# Patient Record
Sex: Female | Born: 1935 | Race: White | Hispanic: No | Marital: Married | Smoking: Never smoker
Health system: Southern US, Community
[De-identification: ages and names within clinical notes are randomized; demographics above are authoritative.]

## PROBLEM LIST (undated history)

## (undated) ENCOUNTER — Emergency Department (HOSPITAL_COMMUNITY): Disposition: A | Discharge status: Home / Self Care | Payer: Medicare Other

## (undated) DIAGNOSIS — I10 Essential (primary) hypertension: Secondary | ICD-10-CM

## (undated) DIAGNOSIS — I519 Heart disease, unspecified: Secondary | ICD-10-CM

## (undated) DIAGNOSIS — I639 Cerebral infarction, unspecified: Secondary | ICD-10-CM

## (undated) DIAGNOSIS — K589 Irritable bowel syndrome without diarrhea: Secondary | ICD-10-CM

## (undated) DIAGNOSIS — G459 Transient cerebral ischemic attack, unspecified: Secondary | ICD-10-CM

## (undated) HISTORY — DX: Heart disease, unspecified: I51.9

## (undated) HISTORY — DX: Cerebral infarction, unspecified: I63.9

## (undated) HISTORY — PX: BUNIONECTOMY: SHX129

## (undated) HISTORY — DX: Irritable bowel syndrome, unspecified: K58.9

---

## 1976-04-04 HISTORY — PX: ABDOMINAL HYSTERECTOMY: SHX81

## 1986-04-04 HISTORY — PX: LAPAROSCOPIC SALPINGO OOPHERECTOMY: SHX5927

## 1998-12-10 ENCOUNTER — Other Ambulatory Visit: Admission: RE | Admit: 1998-12-10 | Discharge: 1998-12-10 | Payer: Self-pay | Admitting: Gynecology

## 1999-11-25 ENCOUNTER — Encounter: Payer: Self-pay | Admitting: Gynecology

## 1999-11-25 ENCOUNTER — Encounter: Admission: RE | Admit: 1999-11-25 | Discharge: 1999-11-25 | Payer: Self-pay | Admitting: Gynecology

## 2000-01-28 ENCOUNTER — Other Ambulatory Visit: Admission: RE | Admit: 2000-01-28 | Discharge: 2000-01-28 | Payer: Self-pay | Admitting: Gynecology

## 2000-07-27 ENCOUNTER — Encounter: Payer: Self-pay | Admitting: Physician Assistant

## 2000-11-30 ENCOUNTER — Encounter: Admission: RE | Admit: 2000-11-30 | Discharge: 2000-11-30 | Payer: Self-pay | Admitting: Gynecology

## 2000-11-30 ENCOUNTER — Encounter: Payer: Self-pay | Admitting: Gynecology

## 2002-01-16 ENCOUNTER — Encounter: Payer: Self-pay | Admitting: Gynecology

## 2002-01-16 ENCOUNTER — Encounter: Admission: RE | Admit: 2002-01-16 | Discharge: 2002-01-16 | Payer: Self-pay | Admitting: Gynecology

## 2002-02-15 ENCOUNTER — Other Ambulatory Visit: Admission: RE | Admit: 2002-02-15 | Discharge: 2002-02-15 | Payer: Self-pay | Admitting: Gynecology

## 2003-01-21 ENCOUNTER — Encounter: Payer: Self-pay | Admitting: Gynecology

## 2003-01-21 ENCOUNTER — Encounter: Admission: RE | Admit: 2003-01-21 | Discharge: 2003-01-21 | Payer: Self-pay | Admitting: Gynecology

## 2004-02-13 ENCOUNTER — Encounter: Admission: RE | Admit: 2004-02-13 | Discharge: 2004-02-13 | Payer: Self-pay | Admitting: Gynecology

## 2004-06-10 ENCOUNTER — Other Ambulatory Visit: Admission: RE | Admit: 2004-06-10 | Discharge: 2004-06-10 | Payer: Self-pay | Admitting: Gynecology

## 2005-04-14 ENCOUNTER — Encounter: Admission: RE | Admit: 2005-04-14 | Discharge: 2005-04-14 | Payer: Self-pay | Admitting: Gynecology

## 2006-05-18 ENCOUNTER — Encounter: Admission: RE | Admit: 2006-05-18 | Discharge: 2006-05-18 | Payer: Self-pay | Admitting: Gynecology

## 2006-07-28 ENCOUNTER — Other Ambulatory Visit: Admission: RE | Admit: 2006-07-28 | Discharge: 2006-07-28 | Payer: Self-pay | Admitting: Gynecology

## 2007-05-28 ENCOUNTER — Encounter: Admission: RE | Admit: 2007-05-28 | Discharge: 2007-05-28 | Payer: Self-pay | Admitting: Family Medicine

## 2008-07-17 ENCOUNTER — Encounter: Admission: RE | Admit: 2008-07-17 | Discharge: 2008-07-17 | Payer: Self-pay | Admitting: Gynecology

## 2009-04-27 ENCOUNTER — Inpatient Hospital Stay (HOSPITAL_COMMUNITY): Admission: EM | Admit: 2009-04-27 | Discharge: 2009-04-29 | Payer: Self-pay | Admitting: Emergency Medicine

## 2009-04-28 ENCOUNTER — Encounter (INDEPENDENT_AMBULATORY_CARE_PROVIDER_SITE_OTHER): Payer: Self-pay | Admitting: Internal Medicine

## 2009-04-28 ENCOUNTER — Ambulatory Visit: Payer: Self-pay | Admitting: Vascular Surgery

## 2009-07-21 ENCOUNTER — Encounter: Admission: RE | Admit: 2009-07-21 | Discharge: 2009-07-21 | Payer: Self-pay | Admitting: Gynecology

## 2010-06-21 LAB — CBC
HCT: 35.5 % — ABNORMAL LOW (ref 36.0–46.0)
HCT: 39.9 % (ref 36.0–46.0)
Hemoglobin: 12.1 g/dL (ref 12.0–15.0)
MCHC: 34.1 g/dL (ref 30.0–36.0)
MCHC: 35.1 g/dL (ref 30.0–36.0)
MCV: 90.2 fL (ref 78.0–100.0)
MCV: 90.6 fL (ref 78.0–100.0)
Platelets: 161 10*3/uL (ref 150–400)
Platelets: 201 10*3/uL (ref 150–400)
RBC: 4.43 MIL/uL (ref 3.87–5.11)
RDW: 13.4 % (ref 11.5–15.5)
WBC: 6.3 10*3/uL (ref 4.0–10.5)

## 2010-06-21 LAB — BASIC METABOLIC PANEL
BUN: 15 mg/dL (ref 6–23)
BUN: 17 mg/dL (ref 6–23)
CO2: 27 mEq/L (ref 19–32)
CO2: 30 mEq/L (ref 19–32)
Calcium: 8.4 mg/dL (ref 8.4–10.5)
Calcium: 9.5 mg/dL (ref 8.4–10.5)
Chloride: 103 mEq/L (ref 96–112)
Chloride: 109 mEq/L (ref 96–112)
Creatinine, Ser: 0.87 mg/dL (ref 0.4–1.2)
Creatinine, Ser: 1.17 mg/dL (ref 0.4–1.2)
GFR calc Af Amer: 53 mL/min — ABNORMAL LOW (ref >60)
GFR calc non Af Amer: 59 mL/min — ABNORMAL LOW (ref >60)
GFR calc non Af Amer: >60 mL/min (ref >60)
Glucose, Bld: 95 mg/dL (ref 70–99)
Glucose, Bld: 96 mg/dL (ref 70–99)
Potassium: 3.7 mEq/L (ref 3.5–5.1)
Potassium: 3.8 mEq/L (ref 3.5–5.1)
Potassium: 4 mEq/L (ref 3.5–5.1)
Sodium: 138 mEq/L (ref 135–145)
Sodium: 139 mEq/L (ref 135–145)

## 2010-06-21 LAB — URINALYSIS, ROUTINE W REFLEX MICROSCOPIC
Bilirubin Urine: NEGATIVE
Ketones, ur: NEGATIVE mg/dL
Nitrite: NEGATIVE
Protein, ur: NEGATIVE mg/dL
Urobilinogen, UA: 0.2 mg/dL (ref 0.0–1.0)
pH: 6.5 (ref 5.0–8.0)

## 2010-06-21 LAB — DIFFERENTIAL
Eosinophils Absolute: 0.2 10*3/uL (ref 0.0–0.7)
Eosinophils Relative: 3 % (ref 0–5)
Lymphs Abs: 1.5 10*3/uL (ref 0.7–4.0)
Neutro Abs: 4.4 10*3/uL (ref 1.7–7.7)
Neutrophils Relative %: 65 % (ref 43–77)

## 2010-06-21 LAB — CK TOTAL AND CKMB (NOT AT ARMC)
CK, MB: 1.9 ng/mL (ref 0.3–4.0)
CK, MB: 3.1 ng/mL (ref 0.3–4.0)
Relative Index: 1.8 (ref 0.0–2.5)
Relative Index: 2.9 — ABNORMAL HIGH (ref 0.0–2.5)
Total CK: 101 U/L (ref 7–177)
Total CK: 105 U/L (ref 7–177)
Total CK: 107 U/L (ref 7–177)

## 2010-06-21 LAB — HEPATIC FUNCTION PANEL
Albumin: 3.3 g/dL — ABNORMAL LOW (ref 3.5–5.2)
Alkaline Phosphatase: 46 U/L (ref 39–117)
Indirect Bilirubin: 0.2 mg/dL — ABNORMAL LOW (ref 0.3–0.9)
Total Protein: 5.8 g/dL — ABNORMAL LOW (ref 6.0–8.3)

## 2010-06-21 LAB — POCT CARDIAC MARKERS: Myoglobin, poc: 108 ng/mL (ref 12–200)

## 2010-06-21 LAB — TROPONIN I
Troponin I: 0.01 ng/mL (ref 0.00–0.06)
Troponin I: 0.01 ng/mL (ref 0.00–0.06)
Troponin I: 0.03 ng/mL (ref 0.00–0.06)

## 2010-06-21 LAB — LIPID PANEL
Cholesterol: 144 mg/dL (ref 0–200)
LDL Cholesterol: 66 mg/dL (ref 0–99)
Total CHOL/HDL Ratio: 2.7 RATIO
Triglycerides: 121 mg/dL (ref <150)
VLDL: 24 mg/dL (ref 0–40)

## 2010-06-21 LAB — HEMOGLOBIN A1C: Mean Plasma Glucose: 123 mg/dL

## 2010-06-21 LAB — VITAMIN B12: Vitamin B-12: 164 pg/mL — ABNORMAL LOW (ref 211–911)

## 2010-08-31 ENCOUNTER — Other Ambulatory Visit: Payer: Self-pay | Admitting: Gynecology

## 2010-09-01 ENCOUNTER — Other Ambulatory Visit: Payer: Self-pay | Admitting: Gynecology

## 2010-09-01 DIAGNOSIS — N63 Unspecified lump in unspecified breast: Secondary | ICD-10-CM

## 2010-09-07 ENCOUNTER — Ambulatory Visit
Admission: RE | Admit: 2010-09-07 | Discharge: 2010-09-07 | Disposition: A | Discharge status: Home / Self Care | Payer: Medicare Other | Source: Ambulatory Visit | Attending: Gynecology | Admitting: Gynecology

## 2010-09-07 DIAGNOSIS — N63 Unspecified lump in unspecified breast: Secondary | ICD-10-CM

## 2011-10-10 ENCOUNTER — Other Ambulatory Visit: Payer: Self-pay | Admitting: Gynecology

## 2011-10-10 DIAGNOSIS — Z1231 Encounter for screening mammogram for malignant neoplasm of breast: Secondary | ICD-10-CM

## 2011-10-21 ENCOUNTER — Ambulatory Visit
Admission: RE | Admit: 2011-10-21 | Discharge: 2011-10-21 | Disposition: A | Discharge status: Home / Self Care | Payer: Medicare Other | Source: Ambulatory Visit | Attending: Gynecology | Admitting: Gynecology

## 2011-10-21 DIAGNOSIS — Z1231 Encounter for screening mammogram for malignant neoplasm of breast: Secondary | ICD-10-CM

## 2012-09-17 ENCOUNTER — Other Ambulatory Visit: Payer: Self-pay

## 2012-09-17 DIAGNOSIS — Z1231 Encounter for screening mammogram for malignant neoplasm of breast: Secondary | ICD-10-CM

## 2012-10-29 ENCOUNTER — Ambulatory Visit
Admission: RE | Admit: 2012-10-29 | Discharge: 2012-10-29 | Disposition: A | Discharge status: Home / Self Care | Payer: Medicare Other | Source: Ambulatory Visit

## 2012-10-29 DIAGNOSIS — Z1231 Encounter for screening mammogram for malignant neoplasm of breast: Secondary | ICD-10-CM

## 2013-01-28 ENCOUNTER — Emergency Department (HOSPITAL_COMMUNITY): Payer: Medicare Other

## 2013-01-28 ENCOUNTER — Observation Stay (HOSPITAL_COMMUNITY): Payer: Medicare Other

## 2013-01-28 ENCOUNTER — Observation Stay (HOSPITAL_COMMUNITY)
Admission: EM | Admit: 2013-01-28 | Discharge: 2013-01-30 | Disposition: A | Discharge status: Home / Self Care | Payer: Medicare Other | Attending: Internal Medicine | Admitting: Internal Medicine

## 2013-01-28 ENCOUNTER — Encounter (HOSPITAL_COMMUNITY): Payer: Self-pay | Admitting: Emergency Medicine

## 2013-01-28 DIAGNOSIS — Z8673 Personal history of transient ischemic attack (TIA), and cerebral infarction without residual deficits: Secondary | ICD-10-CM | POA: Insufficient documentation

## 2013-01-28 DIAGNOSIS — Z79899 Other long term (current) drug therapy: Secondary | ICD-10-CM | POA: Insufficient documentation

## 2013-01-28 DIAGNOSIS — E785 Hyperlipidemia, unspecified: Secondary | ICD-10-CM | POA: Diagnosis present

## 2013-01-28 DIAGNOSIS — R200 Anesthesia of skin: Secondary | ICD-10-CM | POA: Diagnosis present

## 2013-01-28 DIAGNOSIS — G459 Transient cerebral ischemic attack, unspecified: Principal | ICD-10-CM | POA: Diagnosis present

## 2013-01-28 DIAGNOSIS — I10 Essential (primary) hypertension: Secondary | ICD-10-CM | POA: Diagnosis present

## 2013-01-28 DIAGNOSIS — R209 Unspecified disturbances of skin sensation: Secondary | ICD-10-CM | POA: Insufficient documentation

## 2013-01-28 DIAGNOSIS — R262 Difficulty in walking, not elsewhere classified: Secondary | ICD-10-CM | POA: Insufficient documentation

## 2013-01-28 HISTORY — DX: Essential (primary) hypertension: I10

## 2013-01-28 HISTORY — DX: Transient cerebral ischemic attack, unspecified: G45.9

## 2013-01-28 LAB — POCT I-STAT TROPONIN I: Troponin i, poc: 0 ng/mL (ref 0.00–0.08)

## 2013-01-28 LAB — PROTIME-INR
INR: 1.02 (ref 0.00–1.49)
Prothrombin Time: 13.2 seconds (ref 11.6–15.2)

## 2013-01-28 LAB — GLUCOSE, CAPILLARY: Glucose-Capillary: 143 mg/dL — ABNORMAL HIGH (ref 70–99)

## 2013-01-28 LAB — COMPREHENSIVE METABOLIC PANEL
ALT: 20 U/L (ref 0–35)
AST: 22 U/L (ref 0–37)
Alkaline Phosphatase: 73 U/L (ref 39–117)
Calcium: 10 mg/dL (ref 8.4–10.5)
Potassium: 4.1 mEq/L (ref 3.5–5.1)
Sodium: 139 mEq/L (ref 135–145)
Total Protein: 7.4 g/dL (ref 6.0–8.3)

## 2013-01-28 LAB — CBC
HCT: 39.1 % (ref 36.0–46.0)
Hemoglobin: 13.4 g/dL (ref 12.0–15.0)
MCH: 30.1 pg (ref 26.0–34.0)
MCHC: 34.3 g/dL (ref 30.0–36.0)
MCV: 90.1 fL (ref 78.0–100.0)
Platelets: 202 10*3/uL (ref 150–400)
Platelets: 170 10*3/uL (ref 150–400)
RBC: 4.65 MIL/uL (ref 3.87–5.11)
RBC: 4.36 MIL/uL (ref 3.87–5.11)
RDW: 13.6 % (ref 11.5–15.5)
WBC: 6.5 10*3/uL (ref 4.0–10.5)

## 2013-01-28 LAB — DIFFERENTIAL
Basophils Absolute: 0 10*3/uL (ref 0.0–0.1)
Basophils Relative: 0 % (ref 0–1)
Eosinophils Absolute: 0.1 10*3/uL (ref 0.0–0.7)
Eosinophils Relative: 2 % (ref 0–5)
Lymphocytes Relative: 27 % (ref 12–46)
Neutrophils Relative %: 61 % (ref 43–77)

## 2013-01-28 LAB — RAPID URINE DRUG SCREEN, HOSP PERFORMED
Barbiturates: NOT DETECTED
Benzodiazepines: NOT DETECTED
Tetrahydrocannabinol: NOT DETECTED

## 2013-01-28 LAB — CREATININE, SERUM
Creatinine, Ser: 0.87 mg/dL (ref 0.50–1.10)
GFR calc Af Amer: 67 mL/min — ABNORMAL LOW (ref >90)
GFR calc non Af Amer: 58 mL/min — ABNORMAL LOW (ref >90)

## 2013-01-28 MED ORDER — GUAIFENESIN ER 600 MG PO TB12
600.0000 mg | ORAL_TABLET | Freq: Two times a day (BID) | ORAL | Status: DC | PRN
  Administered 2013-01-28 – 2013-01-29 (×2): 600 mg via ORAL
  Filled 2013-01-28 (×2): qty 1

## 2013-01-28 MED ORDER — ATORVASTATIN CALCIUM 10 MG PO TABS
10.0000 mg | ORAL_TABLET | Freq: Every day | ORAL | Status: DC
  Administered 2013-01-28 – 2013-01-29 (×2): 10 mg via ORAL
  Filled 2013-01-28 (×3): qty 1

## 2013-01-28 MED ORDER — LORAZEPAM 2 MG/ML IJ SOLN
1.0000 mg | Freq: Once | INTRAMUSCULAR | Status: AC
  Administered 2013-01-28: 1 mg via INTRAVENOUS
  Filled 2013-01-28: qty 1

## 2013-01-28 MED ORDER — CLOPIDOGREL BISULFATE 75 MG PO TABS
75.0000 mg | ORAL_TABLET | Freq: Every day | ORAL | Status: DC
  Administered 2013-01-28: 75 mg via ORAL
  Filled 2013-01-28 (×2): qty 1

## 2013-01-28 MED ORDER — HYDROCHLOROTHIAZIDE 12.5 MG PO CAPS
12.5000 mg | ORAL_CAPSULE | Freq: Every day | ORAL | Status: DC
  Filled 2013-01-28 (×2): qty 1

## 2013-01-28 MED ORDER — VITAMIN D 50 MCG (2000 UT) PO CAPS: 1.0000 | ORAL_CAPSULE | Freq: Every day | ORAL | Status: DC

## 2013-01-28 MED ORDER — VALSARTAN-HYDROCHLOROTHIAZIDE 320-12.5 MG PO TABS: 1.0000 | ORAL_TABLET | Freq: Every day | ORAL | Status: DC

## 2013-01-28 MED ORDER — IRBESARTAN 300 MG PO TABS
300.0000 mg | ORAL_TABLET | Freq: Every day | ORAL | Status: DC
  Filled 2013-01-28 (×2): qty 1

## 2013-01-28 MED ORDER — ENOXAPARIN SODIUM 40 MG/0.4ML ~~LOC~~ SOLN
40.0000 mg | SUBCUTANEOUS | Status: DC
  Administered 2013-01-28 – 2013-01-29 (×2): 40 mg via SUBCUTANEOUS
  Filled 2013-01-28 (×3): qty 0.4

## 2013-01-28 MED ORDER — VITAMIN D3 25 MCG (1000 UNIT) PO TABS
2000.0000 [IU] | ORAL_TABLET | Freq: Every day | ORAL | Status: DC
  Administered 2013-01-28 – 2013-01-29 (×2): 2000 [IU] via ORAL
  Filled 2013-01-28 (×3): qty 2

## 2013-01-28 MED ORDER — LORAZEPAM 0.5 MG PO TABS: 0.5000 mg | ORAL_TABLET | Freq: Two times a day (BID) | ORAL | Status: DC | PRN

## 2013-01-28 NOTE — ED Provider Notes (Signed)
79 year old female woke up with numbness in the right arm and right leg which has gradually faded to the point where she states there is no numbness present whatsoever at this time. She also initially had some difficulty walking which has resolved. She denies any weakness, headache, vertigo, nausea. She has a history of TIA in the past and that TIA involved speech. She denies chest pain, heaviness, tightness, pressure. She had been on aspirin when she had her TIA was switched to Plavix and she has been compliant with taking Plavix. On exam, mental status is normal, cranial nerves are intact, neck is supple without carotid bruits., Lungs are clear, heart has regular rate rhythm. Neurologic exam is unremarkable. Cranial nerves are normal, speech is normal, there no objective sensory deficits, there no objective motor deficits, there is no pronator drift and there is no extinction on double simultaneous stimulation. Gait is normal. She apparently has had a repeat TIA. Old records are reviewed and it has been almost 4 years since her last evaluation. She will need repeat MRI and MRA as well as carotid duplex study. I'm not sure if her echocardiogram will need to be repeated. She will be admitted on the medicine service.  I saw and evaluated the patient, reviewed the resident's note and I agree with the findings and plan.  EKG Interpretation     Ventricular Rate:  59 PR Interval:  136 QRS Duration: 86 QT Interval:  418 QTC Calculation: 413 R Axis:   30 Text Interpretation:  Sinus bradycardia Otherwise normal ECG When compared with ECG of 04/27/2009, No significant change was found              Dione Booze, MD 01/28/13 1700

## 2013-01-28 NOTE — H&P (Signed)
[facility] History and Physical  TERSA FOTOPOULOS Barker:[id number] DOB: [birth year] DOA: 01/28/2013  Referring physician: PCP: Londell Moh, MD  Specialists:   Chief Complaint: Extremity numbness  HPI: Valerie Barker is a 79 y.o. female with a past medical history of hypertension, dyslipidemia, and TIA who was referred to the emergency department today by her primary care provider. She states being in her usual state health, waking up this morning at approximately 5 AM where she noted numbness involving right upper extremity as well as right lower extremity and to a lesser extent left hand. She denies focal weakness, slurred speech, difficulty getting words out, unsteady gait, chest pain, palpitations, syncope presyncope fevers or chills. Patient did report associated lightheadedness. Symptoms gradually resolved over the course of the morning, as she is asymptomatic by the time she reached the emergency department. She was seen at her primary care physician's office this morning who made the recommendation for patient to be evaluated in the ER.                                                                  Review of Systems: The patient denies anorexia, fever, weight loss, vision loss, decreased hearing, hoarseness, chest pain, syncope, dyspnea on exertion, peripheral edema, balance deficits, hemoptysis, abdominal pain, melena, hematochezia, severe indigestion/heartburn, hematuria, incontinence, genital sores, muscle weakness, suspicious skin lesions, transient blindness, difficulty walking, depression, unusual weight change, abnormal bleeding, enlarged lymph nodes, angioedema, and breast masses.    Past Medical History  Diagnosis Date  . Hypertension   . TIA (transient ischemic attack)    History reviewed. No pertinent past surgical history. Social History:  reports that she has never smoked. She does not have any smokeless tobacco history on file. She reports that she does not  drink alcohol or use illicit drugs. Patient is married, resides in a retirement community. She does not drink or smoke. She is the primary caregiver of her husband who has a history of dementia  No Known Allergies  Family History She reports that both parents had strokes, and her mother had recurrent TIAs. Also reports a family history of hypertension  Prior to Admission medications   Medication Sig Start Date End Date Taking? Authorizing Provider  aspirin 325 MG tablet Take 325 mg by mouth daily as needed for pain.   Yes Historical Provider, MD  atorvastatin (LIPITOR) 10 MG tablet Take 10 mg by mouth daily.   Yes Historical Provider, MD  Biotin 5000 MCG CAPS Take 1 capsule by mouth daily.   Yes Historical Provider, MD  Calcium Carb-Cholecalciferol (CALCIUM 600 + D) 600-200 MG-UNIT TABS Take 1 tablet by mouth 2 (two) times daily.   Yes Historical Provider, MD  Cholecalciferol (VITAMIN D) 2000 UNITS CAPS Take 1 capsule by mouth daily.   Yes Historical Provider, MD  clopidogrel (PLAVIX) 75 MG tablet Take 75 mg by mouth daily.   Yes Historical Provider, MD  guaiFENesin (MUCINEX) 600 MG 12 hr tablet Take 1,200 mg by mouth daily as needed for congestion.   Yes Historical Provider, MD  Multiple Vitamin (MULTIVITAMIN) capsule Take 1 capsule by mouth daily.   Yes Historical Provider, MD  valsartan-hydrochlorothiazide (DIOVAN-HCT) 320-12.5 MG per tablet Take 1 tablet by mouth daily.   Yes Historical Provider,  MD  zolpidem (AMBIEN) 10 MG tablet Take 10 mg by mouth at bedtime as needed for sleep.   Yes Historical Provider, MD   Physical Exam: Filed Vitals:   01/28/13 1615  BP: 120/58  Pulse: 62  Temp:   Resp: 17     General:  Patient is in no acute distress she is awake alert and oriented x3, reports resolution to her numbness  Eyes: Pupils are equal round reactive to light extraocular movement intact  Neck: Neck is supple symmetrical no jugular venous distention  Cardiovascular: Regular  rate and rhythm normal S1-S2  Respiratory: Lungs are clear to auscultation bilaterally no wheezing rhonchi oral  Abdomen: Soft nontender nondistended positive bowel  Skin: No rashes or lesions noted  Musculoskeletal: No edema, preserved range of motion function with  Psychiatric: Patient is awake alert and oriented times  Neurologic: She has a nonfocal neurologic examination, I did not note facial droop, tongue deviation, nuchal rigidity. She has 5 out of 5 muscle strength to bilateral upper and lower extremities without alteration to sensation. 2+ bilateral deep tendon reflexes, no cerebellar signs evident.  Labs on Admission:  Basic Metabolic Panel:  Recent Labs Lab 01/28/13 1319  NA 139  K 4.1  CL 102  CO2 26  GLUCOSE 118*  BUN 17  CREATININE 0.95  CALCIUM 10.0   Liver Function Tests:  Recent Labs Lab 01/28/13 1319  AST 22  ALT 20  ALKPHOS 73  BILITOT 0.4  PROT 7.4  ALBUMIN 4.3   No results found for this basename: LIPASE, AMYLASE,  in the last 168 hours No results found for this basename: AMMONIA,  in the last 168 hours CBC:  Recent Labs Lab 01/28/13 1319  WBC 6.5  NEUTROABS 3.9  HGB 14.0  HCT 41.9  MCV 90.1  PLT 202   Cardiac Enzymes:  Recent Labs Lab 01/28/13 1319  TROPONINI <0.30    BNP (last 3 results) No results found for this basename: PROBNP,  in the last 8760 hours CBG:  Recent Labs Lab 01/28/13 1405  GLUCAP 102*    Radiological Exams on Admission: Ct Head (brain) Wo Contrast  01/28/2013   CLINICAL DATA:  Right upper extremity weakness and numbness  EXAM: CT HEAD WITHOUT CONTRAST  TECHNIQUE: Contiguous axial images were obtained from the base of the skull through the vertex without intravenous contrast. Study was obtained within 24 hr of patient's arrival at the emergency department.  COMPARISON:  Brain MRI April 27, 2009 and brain CT April 27, 2009  FINDINGS: There is mild diffuse atrophy. There is no mass, hemorrhage,  extra-axial fluid collection, or midline shift. There is extensive small vessel disease throughout the centra semiovale bilaterally, a stable finding. There is no new gray-white compartment lesion. There is no demonstrable acute infarct. Bony calvarium appears intact. The mastoid air cells are clear.  IMPRESSION: Mild atrophy with extensive supratentorial small vessel disease. No intracranial mass, hemorrhage, or acute appearing infarct.   Electronically Signed   By: Bretta Bang M.D.   On: 01/28/2013 14:42    EKG: Independently reviewed. EKG showing sinus rhythm  Assessment/Plan Active Problems:   TIA (transient ischemic attack)   Dyslipidemia   HTN (hypertension)   Arm numbness   1. Possible TIA. Patient complaining of numbness involving right upper right lower extremity, however did mention having numbness involving her left hand as well which would be little more unusual. Will admit her to telemetry, place her on the TIA protocol. Start Plavix any 75  mg by mouth daily. Continue statin therapy, check a fasting lipid panel. Obtain MRI, MRA, 2-D echo and carotid Dopplers. 2. Hypertension. Blood pressures are stable, we'll continue her home regimen with Diovan/hydrochlorothiazide 320/12.5. She had a blood pressure 120/58 in the emergency department. 3. Dyslipidemia. Continue statin therapy, check a fasting lipid panel 4. DVT prophylaxis. Lovenox   Code Status: Full code Family Communication: Plan discussed with her son who was present at bedside Disposition Plan: Place patient in 29 observation, symptoms resolving by the time she reached the emergency department, I do not anticipate her requiring greater than 2 night hospitalization  Time spent: 60 minutes  Jeralyn Bennett [facility] Pager [phone]  If 7PM-7AM, please contact night-coverage [email or web address] Password [facility] 01/28/2013, 5:16 PM

## 2013-01-28 NOTE — ED Notes (Signed)
Pt sent from PCP woke up this am with right arm numbness and pain that is somewhat improved but some tingling still present; pt sts went into right leg and some left hand numbness also; pt sts feels "funny" in head but denies pain

## 2013-01-28 NOTE — ED Notes (Signed)
RN stroke swallow screen completed at 1433.

## 2013-01-28 NOTE — ED Provider Notes (Signed)
CSN: [id number]     Arrival date & time 01/28/13  1307 History   First MD Initiated Contact with Patient 01/28/13 1459     Chief Complaint  Patient presents with  . Numbness    The history is provided by the patient. No language interpreter was used.   Ms. Spadoni is a 79 y.o. Caucasian female with past medical history of TIA approximately 4 years ago who comes emergency department today with numbness of her right side. She woke this morning at 5:30 AM with numbness to right arm which then progressed to include her right leg. She felt that it was just from sleeping is result she did not seek evaluation at that time period of approximately 2 hours she called 911. At that time her vital signs were normal.  She was advised to the emergency department however she refused. The numbness persisted as a result she went to her primary care doctor around 11:30 this afternoon. At that time the numbness had resolved however he was concerned history of TIA that she should be evaluated in the ED.  In addition to the numbness Ms. Sok had some dizziness. She stated that she attempted to walk at that time and was unable to walk in a straight line. She had no weakness. Symptoms have since resolved.   Past Medical History  Diagnosis Date  . Hypertension   . TIA (transient ischemic attack)    History reviewed. No pertinent past surgical history. History reviewed. No pertinent family history. History  Substance Use Topics  . Smoking status: Never Smoker   . Smokeless tobacco: Not on file  . Alcohol Use: No   OB History   Grav Para Term Preterm Abortions TAB SAB Ect Mult Living                 Review of Systems  Constitutional: Negative for fever and chills.  Respiratory: Negative for cough and shortness of breath.   Gastrointestinal: Negative for nausea, vomiting, diarrhea and constipation.  Genitourinary: Negative for dysuria, urgency and frequency.  Neurological: Positive for dizziness and  numbness. Negative for seizures and weakness.  All other systems reviewed and are negative.    Allergies  Review of patient's allergies indicates no known allergies.  Home Medications   No current outpatient prescriptions on file. BP 103/43  Pulse 72  Temp(Src) 97.3 F (36.3 C) (Oral)  Resp 20  Ht 4\' 11"  (1.499 m)  Wt 133 lb (60.328 kg)  BMI 26.85 kg/m2  SpO2 100% Physical Exam  Nursing note and vitals reviewed. Constitutional: She is oriented to person, place, and time. She appears well-developed and well-nourished. No distress.  HENT:  Head: Normocephalic and atraumatic.  Eyes: Pupils are equal, round, and reactive to light.  Neck: Normal range of motion.  Cardiovascular: Normal rate, regular rhythm, normal heart sounds and intact distal pulses.   Pulmonary/Chest: Effort normal. No respiratory distress. She has no wheezes. She exhibits no tenderness.  Abdominal: Soft. Bowel sounds are normal. She exhibits no distension. There is no tenderness. There is no rebound and no guarding.  Neurological: She is alert and oriented to person, place, and time. She has normal strength. No cranial nerve deficit or sensory deficit. She exhibits normal muscle tone. Coordination and gait normal.  Strength 5/5 bilateral upper and lower extremities.  Sensation intact x4 extremities.  Numbness in the fingertips of the right hand.  Normal gait with no ataxia.    Skin: Skin is warm and dry.  ED Course  Procedures (including critical care time) Labs Review Labs Reviewed  COMPREHENSIVE METABOLIC PANEL - Abnormal; Notable for the following:    Glucose, Bld 118 (*)    GFR calc non Af Amer 52 (*)    GFR calc Af Amer 60 (*)    All other components within normal limits  GLUCOSE, CAPILLARY - Abnormal; Notable for the following:    Glucose-Capillary 102 (*)    All other components within normal limits  CREATININE, SERUM - Abnormal; Notable for the following:    GFR calc non Af Amer 58 (*)    GFR  calc Af Amer 67 (*)    All other components within normal limits  GLUCOSE, CAPILLARY - Abnormal; Notable for the following:    Glucose-Capillary 143 (*)    All other components within normal limits  PROTIME-INR  APTT  CBC  DIFFERENTIAL  TROPONIN I  URINE RAPID DRUG SCREEN (HOSP PERFORMED)  CBC  HEMOGLOBIN A1C  LIPID PANEL  POCT I-STAT TROPONIN I   Imaging Review Ct Head (brain) Wo Contrast  01/28/2013   CLINICAL DATA:  Right upper extremity weakness and numbness  EXAM: CT HEAD WITHOUT CONTRAST  TECHNIQUE: Contiguous axial images were obtained from the base of the skull through the vertex without intravenous contrast. Study was obtained within 24 hr of patient's arrival at the emergency department.  COMPARISON:  Brain MRI April 27, 2009 and brain CT April 27, 2009  FINDINGS: There is mild diffuse atrophy. There is no mass, hemorrhage, extra-axial fluid collection, or midline shift. There is extensive small vessel disease throughout the centra semiovale bilaterally, a stable finding. There is no new gray-white compartment lesion. There is no demonstrable acute infarct. Bony calvarium appears intact. The mastoid air cells are clear.  IMPRESSION: Mild atrophy with extensive supratentorial small vessel disease. No intracranial mass, hemorrhage, or acute appearing infarct.   Electronically Signed   By: Bretta Bang M.D.   On: 01/28/2013 14:42   Mr Brain Wo Contrast  01/28/2013   CLINICAL DATA:  Numbness in the arms. Dry hacking cough. History of hypertension and dyslipidemia.  EXAM: MRI HEAD WITHOUT CONTRAST  MRA HEAD WITHOUT CONTRAST  TECHNIQUE: Multiplanar, multiecho pulse sequences of the brain and surrounding structures were obtained without intravenous contrast. Angiographic images of the head were obtained using MRA technique without contrast.  COMPARISON:  CT 01/28/2013.  FINDINGS: MRI HEAD FINDINGS  The patient was unable to remain motionless for the exam. Small or subtle lesions  could be overlooked.  No evidence for acute infarction, hemorrhage, mass lesion, hydrocephalus, or extra-axial fluid. Moderate age-related atrophy. Extensive chronic microvascular ischemic change. Basal ganglia mineralization but no foci of chronic hemorrhage. Flow voids are maintained. No osseous findings. No remote large vessel infarct. Bilateral cataract extraction. No acute sinus or mastoid fluid. Good general agreement with prior CT.  MRA HEAD FINDINGS  Grossly patent internal carotid arteries, and basilar artery. Vertebrals are codominant. No flow-limiting intracranial stenosis, branch occlusion, or aneurysm is evident on this motion degraded exam.  IMPRESSION: MRI HEAD IMPRESSION  No acute intracranial findings are evident. There is advanced atrophy with chronic microvascular ischemic change.  MRA HEAD IMPRESSION  No intracranial flow reducing lesion is evident.   Electronically Signed   By: Davonna Belling M.D.   On: 01/28/2013 18:47   Mr Maxine Glenn Head/brain Wo Cm  01/28/2013   CLINICAL DATA:  Numbness in the arms. Dry hacking cough. History of hypertension and dyslipidemia.  EXAM: MRI HEAD WITHOUT CONTRAST  MRA HEAD WITHOUT CONTRAST  TECHNIQUE: Multiplanar, multiecho pulse sequences of the brain and surrounding structures were obtained without intravenous contrast. Angiographic images of the head were obtained using MRA technique without contrast.  COMPARISON:  CT 01/28/2013.  FINDINGS: MRI HEAD FINDINGS  The patient was unable to remain motionless for the exam. Small or subtle lesions could be overlooked.  No evidence for acute infarction, hemorrhage, mass lesion, hydrocephalus, or extra-axial fluid. Moderate age-related atrophy. Extensive chronic microvascular ischemic change. Basal ganglia mineralization but no foci of chronic hemorrhage. Flow voids are maintained. No osseous findings. No remote large vessel infarct. Bilateral cataract extraction. No acute sinus or mastoid fluid. Good general agreement with  prior CT.  MRA HEAD FINDINGS  Grossly patent internal carotid arteries, and basilar artery. Vertebrals are codominant. No flow-limiting intracranial stenosis, branch occlusion, or aneurysm is evident on this motion degraded exam.  IMPRESSION: MRI HEAD IMPRESSION  No acute intracranial findings are evident. There is advanced atrophy with chronic microvascular ischemic change.  MRA HEAD IMPRESSION  No intracranial flow reducing lesion is evident.   Electronically Signed   By: Davonna Belling M.D.   On: 01/28/2013 18:47    EKG Interpretation     Ventricular Rate:  59 PR Interval:  136 QRS Duration: 86 QT Interval:  418 QTC Calculation: 413 R Axis:   30 Text Interpretation:  Sinus bradycardia Otherwise normal ECG When compared with ECG of 04/27/2009, No significant change was found            MDM   Ms. Fiorillo is a 65 her old Caucasian female with past medical history of a TIA who comes emergency department today with right arm numbness which hasn't resolved. Physical exam as above. With no symptoms currently and last seen normal yesterday a code stroke was not called.  Initial workup included an i-STAT troponin, CMP, CBC, a PTT, PT INR, glucose, and a CT of her head. CT of the head demonstrated much vascular changes otherwise unremarkable. Troponin was negative. CMP unremarkable. CBC unremarkable. INR normal. A PTT 29. Ms. Kipper was felt to require admission to the hospital for TIA evaluation including MRI and MRA. MRI MRA were obtained in the emergency department. These demonstrated no acute changes and chronic microvascular changes. Ms. Newsom was admitted to the hospitalist service in stable condition. Labs and imaging reviewed by myself and considered and medical decision-making. Imaging was interpreted by radiology. Care was discussed with my attending Dr. Preston Fleeting.  1. TIA (transient ischemic attack)   2. Arm numbness   3. Dyslipidemia   4. HTN (hypertension)       Bethann Berkshire,  MD 01/29/13 0201

## 2013-01-29 DIAGNOSIS — G459 Transient cerebral ischemic attack, unspecified: Secondary | ICD-10-CM

## 2013-01-29 DIAGNOSIS — I517 Cardiomegaly: Secondary | ICD-10-CM

## 2013-01-29 LAB — LIPID PANEL
Cholesterol: 118 mg/dL (ref 0–200)
HDL: 66 mg/dL (ref >39)
LDL Cholesterol: 39 mg/dL (ref 0–99)
Total CHOL/HDL Ratio: 1.8 RATIO
Triglycerides: 66 mg/dL (ref <150)
VLDL: 13 mg/dL (ref 0–40)

## 2013-01-29 MED ORDER — CLOPIDOGREL BISULFATE 75 MG PO TABS
75.0000 mg | ORAL_TABLET | Freq: Every day | ORAL | Status: DC
  Administered 2013-01-29 – 2013-01-30 (×2): 75 mg via ORAL
  Filled 2013-01-29 (×2): qty 1

## 2013-01-29 NOTE — Progress Notes (Signed)
  Echocardiogram 2D Echocardiogram has been performed.  Valerie Barker FRANCES 01/29/2013, 11:43 AM

## 2013-01-29 NOTE — Progress Notes (Signed)
Physical Therapy Discharge Patient Details Name: Valerie Barker MRN: [id number] DOB: 1922/08/31 Today's Date: 01/29/2013 Time: 7829-5621 PT Time Calculation (min): 27 min  Patient discharged from PT services secondary to goals met and no further PT needs identified.  Please see latest therapy progress note for current level of functioning and progress toward goals.    Progress and discharge plan discussed with patient and/or caregiver: Patient/Caregiver agrees with plan  GP Functional Assessment Tool Used: clinical judgement Functional Limitation: Mobility: Walking and moving around Mobility: Walking and Moving Around Current Status ([id number]): At least 1 percent but less than 20 percent impaired, limited or restricted Mobility: Walking and Moving Around Goal Status [phone]): At least 1 percent but less than 20 percent impaired, limited or restricted Mobility: Walking and Moving Around Discharge Status [phone]): At least 1 percent but less than 20 percent impaired, limited or restricted   Leola Brazil, PT DPT  [phone]  01/29/2013, 2:51 PM

## 2013-01-29 NOTE — Progress Notes (Signed)
Bilateral carotid artery duplex:  1-39% ICA stenosis.  Vertebral artery flow is antegrade.     

## 2013-01-29 NOTE — Evaluation (Signed)
Physical Therapy Evaluation Patient Details Name: Valerie Barker MRN: [id number] DOB: 05-10-1922 Today's Date: 01/29/2013 Time: 4098-1191 PT Time Calculation (min): 27 min  PT Assessment / Plan / Recommendation History of Present Illness  HPI: Valerie Barker is a 79 y.o. female with a past medical history of hypertension, dyslipidemia, and TIA who was referred to the emergency department today by her primary care provider. She states being in her usual state health, waking up this morning at approximately 5 AM where she noted numbness involving right upper extremity as well as right lower extremity and to a lesser extent left hand. She denies focal weakness, slurred speech, difficulty getting words out, unsteady gait, chest pain, palpitations, syncope presyncope fevers or chills. Patient did report associated lightheadedness. Symptoms gradually resolved over the course of the morning, as she is asymptomatic by the time she reached the emergency department. She was seen at her primary care physician's office this morning who made the recommendation for patient to be evaluated in the ER  Clinical Impression  Patient demonstrates baseline independence with mobility and all MMT strength assessment with [facility] and symmetrical.  Patient able to ambulate without difficulty or assist.  Educated patient on signs and symptoms of TIA/Stroke and spoke at length with patient regarding 'BE FAST' given patients history.  Patient has no further acute needs. Will sign off.    PT Assessment  Patent does not need any further PT services    Follow Up Recommendations  No PT follow up    Does the patient have the potential to tolerate intense rehabilitation      Barriers to Discharge        Equipment Recommendations  None recommended by PT    Recommendations for Other Services     Frequency      Precautions / Restrictions Precautions Precautions: Fall   Pertinent Vitals/Pain No pain      Mobility  Bed Mobility Bed Mobility: Supine to Sit;Sitting - Scoot to Edge of Bed;Sit to Supine Supine to Sit: 7: Independent Sitting - Scoot to Edge of Bed: 7: Independent Sit to Supine: 7: Independent Transfers Transfers: Sit to Stand;Stand to Sit Sit to Stand: 7: Independent Stand to Sit: 7: Independent Ambulation/Gait Ambulation/Gait Assistance: 7: Independent Ambulation Distance (Feet): 400 Feet Assistive device: None Ambulation/Gait Assistance Details: slow but steady gait Gait Pattern: Within Functional Limits Gait velocity: decreased but steady        PT Goals(Current goals can be found in the care plan section) Acute Rehab PT Goals Patient Stated Goal: to go home today PT Goal Formulation: No goals set, d/c therapy  Visit Information  Last PT Received On: 01/29/13 Assistance Needed: +1 History of Present Illness: HPI: Valerie Barker is a 79 y.o. female with a past medical history of hypertension, dyslipidemia, and TIA who was referred to the emergency department today by her primary care provider. She states being in her usual state health, waking up this morning at approximately 5 AM where she noted numbness involving right upper extremity as well as right lower extremity and to a lesser extent left hand. She denies focal weakness, slurred speech, difficulty getting words out, unsteady gait, chest pain, palpitations, syncope presyncope fevers or chills. Patient did report associated lightheadedness. Symptoms gradually resolved over the course of the morning, as she is asymptomatic by the time she reached the emergency department. She was seen at her primary care physician's office this morning who made the recommendation for patient to be evaluated in the  ER       Prior Functioning  Home Living Family/patient expects to be discharged to:: Assisted living Living Arrangements: Spouse/significant other Home Equipment: Walker - 2 wheels;Cane - single point;Bedside commode;Shower  seat;Wheelchair - manual Prior Function Level of Independence: Independent Communication Communication: HOH (glasses) Dominant Hand: Right    Cognition  Cognition Arousal/Alertness: Awake/alert Behavior During Therapy: WFL for tasks assessed/performed Overall Cognitive Status: Within Functional Limits for tasks assessed    Extremity/Trunk Assessment Upper Extremity Assessment Upper Extremity Assessment: Overall WFL for tasks assessed Lower Extremity Assessment Lower Extremity Assessment: Overall WFL for tasks assessed   Balance Balance Balance Assessed: Yes High Level Balance High Level Balance Activites: Side stepping;Backward walking;Direction changes;Turns;Sudden stops;Head turns High Level Balance Comments: WFL  End of Session PT - End of Session Equipment Utilized During Treatment: Gait belt Activity Tolerance: Treatment limited secondary to agitation Patient left: in bed;with call bell/phone within reach;with bed alarm set Nurse Communication: Mobility status  GP     Fabio Asa 01/29/2013, 2:48 PM Charlotte Crumb, PT DPT  [phone]

## 2013-01-29 NOTE — Discharge Summary (Signed)
Physician Discharge Summary  Valerie Barker RUE:[id number] DOB: 1922-05-17 DOA: 01/28/2013  PCP: Londell Moh, MD  Admit date: 01/28/2013 Discharge date: 01/29/2013  Time spent:  40 minutes  Recommendations for Outpatient Follow-up:  1. TIA; this is patient's second TIA patient to continue Plavix will add aspirin 81 -Follow up with Dr.Limi Tonuzi (neurologist) on Wednesday next week -Followup with Dr Renne Crigler patient's PCP  2. HTN;  with an AHA guidelines continue current medication on discharge   3. HLD; continue home regimen    Discharge Diagnoses:  Active Problems:   TIA (transient ischemic attack)   Dyslipidemia   HTN (hypertension)   Arm numbness   Discharge Condition: Stable  Diet recommendation: Heart healthy    Filed Weights   01/28/13 1316 01/28/13 1317  Weight: 60.283 kg (132 lb 14.4 oz) 60.328 kg (133 lb)    History of present illness:  Valerie Barker is a 79 y.o. female with a past medical history of hypertension, dyslipidemia, and TIA who was referred to the emergency department today by her primary care provider. She states being in her usual state health, waking up this morning at approximately 5 AM where she noted numbness involving right upper extremity as well as right lower extremity and to a lesser extent left hand. She denies focal weakness, slurred speech, difficulty getting words out, unsteady gait, chest pain, palpitations, syncope presyncope fevers or chills. Patient did report associated lightheadedness. Symptoms gradually resolved over the course of the morning, as she is asymptomatic by the time she reached the emergency department. She was seen at her primary care physician's office this morning who made the recommendation for patient to be evaluated in the ER. TODAY states all neurological deficits have resolved   Procedures:  Echocardiogram 01/21/2013 - Left ventricle: The cavity size was normal. Wall thickness was increased in a  pattern of mild LVH.  -LVEF=65%. Wall motion was normal; there were no regional wall motion abnormalities. - Aortic valve: Sclerosis without stenosis. No significant regurgitation. - Right ventricle: Not able to assess RV function due to poor visualization. Not able to assess RV size due to poor Visualization.  Carotid Doppler 01/21/2013  Bilateral: 1-39% ICA stenosis. Vertebral artery flow is antegrade. Right: ICA/CCA ratio is 1.44. Left: ICA/CCA ratio is 0.84.   MRI/MRA 01/28/2013  MRI HEAD IMPRESSION  No acute intracranial findings are evident. There is advanced  atrophy with chronic microvascular ischemic change.  MRA HEAD IMPRESSION  No intracranial flow reducing lesion is evident      Discharge Exam: Filed Vitals:   01/28/13 2130 01/29/13 0105 01/29/13 0535 01/29/13 0936  BP: 102/45 103/43 126/57 99/41  Pulse: 60 72 83 56  Temp: 97.5 F (36.4 C) 97.3 F (36.3 C) 97.5 F (36.4 C) 97.9 F (36.6 C)  TempSrc: Oral Oral Oral Oral  Resp: 18 20 20 18   Height:      Weight:      SpO2: 98% 100% 99% 99%    General:A./O. x4, NAD Cardiovascular: Regular in rate, negative murmurs rubs or gallops, DP/PT pulses 2+ bilateral Respiratory: Clear to auscultation bilateral  Discharge Instructions     Medication List    ASK your doctor about these medications       aspirin 325 MG tablet  Take 325 mg by mouth daily as needed for pain.     atorvastatin 10 MG tablet  Commonly known as:  LIPITOR  Take 10 mg by mouth daily.     Biotin 5000 MCG Caps  Take 1 capsule by mouth daily.     CALCIUM 600 + D 600-200 MG-UNIT Tabs  Generic drug:  Calcium Carb-Cholecalciferol  Take 1 tablet by mouth 2 (two) times daily.     clopidogrel 75 MG tablet  Commonly known as:  PLAVIX  Take 75 mg by mouth daily.     guaiFENesin 600 MG 12 hr tablet  Commonly known as:  MUCINEX  Take 1,200 mg by mouth daily as needed for congestion.     multivitamin capsule  Take 1 capsule by mouth  daily.     valsartan-hydrochlorothiazide 320-12.5 MG per tablet  Commonly known as:  DIOVAN-HCT  Take 1 tablet by mouth daily.     Vitamin D 2000 UNITS Caps  Take 1 capsule by mouth daily.     zolpidem 10 MG tablet  Commonly known as:  AMBIEN  Take 10 mg by mouth at bedtime as needed for sleep.       No Known Allergies    The results of significant diagnostics from this hospitalization (including imaging, microbiology, ancillary and laboratory) are listed below for reference.    Significant Diagnostic Studies: Ct Head (brain) Wo Contrast  01/28/2013   CLINICAL DATA:  Right upper extremity weakness and numbness  EXAM: CT HEAD WITHOUT CONTRAST  TECHNIQUE: Contiguous axial images were obtained from the base of the skull through the vertex without intravenous contrast. Study was obtained within 24 hr of patient's arrival at the emergency department.  COMPARISON:  Brain MRI April 27, 2009 and brain CT April 27, 2009  FINDINGS: There is mild diffuse atrophy. There is no mass, hemorrhage, extra-axial fluid collection, or midline shift. There is extensive small vessel disease throughout the centra semiovale bilaterally, a stable finding. There is no new gray-white compartment lesion. There is no demonstrable acute infarct. Bony calvarium appears intact. The mastoid air cells are clear.  IMPRESSION: Mild atrophy with extensive supratentorial small vessel disease. No intracranial mass, hemorrhage, or acute appearing infarct.   Electronically Signed   By: Bretta Bang M.D.   On: 01/28/2013 14:42   Mr Brain Wo Contrast  01/28/2013   CLINICAL DATA:  Numbness in the arms. Dry hacking cough. History of hypertension and dyslipidemia.  EXAM: MRI HEAD WITHOUT CONTRAST  MRA HEAD WITHOUT CONTRAST  TECHNIQUE: Multiplanar, multiecho pulse sequences of the brain and surrounding structures were obtained without intravenous contrast. Angiographic images of the head were obtained using MRA technique  without contrast.  COMPARISON:  CT 01/28/2013.  FINDINGS: MRI HEAD FINDINGS  The patient was unable to remain motionless for the exam. Small or subtle lesions could be overlooked.  No evidence for acute infarction, hemorrhage, mass lesion, hydrocephalus, or extra-axial fluid. Moderate age-related atrophy. Extensive chronic microvascular ischemic change. Basal ganglia mineralization but no foci of chronic hemorrhage. Flow voids are maintained. No osseous findings. No remote large vessel infarct. Bilateral cataract extraction. No acute sinus or mastoid fluid. Good general agreement with prior CT.  MRA HEAD FINDINGS  Grossly patent internal carotid arteries, and basilar artery. Vertebrals are codominant. No flow-limiting intracranial stenosis, branch occlusion, or aneurysm is evident on this motion degraded exam.  IMPRESSION: MRI HEAD IMPRESSION  No acute intracranial findings are evident. There is advanced atrophy with chronic microvascular ischemic change.  MRA HEAD IMPRESSION  No intracranial flow reducing lesion is evident.   Electronically Signed   By: Davonna Belling M.D.   On: 01/28/2013 18:47   Mr Maxine Glenn Head/brain Wo Cm  01/28/2013   CLINICAL DATA:  Numbness in the arms. Dry hacking cough. History of hypertension and dyslipidemia.  EXAM: MRI HEAD WITHOUT CONTRAST  MRA HEAD WITHOUT CONTRAST  TECHNIQUE: Multiplanar, multiecho pulse sequences of the brain and surrounding structures were obtained without intravenous contrast. Angiographic images of the head were obtained using MRA technique without contrast.  COMPARISON:  CT 01/28/2013.  FINDINGS: MRI HEAD FINDINGS  The patient was unable to remain motionless for the exam. Small or subtle lesions could be overlooked.  No evidence for acute infarction, hemorrhage, mass lesion, hydrocephalus, or extra-axial fluid. Moderate age-related atrophy. Extensive chronic microvascular ischemic change. Basal ganglia mineralization but no foci of chronic hemorrhage. Flow voids  are maintained. No osseous findings. No remote large vessel infarct. Bilateral cataract extraction. No acute sinus or mastoid fluid. Good general agreement with prior CT.  MRA HEAD FINDINGS  Grossly patent internal carotid arteries, and basilar artery. Vertebrals are codominant. No flow-limiting intracranial stenosis, branch occlusion, or aneurysm is evident on this motion degraded exam.  IMPRESSION: MRI HEAD IMPRESSION  No acute intracranial findings are evident. There is advanced atrophy with chronic microvascular ischemic change.  MRA HEAD IMPRESSION  No intracranial flow reducing lesion is evident.   Electronically Signed   By: Davonna Belling M.D.   On: 01/28/2013 18:47    Microbiology: No results found for this or any previous visit (from the past 240 hour(s)).   Labs: Basic Metabolic Panel:  Recent Labs Lab 01/28/13 1319 01/28/13 2000  NA 139  --   K 4.1  --   CL 102  --   CO2 26  --   GLUCOSE 118*  --   BUN 17  --   CREATININE 0.95 0.87  CALCIUM 10.0  --    Liver Function Tests:  Recent Labs Lab 01/28/13 1319  AST 22  ALT 20  ALKPHOS 73  BILITOT 0.4  PROT 7.4  ALBUMIN 4.3   No results found for this basename: LIPASE, AMYLASE,  in the last 168 hours No results found for this basename: AMMONIA,  in the last 168 hours CBC:  Recent Labs Lab 01/28/13 1319 01/28/13 2000  WBC 6.5 5.8  NEUTROABS 3.9  --   HGB 14.0 13.4  HCT 41.9 39.1  MCV 90.1 89.7  PLT 202 170   Cardiac Enzymes:  Recent Labs Lab 01/28/13 1319  TROPONINI <0.30   BNP: BNP (last 3 results) No results found for this basename: PROBNP,  in the last 8760 hours CBG:  Recent Labs Lab 01/28/13 1405 01/28/13 2150 01/29/13 0631  GLUCAP 102* 143* 90       Signed:  Carolyne Littles, MD [facility] [phone] pager

## 2013-02-02 NOTE — Discharge Summary (Addendum)
Physician Discharge Summary  Valerie Barker ZOX:[id number] DOB: 11-08-1922 DOA: 01/28/2013  PCP: Londell Moh, MD  Admit date: 01/28/2013 Discharge date: 01/30/2013  Time spent: Less than 30 minutes  Recommendations for Outpatient Follow-up:  1. Dr. Merri Brunette, PCP in 1 week 2. Dr. Curt Bears, Neurology: Keep up prior appointment.  Discharge Diagnoses:  Active Problems:   TIA (transient ischemic attack)   Dyslipidemia   HTN (hypertension)   Arm numbness   Discharge Condition: Improved & Stable  Diet recommendation: Heart Healthy diet.  Filed Weights   01/28/13 1316 01/28/13 1317  Weight: 60.283 kg (132 lb 14.4 oz) 60.328 kg (133 lb)    History of present illness:  79 year old female with history of hypertension, dyslipidemia and TIA was referred to the emergency department by her PCP on 01/28/13 for evaluation of right sided numbness. This began at 5 AM on day of admission. She noted numbness involving right upper extremity and lower extremity and Prolixin the extent left hand. She denied focal weakness, slurred speech, difficulty getting out words, unsteady gait, chest pain, palpitations, syncope. She complained of some lightheadedness. Symptoms gradually resolved over the course of the morning. She was asymptomatic by the time she reached the ED.  Hospital Course:   Possible TIA - Patient completed TIA workup. Stroke was ruled out. - As stated above, her symptoms had resolved by the time she arrived in the ED and she did not have any further recurrence. - Patient had been on Plavix prior to admission. Discussed with Neuro Hospitalist on call who recommended continuing Plavix and did not recommend adding aspirin or changing her regimen.  Hypertension - Controlled  Dyslipidemia - Continue statins     Consultations:  None  Procedures:  None    Discharge Exam:  Complaints:  Patient denied complaints. She was dressed up and ready to leave stating  that her son would be by to pick her up at 9 AM on day of discharge.  Filed Vitals:   01/29/13 2136 01/30/13 0127 01/30/13 0608 01/30/13 0918  BP: 124/56 113/49 116/58 141/59  Pulse: 70 65 56 66  Temp: 97.7 F (36.5 C) 97.6 F (36.4 C) 97.6 F (36.4 C) 97.4 F (36.3 C)  TempSrc: Oral Oral Oral   Resp: 18 18 18 18   Height:      Weight:      SpO2: 95% 99% 100% 100%    General exam: Pleasant elderly female seen ambulating steadily in the room.  Respiratory system: Clear. No increased work of breathing. Cardiovascular system: S1 & S2 heard, RRR. No JVD, murmurs, gallops, clicks or pedal edema. Gastrointestinal system: Abdomen is nondistended, soft and nontender. Normal bowel sounds heard. Central nervous system: Alert and oriented. No focal neurological deficits. Extremities: Symmetric 5 x 5 power.  Discharge Instructions  Discharge Orders   Future Appointments Provider Department Dept Phone   02/11/2013 11:30 AM Lennette Bihari, MD [facility] [phone]   Future Orders Complete By Expires   Call MD for:  As directed    Comments:     Stroke like symptoms.   Diet - low sodium heart healthy  As directed    Increase activity slowly  As directed        Medication List    STOP taking these medications       aspirin 325 MG tablet      TAKE these medications       atorvastatin 10 MG tablet  Commonly known as:  LIPITOR  Take 10 mg by mouth daily.     Biotin 5000 MCG Caps  Take 1 capsule by mouth daily.     CALCIUM 600 + D 600-200 MG-UNIT Tabs  Generic drug:  Calcium Carb-Cholecalciferol  Take 1 tablet by mouth 2 (two) times daily.     clopidogrel 75 MG tablet  Commonly known as:  PLAVIX  Take 75 mg by mouth daily.     guaiFENesin 600 MG 12 hr tablet  Commonly known as:  MUCINEX  Take 1,200 mg by mouth daily as needed for congestion.     multivitamin capsule  Take 1 capsule by mouth daily.     valsartan-hydrochlorothiazide 320-12.5 MG per tablet   Commonly known as:  DIOVAN-HCT  Take 1 tablet by mouth daily.     Vitamin D 2000 UNITS Caps  Take 1 capsule by mouth daily.     zolpidem 10 MG tablet  Commonly known as:  AMBIEN  Take 10 mg by mouth at bedtime as needed for sleep.           Follow-up Information   Follow up with Londell Moh, MD. Schedule an appointment as soon as possible for a visit in 1 week.   Specialty:  Internal Medicine   Contact information:   [address] [place] [address] [phone]       Follow up with Curt Bears, MD On 02/06/2013. (Keep up prior appointment.)    Specialty:  Neurology   Contact information:   [address] [place] [place] [address] [phone]        The results of significant diagnostics from this hospitalization (including imaging, microbiology, ancillary and laboratory) are listed below for reference.    Significant Diagnostic Studies: Ct Head (brain) Wo Contrast  01/28/2013   CLINICAL DATA:  Right upper extremity weakness and numbness  EXAM: CT HEAD WITHOUT CONTRAST  TECHNIQUE: Contiguous axial images were obtained from the base of the skull through the vertex without intravenous contrast. Study was obtained within 24 hr of patient's arrival at the emergency department.  COMPARISON:  Brain MRI April 27, 2009 and brain CT April 27, 2009  FINDINGS: There is mild diffuse atrophy. There is no mass, hemorrhage, extra-axial fluid collection, or midline shift. There is extensive small vessel disease throughout the centra semiovale bilaterally, a stable finding. There is no new gray-white compartment lesion. There is no demonstrable acute infarct. Bony calvarium appears intact. The mastoid air cells are clear.  IMPRESSION: Mild atrophy with extensive supratentorial small vessel disease. No intracranial mass, hemorrhage, or acute appearing infarct.   Electronically Signed   By: Bretta Bang M.D.   On: 01/28/2013 14:42   Mr Brain Wo  Contrast  01/28/2013   CLINICAL DATA:  Numbness in the arms. Dry hacking cough. History of hypertension and dyslipidemia.  EXAM: MRI HEAD WITHOUT CONTRAST  MRA HEAD WITHOUT CONTRAST  TECHNIQUE: Multiplanar, multiecho pulse sequences of the brain and surrounding structures were obtained without intravenous contrast. Angiographic images of the head were obtained using MRA technique without contrast.  COMPARISON:  CT 01/28/2013.  FINDINGS: MRI HEAD FINDINGS  The patient was unable to remain motionless for the exam. Small or subtle lesions could be overlooked.  No evidence for acute infarction, hemorrhage, mass lesion, hydrocephalus, or extra-axial fluid. Moderate age-related atrophy. Extensive chronic microvascular ischemic change. Basal ganglia mineralization but no foci of chronic hemorrhage. Flow voids are maintained. No osseous findings. No remote large vessel infarct. Bilateral cataract extraction. No acute sinus  or mastoid fluid. Good general agreement with prior CT.  MRA HEAD FINDINGS  Grossly patent internal carotid arteries, and basilar artery. Vertebrals are codominant. No flow-limiting intracranial stenosis, branch occlusion, or aneurysm is evident on this motion degraded exam.  IMPRESSION: MRI HEAD IMPRESSION  No acute intracranial findings are evident. There is advanced atrophy with chronic microvascular ischemic change.  MRA HEAD IMPRESSION  No intracranial flow reducing lesion is evident.   Electronically Signed   By: Davonna Belling M.D.   On: 01/28/2013 18:47    Microbiology: No results found for this or any previous visit (from the past 240 hour(s)).   Labs: Basic Metabolic Panel:  Recent Labs Lab 01/28/13 1319 01/28/13 2000  NA 139  --   K 4.1  --   CL 102  --   CO2 26  --   GLUCOSE 118*  --   BUN 17  --   CREATININE 0.95 0.87  CALCIUM 10.0  --    Liver Function Tests:  Recent Labs Lab 01/28/13 1319  AST 22  ALT 20  ALKPHOS 73  BILITOT 0.4  PROT 7.4  ALBUMIN 4.3   No  results found for this basename: LIPASE, AMYLASE,  in the last 168 hours No results found for this basename: AMMONIA,  in the last 168 hours CBC:  Recent Labs Lab 01/28/13 1319 01/28/13 2000  WBC 6.5 5.8  NEUTROABS 3.9  --   HGB 14.0 13.4  HCT 41.9 39.1  MCV 90.1 89.7  PLT 202 170   Cardiac Enzymes:  Recent Labs Lab 01/28/13 1319  TROPONINI <0.30   BNP: BNP (last 3 results) No results found for this basename: PROBNP,  in the last 8760 hours CBG:  Recent Labs Lab 01/28/13 1405 01/28/13 2150 01/29/13 0631  GLUCAP 102* 143* 90    Additional labs: 1. Fasting lipids: Cholesterol 118, triglycerides 66, HDL 66, LDL 39 and VLDL 13 2. Hemoglobin A1c: 5.9 3. UDS: Negative 4. 2-D echo: Study Conclusions  - Left ventricle: The cavity size was normal. Wall thickness was increased in a pattern of mild LVH. The estimated ejection fraction was 65%. Wall motion was normal; there were no regional wall motion abnormalities. - Aortic valve: Sclerosis without stenosis. No significant regurgitation. - Right ventricle: Not able to assess RV function due to poor visualization. Not able to assess RV size due to poor visualization. Impressions:  - No cardiac source of embolism was identified, but cannot be ruled out on the basis of this examination. 5. Carotid Dopplers : Bilateral: 1-39% ICA stenosis. Vertebral artery flow is antegrade. Right: ICA/CCA ratio is 1.44. Left: ICA/CCA ratio is 0.84.      Signed:  Marcellus Scott, MD, FACP, FHM. [facility] Pager [phone]  If 7PM-7AM, please contact night-coverage [email or web address] Password [email or web address] 02/02/2013, 3:25 PM

## 2013-02-11 ENCOUNTER — Encounter: Payer: Self-pay | Admitting: Cardiovascular Disease

## 2013-02-11 ENCOUNTER — Ambulatory Visit (INDEPENDENT_AMBULATORY_CARE_PROVIDER_SITE_OTHER): Payer: Medicare Other | Admitting: Cardiovascular Disease

## 2013-02-11 VITALS — BP 110/70 | HR 63 | Ht 59.0 in | Wt 134.4 lb

## 2013-02-11 DIAGNOSIS — I10 Essential (primary) hypertension: Secondary | ICD-10-CM

## 2013-02-11 DIAGNOSIS — G459 Transient cerebral ischemic attack, unspecified: Secondary | ICD-10-CM

## 2013-02-11 DIAGNOSIS — E785 Hyperlipidemia, unspecified: Secondary | ICD-10-CM

## 2013-02-11 NOTE — Patient Instructions (Signed)
Your physician recommends that you schedule a follow-up appointment in: 1 YEAR. No changes were made today in your therapy. 

## 2013-02-11 NOTE — Progress Notes (Signed)
Patient ID: Valerie Barker, female   DOB: 10-27-1922, 79 y.o.   MRN: [id number]     HPI: Valerie Barker is a 79 y.o. female who presents to the office for cardiology followup evaluation.  Ms. Mcglothen is a very pleasant 79 year old female who I last saw in July 2013. She is the wife of my patient [name]. Remotely, she did suffer a TIA with old asymptomatic lacunar infarcts. Prior to undergoing urologic surgery at [facility] in 2012 a nuclear perfusion study showed normal perfusion. An echo Doppler study showed mild to moderate TR, trace MR, and mild aortic sclerosis without stenosis. She had normal systolic function. Carotid studies done in 2011 were normal.  Apparently, she recently was hospitalized for 2 nights after experiencing an apparent TIA leading to her current hospitalization on 01/08/2013. Her symptoms essentially resolved spontaneously. A CT of her head showed mild atrophy with extensive supratentorial small vessel disease without intracranial mass, hemorrhage or acute appearing infarction. An MRI of her brain did not show any acute intracranial findings but did show advanced atrophy with chronic microvascular ischemic changes. MRA did not reveal any intracranial flow reducing lesions are apparently during that hospitalization she underwent an echo Doppler study which was done on 01/29/2013. This showed an ejection fraction at 65%. There was mild aortic sclerosis without stenosis. Carotid Doppler study showed minimal plaque with less than 1 at 39% reduction. She ultimately was sent home on atorvastatin 10 mg Plavix 75 mg in addition to her valsartan HCT 320/12.5 and zolpidem. She states she has done fairly well. She denies recurrent symptomatology. He denies any recent exertionally precipitated chest pain. She has noted some rare fleeting episodes at night. She denies any exertionally precipitated chest tightness. She is unaware of any presyncope or syncope. She does note a rare  palpitation. At time she has a sensation that she is a "bobble head."  Past Medical History  Diagnosis Date  . Hypertension   . TIA (transient ischemic attack)     History reviewed. No pertinent past surgical history.  No Known Allergies  Current Outpatient Prescriptions  Medication Sig Dispense Refill  . atorvastatin (LIPITOR) 10 MG tablet Take 10 mg by mouth daily. Patient takes 2 tablets daily      . Biotin 5000 MCG CAPS Take 1 capsule by mouth daily.      . Calcium Carb-Cholecalciferol (CALCIUM 600 + D) 600-200 MG-UNIT TABS Take 1 tablet by mouth 2 (two) times daily.      . Cholecalciferol (VITAMIN D) 2000 UNITS CAPS Take 1 capsule by mouth daily.      . clopidogrel (PLAVIX) 75 MG tablet Take 75 mg by mouth daily.      Marland Kitchen guaiFENesin (MUCINEX) 600 MG 12 hr tablet Take 1,200 mg by mouth daily as needed for congestion.      . Multiple Vitamin (MULTIVITAMIN) capsule Take 1 capsule by mouth daily.      . valsartan-hydrochlorothiazide (DIOVAN-HCT) 320-12.5 MG per tablet Take by mouth daily. Patient takes 1/2 tablet daily.       No current facility-administered medications for this visit.    History   Social History  . Marital Status: Married    Spouse Name: N/A    Number of Children: N/A  . Years of Education: N/A   Occupational History  . Not on file.   Social History Main Topics  . Smoking status: Never Smoker   . Smokeless tobacco: Never Used  . Alcohol Use: No  .  Drug Use: No  . Sexual Activity: Not on file   Other Topics Concern  . Not on file   Social History Narrative  . No narrative on file   Socially she is married. Husband is a former Public house manager of education that [employer]. He is dealing with progressive dementia. There is some increased stress on the home front as a result of this. There is no tobacco or alcohol use.  History reviewed. No pertinent family history.  ROS is negative for fevers, chills or night sweats.  She denies visual symptoms. She  denies any residual paresthesias or weakness. She denies cough or sputum production. She denies chest tightness episodes. At times is a rare fleeting palpitation. She denies abdominal pain nausea vomiting or diarrhea. She is status post urologic repair surgery. She denies blood in her stool or urine. She denies myalgias. She denies tremors her she denies skin rash. Other comprehensive 12 point system review is negative.  PE BP 110/70  Pulse 63  Ht 4\' 11"  (1.499 m)  Wt 134 lb 6.4 oz (60.963 kg)  BMI 27.13 kg/m2  General: Alert, oriented, no distress.  Skin: normal turgor, no rashes HEENT: Normocephalic, atraumatic. Pupils round and reactive; sclera anicteric;no lid lag.  Nose without nasal septal hypertrophy Mouth/Parynx benign; Mallinpatti scale 2 Neck: No JVD, no carotid briuts Lungs: clear to ausculatation and percussion; no wheezing or rales Heart: RRR, s1 s2 normal 1/6 systolic murmur; rare ectopic complex Abdomen: soft, nontender; no hepatosplenomehaly, BS+; abdominal aorta nontender and not dilated by palpation. Pulses 2+ Extremities: no clubbing cyanosis or edema, Homan's sign negative  Neurologic: grossly nonfocal Psychologic: normal affect and mood.  ECG: Sinus rhythm with occasional PACs. Intervals are normal. Nonspecific T changes.  LABS:  BMET    Component Value Date/Time   NA 139 01/28/2013 1319   K 4.1 01/28/2013 1319   CL 102 01/28/2013 1319   CO2 26 01/28/2013 1319   GLUCOSE 118* 01/28/2013 1319   BUN 17 01/28/2013 1319   CREATININE 0.87 01/28/2013 2000   CALCIUM 10.0 01/28/2013 1319   GFRNONAA 58* 01/28/2013 2000   GFRAA 67* 01/28/2013 2000     Hepatic Function Panel     Component Value Date/Time   PROT 7.4 01/28/2013 1319   ALBUMIN 4.3 01/28/2013 1319   AST 22 01/28/2013 1319   ALT 20 01/28/2013 1319   ALKPHOS 73 01/28/2013 1319   BILITOT 0.4 01/28/2013 1319   BILIDIR 0.2 04/28/2009 0631   IBILI 0.2* 04/28/2009 0631     CBC    Component Value  Date/Time   WBC 5.8 01/28/2013 2000   RBC 4.36 01/28/2013 2000   HGB 13.4 01/28/2013 2000   HCT 39.1 01/28/2013 2000   PLT 170 01/28/2013 2000   MCV 89.7 01/28/2013 2000   MCH 30.7 01/28/2013 2000   MCHC 34.3 01/28/2013 2000   RDW 13.5 01/28/2013 2000   LYMPHSABS 1.8 01/28/2013 1319   MONOABS 0.7 01/28/2013 1319   EOSABS 0.1 01/28/2013 1319   BASOSABS 0.0 01/28/2013 1319     BNP No results found for this basename: probnp    Lipid Panel     Component Value Date/Time   CHOL 118 01/29/2013 0520   TRIG 66 01/29/2013 0520   HDL 66 01/29/2013 0520   CHOLHDL 1.8 01/29/2013 0520   VLDL 13 01/29/2013 0520   LDLCALC 39 01/29/2013 0520     RADIOLOGY: Ct Head (brain) Wo Contrast  01/28/2013   CLINICAL DATA:  Right upper extremity  weakness and numbness  EXAM: CT HEAD WITHOUT CONTRAST  TECHNIQUE: Contiguous axial images were obtained from the base of the skull through the vertex without intravenous contrast. Study was obtained within 24 hr of patient's arrival at the emergency department.  COMPARISON:  Brain MRI April 27, 2009 and brain CT April 27, 2009  FINDINGS: There is mild diffuse atrophy. There is no mass, hemorrhage, extra-axial fluid collection, or midline shift. There is extensive small vessel disease throughout the centra semiovale bilaterally, a stable finding. There is no new gray-white compartment lesion. There is no demonstrable acute infarct. Bony calvarium appears intact. The mastoid air cells are clear.  IMPRESSION: Mild atrophy with extensive supratentorial small vessel disease. No intracranial mass, hemorrhage, or acute appearing infarct.   Electronically Signed   By: Bretta Bang M.D.   On: 01/28/2013 14:42   Mr Brain Wo Contrast  01/28/2013   CLINICAL DATA:  Numbness in the arms. Dry hacking cough. History of hypertension and dyslipidemia.  EXAM: MRI HEAD WITHOUT CONTRAST  MRA HEAD WITHOUT CONTRAST  TECHNIQUE: Multiplanar, multiecho pulse sequences of the  brain and surrounding structures were obtained without intravenous contrast. Angiographic images of the head were obtained using MRA technique without contrast.  COMPARISON:  CT 01/28/2013.  FINDINGS: MRI HEAD FINDINGS  The patient was unable to remain motionless for the exam. Small or subtle lesions could be overlooked.  No evidence for acute infarction, hemorrhage, mass lesion, hydrocephalus, or extra-axial fluid. Moderate age-related atrophy. Extensive chronic microvascular ischemic change. Basal ganglia mineralization but no foci of chronic hemorrhage. Flow voids are maintained. No osseous findings. No remote large vessel infarct. Bilateral cataract extraction. No acute sinus or mastoid fluid. Good general agreement with prior CT.  MRA HEAD FINDINGS  Grossly patent internal carotid arteries, and basilar artery. Vertebrals are codominant. No flow-limiting intracranial stenosis, branch occlusion, or aneurysm is evident on this motion degraded exam.  IMPRESSION: MRI HEAD IMPRESSION  No acute intracranial findings are evident. There is advanced atrophy with chronic microvascular ischemic change.  MRA HEAD IMPRESSION  No intracranial flow reducing lesion is evident.   Electronically Signed   By: Davonna Belling M.D.   On: 01/28/2013 18:47   Mr Maxine Glenn Head/brain Wo Cm  01/28/2013   CLINICAL DATA:  Numbness in the arms. Dry hacking cough. History of hypertension and dyslipidemia.  EXAM: MRI HEAD WITHOUT CONTRAST  MRA HEAD WITHOUT CONTRAST  TECHNIQUE: Multiplanar, multiecho pulse sequences of the brain and surrounding structures were obtained without intravenous contrast. Angiographic images of the head were obtained using MRA technique without contrast.  COMPARISON:  CT 01/28/2013.  FINDINGS: MRI HEAD FINDINGS  The patient was unable to remain motionless for the exam. Small or subtle lesions could be overlooked.  No evidence for acute infarction, hemorrhage, mass lesion, hydrocephalus, or extra-axial fluid. Moderate  age-related atrophy. Extensive chronic microvascular ischemic change. Basal ganglia mineralization but no foci of chronic hemorrhage. Flow voids are maintained. No osseous findings. No remote large vessel infarct. Bilateral cataract extraction. No acute sinus or mastoid fluid. Good general agreement with prior CT.  MRA HEAD FINDINGS  Grossly patent internal carotid arteries, and basilar artery. Vertebrals are codominant. No flow-limiting intracranial stenosis, branch occlusion, or aneurysm is evident on this motion degraded exam.  IMPRESSION: MRI HEAD IMPRESSION  No acute intracranial findings are evident. There is advanced atrophy with chronic microvascular ischemic change.  MRA HEAD IMPRESSION  No intracranial flow reducing lesion is evident.   Electronically Signed   By: Jonny Ruiz  Curnes M.D.   On: 01/28/2013 18:47      ASSESSMENT AND PLAN:  Ms. Schrodt is now 79 years old. She is status post a recent TIA and remotely had been previously diagnosed as having a TIA with old asymptomatic lacunar infarct. Her most recent CT and MRI and MRA studies were reviewed. She does have significant small vessel disease of her brain. We previously had undergone head done an echo Doppler study in 2012 and her most recent study does not suggest significant abnormalities with the exception of aortic sclerosis. She continues abnormal LV function. In particular perfusion study was normal in 2012. I do not believe she is having anginal symptoms. I agree with continued Plavix therapy. AI agree with aggressive lipid intervention with target LDL less than 70, if possible. She recently had carotid studies. I will see her in in one year or sooner problems arise.      Lennette Bihari, MD, [facility]  02/11/2013 1:16 PM

## 2013-02-12 ENCOUNTER — Encounter: Payer: Self-pay | Admitting: Cardiovascular Disease

## 2013-05-31 ENCOUNTER — Telehealth: Payer: Self-pay | Admitting: Cardiovascular Disease

## 2013-05-31 NOTE — Telephone Encounter (Signed)
Returned call and pt verified x 2.  Pt stated she spoke w/ someone this morning and has an appt on Monday w/ the PA.  No further action taken.

## 2013-05-31 NOTE — Telephone Encounter (Signed)
Per answering service-Pt had an Episode-she was told to see her cardiologist asap.

## 2013-06-03 ENCOUNTER — Encounter: Payer: Self-pay | Admitting: Physician Assistant

## 2013-06-03 ENCOUNTER — Ambulatory Visit (INDEPENDENT_AMBULATORY_CARE_PROVIDER_SITE_OTHER): Payer: Medicare Other | Admitting: Physician Assistant

## 2013-06-03 VITALS — BP 132/70 | HR 64 | Ht 59.0 in | Wt 130.0 lb

## 2013-06-03 DIAGNOSIS — I10 Essential (primary) hypertension: Secondary | ICD-10-CM

## 2013-06-03 DIAGNOSIS — R002 Palpitations: Secondary | ICD-10-CM | POA: Insufficient documentation

## 2013-06-03 NOTE — Patient Instructions (Signed)
1. If you have any of these episodes during the day, please call our office, and I will schedule you to wear a heart monitor for a couple weeks. 2.  Follow up with Dr. Claiborne Billings in 6 months . 3.  Try taking half a melatonin at bedtime.

## 2013-06-03 NOTE — Assessment & Plan Note (Signed)
Blood pressure well controlled

## 2013-06-03 NOTE — Progress Notes (Signed)
Date:  06/03/2013   ID:  ANAKA BEAZER, DOB 12/07/1922, MRN [id number]  PCP:  Horatio Pel, MD  Primary Cardiologist:  Claiborne Billings    History of Present Illness: Valerie Barker is a 80 y.o. female Remotely, she did suffer a TIA with old asymptomatic lacunar infarcts. Prior to undergoing urologic surgery at [facility] in 07/28/10 a nuclear perfusion study showed normal perfusion. An echo Doppler study showed mild to moderate TR, trace MR, and mild aortic sclerosis without stenosis. She had normal systolic function. Carotid studies done in 07/27/09 were normal.   Apparently, she recently was hospitalized for 2 nights after experiencing an apparent TIA leading to her current hospitalization on 01/08/2013. Her symptoms essentially resolved spontaneously. A CT of her head showed mild atrophy with extensive supratentorial small vessel disease without intracranial mass, hemorrhage or acute appearing infarction. An MRI of her brain did not show any acute intracranial findings but did show advanced atrophy with chronic microvascular ischemic changes. MRA did not reveal any intracranial flow reducing lesions are apparently during that hospitalization she underwent an echo Doppler study which was done on 01/29/2013. This showed an ejection fraction at 65%. There was mild aortic sclerosis without stenosis. Carotid Doppler study showed minimal plaque with less than 1 at 39% reduction. She ultimately was sent home on atorvastatin 10 mg Plavix 75 mg in addition to her valsartan HCT 320/12.5 and zolpidem.  Patient presents today for evaluation. 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 This again happened on July 28, 2022 night. She reports feeling weak and tired. She's been under a lot of stress lately as well her husband has dementia and they now have a 24/7 caregiver staying at their home.  Also, her brother-in-law just died this past 07/28/22 and she had to tell her sister who was in the  hospital that he had died.  She is also executor of their estate is added to her stress.  She's had none of these symptoms during the day although during the day she feels tired. She did call emergency medicine on February 26.  They came to her home. The EKG showed sinus rhythm rate of 64 beats per minute.  EKG in the office shows normal sinus rhythm with a rate of 65 beats per minute  The patient currently denies nausea, vomiting, fever, chest pain, orthopnea, PND, cough, congestion, abdominal pain, hematochezia, melena, lower extremity edema, claudication.  Wt Readings from Last 3 Encounters:  06/03/13 130 lb (58.968 kg)  02/11/13 134 lb 6.4 oz (60.963 kg)  01/28/13 133 lb (60.328 kg)     Past Medical History  Diagnosis Date  . Hypertension   . TIA (transient ischemic attack)     Current Outpatient Prescriptions  Medication Sig Dispense Refill  . atorvastatin (LIPITOR) 10 MG tablet Take 10 mg by mouth daily. Patient takes 2 tablets daily      . Biotin 5000 MCG CAPS Take 1 capsule by mouth daily.      . Calcium Carb-Cholecalciferol (CALCIUM 600 + D) 600-200 MG-UNIT TABS Take 1 tablet by mouth 2 (two) times daily.      . Cholecalciferol (VITAMIN D) 2000 UNITS CAPS Take 1 capsule by mouth daily.      . clopidogrel (PLAVIX) 75 MG tablet Take 75 mg by mouth daily.      Marland Kitchen guaiFENesin (MUCINEX) 600 MG 12 hr tablet Take 1,200 mg by mouth daily as needed for congestion.      . Multiple  Vitamin (MULTIVITAMIN) capsule Take 1 capsule by mouth daily.      . pantoprazole (PROTONIX) 40 MG tablet       . valsartan-hydrochlorothiazide (DIOVAN-HCT) 320-12.5 MG per tablet Take by mouth daily. Patient takes 1/2 tablet daily.       No current facility-administered medications for this visit.    Allergies:   No Known Allergies  Social History:  The patient  reports that she has never smoked. She has never used smokeless tobacco. She reports that she does not drink alcohol or use illicit drugs.    Family history:  History reviewed. No pertinent family history.  ROS:  Please see the history of present illness.  All other systems reviewed and negative.   PHYSICAL EXAM: VS:  BP 132/70  Pulse 64  Ht 4\' 11"  (1.499 m)  Wt 130 lb (58.968 kg)  BMI 26.24 kg/m2 Well nourished, well developed, in no acute distress HEENT: Pupils are equal round react to light accommodation extraocular movements are intact.  Neck: no JVDNo cervical lymphadenopathy. Cardiac: Regular rate and rhythm without murmurs rubs or gallops. Lungs:  clear to auscultation bilaterally, no wheezing, rhonchi or rales Abd: soft, nontender, positive bowel sounds all quadrants, no hepatosplenomegaly Ext: no lower extremity edema.  2+ radial and dorsalis pedis pulses. Skin: warm and dry Neuro:  Grossly normal  EKG: Normal sinus rhythm rate 65 beats per minute     ASSESSMENT AND PLAN:  Problem List Items Addressed This Visit   HTN (hypertension)     Blood pressure well controlled.    Palpitation     It sounds as though the patient is under great stress and is having some panic attacks in the evening after she goes to bed. EKG shows normal sinus rhythm. She's not having any symptoms other than some weakness and tiredness during the day, which may be attributed to  poor sleep.  Should she have continued episodes or have one during the day we should consider adding a heart rate monitor.   It could be arrhythmic however, for it to only occur in the evening, is unlikely.  She was prescribed some medicine by Dr. Shelia Media for anxiety, however, after taking one time she found herself walking in the hall and not knowing where she was at.  Recommended she not take taken.      Other Visit Diagnoses   Palpitations    -  Primary    Relevant Orders       EKG 12-Lead

## 2013-06-03 NOTE — Assessment & Plan Note (Signed)
It sounds as though the patient is under great stress and is having some panic attacks in the evening after she goes to bed. EKG shows normal sinus rhythm. She's not having any symptoms other than some weakness and tiredness during the day, which may be attributed to  poor sleep.  Should she have continued episodes or have one during the day we should consider adding a heart rate monitor.   It could be arrhythmic however, for it to only occur in the evening, is unlikely.  She was prescribed some medicine by Dr. Shelia Media for anxiety, however, after taking one time she found herself walking in the hall and not knowing where she was at.  Recommended she not take taken.

## 2013-09-06 ENCOUNTER — Ambulatory Visit: Payer: Medicare Other | Admitting: Cardiovascular Disease

## 2013-11-11 ENCOUNTER — Other Ambulatory Visit: Payer: Self-pay

## 2013-11-11 DIAGNOSIS — Z1231 Encounter for screening mammogram for malignant neoplasm of breast: Secondary | ICD-10-CM

## 2013-11-22 ENCOUNTER — Ambulatory Visit
Admission: RE | Admit: 2013-11-22 | Discharge: 2013-11-22 | Disposition: A | Discharge status: Home / Self Care | Payer: Medicare Other | Source: Ambulatory Visit

## 2013-11-22 ENCOUNTER — Encounter (INDEPENDENT_AMBULATORY_CARE_PROVIDER_SITE_OTHER): Payer: Self-pay

## 2013-11-22 DIAGNOSIS — Z1231 Encounter for screening mammogram for malignant neoplasm of breast: Secondary | ICD-10-CM

## 2013-11-29 ENCOUNTER — Telehealth: Payer: Self-pay | Admitting: Cardiovascular Disease

## 2013-11-29 ENCOUNTER — Encounter: Payer: Self-pay | Admitting: Cardiovascular Disease

## 2013-12-02 NOTE — Telephone Encounter (Signed)
closed encounter

## 2013-12-12 ENCOUNTER — Ambulatory Visit: Payer: Medicare Other | Admitting: Cardiovascular Disease

## 2014-01-29 ENCOUNTER — Ambulatory Visit (INDEPENDENT_AMBULATORY_CARE_PROVIDER_SITE_OTHER): Payer: Medicare Other | Admitting: Cardiovascular Disease

## 2014-01-29 ENCOUNTER — Encounter: Payer: Self-pay | Admitting: Cardiovascular Disease

## 2014-01-29 VITALS — BP 130/80 | HR 68 | Ht 59.0 in | Wt 132.5 lb

## 2014-01-29 DIAGNOSIS — R413 Other amnesia: Secondary | ICD-10-CM

## 2014-01-29 DIAGNOSIS — R002 Palpitations: Secondary | ICD-10-CM

## 2014-01-29 DIAGNOSIS — E785 Hyperlipidemia, unspecified: Secondary | ICD-10-CM

## 2014-01-29 DIAGNOSIS — I10 Essential (primary) hypertension: Secondary | ICD-10-CM

## 2014-01-29 DIAGNOSIS — G459 Transient cerebral ischemic attack, unspecified: Secondary | ICD-10-CM

## 2014-01-29 MED ORDER — METOPROLOL SUCCINATE ER 25 MG PO TB24: ORAL_TABLET | ORAL | Status: DC

## 2014-01-29 NOTE — Progress Notes (Signed)
Patient ID: Valerie Barker, female   DOB: 1922/08/07, 80 y.o.   MRN: [id number]     HPI: Valerie Barker is a 80 y.o. female who presents to the office for a one year cardiology followup evaluation.  Valerie Barker  is the wife of my patient Dr. Buckner [place]. Remotely, she suffered a TIA and has documented old asymptomatic lacunar infarcts. Prior to undergoing urologic surgery at [facility] in 2012 a nuclear perfusion study showed normal perfusion. An echo Doppler study demonstrated mild to moderate TR, trace MR, and mild aortic sclerosis without stenosis. She had normal systolic function. Carotid studies done in 2011 were normal.  She was  hospitalized for 2 nights after experiencing an TIA on 01/08/2013. Her symptoms essentially resolved spontaneously. A CT of her head showed mild atrophy with extensive supratentorial small vessel disease without intracranial mass, hemorrhage or acute appearing infarction. An MRI of her brain did not show any acute intracranial findings but did show advanced atrophy with chronic microvascular ischemic changes. MRA did not reveal any intracranial flow reducing lesions. During that hospitalization a f/u echo Doppler study  on 01/29/2013 showed an ejection fraction at 65%. There was mild aortic sclerosis without stenosis. Carotid Doppler study showed minimal plaque with less than 39% reduction. She  was sent home on atorvastatin 10 mg Plavix 75 mg in addition to her valsartan HCT 320/12.5 and zolpidem.  She was seen in the office by Tenny Craw in March 2015 with complaints of palpitations.  There was concern that some of this may have been stress mediated.  Recently, she continues to experience increased stress at home caring for husband who has become progressive more demented.  She has noticed some decreased short-term memory herself.  Has been somewhat labile recently and on one account.  She states her blood pressure had risen to as high as 190.  She denies chest  pain.  She denies PND, orthopnea.  She denies dizziness.  Past Medical History  Diagnosis Date  . Hypertension   . TIA (transient ischemic attack)   . IBS (irritable bowel syndrome)     Past Surgical History  Procedure Laterality Date  . Bunionectomy  2006, 1996  . Laparoscopic salpingo oopherectomy  1988  . Abdominal hysterectomy  1978    Allergies  Allergen Reactions  . Erythromycin     Current Outpatient Prescriptions  Medication Sig Dispense Refill  . albuterol (PROVENTIL HFA;VENTOLIN HFA) 108 (90 BASE) MCG/ACT inhaler Inhale into the lungs every 6 (six) hours as needed for wheezing or shortness of breath.      Marland Kitchen atorvastatin (LIPITOR) 10 MG tablet Take 10 mg by mouth daily. Patient takes 2 tablets daily      . Biotin 5000 MCG CAPS Take 1 capsule by mouth daily.      . Calcium Carb-Cholecalciferol (CALCIUM 600 + D) 600-200 MG-UNIT TABS Take 1 tablet by mouth 2 (two) times daily.      . Cholecalciferol (VITAMIN D) 2000 UNITS CAPS Take 1 capsule by mouth daily.      . clopidogrel (PLAVIX) 75 MG tablet Take 75 mg by mouth daily.      Marland Kitchen estradiol (CLIMARA - DOSED IN MG/24 HR) 0.05 mg/24hr patch Place 0.05 mg onto the skin once a week.      . gabapentin (NEURONTIN) 100 MG capsule Take 1 capsule by mouth daily.      Marland Kitchen guaiFENesin (MUCINEX) 600 MG 12 hr tablet Take 1,200 mg by mouth daily as needed for  congestion.      Marland Kitchen HYDROcodone-acetaminophen (NORCO/VICODIN) 5-325 MG per tablet Take 1 tablet by mouth as needed.      . Multiple Vitamin (MULTIVITAMIN) capsule Take 1 capsule by mouth daily.      . valsartan-hydrochlorothiazide (DIOVAN-HCT) 320-12.5 MG per tablet Take by mouth daily. Patient takes 1/2 tablet daily.      . pantoprazole (PROTONIX) 40 MG tablet        No current facility-administered medications for this visit.    History   Social History  . Marital Status: Married    Spouse Name: N/A    Number of Children: N/A  . Years of Education: N/A   Occupational  History  . Not on file.   Social History Main Topics  . Smoking status: Never Smoker   . Smokeless tobacco: Never Used  . Alcohol Use: No  . Drug Use: No  . Sexual Activity: Not on file   Other Topics Concern  . Not on file   Social History Narrative  . No narrative on file   Socially she is married. Husband is a former Scientist, physiological of education that [employer]. He is dealing with progressive dementia. There is some increased stress on the home front as a result of this. There is no tobacco or alcohol use.  Family History  Problem Relation Age of Onset  . COPD Mother   . Schizophrenia Brother    ROS General: Negative; No fevers, chills, or night sweats;  HEENT: Negative; No changes in vision or hearing, sinus congestion, difficulty swallowing Pulmonary: Negative; No cough, wheezing, shortness of breath, hemoptysis Cardiovascular: Negative; No chest pain, presyncope, syncope, palpitations GI: Negative; No nausea, vomiting, diarrhea, or abdominal pain GU: Positive for recurrent urinary incontinence despite her prior urologic surgery; No dysuria, hematuria, or difficulty voiding Musculoskeletal: Negative; no myalgias, joint pain, or weakness Hematologic/Oncology: Negative; no easy bruising, bleeding Endocrine: Negative; no heat/cold intolerance; no diabetes Neuro: Positive for decreased short-term memory and history of TIA; no changes in balance, headaches Skin: Negative; No rashes or skin lesions Psychiatric: Negative; No behavioral problems, depression Sleep: Negative; No snoring, daytime sleepiness, hypersomnolence, bruxism, restless legs, hypnogognic hallucinations, no cataplexy Other comprehensive 14 point system review is negative.   PE BP 130/80  Pulse 68  Ht 4\' 11"  (1.499 m)  Wt 132 lb 8 oz (60.102 kg)  BMI 26.75 kg/m2  Repeat blood pressure by me 148/80 General: Alert, oriented, no distress.  Skin: normal turgor, no rashes HEENT: Normocephalic, atraumatic. Pupils  round and reactive; sclera anicteric;no lid lag.  Nose without nasal septal hypertrophy Mouth/Parynx benign; Mallinpatti scale 2 Neck: No JVD, no carotid bruits Lungs: clear to ausculatation and percussion; no wheezing or rales Chest wall: Nontender to palpation Heart: RRR, s1 s2 normal 1/6 systolic murmur; rare ectopic complex Abdomen: soft, nontender; no hepatosplenomehaly, BS+; abdominal aorta nontender and not dilated by palpation. Back: No CVA tenderness Pulses 2+ Extremities: no clubbing cyanosis or edema, Homan's sign negative  Neurologic: grossly nonfocal Psychologic: normal affect and mood.  ECG (and apparently read by me) : Sinus rhythm with frequent PACs and transient atrial bigeminal pattern  Prior November 2014 ECG: Sinus rhythm with occasional PACs. Intervals are normal. Nonspecific T changes.  LABS:  BMET    Component Value Date/Time   NA 139 01/28/2013 1319   K 4.1 01/28/2013 1319   CL 102 01/28/2013 1319   CO2 26 01/28/2013 1319   GLUCOSE 118* 01/28/2013 1319   BUN 17 01/28/2013 1319  CREATININE 0.87 01/28/2013 2000   CALCIUM 10.0 01/28/2013 1319   GFRNONAA 58* 01/28/2013 2000   GFRAA 67* 01/28/2013 2000     Hepatic Function Panel     Component Value Date/Time   PROT 7.4 01/28/2013 1319   ALBUMIN 4.3 01/28/2013 1319   AST 22 01/28/2013 1319   ALT 20 01/28/2013 1319   ALKPHOS 73 01/28/2013 1319   BILITOT 0.4 01/28/2013 1319   BILIDIR 0.2 04/28/2009 0631   IBILI 0.2* 04/28/2009 0631     CBC    Component Value Date/Time   WBC 5.8 01/28/2013 2000   RBC 4.36 01/28/2013 2000   HGB 13.4 01/28/2013 2000   HCT 39.1 01/28/2013 2000   PLT 170 01/28/2013 2000   MCV 89.7 01/28/2013 2000   MCH 30.7 01/28/2013 2000   MCHC 34.3 01/28/2013 2000   RDW 13.5 01/28/2013 2000   LYMPHSABS 1.8 01/28/2013 1319   MONOABS 0.7 01/28/2013 1319   EOSABS 0.1 01/28/2013 1319   BASOSABS 0.0 01/28/2013 1319     BNP No results found for this basename: probnp     Lipid Panel     Component Value Date/Time   CHOL 118 01/29/2013 0520   TRIG 66 01/29/2013 0520   HDL 66 01/29/2013 0520   CHOLHDL 1.8 01/29/2013 0520   VLDL 13 01/29/2013 0520   LDLCALC 39 01/29/2013 0520     RADIOLOGY: Ct Head (brain) Wo Contrast  01/28/2013   CLINICAL DATA:  Right upper extremity weakness and numbness  EXAM: CT HEAD WITHOUT CONTRAST  TECHNIQUE: Contiguous axial images were obtained from the base of the skull through the vertex without intravenous contrast. Study was obtained within 24 hr of patient's arrival at the emergency department.  COMPARISON:  Brain MRI April 27, 2009 and brain CT April 27, 2009  FINDINGS: There is mild diffuse atrophy. There is no mass, hemorrhage, extra-axial fluid collection, or midline shift. There is extensive small vessel disease throughout the centra semiovale bilaterally, a stable finding. There is no new gray-white compartment lesion. There is no demonstrable acute infarct. Bony calvarium appears intact. The mastoid air cells are clear.  IMPRESSION: Mild atrophy with extensive supratentorial small vessel disease. No intracranial mass, hemorrhage, or acute appearing infarct.   Electronically Signed   By: Lowella Grip M.D.   On: 01/28/2013 14:42   Mr Brain Wo Contrast  01/28/2013   CLINICAL DATA:  Numbness in the arms. Dry hacking cough. History of hypertension and dyslipidemia.  EXAM: MRI HEAD WITHOUT CONTRAST  MRA HEAD WITHOUT CONTRAST  TECHNIQUE: Multiplanar, multiecho pulse sequences of the brain and surrounding structures were obtained without intravenous contrast. Angiographic images of the head were obtained using MRA technique without contrast.  COMPARISON:  CT 01/28/2013.  FINDINGS: MRI HEAD FINDINGS  The patient was unable to remain motionless for the exam. Small or subtle lesions could be overlooked.  No evidence for acute infarction, hemorrhage, mass lesion, hydrocephalus, or extra-axial fluid. Moderate age-related  atrophy. Extensive chronic microvascular ischemic change. Basal ganglia mineralization but no foci of chronic hemorrhage. Flow voids are maintained. No osseous findings. No remote large vessel infarct. Bilateral cataract extraction. No acute sinus or mastoid fluid. Good general agreement with prior CT.  MRA HEAD FINDINGS  Grossly patent internal carotid arteries, and basilar artery. Vertebrals are codominant. No flow-limiting intracranial stenosis, branch occlusion, or aneurysm is evident on this motion degraded exam.  IMPRESSION: MRI HEAD IMPRESSION  No acute intracranial findings are evident. There is advanced atrophy with chronic microvascular ischemic  change.  MRA HEAD IMPRESSION  No intracranial flow reducing lesion is evident.   Electronically Signed   By: Rolla Flatten M.D.   On: 01/28/2013 18:47   Mr Jodene Nam Head/brain Wo Cm  01/28/2013   CLINICAL DATA:  Numbness in the arms. Dry hacking cough. History of hypertension and dyslipidemia.  EXAM: MRI HEAD WITHOUT CONTRAST  MRA HEAD WITHOUT CONTRAST  TECHNIQUE: Multiplanar, multiecho pulse sequences of the brain and surrounding structures were obtained without intravenous contrast. Angiographic images of the head were obtained using MRA technique without contrast.  COMPARISON:  CT 01/28/2013.  FINDINGS: MRI HEAD FINDINGS  The patient was unable to remain motionless for the exam. Small or subtle lesions could be overlooked.  No evidence for acute infarction, hemorrhage, mass lesion, hydrocephalus, or extra-axial fluid. Moderate age-related atrophy. Extensive chronic microvascular ischemic change. Basal ganglia mineralization but no foci of chronic hemorrhage. Flow voids are maintained. No osseous findings. No remote large vessel infarct. Bilateral cataract extraction. No acute sinus or mastoid fluid. Good general agreement with prior CT.  MRA HEAD FINDINGS  Grossly patent internal carotid arteries, and basilar artery. Vertebrals are codominant. No flow-limiting  intracranial stenosis, branch occlusion, or aneurysm is evident on this motion degraded exam.  IMPRESSION: MRI HEAD IMPRESSION  No acute intracranial findings are evident. There is advanced atrophy with chronic microvascular ischemic change.  MRA HEAD IMPRESSION  No intracranial flow reducing lesion is evident.   Electronically Signed   By: Rolla Flatten M.D.   On: 01/28/2013 18:47      ASSESSMENT AND PLAN:  Ms. Randle is an 80 years old with history of prior TIA's and remotely had been  diagnosed as having a TIA with old asymptomatic lacunar infarct. On CT, MRI and MRA studies there is  significant small vessel disease of her brain.  She does have normal systolic function and evidence for aortic sclerosis on echo assessment.. She continues abnormal LV function. In particular perfusion study was normal in 2012. I do not believe she is having anginal symptoms.  She is on Plavix with her TIA history.  Currently she is on valsartan HCT 320/12.5 mg but has been only taking this one half tablet a day.  She has had significant blood pressure lability with recent blood pressure apparently as high as 190.  I have suggested that she change this to one pill daily rather than one half a pill.  She also has noted palpitations and has ectopy.  I will commence very low-dose metoprolol succinate at 12.5 mg but she will hold this if her pulse gets below 55.  As long as she remains stable, she will follow-up with her primary M.D. concerning blood pressure with the medication changes.  I will see her in 6 months for cardiology reevaluation.    Troy Sine, MD, [facility]  01/29/2014 4:01 PM

## 2014-01-29 NOTE — Patient Instructions (Signed)
Your physician has recommended you make the following change in your medication: start new prescription for metoprolol succ 25 mg as directed on the bottle.   Your physician wants you to follow-up in: 6 months or sooner if needed with Dr. Claiborne Billings. You will receive a reminder letter in the mail two months in advance. If you don't receive a letter, please call our office to schedule the follow-up appointment.

## 2014-02-01 DIAGNOSIS — R413 Other amnesia: Secondary | ICD-10-CM | POA: Insufficient documentation

## 2014-02-10 ENCOUNTER — Other Ambulatory Visit: Payer: Self-pay | Admitting: Internal Medicine

## 2014-02-10 DIAGNOSIS — G4489 Other headache syndrome: Secondary | ICD-10-CM

## 2014-02-13 ENCOUNTER — Ambulatory Visit
Admission: RE | Admit: 2014-02-13 | Discharge: 2014-02-13 | Disposition: A | Discharge status: Home / Self Care | Payer: Medicare Other | Source: Ambulatory Visit | Attending: Internal Medicine | Admitting: Internal Medicine

## 2014-02-13 DIAGNOSIS — G4489 Other headache syndrome: Secondary | ICD-10-CM

## 2014-04-07 DIAGNOSIS — M150 Primary generalized (osteo)arthritis: Secondary | ICD-10-CM | POA: Diagnosis not present

## 2014-04-07 DIAGNOSIS — M5136 Other intervertebral disc degeneration, lumbar region: Secondary | ICD-10-CM | POA: Diagnosis not present

## 2014-04-07 DIAGNOSIS — M5416 Radiculopathy, lumbar region: Secondary | ICD-10-CM | POA: Diagnosis not present

## 2014-04-16 DIAGNOSIS — G2581 Restless legs syndrome: Secondary | ICD-10-CM | POA: Diagnosis not present

## 2014-05-07 DIAGNOSIS — H40013 Open angle with borderline findings, low risk, bilateral: Secondary | ICD-10-CM | POA: Diagnosis not present

## 2014-05-27 DIAGNOSIS — N3946 Mixed incontinence: Secondary | ICD-10-CM | POA: Diagnosis not present

## 2014-05-27 DIAGNOSIS — R600 Localized edema: Secondary | ICD-10-CM | POA: Diagnosis not present

## 2014-05-27 DIAGNOSIS — G2581 Restless legs syndrome: Secondary | ICD-10-CM | POA: Diagnosis not present

## 2014-07-21 DIAGNOSIS — N3946 Mixed incontinence: Secondary | ICD-10-CM | POA: Diagnosis not present

## 2014-07-30 DIAGNOSIS — N3281 Overactive bladder: Secondary | ICD-10-CM | POA: Diagnosis not present

## 2014-07-30 DIAGNOSIS — K5909 Other constipation: Secondary | ICD-10-CM | POA: Diagnosis not present

## 2014-07-30 DIAGNOSIS — N3946 Mixed incontinence: Secondary | ICD-10-CM | POA: Diagnosis not present

## 2014-08-04 ENCOUNTER — Ambulatory Visit (INDEPENDENT_AMBULATORY_CARE_PROVIDER_SITE_OTHER): Payer: Medicare Other | Admitting: Cardiovascular Disease

## 2014-08-04 VITALS — BP 130/70 | Ht 59.0 in | Wt 141.0 lb

## 2014-08-04 DIAGNOSIS — E785 Hyperlipidemia, unspecified: Secondary | ICD-10-CM | POA: Diagnosis not present

## 2014-08-04 DIAGNOSIS — R413 Other amnesia: Secondary | ICD-10-CM

## 2014-08-04 DIAGNOSIS — R002 Palpitations: Secondary | ICD-10-CM | POA: Diagnosis not present

## 2014-08-04 DIAGNOSIS — R609 Edema, unspecified: Secondary | ICD-10-CM | POA: Diagnosis not present

## 2014-08-04 DIAGNOSIS — G459 Transient cerebral ischemic attack, unspecified: Secondary | ICD-10-CM

## 2014-08-04 DIAGNOSIS — R600 Localized edema: Secondary | ICD-10-CM

## 2014-08-04 DIAGNOSIS — I10 Essential (primary) hypertension: Secondary | ICD-10-CM | POA: Diagnosis not present

## 2014-08-04 NOTE — Patient Instructions (Signed)
Your physician recommends that you return for lab work fasting.    Compression Stockings Compression stockings are elastic stockings that "compress" your legs. This helps to increase blood flow, decrease swelling, and reduces the chance of getting blood clots in your lower legs. Compression stockings are used:  After surgery.  If you have a history of poor circulation.  If you are prone to blood clots.  If you have varicose veins. . WEARING COMPRESSION STOCKINGS  Your compression stockings should be worn as instructed by your caregiver.  Wearing the correct stocking size is important. Your caregiver can help measure and fit you to the correct size.  When wearing your stockings, do not allow the stockings to bunch up. This is especially important around your toes or behind your knees. Keep the stockings as smooth as possible.  Do not roll the stockings downward and leave them rolled down. This can form a restrictive band around your legs and can decrease blood flow.  The stockings should be removed once a day for 1 hour or as instructed by your caregiver. When the stockings are taken off, inspect your legs and feet. Look for:  Open sores.  Red spots.  Puffy areas (swelling).  Anything that does not seem normal. IMPORTANT INFORMATION ABOUT COMPRESSION STOCKINGS  The compression stockings should be clean, dry, and in good condition before you put them on.  Do not put lotion on your legs or feet. This makes it harder to put the stockings on.  Change your stockings immediately if they become wet or soiled.  Do not wear stockings that are ripped or torn.  You may hand-wash or put your stockings in the washing machine. Use cold or warm water with mild detergent. Do not bleach your stockings. They may be air-dried or dried in the dryer on low heat.  If you have pain or have a feeling of "pins and needles" in your feet or legs, you may be wearing stockings that are too tight.  Call your caregiver right away. SEEK IMMEDIATE MEDICAL CARE IF:   You have numbness or tingling in your lower legs that does not get better quickly after the stockings are removed.  Your toes or feet become cold and blue.  You develop open sores or have red spots on your legs that do not go away. MAKE SURE YOU:   Understand these instructions.  Will watch your condition.  Will get help right away if you are not doing well or get worse. Document Released: 01/16/2009 Document Revised: 06/13/2011 Document Reviewed: 01/16/2009 [facility] Patient Information 2015 [place], [place]. This information is not intended to replace advice given to you by your health care provider. Make sure you discuss any questions you have with your health care provider.  Your physician recommends that you schedule a follow-up appointment in: 6 months with Dr. Claiborne Billings.

## 2014-08-05 ENCOUNTER — Encounter: Payer: Self-pay | Admitting: Cardiovascular Disease

## 2014-08-05 DIAGNOSIS — R600 Localized edema: Secondary | ICD-10-CM | POA: Insufficient documentation

## 2014-08-05 NOTE — Progress Notes (Signed)
Patient ID: Valerie Barker, female   DOB: Feb 15, 1923, 81 y.o.   MRN: [id number]     HPI: Valerie Barker is a 81 y.o. female who presents to the office for a month cardiology followup evaluation.  Valerie Barker is the wife of my former patient Valerie Barker.  Since I last saw her, unfortunately her husband died on 03-Apr-2015.  Remotely, Valerie Barker suffered a TIA and has documented old asymptomatic lacunar infarcts. Prior to undergoing urologic surgery at [facility] in 2012 a nuclear perfusion study showed normal perfusion. An echo Doppler study demonstrated mild to moderate TR, trace MR, and mild aortic sclerosis without stenosis. She had normal systolic function. Carotid studies done in 2011 were normal.  She was  hospitalized for 2 nights after experiencing an TIA on 01/08/2013. Her symptoms resolved spontaneously. A CT of her head showed mild atrophy with extensive supratentorial small vessel disease without intracranial mass, hemorrhage or acute appearing infarction. An MRI of her brain did not show any acute intracranial findings but did show advanced atrophy with chronic microvascular ischemic changes. MRA did not reveal any intracranial flow reducing lesions. During that hospitalization a f/u echo Doppler study  on 01/29/2013 showed an ejection fraction at 65%. There was mild aortic sclerosis without stenosis. Carotid Doppler study showed minimal plaque with less than 39% reduction. She  was sent home on atorvastatin 10 mg Plavix 75 mg in addition to her valsartan HCT 320/12.5 and zolpidem.  As I last saw her, she admits to increasing swelling in her legs and feet.  She also has continued issues with restless legs.  She denies any episodes of chest pain.  She is unaware of palpitations.  She admits to being fatigued and tired.  Past Medical History  Diagnosis Date  . Hypertension   . TIA (transient ischemic attack)   . IBS (irritable bowel syndrome)   . Stroke     Past Surgical  History  Procedure Laterality Date  . Bunionectomy  2006, 1996  . Laparoscopic salpingo oopherectomy  1988  . Abdominal hysterectomy  1978    Allergies  Allergen Reactions  . Erythromycin     Current Outpatient Prescriptions  Medication Sig Dispense Refill  . atorvastatin (LIPITOR) 10 MG tablet Take 10 mg by mouth daily. Patient takes 2 tablets daily    . Biotin 5000 MCG CAPS Take 1 capsule by mouth daily.    . Calcium Carb-Cholecalciferol (CALCIUM 600 + D) 600-200 MG-UNIT TABS Take 1 tablet by mouth 2 (two) times daily.    . Cholecalciferol (VITAMIN D) 2000 UNITS CAPS Take 1 capsule by mouth daily.    . clopidogrel (PLAVIX) 75 MG tablet Take 75 mg by mouth daily.    Marland Kitchen gabapentin (NEURONTIN) 100 MG capsule Take 1 capsule by mouth daily.    Marland Kitchen guaiFENesin (MUCINEX) 600 MG 12 hr tablet Take 1,200 mg by mouth daily as needed for congestion.    . metoprolol succinate (TOPROL XL) 25 MG 24 hr tablet Take 1/2 tablet daily. Do not take if heart rate is less than 55 bpm 30 tablet 3  . Multiple Vitamin (MULTIVITAMIN) capsule Take 1 capsule by mouth daily.    Marland Kitchen MYRBETRIQ 25 MG TB24 tablet Take 1 tablet by mouth 2 (two) times daily.    Marland Kitchen rOPINIRole (REQUIP) 0.5 MG tablet Take 1 tablet by mouth daily as needed. 1/2 tab bid as needed    . valsartan-hydrochlorothiazide (DIOVAN-HCT) 160-25 MG per tablet Take 1 tablet by mouth daily.  No current facility-administered medications for this visit.    History   Social History  . Marital Status: Married    Spouse Name: N/A  . Number of Children: N/A  . Years of Education: N/A   Occupational History  . Not on file.   Social History Main Topics  . Smoking status: Never Smoker   . Smokeless tobacco: Never Used  . Alcohol Use: No  . Drug Use: No  . Sexual Activity: Not on file   Other Topics Concern  . Not on file   Social History Narrative   Socially she is widowed as of December 2016.  Her husband was a former Scientist, physiological of education that  [employer] and developed progressive dementia.   Family History  Problem Relation Age of Onset  . COPD Mother   . Schizophrenia Brother   . Stroke Father    ROS General: Negative; No fevers, chills, or night sweats;  HEENT: Negative; No changes in vision or hearing, sinus congestion, difficulty swallowing Pulmonary: Negative; No cough, wheezing, shortness of breath, hemoptysis Cardiovascular: Negative; No chest pain, presyncope, syncope, palpitations Positive for leg swelling  GI: Negative; No nausea, vomiting, diarrhea, or abdominal pain GU: Positive for recurrent urinary incontinence despite her prior urologic surgery; No dysuria, hematuria, or difficulty voiding Musculoskeletal: Negative; no myalgias, joint pain, or weakness Hematologic/Oncology: Negative; no easy bruising, bleeding Endocrine: Negative; no heat/cold intolerance; no diabetes Neuro: Positive for decreased short-term memory and history of TIA; no changes in balance, headaches Skin: Negative; No rashes or skin lesions Psychiatric: Negative; No behavioral problems, depression Sleep: Negative; No snoring, daytime sleepiness, hypersomnolence, bruxism, restless legs, hypnogognic hallucinations, no cataplexy Other comprehensive 14 point system review is negative.   PE BP 130/70 mmHg  Ht $R'4\' 11"'vf$  (1.499 m)  Wt 141 lb (63.957 kg)  BMI 28.46 kg/m2  Repeat blood pressure by me 148/80 General: Alert, oriented, no distress.  Skin: normal turgor, no rashes HEENT: Normocephalic, atraumatic. Pupils round and reactive; sclera anicteric;no lid lag.  Nose without nasal septal hypertrophy Mouth/Parynx benign; Mallinpatti scale 2 Neck: No JVD, no carotid bruits Lungs: clear to ausculatation and percussion; no wheezing or rales Chest wall: Nontender to palpation Heart: RRR, s1 s2 normal 1/6 systolic murmur; no diastolic murmur.  No rubs thrills or heaves.  No ectopy. Abdomen: soft, nontender; no hepatosplenomehaly, BS+;  abdominal aorta nontender and not dilated by palpation. Back: No CVA tenderness Pulses 2+ Extremities: Trace to 1+ pretibial and ankle swelling.  no clubbing cyanosis , Homan's sign negative  Neurologic: grossly nonfocal Psychologic: Appropriately somber with her husband's recent death.  ECG (independently read by me): Sinus bradycardia 58 bpm.  Normal intervals.    October 2015 ECG (and apparently read by me) : Sinus rhythm with frequent PACs and transient atrial bigeminal pattern  Prior November 2014 ECG: Sinus rhythm with occasional PACs. Intervals are normal. Nonspecific T changes.  LABS: BMP Latest Ref Rng 01/28/2013 01/28/2013 04/29/2009  Glucose 70 - 99 mg/dL - 118(H) 96  BUN 6 - 23 mg/dL - 17 17  Creatinine 0.50 - 1.10 mg/dL 0.87 0.95 0.91  Sodium 135 - 145 mEq/L - 139 143  Potassium 3.5 - 5.1 mEq/L - 4.1 4.0  Chloride 96 - 112 mEq/L - 102 109  CO2 19 - 32 mEq/L - 26 29  Calcium 8.4 - 10.5 mg/dL - 10.0 8.6   Hepatic Function Latest Ref Rng 01/28/2013 04/28/2009  Total Protein 6.0 - 8.3 g/dL 7.4 5.8(L)  Albumin 3.5 - 5.2  g/dL 4.3 3.3(L)  AST 0 - 37 U/L 22 15  ALT 0 - 35 U/L 20 16  Alk Phosphatase 39 - 117 U/L 73 46  Total Bilirubin 0.3 - 1.2 mg/dL 0.4 0.4  Bilirubin, Direct 0.0 - 0.3 mg/dL - 0.2   CBC Latest Ref Rng 01/28/2013 01/28/2013 04/28/2009  WBC 4.0 - 10.5 K/uL 5.8 6.5 6.3  Hemoglobin 12.0 - 15.0 g/dL 13.4 14.0 12.1  Hematocrit 36.0 - 46.0 % 39.1 41.9 35.5(L)  Platelets 150 - 400 K/uL 170 202 161   No results found for: TSH   Lipid Panel     Component Value Date/Time   CHOL 118 01/29/2013 0520   TRIG 66 01/29/2013 0520   HDL 66 01/29/2013 0520   CHOLHDL 1.8 01/29/2013 0520   VLDL 13 01/29/2013 0520   LDLCALC 39 01/29/2013 0520     RADIOLOGY: Ct Head (brain) Wo Contrast  01/28/2013   CLINICAL DATA:  Right upper extremity weakness and numbness  EXAM: CT HEAD WITHOUT CONTRAST  TECHNIQUE: Contiguous axial images were obtained from the base of the skull  through the vertex without intravenous contrast. Study was obtained within 24 hr of patient's arrival at the emergency department.  COMPARISON:  Brain MRI April 27, 2009 and brain CT April 27, 2009  FINDINGS: There is mild diffuse atrophy. There is no mass, hemorrhage, extra-axial fluid collection, or midline shift. There is extensive small vessel disease throughout the centra semiovale bilaterally, a stable finding. There is no new gray-white compartment lesion. There is no demonstrable acute infarct. Bony calvarium appears intact. The mastoid air cells are clear.  IMPRESSION: Mild atrophy with extensive supratentorial small vessel disease. No intracranial mass, hemorrhage, or acute appearing infarct.   Electronically Signed   By: Lowella Grip M.D.   On: 01/28/2013 14:42   Mr Brain Wo Contrast  01/28/2013   CLINICAL DATA:  Numbness in the arms. Dry hacking cough. History of hypertension and dyslipidemia.  EXAM: MRI HEAD WITHOUT CONTRAST  MRA HEAD WITHOUT CONTRAST  TECHNIQUE: Multiplanar, multiecho pulse sequences of the brain and surrounding structures were obtained without intravenous contrast. Angiographic images of the head were obtained using MRA technique without contrast.  COMPARISON:  CT 01/28/2013.  FINDINGS: MRI HEAD FINDINGS  The patient was unable to remain motionless for the exam. Small or subtle lesions could be overlooked.  No evidence for acute infarction, hemorrhage, mass lesion, hydrocephalus, or extra-axial fluid. Moderate age-related atrophy. Extensive chronic microvascular ischemic change. Basal ganglia mineralization but no foci of chronic hemorrhage. Flow voids are maintained. No osseous findings. No remote large vessel infarct. Bilateral cataract extraction. No acute sinus or mastoid fluid. Good general agreement with prior CT.  MRA HEAD FINDINGS  Grossly patent internal carotid arteries, and basilar artery. Vertebrals are codominant. No flow-limiting intracranial stenosis,  branch occlusion, or aneurysm is evident on this motion degraded exam.  IMPRESSION: MRI HEAD IMPRESSION  No acute intracranial findings are evident. There is advanced atrophy with chronic microvascular ischemic change.  MRA HEAD IMPRESSION  No intracranial flow reducing lesion is evident.   Electronically Signed   By: Rolla Flatten M.D.   On: 01/28/2013 18:47   Mr Jodene Nam Head/brain Wo Cm  01/28/2013   CLINICAL DATA:  Numbness in the arms. Dry hacking cough. History of hypertension and dyslipidemia.  EXAM: MRI HEAD WITHOUT CONTRAST  MRA HEAD WITHOUT CONTRAST  TECHNIQUE: Multiplanar, multiecho pulse sequences of the brain and surrounding structures were obtained without intravenous contrast. Angiographic images of the head  were obtained using MRA technique without contrast.  COMPARISON:  CT 01/28/2013.  FINDINGS: MRI HEAD FINDINGS  The patient was unable to remain motionless for the exam. Small or subtle lesions could be overlooked.  No evidence for acute infarction, hemorrhage, mass lesion, hydrocephalus, or extra-axial fluid. Moderate age-related atrophy. Extensive chronic microvascular ischemic change. Basal ganglia mineralization but no foci of chronic hemorrhage. Flow voids are maintained. No osseous findings. No remote large vessel infarct. Bilateral cataract extraction. No acute sinus or mastoid fluid. Good general agreement with prior CT.  MRA HEAD FINDINGS  Grossly patent internal carotid arteries, and basilar artery. Vertebrals are codominant. No flow-limiting intracranial stenosis, branch occlusion, or aneurysm is evident on this motion degraded exam.  IMPRESSION: MRI HEAD IMPRESSION  No acute intracranial findings are evident. There is advanced atrophy with chronic microvascular ischemic change.  MRA HEAD IMPRESSION  No intracranial flow reducing lesion is evident.   Electronically Signed   By: Rolla Flatten M.D.   On: 01/28/2013 18:47      ASSESSMENT AND PLAN: Ms. Lippard is an 81 years old with  history of prior TIA's and remotely had been  diagnosed as having a TIA with old asymptomatic lacunar infarct. On CT, MRI and MRA studies there is  significant small vessel disease of her brain.  She does have normal systolic function and evidence for aortic sclerosis on echo assessment. She is on Plavix with her TIA history.  Recently, her valsartan HCTwas changed by her primary physician to valsartan HCT 160/25 mg.  She continues to note some leg swelling.  She admits to fatigue.  She is taking only Toprol-XL 12.5 mg.  Her resting pulse is 58.  She continues to be on atorvastatin 10 mg daily for hyperlipidemia.  Presently she is taking rec with 0.5 mg one half twice a day for restless legs.  If this persists.  This dose may need to be further titrated.  I am recommending follow-up chemistry be obtained with a CMET, TSH, CBC, lipid panel.  I have recommended support stockings to help reduce her edema.  Her diuretic regimen was recently increased by her primary physician.  A long discussion with her regarding her husband and his recent death.  I will see her in 6 months for reevaluation.  Time spent: 25 minutes   Troy Sine, MD, [facility]  08/05/2014 6:53 PM

## 2014-08-28 ENCOUNTER — Encounter: Payer: Self-pay | Admitting: Cardiovascular Disease

## 2014-09-25 DIAGNOSIS — G2581 Restless legs syndrome: Secondary | ICD-10-CM | POA: Diagnosis not present

## 2014-09-25 DIAGNOSIS — R42 Dizziness and giddiness: Secondary | ICD-10-CM | POA: Diagnosis not present

## 2014-09-25 DIAGNOSIS — R5383 Other fatigue: Secondary | ICD-10-CM | POA: Diagnosis not present

## 2014-09-25 DIAGNOSIS — N3946 Mixed incontinence: Secondary | ICD-10-CM | POA: Diagnosis not present

## 2014-09-25 DIAGNOSIS — R600 Localized edema: Secondary | ICD-10-CM | POA: Diagnosis not present

## 2014-10-02 DIAGNOSIS — H8113 Benign paroxysmal vertigo, bilateral: Secondary | ICD-10-CM | POA: Diagnosis not present

## 2014-10-02 DIAGNOSIS — R260 Ataxic gait: Secondary | ICD-10-CM | POA: Diagnosis not present

## 2014-10-02 DIAGNOSIS — M6281 Muscle weakness (generalized): Secondary | ICD-10-CM | POA: Diagnosis not present

## 2014-10-07 DIAGNOSIS — H8113 Benign paroxysmal vertigo, bilateral: Secondary | ICD-10-CM | POA: Diagnosis not present

## 2014-10-07 DIAGNOSIS — R260 Ataxic gait: Secondary | ICD-10-CM | POA: Diagnosis not present

## 2014-10-07 DIAGNOSIS — M6281 Muscle weakness (generalized): Secondary | ICD-10-CM | POA: Diagnosis not present

## 2014-10-08 DIAGNOSIS — R3915 Urgency of urination: Secondary | ICD-10-CM | POA: Diagnosis not present

## 2014-10-08 DIAGNOSIS — K5901 Slow transit constipation: Secondary | ICD-10-CM | POA: Diagnosis not present

## 2014-10-09 DIAGNOSIS — H8113 Benign paroxysmal vertigo, bilateral: Secondary | ICD-10-CM | POA: Diagnosis not present

## 2014-10-09 DIAGNOSIS — R260 Ataxic gait: Secondary | ICD-10-CM | POA: Diagnosis not present

## 2014-10-09 DIAGNOSIS — M6281 Muscle weakness (generalized): Secondary | ICD-10-CM | POA: Diagnosis not present

## 2014-10-14 DIAGNOSIS — R260 Ataxic gait: Secondary | ICD-10-CM | POA: Diagnosis not present

## 2014-10-14 DIAGNOSIS — H8113 Benign paroxysmal vertigo, bilateral: Secondary | ICD-10-CM | POA: Diagnosis not present

## 2014-10-14 DIAGNOSIS — M6281 Muscle weakness (generalized): Secondary | ICD-10-CM | POA: Diagnosis not present

## 2014-10-17 DIAGNOSIS — M6281 Muscle weakness (generalized): Secondary | ICD-10-CM | POA: Diagnosis not present

## 2014-10-17 DIAGNOSIS — R260 Ataxic gait: Secondary | ICD-10-CM | POA: Diagnosis not present

## 2014-10-17 DIAGNOSIS — H8113 Benign paroxysmal vertigo, bilateral: Secondary | ICD-10-CM | POA: Diagnosis not present

## 2014-10-27 DIAGNOSIS — M6281 Muscle weakness (generalized): Secondary | ICD-10-CM | POA: Diagnosis not present

## 2014-10-27 DIAGNOSIS — R260 Ataxic gait: Secondary | ICD-10-CM | POA: Diagnosis not present

## 2014-10-27 DIAGNOSIS — H8113 Benign paroxysmal vertigo, bilateral: Secondary | ICD-10-CM | POA: Diagnosis not present

## 2014-11-06 DIAGNOSIS — R260 Ataxic gait: Secondary | ICD-10-CM | POA: Diagnosis not present

## 2014-11-06 DIAGNOSIS — H8113 Benign paroxysmal vertigo, bilateral: Secondary | ICD-10-CM | POA: Diagnosis not present

## 2014-11-06 DIAGNOSIS — M6281 Muscle weakness (generalized): Secondary | ICD-10-CM | POA: Diagnosis not present

## 2014-11-10 DIAGNOSIS — H8113 Benign paroxysmal vertigo, bilateral: Secondary | ICD-10-CM | POA: Diagnosis not present

## 2014-11-10 DIAGNOSIS — R260 Ataxic gait: Secondary | ICD-10-CM | POA: Diagnosis not present

## 2014-11-10 DIAGNOSIS — M6281 Muscle weakness (generalized): Secondary | ICD-10-CM | POA: Diagnosis not present

## 2014-11-13 DIAGNOSIS — H8113 Benign paroxysmal vertigo, bilateral: Secondary | ICD-10-CM | POA: Diagnosis not present

## 2014-11-13 DIAGNOSIS — R260 Ataxic gait: Secondary | ICD-10-CM | POA: Diagnosis not present

## 2014-11-13 DIAGNOSIS — M6281 Muscle weakness (generalized): Secondary | ICD-10-CM | POA: Diagnosis not present

## 2014-11-17 DIAGNOSIS — M6281 Muscle weakness (generalized): Secondary | ICD-10-CM | POA: Diagnosis not present

## 2014-11-17 DIAGNOSIS — H8113 Benign paroxysmal vertigo, bilateral: Secondary | ICD-10-CM | POA: Diagnosis not present

## 2014-11-17 DIAGNOSIS — R260 Ataxic gait: Secondary | ICD-10-CM | POA: Diagnosis not present

## 2014-11-18 ENCOUNTER — Other Ambulatory Visit: Payer: Self-pay

## 2014-11-18 DIAGNOSIS — Z1231 Encounter for screening mammogram for malignant neoplasm of breast: Secondary | ICD-10-CM

## 2014-11-24 ENCOUNTER — Ambulatory Visit
Admission: RE | Admit: 2014-11-24 | Discharge: 2014-11-24 | Disposition: A | Discharge status: Home / Self Care | Payer: Medicare Other | Source: Ambulatory Visit

## 2014-11-24 DIAGNOSIS — Z1231 Encounter for screening mammogram for malignant neoplasm of breast: Secondary | ICD-10-CM

## 2014-11-28 DIAGNOSIS — M6281 Muscle weakness (generalized): Secondary | ICD-10-CM | POA: Diagnosis not present

## 2014-11-28 DIAGNOSIS — R260 Ataxic gait: Secondary | ICD-10-CM | POA: Diagnosis not present

## 2014-11-28 DIAGNOSIS — H8113 Benign paroxysmal vertigo, bilateral: Secondary | ICD-10-CM | POA: Diagnosis not present

## 2014-12-03 DIAGNOSIS — M6281 Muscle weakness (generalized): Secondary | ICD-10-CM | POA: Diagnosis not present

## 2014-12-03 DIAGNOSIS — H8113 Benign paroxysmal vertigo, bilateral: Secondary | ICD-10-CM | POA: Diagnosis not present

## 2014-12-03 DIAGNOSIS — R260 Ataxic gait: Secondary | ICD-10-CM | POA: Diagnosis not present

## 2014-12-04 ENCOUNTER — Other Ambulatory Visit: Payer: Self-pay | Admitting: Cardiovascular Disease

## 2014-12-04 NOTE — Telephone Encounter (Signed)
Rx request sent to pharmacy.  

## 2014-12-05 DIAGNOSIS — H8113 Benign paroxysmal vertigo, bilateral: Secondary | ICD-10-CM | POA: Diagnosis not present

## 2014-12-05 DIAGNOSIS — M6281 Muscle weakness (generalized): Secondary | ICD-10-CM | POA: Diagnosis not present

## 2014-12-05 DIAGNOSIS — R260 Ataxic gait: Secondary | ICD-10-CM | POA: Diagnosis not present

## 2014-12-10 DIAGNOSIS — H8113 Benign paroxysmal vertigo, bilateral: Secondary | ICD-10-CM | POA: Diagnosis not present

## 2014-12-10 DIAGNOSIS — R260 Ataxic gait: Secondary | ICD-10-CM | POA: Diagnosis not present

## 2014-12-10 DIAGNOSIS — M6281 Muscle weakness (generalized): Secondary | ICD-10-CM | POA: Diagnosis not present

## 2014-12-12 DIAGNOSIS — M6281 Muscle weakness (generalized): Secondary | ICD-10-CM | POA: Diagnosis not present

## 2014-12-12 DIAGNOSIS — H8113 Benign paroxysmal vertigo, bilateral: Secondary | ICD-10-CM | POA: Diagnosis not present

## 2014-12-12 DIAGNOSIS — R260 Ataxic gait: Secondary | ICD-10-CM | POA: Diagnosis not present

## 2014-12-15 DIAGNOSIS — M6281 Muscle weakness (generalized): Secondary | ICD-10-CM | POA: Diagnosis not present

## 2014-12-15 DIAGNOSIS — H8113 Benign paroxysmal vertigo, bilateral: Secondary | ICD-10-CM | POA: Diagnosis not present

## 2014-12-15 DIAGNOSIS — R260 Ataxic gait: Secondary | ICD-10-CM | POA: Diagnosis not present

## 2014-12-19 DIAGNOSIS — E780 Pure hypercholesterolemia: Secondary | ICD-10-CM | POA: Diagnosis not present

## 2014-12-19 DIAGNOSIS — Z0000 Encounter for general adult medical examination without abnormal findings: Secondary | ICD-10-CM | POA: Diagnosis not present

## 2014-12-19 DIAGNOSIS — I10 Essential (primary) hypertension: Secondary | ICD-10-CM | POA: Diagnosis not present

## 2014-12-25 DIAGNOSIS — Z8673 Personal history of transient ischemic attack (TIA), and cerebral infarction without residual deficits: Secondary | ICD-10-CM | POA: Diagnosis not present

## 2014-12-25 DIAGNOSIS — R159 Full incontinence of feces: Secondary | ICD-10-CM | POA: Diagnosis not present

## 2014-12-25 DIAGNOSIS — M810 Age-related osteoporosis without current pathological fracture: Secondary | ICD-10-CM | POA: Diagnosis not present

## 2014-12-25 DIAGNOSIS — E538 Deficiency of other specified B group vitamins: Secondary | ICD-10-CM | POA: Diagnosis not present

## 2014-12-29 DIAGNOSIS — M6281 Muscle weakness (generalized): Secondary | ICD-10-CM | POA: Diagnosis not present

## 2014-12-29 DIAGNOSIS — R260 Ataxic gait: Secondary | ICD-10-CM | POA: Diagnosis not present

## 2014-12-29 DIAGNOSIS — H8113 Benign paroxysmal vertigo, bilateral: Secondary | ICD-10-CM | POA: Diagnosis not present

## 2014-12-31 DIAGNOSIS — H8113 Benign paroxysmal vertigo, bilateral: Secondary | ICD-10-CM | POA: Diagnosis not present

## 2014-12-31 DIAGNOSIS — M6281 Muscle weakness (generalized): Secondary | ICD-10-CM | POA: Diagnosis not present

## 2014-12-31 DIAGNOSIS — R260 Ataxic gait: Secondary | ICD-10-CM | POA: Diagnosis not present

## 2015-01-05 DIAGNOSIS — H8113 Benign paroxysmal vertigo, bilateral: Secondary | ICD-10-CM | POA: Diagnosis not present

## 2015-01-05 DIAGNOSIS — M6281 Muscle weakness (generalized): Secondary | ICD-10-CM | POA: Diagnosis not present

## 2015-01-05 DIAGNOSIS — R260 Ataxic gait: Secondary | ICD-10-CM | POA: Diagnosis not present

## 2015-01-07 DIAGNOSIS — H8113 Benign paroxysmal vertigo, bilateral: Secondary | ICD-10-CM | POA: Diagnosis not present

## 2015-01-07 DIAGNOSIS — R260 Ataxic gait: Secondary | ICD-10-CM | POA: Diagnosis not present

## 2015-01-07 DIAGNOSIS — M6281 Muscle weakness (generalized): Secondary | ICD-10-CM | POA: Diagnosis not present

## 2015-01-12 DIAGNOSIS — R260 Ataxic gait: Secondary | ICD-10-CM | POA: Diagnosis not present

## 2015-01-12 DIAGNOSIS — H8113 Benign paroxysmal vertigo, bilateral: Secondary | ICD-10-CM | POA: Diagnosis not present

## 2015-01-12 DIAGNOSIS — M6281 Muscle weakness (generalized): Secondary | ICD-10-CM | POA: Diagnosis not present

## 2015-01-14 DIAGNOSIS — N3281 Overactive bladder: Secondary | ICD-10-CM | POA: Diagnosis not present

## 2015-01-14 DIAGNOSIS — K5901 Slow transit constipation: Secondary | ICD-10-CM | POA: Diagnosis not present

## 2015-01-14 DIAGNOSIS — Z09 Encounter for follow-up examination after completed treatment for conditions other than malignant neoplasm: Secondary | ICD-10-CM | POA: Diagnosis not present

## 2015-01-15 DIAGNOSIS — M6281 Muscle weakness (generalized): Secondary | ICD-10-CM | POA: Diagnosis not present

## 2015-01-15 DIAGNOSIS — H8113 Benign paroxysmal vertigo, bilateral: Secondary | ICD-10-CM | POA: Diagnosis not present

## 2015-01-15 DIAGNOSIS — R260 Ataxic gait: Secondary | ICD-10-CM | POA: Diagnosis not present

## 2015-01-19 DIAGNOSIS — M6281 Muscle weakness (generalized): Secondary | ICD-10-CM | POA: Diagnosis not present

## 2015-01-19 DIAGNOSIS — R260 Ataxic gait: Secondary | ICD-10-CM | POA: Diagnosis not present

## 2015-01-19 DIAGNOSIS — H8113 Benign paroxysmal vertigo, bilateral: Secondary | ICD-10-CM | POA: Diagnosis not present

## 2015-01-21 DIAGNOSIS — R260 Ataxic gait: Secondary | ICD-10-CM | POA: Diagnosis not present

## 2015-01-21 DIAGNOSIS — M6281 Muscle weakness (generalized): Secondary | ICD-10-CM | POA: Diagnosis not present

## 2015-01-21 DIAGNOSIS — H8113 Benign paroxysmal vertigo, bilateral: Secondary | ICD-10-CM | POA: Diagnosis not present

## 2015-02-04 DIAGNOSIS — H8113 Benign paroxysmal vertigo, bilateral: Secondary | ICD-10-CM | POA: Diagnosis not present

## 2015-02-04 DIAGNOSIS — R260 Ataxic gait: Secondary | ICD-10-CM | POA: Diagnosis not present

## 2015-02-04 DIAGNOSIS — M6281 Muscle weakness (generalized): Secondary | ICD-10-CM | POA: Diagnosis not present

## 2015-02-06 ENCOUNTER — Encounter: Payer: Self-pay | Admitting: Cardiovascular Disease

## 2015-02-06 ENCOUNTER — Ambulatory Visit (INDEPENDENT_AMBULATORY_CARE_PROVIDER_SITE_OTHER): Payer: Medicare Other | Admitting: Cardiovascular Disease

## 2015-02-06 VITALS — BP 114/60 | HR 56 | Ht 59.75 in | Wt 133.8 lb

## 2015-02-06 DIAGNOSIS — I10 Essential (primary) hypertension: Secondary | ICD-10-CM | POA: Diagnosis not present

## 2015-02-06 DIAGNOSIS — R600 Localized edema: Secondary | ICD-10-CM | POA: Diagnosis not present

## 2015-02-06 DIAGNOSIS — R001 Bradycardia, unspecified: Secondary | ICD-10-CM

## 2015-02-06 DIAGNOSIS — R42 Dizziness and giddiness: Secondary | ICD-10-CM

## 2015-02-06 NOTE — Progress Notes (Signed)
Patient ID: Valerie Barker, female   DOB: Nov 16, 1922, 81 y.o.   MRN: [id number]   Primary M.D.: Dr. Deland Pretty  HPI: Valerie Barker is a 81 y.o. female who presents to the office for a 85monthcardiology followup evaluation.  Valerie Barker the wife of my former patient Dr. WBuckner Maltawho passed away on 12015-12-27  Remotely, Ms. BGallentinesuffered a TIA and has documented old asymptomatic lacunar infarcts. [facility] showed normal perfusion. An echo Doppler study demonstrated mild to moderate TR, trace MR, and mild aortic sclerosis without stenosis. She had normal systolic function. Carotid studies done in 2011 were normal.  She was  hospitalized for 2 nights after experiencing an TIA on 01/08/2013. Her symptoms resolved spontaneously. A CT of her head showed mild atrophy with extensive supratentorial small vessel disease without intracranial mass, hemorrhage or acute appearing infarction. An MRI of her brain did not show any acute intracranial findings but did show advanced atrophy with chronic microvascular ischemic changes. MRA did not reveal any intracranial flow reducing lesions. During that hospitalization a f/u echo Doppler study  on 01/29/2013 showed an ejection fraction at 65%. There was mild aortic sclerosis without stenosis. Carotid Doppler study showed minimal plaque with less than 39% reduction. She  was sent home on atorvastatin 10 mg Plavix 75 mg in addition to her valsartan HCT 320/12.5 and zolpidem.  When I last saw her, she planed of significant fatigue.  She was taking Toprol-XL 12.5 mg with a resting pulse in the upper 50s.  Her valsartan HCT dose was changed to 160/25 by Dr. PShelia Media  She denies any chest pain or shortness of breath.  She has noticed a "bobble head "sensation when she walks.  She denies frank syncope.  She recently had laboratory obtained by Dr. PShelia Mediaon 12/19/2014 which I have  reviewed.  Past Medical History  Diagnosis Date  . Hypertension   . TIA (transient ischemic attack)   . IBS (irritable bowel syndrome)   . Stroke     Past Surgical History  Procedure Laterality Date  . Bunionectomy  2006, 1996  . Laparoscopic salpingo oopherectomy  1988  . Abdominal hysterectomy  1978    Allergies  Allergen Reactions  . Erythromycin     Current Outpatient Prescriptions  Medication Sig Dispense Refill  . atorvastatin (LIPITOR) 10 MG tablet Take 5 mg by mouth daily.    . Biotin 5000 MCG CAPS Take 1 capsule by mouth daily.    . Calcium Carb-Cholecalciferol (CALCIUM 600 + D) 600-200 MG-UNIT TABS Take 1 tablet by mouth 2 (two) times daily.    . Cholecalciferol (VITAMIN D) 2000 UNITS CAPS Take 1 capsule by mouth daily.    . clopidogrel (PLAVIX) 75 MG tablet Take 75 mg by mouth daily.    .Marland Kitchengabapentin (NEURONTIN) 100 MG capsule Take 1 capsule by mouth daily.    .Marland KitchenguaiFENesin (MUCINEX) 600 MG 12 hr tablet Take 1,200 mg by mouth daily as needed for congestion.    . metoprolol succinate (TOPROL-XL) 25 MG 24 hr tablet TAKE 1/2 TABLET DAILY. DO NOT TAKE IF HEART RATE IS LESS THAN 55. 30 tablet 2  . Multiple Vitamin (MULTIVITAMIN) capsule Take 1 capsule by mouth daily.    .Marland KitchenMYRBETRIQ 25 MG TB24 tablet Take 1 tablet by mouth daily.     .Marland KitchenrOPINIRole (REQUIP) 0.5 MG tablet Take 1 tablet by mouth daily as needed. 1/2  tab bid as needed    . valsartan-hydrochlorothiazide (DIOVAN-HCT) 160-25 MG per tablet Take 1 tablet by mouth daily.     No current facility-administered medications for this visit.    Social History   Social History  . Marital Status: Married    Spouse Name: N/A  . Number of Children: N/A  . Years of Education: N/A   Occupational History  . Not on file.   Social History Main Topics  . Smoking status: Never Smoker   . Smokeless tobacco: Never Used  . Alcohol Use: No  . Drug Use: No  . Sexual Activity: Not on file   Other Topics Concern  . Not on  file   Social History Narrative   Socially she is widowed as of December 2016.  Her husband was a former Scientist, physiological of education that Valerie Barker and developed progressive dementia.   Family History  Problem Relation Age of Onset  . COPD Mother   . Schizophrenia Brother   . Stroke Father    ROS General: Negative; No fevers, chills, or night sweats;  HEENT: Negative; No changes in vision or hearing, sinus congestion, difficulty swallowing Pulmonary: Negative; No cough, wheezing, shortness of breath, hemoptysis Cardiovascular: Negative; No chest pain, presyncope, syncope, palpitations Positive for leg swelling  GI: Negative; No nausea, vomiting, diarrhea, or abdominal pain GU: Positive for recurrent urinary incontinence despite her prior urologic surgery; No dysuria, hematuria, or difficulty voiding Musculoskeletal: Negative; no myalgias, joint pain, or weakness Hematologic/Oncology: Negative; no easy bruising, bleeding Endocrine: Negative; no heat/cold intolerance; no diabetes Neuro: Positive for decreased short-term memory and history of TIA; no changes in balance, headaches Skin: Negative; No rashes or skin lesions Psychiatric: Negative; No behavioral problems, depression Sleep: Negative; No snoring, daytime sleepiness, hypersomnolence, bruxism, restless legs, hypnogognic hallucinations, no cataplexy Other comprehensive 14 point system review is negative.   PE BP 114/60 mmHg  Pulse 56  Ht 4' 11.75" (1.518 m)  Wt 133 lb 12.8 oz (60.691 kg)  BMI 26.34 kg/m2  Repeat blood pressure by me was 120/80 supine and 128/80 standing without orthostatic pulse rise.  Wt Readings from Last 3 Encounters:  02/06/15 133 lb 12.8 oz (60.691 kg)  08/04/14 141 lb (63.957 kg)  01/29/14 132 lb 8 oz (60.102 kg)    General: Alert, oriented, no distress.  Skin: normal turgor, no rashes HEENT: Normocephalic, atraumatic. Pupils round and reactive; sclera anicteric;no lid lag.  Nose without nasal  septal hypertrophy Mouth/Parynx benign; Mallinpatti scale 2 Neck: No JVD, no carotid bruits Lungs: clear to ausculatation and percussion; no wheezing or rales Chest wall: Nontender to palpation Heart: RRR, s1 s2 normal 1/6 systolic murmur; no diastolic murmur.  No rubs thrills or heaves.  No ectopy. Abdomen: soft, nontender; no hepatosplenomehaly, BS+; abdominal aorta nontender and not dilated by palpation. Back: No CVA tenderness Pulses 2+ Extremities: Trace to 1+ pretibial and ankle swelling.  no clubbing cyanosis , Homan's sign negative  Neurologic: grossly nonfocal Psychologic: Appropriately somber with her husband's recent death.  ECG (independently read by me): Sinus bradycardia at 56 bpm.  No ectopy.  Normal intervals.  May 2016 ECG (independently read by me): Sinus bradycardia 58 bpm.  Normal intervals.    October 2015 ECG (and apparently read by me) : Sinus rhythm with frequent PACs and transient atrial bigeminal pattern  Prior November 2014 ECG: Sinus rhythm with occasional PACs. Intervals are normal. Nonspecific T changes.  LABS: BMP Latest Ref Rng 01/28/2013 01/28/2013 04/29/2009  Glucose 70 - 99  mg/dL - 118(H) 96  BUN 6 - 23 mg/dL - 17 17  Creatinine 0.50 - 1.10 mg/dL 0.87 0.95 0.91  Sodium 135 - 145 mEq/L - 139 143  Potassium 3.5 - 5.1 mEq/L - 4.1 4.0  Chloride 96 - 112 mEq/L - 102 109  CO2 19 - 32 mEq/L - 26 29  Calcium 8.4 - 10.5 mg/dL - 10.0 8.6   Hepatic Function Latest Ref Rng 01/28/2013 04/28/2009  Total Protein 6.0 - 8.3 g/dL 7.4 5.8(L)  Albumin 3.5 - 5.2 g/dL 4.3 3.3(L)  AST 0 - 37 U/L 22 15  ALT 0 - 35 U/L 20 16  Alk Phosphatase 39 - 117 U/L 73 46  Total Bilirubin 0.3 - 1.2 mg/dL 0.4 0.4  Bilirubin, Direct 0.0 - 0.3 mg/dL - 0.2   CBC Latest Ref Rng 01/28/2013 01/28/2013 04/28/2009  WBC 4.0 - 10.5 K/uL 5.8 6.5 6.3  Hemoglobin 12.0 - 15.0 g/dL 13.4 14.0 12.1  Hematocrit 36.0 - 46.0 % 39.1 41.9 35.5(L)  Platelets 150 - 400 K/uL 170 202 161   No results  found for: TSH   Lipid Panel     Component Value Date/Time   CHOL 118 01/29/2013 0520   TRIG 66 01/29/2013 0520   HDL 66 01/29/2013 0520   CHOLHDL 1.8 01/29/2013 0520   VLDL 13 01/29/2013 0520   LDLCALC 39 01/29/2013 0520   I personally reviewed.  Laboratory from 12/19/2014 by Dr. Deland Pretty. Hemoglobin/hematocrit 13.2/40.3.  BUN/creatinine 13/0.99. Lipid studies: Total cholesterol 104, HDL 80, LDL 11, triglycerides 63.  RADIOLOGY: Ct Head (brain) Wo Contrast  01/28/2013   CLINICAL DATA:  Right upper extremity weakness and numbness  EXAM: CT HEAD WITHOUT CONTRAST  TECHNIQUE: Contiguous axial images were obtained from the base of the skull through the vertex without intravenous contrast. Study was obtained within 24 hr of patient's arrival at the emergency department.  COMPARISON:  Brain MRI April 27, 2009 and brain CT April 27, 2009  FINDINGS: There is mild diffuse atrophy. There is no mass, hemorrhage, extra-axial fluid collection, or midline shift. There is extensive small vessel disease throughout the centra semiovale bilaterally, a stable finding. There is no new gray-white compartment lesion. There is no demonstrable acute infarct. Bony calvarium appears intact. The mastoid air cells are clear.  IMPRESSION: Mild atrophy with extensive supratentorial small vessel disease. No intracranial mass, hemorrhage, or acute appearing infarct.   Electronically Signed   By: Lowella Grip M.D.   On: 01/28/2013 14:42   Mr Brain Wo Contrast  01/28/2013   CLINICAL DATA:  Numbness in the arms. Dry hacking cough. History of hypertension and dyslipidemia.  EXAM: MRI HEAD WITHOUT CONTRAST  MRA HEAD WITHOUT CONTRAST  TECHNIQUE: Multiplanar, multiecho pulse sequences of the brain and surrounding structures were obtained without intravenous contrast. Angiographic images of the head were obtained using MRA technique without contrast.  COMPARISON:  CT 01/28/2013.  FINDINGS: MRI HEAD FINDINGS  The patient  was unable to remain motionless for the exam. Small or subtle lesions could be overlooked.  No evidence for acute infarction, hemorrhage, mass lesion, hydrocephalus, or extra-axial fluid. Moderate age-related atrophy. Extensive chronic microvascular ischemic change. Basal ganglia mineralization but no foci of chronic hemorrhage. Flow voids are maintained. No osseous findings. No remote large vessel infarct. Bilateral cataract extraction. No acute sinus or mastoid fluid. Good general agreement with prior CT.  MRA HEAD FINDINGS  Grossly patent internal carotid arteries, and basilar artery. Vertebrals are codominant. No flow-limiting intracranial stenosis, branch occlusion, or  aneurysm is evident on this motion degraded exam.  IMPRESSION: MRI HEAD IMPRESSION  No acute intracranial findings are evident. There is advanced atrophy with chronic microvascular ischemic change.  MRA HEAD IMPRESSION  No intracranial flow reducing lesion is evident.   Electronically Signed   By: Rolla Flatten M.D.   On: 01/28/2013 18:47   Mr Jodene Nam Head/brain Wo Cm  01/28/2013   CLINICAL DATA:  Numbness in the arms. Dry hacking cough. History of hypertension and dyslipidemia.  EXAM: MRI HEAD WITHOUT CONTRAST  MRA HEAD WITHOUT CONTRAST  TECHNIQUE: Multiplanar, multiecho pulse sequences of the brain and surrounding structures were obtained without intravenous contrast. Angiographic images of the head were obtained using MRA technique without contrast.  COMPARISON:  CT 01/28/2013.  FINDINGS: MRI HEAD FINDINGS  The patient was unable to remain motionless for the exam. Small or subtle lesions could be overlooked.  No evidence for acute infarction, hemorrhage, mass lesion, hydrocephalus, or extra-axial fluid. Moderate age-related atrophy. Extensive chronic microvascular ischemic change. Basal ganglia mineralization but no foci of chronic hemorrhage. Flow voids are maintained. No osseous findings. No remote large vessel infarct. Bilateral cataract  extraction. No acute sinus or mastoid fluid. Good general agreement with prior CT.  MRA HEAD FINDINGS  Grossly patent internal carotid arteries, and basilar artery. Vertebrals are codominant. No flow-limiting intracranial stenosis, branch occlusion, or aneurysm is evident on this motion degraded exam.  IMPRESSION: MRI HEAD IMPRESSION  No acute intracranial findings are evident. There is advanced atrophy with chronic microvascular ischemic change.  MRA HEAD IMPRESSION  No intracranial flow reducing lesion is evident.   Electronically Signed   By: Rolla Flatten M.D.   On: 01/28/2013 18:47      ASSESSMENT AND PLAN: Ms. Roselli is an 81 years old with history of prior TIA's and remotely had been  diagnosed as having a TIA with old asymptomatic lacunar infarct. On CT, MRI and MRA studies there is significant small vessel disease of her brain.  She does have normal systolic function and evidence for aortic sclerosis on echo assessment. She is on Plavix with her TIA history.  Her blood pressure today is well controlled and she is without orthostatic drop.  She continues to experience a "bobble" sensation when she walks.  I'm scheduling her for carotid duplex imaging to assess carotid and vertebrobasilar blood flow.  I reviewed her recent lab work.  Her LDL cholesterol is now 11.  I have suggested that she reduce her atorvastatin to 5 mg daily and if this continues to be low.  This may be discontinued altogether.  She is still grieving the loss of her husband, which is more painful, particularly as the holidays approach.  She's not having any palpitations or chest pain symptomatology.  I will see her in 6 months for reevaluation.  Time spent: 25 minutes   Troy Sine, MD, [facility]  02/06/2015 1:03 PM

## 2015-02-06 NOTE — Patient Instructions (Signed)
Your physician has recommended you make the following change in your medication: CUT THE ATORVASTATIN TO 1/2 TABLET DAILY.  Your physician has requested that you have a carotid duplex. This test is an ultrasound of the carotid arteries in your neck. It looks at blood flow through these arteries that supply the brain with blood. Allow one hour for this exam. There are no restrictions or special instructions.  Your physician wants you to follow-up in: 6 months or sooner if needed. You will receive a reminder letter in the mail two months in advance. If you don't receive a letter, please call our office to schedule the follow-up appointment.  If you need a refill on your cardiac medications before your next appointment, please call your pharmacy.

## 2015-02-09 DIAGNOSIS — H8113 Benign paroxysmal vertigo, bilateral: Secondary | ICD-10-CM | POA: Diagnosis not present

## 2015-02-09 DIAGNOSIS — R260 Ataxic gait: Secondary | ICD-10-CM | POA: Diagnosis not present

## 2015-02-09 DIAGNOSIS — M6281 Muscle weakness (generalized): Secondary | ICD-10-CM | POA: Diagnosis not present

## 2015-02-11 ENCOUNTER — Other Ambulatory Visit: Payer: Self-pay | Admitting: Cardiovascular Disease

## 2015-02-11 ENCOUNTER — Encounter: Payer: Self-pay | Admitting: Cardiovascular Disease

## 2015-02-11 MED ORDER — ATORVASTATIN CALCIUM 10 MG PO TABS: 5.0000 mg | ORAL_TABLET | Freq: Every day | ORAL | Status: DC

## 2015-02-13 ENCOUNTER — Ambulatory Visit (HOSPITAL_COMMUNITY)
Admission: RE | Admit: 2015-02-13 | Discharge: 2015-02-13 | Disposition: A | Discharge status: Home / Self Care | Payer: Medicare Other | Source: Ambulatory Visit | Attending: Cardiology | Admitting: Cardiology

## 2015-02-13 DIAGNOSIS — R42 Dizziness and giddiness: Secondary | ICD-10-CM | POA: Insufficient documentation

## 2015-02-13 DIAGNOSIS — I10 Essential (primary) hypertension: Secondary | ICD-10-CM | POA: Insufficient documentation

## 2015-02-16 DIAGNOSIS — M6281 Muscle weakness (generalized): Secondary | ICD-10-CM | POA: Diagnosis not present

## 2015-02-16 DIAGNOSIS — R260 Ataxic gait: Secondary | ICD-10-CM | POA: Diagnosis not present

## 2015-02-16 DIAGNOSIS — H8113 Benign paroxysmal vertigo, bilateral: Secondary | ICD-10-CM | POA: Diagnosis not present

## 2015-02-18 DIAGNOSIS — R05 Cough: Secondary | ICD-10-CM | POA: Diagnosis not present

## 2015-02-18 DIAGNOSIS — R490 Dysphonia: Secondary | ICD-10-CM | POA: Diagnosis not present

## 2015-02-18 DIAGNOSIS — R5383 Other fatigue: Secondary | ICD-10-CM | POA: Diagnosis not present

## 2015-02-18 DIAGNOSIS — N390 Urinary tract infection, site not specified: Secondary | ICD-10-CM | POA: Diagnosis not present

## 2015-02-19 ENCOUNTER — Encounter: Payer: Self-pay | Admitting: *Deleted

## 2015-03-03 DIAGNOSIS — H8113 Benign paroxysmal vertigo, bilateral: Secondary | ICD-10-CM | POA: Diagnosis not present

## 2015-03-03 DIAGNOSIS — M6281 Muscle weakness (generalized): Secondary | ICD-10-CM | POA: Diagnosis not present

## 2015-03-03 DIAGNOSIS — R260 Ataxic gait: Secondary | ICD-10-CM | POA: Diagnosis not present

## 2015-03-04 DIAGNOSIS — R05 Cough: Secondary | ICD-10-CM | POA: Diagnosis not present

## 2015-03-04 DIAGNOSIS — N390 Urinary tract infection, site not specified: Secondary | ICD-10-CM | POA: Diagnosis not present

## 2015-03-04 DIAGNOSIS — M545 Low back pain: Secondary | ICD-10-CM | POA: Diagnosis not present

## 2015-03-04 DIAGNOSIS — R490 Dysphonia: Secondary | ICD-10-CM | POA: Diagnosis not present

## 2015-03-05 DIAGNOSIS — M545 Low back pain: Secondary | ICD-10-CM | POA: Diagnosis not present

## 2015-03-11 DIAGNOSIS — R260 Ataxic gait: Secondary | ICD-10-CM | POA: Diagnosis not present

## 2015-03-11 DIAGNOSIS — H8113 Benign paroxysmal vertigo, bilateral: Secondary | ICD-10-CM | POA: Diagnosis not present

## 2015-03-11 DIAGNOSIS — M6281 Muscle weakness (generalized): Secondary | ICD-10-CM | POA: Diagnosis not present

## 2015-03-17 DIAGNOSIS — H8113 Benign paroxysmal vertigo, bilateral: Secondary | ICD-10-CM | POA: Diagnosis not present

## 2015-03-17 DIAGNOSIS — R260 Ataxic gait: Secondary | ICD-10-CM | POA: Diagnosis not present

## 2015-03-17 DIAGNOSIS — M6281 Muscle weakness (generalized): Secondary | ICD-10-CM | POA: Diagnosis not present

## 2015-03-19 DIAGNOSIS — R260 Ataxic gait: Secondary | ICD-10-CM | POA: Diagnosis not present

## 2015-03-19 DIAGNOSIS — M6281 Muscle weakness (generalized): Secondary | ICD-10-CM | POA: Diagnosis not present

## 2015-03-19 DIAGNOSIS — H8113 Benign paroxysmal vertigo, bilateral: Secondary | ICD-10-CM | POA: Diagnosis not present

## 2015-04-09 DIAGNOSIS — H8113 Benign paroxysmal vertigo, bilateral: Secondary | ICD-10-CM | POA: Diagnosis not present

## 2015-04-09 DIAGNOSIS — R260 Ataxic gait: Secondary | ICD-10-CM | POA: Diagnosis not present

## 2015-04-09 DIAGNOSIS — M6281 Muscle weakness (generalized): Secondary | ICD-10-CM | POA: Diagnosis not present

## 2015-04-15 DIAGNOSIS — M6281 Muscle weakness (generalized): Secondary | ICD-10-CM | POA: Diagnosis not present

## 2015-04-15 DIAGNOSIS — H8113 Benign paroxysmal vertigo, bilateral: Secondary | ICD-10-CM | POA: Diagnosis not present

## 2015-04-15 DIAGNOSIS — R260 Ataxic gait: Secondary | ICD-10-CM | POA: Diagnosis not present

## 2015-04-20 DIAGNOSIS — R260 Ataxic gait: Secondary | ICD-10-CM | POA: Diagnosis not present

## 2015-04-20 DIAGNOSIS — M6281 Muscle weakness (generalized): Secondary | ICD-10-CM | POA: Diagnosis not present

## 2015-04-20 DIAGNOSIS — H8113 Benign paroxysmal vertigo, bilateral: Secondary | ICD-10-CM | POA: Diagnosis not present

## 2015-04-22 DIAGNOSIS — M6281 Muscle weakness (generalized): Secondary | ICD-10-CM | POA: Diagnosis not present

## 2015-04-22 DIAGNOSIS — R260 Ataxic gait: Secondary | ICD-10-CM | POA: Diagnosis not present

## 2015-04-22 DIAGNOSIS — H8113 Benign paroxysmal vertigo, bilateral: Secondary | ICD-10-CM | POA: Diagnosis not present

## 2015-04-27 DIAGNOSIS — R260 Ataxic gait: Secondary | ICD-10-CM | POA: Diagnosis not present

## 2015-04-27 DIAGNOSIS — M6281 Muscle weakness (generalized): Secondary | ICD-10-CM | POA: Diagnosis not present

## 2015-04-27 DIAGNOSIS — H8113 Benign paroxysmal vertigo, bilateral: Secondary | ICD-10-CM | POA: Diagnosis not present

## 2015-05-11 DIAGNOSIS — Z961 Presence of intraocular lens: Secondary | ICD-10-CM | POA: Diagnosis not present

## 2015-05-11 DIAGNOSIS — H04123 Dry eye syndrome of bilateral lacrimal glands: Secondary | ICD-10-CM | POA: Diagnosis not present

## 2015-05-11 DIAGNOSIS — H26491 Other secondary cataract, right eye: Secondary | ICD-10-CM | POA: Diagnosis not present

## 2015-05-11 DIAGNOSIS — H40013 Open angle with borderline findings, low risk, bilateral: Secondary | ICD-10-CM | POA: Diagnosis not present

## 2015-05-20 DIAGNOSIS — Z09 Encounter for follow-up examination after completed treatment for conditions other than malignant neoplasm: Secondary | ICD-10-CM | POA: Diagnosis not present

## 2015-06-15 DIAGNOSIS — Z9841 Cataract extraction status, right eye: Secondary | ICD-10-CM | POA: Diagnosis not present

## 2015-06-15 DIAGNOSIS — H02403 Unspecified ptosis of bilateral eyelids: Secondary | ICD-10-CM | POA: Diagnosis not present

## 2015-06-15 DIAGNOSIS — Z888 Allergy status to other drugs, medicaments and biological substances status: Secondary | ICD-10-CM | POA: Diagnosis not present

## 2015-06-15 DIAGNOSIS — Z8673 Personal history of transient ischemic attack (TIA), and cerebral infarction without residual deficits: Secondary | ICD-10-CM | POA: Diagnosis not present

## 2015-06-15 DIAGNOSIS — H02834 Dermatochalasis of left upper eyelid: Secondary | ICD-10-CM | POA: Diagnosis not present

## 2015-06-15 DIAGNOSIS — Z9842 Cataract extraction status, left eye: Secondary | ICD-10-CM | POA: Diagnosis not present

## 2015-06-15 DIAGNOSIS — L908 Other atrophic disorders of skin: Secondary | ICD-10-CM | POA: Diagnosis not present

## 2015-06-15 DIAGNOSIS — H02831 Dermatochalasis of right upper eyelid: Secondary | ICD-10-CM | POA: Diagnosis not present

## 2015-06-15 DIAGNOSIS — I2510 Atherosclerotic heart disease of native coronary artery without angina pectoris: Secondary | ICD-10-CM | POA: Diagnosis not present

## 2015-06-15 DIAGNOSIS — Z79899 Other long term (current) drug therapy: Secondary | ICD-10-CM | POA: Diagnosis not present

## 2015-06-15 DIAGNOSIS — Z7902 Long term (current) use of antithrombotics/antiplatelets: Secondary | ICD-10-CM | POA: Diagnosis not present

## 2015-06-15 DIAGNOSIS — Z961 Presence of intraocular lens: Secondary | ICD-10-CM | POA: Diagnosis not present

## 2015-06-23 DIAGNOSIS — R5383 Other fatigue: Secondary | ICD-10-CM | POA: Diagnosis not present

## 2015-06-23 DIAGNOSIS — I10 Essential (primary) hypertension: Secondary | ICD-10-CM | POA: Diagnosis not present

## 2015-06-23 DIAGNOSIS — G2581 Restless legs syndrome: Secondary | ICD-10-CM | POA: Diagnosis not present

## 2015-08-26 ENCOUNTER — Encounter: Payer: Self-pay | Admitting: Cardiovascular Disease

## 2015-08-26 ENCOUNTER — Ambulatory Visit (INDEPENDENT_AMBULATORY_CARE_PROVIDER_SITE_OTHER): Payer: Medicare Other | Admitting: Cardiovascular Disease

## 2015-08-26 VITALS — BP 128/68 | HR 53 | Ht 59.75 in | Wt 127.0 lb

## 2015-08-26 DIAGNOSIS — G2581 Restless legs syndrome: Secondary | ICD-10-CM

## 2015-08-26 DIAGNOSIS — I10 Essential (primary) hypertension: Secondary | ICD-10-CM | POA: Diagnosis not present

## 2015-08-26 DIAGNOSIS — E785 Hyperlipidemia, unspecified: Secondary | ICD-10-CM

## 2015-08-26 DIAGNOSIS — R001 Bradycardia, unspecified: Secondary | ICD-10-CM | POA: Diagnosis not present

## 2015-08-26 MED ORDER — ATORVASTATIN CALCIUM 10 MG PO TABS: 5.0000 mg | ORAL_TABLET | Freq: Every day | ORAL | Status: DC

## 2015-08-26 MED ORDER — METOPROLOL SUCCINATE ER 25 MG PO TB24: 12.5000 mg | ORAL_TABLET | Freq: Every day | ORAL | Status: DC

## 2015-08-26 NOTE — Progress Notes (Signed)
Patient ID: Valerie Barker, female   DOB: 04/05/1922, 82 y.o.   MRN: [id number]     HPI: Valerie Barker is a 82 y.o. female who presents to the office for a 6 month cardiology followup evaluation.  Valerie Barker is the wife of my former patient Dr. Buckner Malta who died on 03/19/15.  Remotely, Valerie Barker suffered a TIA and has documented old asymptomatic lacunar infarcts. Prior to undergoing urologic surgery at [facility] in 2012 a nuclear perfusion study showed normal perfusion. An echo Doppler study demonstrated mild to moderate TR, trace MR, and mild aortic sclerosis without stenosis. She had normal systolic function. Carotid studies done in 2011 were normal.  She was  hospitalized for 2 nights after experiencing an TIA on 01/08/2013. Her symptoms resolved spontaneously. A CT of her head showed mild atrophy with extensive supratentorial small vessel disease without intracranial mass, hemorrhage or acute appearing infarction. An MRI of her brain did not show any acute intracranial findings but did show advanced atrophy with chronic microvascular ischemic changes. MRA did not reveal any intracranial flow reducing lesions. During that hospitalization a f/u echo Doppler study  on 01/29/2013 showed an ejection fraction at 65%. There was mild aortic sclerosis without stenosis. Carotid Doppler study showed minimal plaque with less than 39% reduction. She  was sent home on atorvastatin 10 mg Plavix 75 mg in addition to her valsartan HCT 320/12.5 and zolpidem.  IShe has had issues with swelling of legs and feet.  She also has continued issues with restless legs.  She has been taking requip 0.5 mg shortly before going to bed, but it takes a while for the medication to work and usually does not completely suffice.  At times she has taken a half a pill if she is to have a daytime nap.  When I last saw her, she was complaining of experiencing a "bubble" sensation.  She underwent carotid duplex imaging which was  normal in November 2016.  She denies any episodes of chest pain.  She is unaware of palpitations.  She admits to being fatigued , but actually feels improved from previously.  At times if she is on her back for certain duration.  She does note transient right arm paresthesias.  She denies associated chest pressure.  She presents for evaluation.  She recently has been undergoing extensive dental work with her dentist, Chief Financial Officer, and endodontist.  She also tells me she may need eyelid lift surgery which is to be done under local anesthesia [facility].  Past Medical History  Diagnosis Date  . Hypertension   . TIA (transient ischemic attack)   . IBS (irritable bowel syndrome)   . Stroke [facility])     Past Surgical History  Procedure Laterality Date  . Bunionectomy  2006, 1996  . Laparoscopic salpingo oopherectomy  1988  . Abdominal hysterectomy  1978    Allergies  Allergen Reactions  . Duloxetine Other (See Comments)    unknown  . Erythromycin   . Mirtazapine     Other reaction(s): Other (See Comments) unknown  . Tramadol     Other reaction(s): Other (See Comments) unknown  . Trazodone     Other reaction(s): Other (See Comments) unknown  . Zolpidem     Other reaction(s): Other (See Comments) unknown  . Cyclobenzaprine Anxiety    Unknown  . Diazepam Rash    unknown    Current Outpatient Prescriptions  Medication Sig Dispense Refill  . atorvastatin (LIPITOR) 10 MG  tablet Take 0.5 tablets (5 mg total) by mouth daily. 45 tablet 1  . Biotin 5000 MCG CAPS Take 1 capsule by mouth daily.    . Calcium Carb-Cholecalciferol (CALCIUM 600 + D) 600-200 MG-UNIT TABS Take 1 tablet by mouth 2 (two) times daily.    . Cholecalciferol (VITAMIN D) 2000 UNITS CAPS Take 1 capsule by mouth daily.    . clopidogrel (PLAVIX) 75 MG tablet Take 75 mg by mouth daily.    Marland Kitchen gabapentin (NEURONTIN) 100 MG capsule Take 1 capsule by mouth daily.    Marland Kitchen guaiFENesin (MUCINEX) 600 MG 12 hr tablet Take  1,200 mg by mouth daily as needed for congestion.    . metoprolol succinate (TOPROL-XL) 25 MG 24 hr tablet Take 0.5 tablets (12.5 mg total) by mouth at bedtime. 30 tablet 2  . Multiple Vitamin (MULTIVITAMIN) capsule Take 1 capsule by mouth daily.    Marland Kitchen MYRBETRIQ 25 MG TB24 tablet Take 1 tablet by mouth daily.     Marland Kitchen rOPINIRole (REQUIP) 0.5 MG tablet Take 1.5 tablets by mouth at bedtime. 3 hours before bed    . valsartan-hydrochlorothiazide (DIOVAN-HCT) 160-25 MG per tablet Take 1 tablet by mouth daily.     No current facility-administered medications for this visit.    Social History   Social History  . Marital Status: Married    Spouse Name: N/A  . Number of Children: N/A  . Years of Education: N/A   Occupational History  . Not on file.   Social History Main Topics  . Smoking status: Never Smoker   . Smokeless tobacco: Never Used  . Alcohol Use: No  . Drug Use: No  . Sexual Activity: Not on file   Other Topics Concern  . Not on file   Social History Narrative   Socially she is widowed as of December 2016.  Her husband was a former Scientist, physiological of education that [employer] and developed progressive dementia Prior to his death  Family History  Problem Relation Age of Onset  . COPD Mother   . Schizophrenia Brother   . Stroke Father    ROS General: Negative; No fevers, chills, or night sweats;  HEENT: Negative; No changes in vision or hearing, sinus congestion, difficulty swallowing Pulmonary: Negative; No cough, wheezing, shortness of breath, hemoptysis Cardiovascular: Negative; No chest pain, presyncope, syncope, palpitations Positive for leg swelling  GI: Negative; No nausea, vomiting, diarrhea, or abdominal pain GU: Positive for recurrent urinary incontinence despite her prior urologic surgery; No dysuria, hematuria, or difficulty voiding Musculoskeletal: Negative; no myalgias, joint pain, or weakness Hematologic/Oncology: Negative; no easy bruising, bleeding Endocrine:  Negative; no heat/cold intolerance; no diabetes Neuro: Positive for decreased short-term memory and history of TIA; no changes in balance, headaches Skin: Negative; No rashes or skin lesions Psychiatric: Negative; No behavioral problems, depression Sleep: Positive for restless leg syndrome; No snoring, daytime sleepiness, hypersomnolence, bruxism, hypnogognic hallucinations, no cataplexy Other comprehensive 14 point system review is negative.   PE BP 128/68 mmHg  Pulse 53  Ht 4' 11.75" (1.518 m)  Wt 127 lb (57.607 kg)  BMI 25.00 kg/m2   Wt Readings from Last 3 Encounters:  08/26/15 127 lb (57.607 kg)  02/06/15 133 lb 12.8 oz (60.691 kg)  08/04/14 141 lb (63.957 kg)    General: Alert, oriented, no distress.  Skin: normal turgor, no rashes HEENT: Normocephalic, atraumatic. Pupils round and reactive; sclera anicteric;no lid lag.  Nose without nasal septal hypertrophy Mouth/Parynx benign; Mallinpatti scale 2 Neck: No JVD, no  carotid bruits; normal carotid upstroke Lungs: clear to ausculatation and percussion; no wheezing or rales Chest wall: Nontender to palpation Heart: RRR, s1 s2 normal 1/6 systolic murmur; no diastolic murmur.  No rubs thrills or heaves.  No ectopy. Abdomen: soft, nontender; no hepatosplenomehaly, BS+; abdominal aorta nontender and not dilated by palpation. Back: No CVA tenderness Pulses 2+ Extremities: Trace to 1+ pretibial and ankle swelling.  no clubbing cyanosis , Homan's sign negative  Neurologic: grossly nonfocal Psychologic: Appropriately somber with her husband's  Death.  ECG (independently read by me): Sinus bradycardia 53 bpm.  No ectopy.  Normal intervals.  No ST segment changes.  November 2016 ECG (independently read by me): Sinus bradycardia 58 bpm.  Normal intervals.    October 2015 ECG (and apparently read by me) : Sinus rhythm with frequent PACs and transient atrial bigeminal pattern  Prior November 2014 ECG: Sinus rhythm with occasional  PACs. Intervals are normal. Nonspecific T changes.  LABS: BMP Latest Ref Rng 01/28/2013 01/28/2013 04/29/2009  Glucose 70 - 99 mg/dL - 118(H) 96  BUN 6 - 23 mg/dL - 17 17  Creatinine 0.50 - 1.10 mg/dL 0.87 0.95 0.91  Sodium 135 - 145 mEq/L - 139 143  Potassium 3.5 - 5.1 mEq/L - 4.1 4.0  Chloride 96 - 112 mEq/L - 102 109  CO2 19 - 32 mEq/L - 26 29  Calcium 8.4 - 10.5 mg/dL - 10.0 8.6   Hepatic Function Latest Ref Rng 01/28/2013 04/28/2009  Total Protein 6.0 - 8.3 g/dL 7.4 5.8(L)  Albumin 3.5 - 5.2 g/dL 4.3 3.3(L)  AST 0 - 37 U/L 22 15  ALT 0 - 35 U/L 20 16  Alk Phosphatase 39 - 117 U/L 73 46  Total Bilirubin 0.3 - 1.2 mg/dL 0.4 0.4  Bilirubin, Direct 0.0 - 0.3 mg/dL - 0.2   CBC Latest Ref Rng 01/28/2013 01/28/2013 04/28/2009  WBC 4.0 - 10.5 K/uL 5.8 6.5 6.3  Hemoglobin 12.0 - 15.0 g/dL 13.4 14.0 12.1  Hematocrit 36.0 - 46.0 % 39.1 41.9 35.5(L)  Platelets 150 - 400 K/uL 170 202 161   No results found for: TSH   Lipid Panel     Component Value Date/Time   CHOL 118 01/29/2013 0520   TRIG 66 01/29/2013 0520   HDL 66 01/29/2013 0520   CHOLHDL 1.8 01/29/2013 0520   VLDL 13 01/29/2013 0520   LDLCALC 39 01/29/2013 0520     RADIOLOGY: Ct Head (brain) Wo Contrast  01/28/2013   CLINICAL DATA:  Right upper extremity weakness and numbness  EXAM: CT HEAD WITHOUT CONTRAST  TECHNIQUE: Contiguous axial images were obtained from the base of the skull through the vertex without intravenous contrast. Study was obtained within 24 hr of patient's arrival at the emergency department.  COMPARISON:  Brain MRI April 27, 2009 and brain CT April 27, 2009  FINDINGS: There is mild diffuse atrophy. There is no mass, hemorrhage, extra-axial fluid collection, or midline shift. There is extensive small vessel disease throughout the centra semiovale bilaterally, a stable finding. There is no new gray-white compartment lesion. There is no demonstrable acute infarct. Bony calvarium appears intact. The  mastoid air cells are clear.  IMPRESSION: Mild atrophy with extensive supratentorial small vessel disease. No intracranial mass, hemorrhage, or acute appearing infarct.   Electronically Signed   By: Lowella Grip M.D.   On: 01/28/2013 14:42   Mr Brain Wo Contrast  01/28/2013   CLINICAL DATA:  Numbness in the arms. Dry hacking cough. History of hypertension  and dyslipidemia.  EXAM: MRI HEAD WITHOUT CONTRAST  MRA HEAD WITHOUT CONTRAST  TECHNIQUE: Multiplanar, multiecho pulse sequences of the brain and surrounding structures were obtained without intravenous contrast. Angiographic images of the head were obtained using MRA technique without contrast.  COMPARISON:  CT 01/28/2013.  FINDINGS: MRI HEAD FINDINGS  The patient was unable to remain motionless for the exam. Small or subtle lesions could be overlooked.  No evidence for acute infarction, hemorrhage, mass lesion, hydrocephalus, or extra-axial fluid. Moderate age-related atrophy. Extensive chronic microvascular ischemic change. Basal ganglia mineralization but no foci of chronic hemorrhage. Flow voids are maintained. No osseous findings. No remote large vessel infarct. Bilateral cataract extraction. No acute sinus or mastoid fluid. Good general agreement with prior CT.  MRA HEAD FINDINGS  Grossly patent internal carotid arteries, and basilar artery. Vertebrals are codominant. No flow-limiting intracranial stenosis, branch occlusion, or aneurysm is evident on this motion degraded exam.  IMPRESSION: MRI HEAD IMPRESSION  No acute intracranial findings are evident. There is advanced atrophy with chronic microvascular ischemic change.  MRA HEAD IMPRESSION  No intracranial flow reducing lesion is evident.   Electronically Signed   By: Rolla Flatten M.D.   On: 01/28/2013 18:47   Mr Jodene Nam Head/brain Wo Cm  01/28/2013   CLINICAL DATA:  Numbness in the arms. Dry hacking cough. History of hypertension and dyslipidemia.  EXAM: MRI HEAD WITHOUT CONTRAST  MRA HEAD  WITHOUT CONTRAST  TECHNIQUE: Multiplanar, multiecho pulse sequences of the brain and surrounding structures were obtained without intravenous contrast. Angiographic images of the head were obtained using MRA technique without contrast.  COMPARISON:  CT 01/28/2013.  FINDINGS: MRI HEAD FINDINGS  The patient was unable to remain motionless for the exam. Small or subtle lesions could be overlooked.  No evidence for acute infarction, hemorrhage, mass lesion, hydrocephalus, or extra-axial fluid. Moderate age-related atrophy. Extensive chronic microvascular ischemic change. Basal ganglia mineralization but no foci of chronic hemorrhage. Flow voids are maintained. No osseous findings. No remote large vessel infarct. Bilateral cataract extraction. No acute sinus or mastoid fluid. Good general agreement with prior CT.  MRA HEAD FINDINGS  Grossly patent internal carotid arteries, and basilar artery. Vertebrals are codominant. No flow-limiting intracranial stenosis, branch occlusion, or aneurysm is evident on this motion degraded exam.  IMPRESSION: MRI HEAD IMPRESSION  No acute intracranial findings are evident. There is advanced atrophy with chronic microvascular ischemic change.  MRA HEAD IMPRESSION  No intracranial flow reducing lesion is evident.   Electronically Signed   By: Rolla Flatten M.D.   On: 01/28/2013 18:47      ASSESSMENT AND PLAN: Ms. Gudgel is a 82 years old Caucasian female with history of prior TIA's and remotely had been  diagnosed as having a TIA with old asymptomatic lacunar infarct. On CT, MRI and MRA studies there is  significant small vessel disease of her brain.  She does have normal systolic function and evidence for aortic sclerosis on echo assessment. She is on Plavix with her TIA history.  Presently, her blood pressure is well controlled on valsartan HCT 160/25 mg in addition to her Toprol-XL 12.5 mg daily.  She no longer is having significant edema.  With the HCTZ component to her ARB  therapy.  She does admit to fatigue.  I have suggested she change taking her Toprol to the evening rather than in the morning.  She continues to take Plavix for her history of TIA.  She is on atorvastatin for hyperlipidemia and is tolerating this well  without myalgias.  I suspect some of her arm paresthesias may be positionally related to her sleep and she may very well have degenerative joint disease of her spine.  I reassured her that I did not believe this was cardiac.  With reference to her restless legs.  I have suggested she try increasing her Requip to 0.75 mg and to take this 3 hours before going to bed.  If she is to undergo eyelid surgery, she was advised to hold her Plavix for 5 days prior to the procedure.  I will see her in 6 months for cardiology reevaluation.  Time spent: 25 minutes  Troy Sine, MD, [facility]  08/26/2015 1:24 PM

## 2015-08-26 NOTE — Patient Instructions (Addendum)
Your physician wants you to follow-up in: 6 months or sooner if needed. You will receive a reminder letter in the mail two months in advance. If you don't receive a letter, please call our office to schedule the follow-up appointment.  Your physician has recommended you make the following change in your medication:   1.) the Requip has been increased to 1.5 tablets 3 hours before bed time.  2.) Dr Claiborne Billings recommends that you change your metoprolol dose to night time.

## 2015-09-30 DIAGNOSIS — R5383 Other fatigue: Secondary | ICD-10-CM | POA: Diagnosis not present

## 2015-09-30 DIAGNOSIS — K5900 Constipation, unspecified: Secondary | ICD-10-CM | POA: Diagnosis not present

## 2015-10-05 ENCOUNTER — Telehealth: Payer: Self-pay | Admitting: Cardiovascular Disease

## 2015-10-05 NOTE — Telephone Encounter (Signed)
Returned call and spoke w son of patient initially.  He notes Thursday and this AM, patient had felt "really bad", "weak", blood pressure was high when checked. Patient a resident at [facility] on [address], facility staff checked her out Thursday evening and called EMT.  Patient saw a PA at her primary care office on Friday AM, who ordered labwork, tests, noted results all OK.  Patient noted over the weekend she had a "rumbling noise" periodically in her head.  Today EMTs again were called due to pt symptoms-- when BP checked, was 188/92. By the time they left and did a final check, BP was 146/78.  Spoke w/ patient. Pt notes at her visit on 08/26/15 w Dr. Claiborne Billings, med dosing was not changed, but the administration times were. She is wondering if this might contribute to these episodes. She also states the "rumbling" noise is very bothersome to her.  Patient aware I will seek advice - routed to [employer] and [facility]D for recommendations.

## 2015-10-05 NOTE — Telephone Encounter (Signed)
Returned call. Son aware I will follow up on this Wednesday when schedulers back so that we can get extender availability. He is amenable to this plan.

## 2015-10-05 NOTE — Telephone Encounter (Signed)
Labile hypertension can be caused by many things - consider also anxiety. Will need to be further evaluated if she is that symptomatic. Would have her follow-up with Dr. Claiborne Billings (or extender, since he is out of town this week).  Dr. Lemmie Evens

## 2015-10-05 NOTE — Telephone Encounter (Signed)
Pt have been having high blood pressure issues for the last few weeks. EMT was called in twice including this morning. They advised that she see her Cardiologist today,pt will see whoever have an opening today. Her blood pressure this morning was 188/92 and did go down some Please call asap to advise.

## 2015-10-05 NOTE — Telephone Encounter (Signed)
Will defer to DOD

## 2015-10-05 NOTE — Telephone Encounter (Signed)
Mr.Harper is wanting to speak With Ovid Curd about his mother. Please call

## 2015-10-07 ENCOUNTER — Telehealth: Payer: Self-pay | Admitting: Cardiovascular Disease

## 2015-10-07 ENCOUNTER — Ambulatory Visit: Payer: Medicare Other | Admitting: Cardiology

## 2015-10-07 DIAGNOSIS — K5901 Slow transit constipation: Secondary | ICD-10-CM | POA: Diagnosis not present

## 2015-10-07 DIAGNOSIS — N3281 Overactive bladder: Secondary | ICD-10-CM | POA: Diagnosis not present

## 2015-10-07 NOTE — Telephone Encounter (Signed)
Patient had to cancel today's appt which she was added for same-day, due to a previous schedule conflict.  Will call tomorrow regarding this and getting her seen same day re: labile HTN.

## 2015-10-07 NOTE — Telephone Encounter (Signed)
Pt to see Cecilie Kicks today at 2pm, confirmed appt info w patient's son on phone. Scheduling assistance by Thomes Dinning.

## 2015-10-07 NOTE — Telephone Encounter (Signed)
Confirmed rescheduled appt w son of patient.

## 2015-10-07 NOTE — Telephone Encounter (Addendum)
Called to touch base. Have spoken w/ schedulers, currently today's schedule full, some same day slots avail later in week. Spoke to son and apprised him of this, he was very understanding and stated thanks for the call. Notes regarding his/patient's availability: Son states Thursday "wide open", Friday afternoon not good but if that is the only availability he will make adjustments as needed. Would need 2+ hrs notification before appt.

## 2015-10-07 NOTE — Telephone Encounter (Signed)
New Message  Per pt son conflict w/ 7/6 appt. Pt son wanted to inform Ovid Curd before rescheduling please call back and discuss.

## 2015-10-09 ENCOUNTER — Encounter (INDEPENDENT_AMBULATORY_CARE_PROVIDER_SITE_OTHER): Payer: Self-pay

## 2015-10-09 ENCOUNTER — Ambulatory Visit (INDEPENDENT_AMBULATORY_CARE_PROVIDER_SITE_OTHER): Payer: Medicare Other | Admitting: Physician Assistant

## 2015-10-09 ENCOUNTER — Encounter: Payer: Self-pay | Admitting: Physician Assistant

## 2015-10-09 VITALS — BP 128/56 | HR 58 | Ht 59.0 in | Wt 130.2 lb

## 2015-10-09 DIAGNOSIS — Z8673 Personal history of transient ischemic attack (TIA), and cerebral infarction without residual deficits: Secondary | ICD-10-CM | POA: Diagnosis not present

## 2015-10-09 DIAGNOSIS — R42 Dizziness and giddiness: Secondary | ICD-10-CM | POA: Diagnosis not present

## 2015-10-09 DIAGNOSIS — I10 Essential (primary) hypertension: Secondary | ICD-10-CM | POA: Diagnosis not present

## 2015-10-09 LAB — TSH: TSH: 2.19 mIU/L

## 2015-10-09 NOTE — Progress Notes (Signed)
Cardiology Office Note    Date:  10/09/2015   ID:  CATINA ZAGORSKI, DOB [birth year], MRN [id number]  PCP:  Horatio Pel, MD  Cardiologist:  Dr. Claiborne Billings   Chief Complaint  Patient presents with  . Follow-up    seen for Dr. Claiborne Billings    History of Present Illness:  THERESEA ALBANESE is a 82 y.o. female with PMH of HTN and TIA/CVA. She is currently a resident at [facility]. She had a nuclear perfusion study in 2012 prior to her surgery at [facility] that was normal. She was hospitalized for 2 nights after experiencing TIA on 01/08/2013, CT of the head showed extensive supratentorial small vessel disease without acute changes. MRI of the brain did not reveal any flow reducing lesions. Image does reveal old lacunar infarct. She was sent home on Lipitor and Plavix in addition to her valsartan. She takes Requip for restless leg syndrome. Last echocardiogram obtained on 01/21/2013 showed EF 65%, no RWMA. Carotid study on 02/13/2015 was also normal.   Based on the phone call made on 10/05/2015, patient has been having high blood pressure issues for the past several weeks. EMT was called twice including the morning of 7/3. He complained of a rumbling noise periodically in her head. He was recently seen by her PCPs office. The patient has been seen by her PCPs office on 09/22/2015, lab was drawn including CMP, CBC, urinalysis and was reportedly normal. We will request records from PCPs office. I will add a TSH to her lab today. She has body in a blood pressure diary with her, majority of the time her systolic blood pressures ranging 120 to 140s, I am hesitant to increase blood pressure this time. Her son also brought in the EMS EKG strip with them as well, I have reviewed the EKG strip, there is no significant ST-T wave changes, it was a normal EKG. We did not obtain EKG today. She also mentions she has occasional tingling sensation over the left elbow area, sometimes hypocalcemia can cause some of this  symptom, but she was reportedly having normal lab.   Otherwise I will continue on the current medication. I will continue to ask her to monitor the blood pressure twice a day with additional time as needed if symptomatic. Instead of keep calling EMS, I have instructed her to contact cardiology office or after hour answering service if needed if have significant blood pressure fluctuation. I think some of this may be anxiety, it seems majority of the time her blood pressure is very well and onlyspike occasionally. Despite blood pressure sometimes go up, they tend to return to normal by themselves without further intervention. She will follow-up with Dr. Claiborne Billings in 3 month, given her advanced age, I would not do any further workup at this time. Of note, sometimes she complaining of dizziness when turning her head, she was noted to have completely normal carotid ultrasound in November 2016, which is actually quite impressive given her age.    Past Medical History  Diagnosis Date  . Hypertension   . TIA (transient ischemic attack)   . IBS (irritable bowel syndrome)   . Stroke [facility])     Past Surgical History  Procedure Laterality Date  . Bunionectomy  2006, 1996  . Laparoscopic salpingo oopherectomy  1988  . Abdominal hysterectomy  1978    Current Medications: Outpatient Prescriptions Prior to Visit  Medication Sig Dispense Refill  . atorvastatin (LIPITOR) 10 MG tablet Take 0.5 tablets (5 mg total)  by mouth daily. 45 tablet 1  . Biotin 5000 MCG CAPS Take 1 capsule by mouth daily.    . Calcium Carb-Cholecalciferol (CALCIUM 600 + D) 600-200 MG-UNIT TABS Take 1 tablet by mouth 2 (two) times daily.    . Cholecalciferol (VITAMIN D) 2000 UNITS CAPS Take 1 capsule by mouth daily.    . clopidogrel (PLAVIX) 75 MG tablet Take 75 mg by mouth daily.    Marland Kitchen gabapentin (NEURONTIN) 100 MG capsule Take 1 capsule by mouth daily.    Marland Kitchen guaiFENesin (MUCINEX) 600 MG 12 hr tablet Take 1,200 mg by mouth daily as  needed for congestion.    . metoprolol succinate (TOPROL-XL) 25 MG 24 hr tablet Take 0.5 tablets (12.5 mg total) by mouth at bedtime. 30 tablet 2  . Multiple Vitamin (MULTIVITAMIN) capsule Take 1 capsule by mouth daily.    Marland Kitchen rOPINIRole (REQUIP) 0.5 MG tablet Take 1.5 tablets by mouth at bedtime. 3 hours before bed    . valsartan-hydrochlorothiazide (DIOVAN-HCT) 160-25 MG per tablet Take 1 tablet by mouth daily.    Marland Kitchen MYRBETRIQ 25 MG TB24 tablet Take 1 tablet by mouth daily. Reported on 10/09/2015     No facility-administered medications prior to visit.     Allergies:   Duloxetine; Erythromycin; Mirtazapine; Tramadol; Trazodone; Zolpidem; Cyclobenzaprine; and Diazepam   Social History   Social History  . Marital Status: Married    Spouse Name: N/A  . Number of Children: N/A  . Years of Education: N/A   Social History Main Topics  . Smoking status: Never Smoker   . Smokeless tobacco: Never Used  . Alcohol Use: No  . Drug Use: No  . Sexual Activity: Not Asked   Other Topics Concern  . None   Social History Narrative     Family History:  The patient's family history includes COPD in her mother; Schizophrenia in her brother; Stroke in her father.   ROS:   Please see the history of present illness.    ROS All other systems reviewed and are negative.   PHYSICAL EXAM:   VS:  BP 128/56 mmHg  Pulse 58  Ht 4\' 11"  (1.499 m)  Wt 130 lb 3.2 oz (59.058 kg)  BMI 26.28 kg/m2   GEN: Well nourished, well developed, in no acute distress HEENT: normal Neck: no JVD, carotid bruits, or masses Cardiac: RRR; no murmurs, rubs, or gallops,no edema  Respiratory:  clear to auscultation bilaterally, normal work of breathing GI: soft, nontender, nondistended, + BS MS: no deformity or atrophy Skin: warm and dry, no rash Neuro:  Alert and Oriented x 3, Strength and sensation are intact Psych: euthymic mood, full affect  Wt Readings from Last 3 Encounters:  10/09/15 130 lb 3.2 oz (59.058 kg)    08/26/15 127 lb (57.607 kg)  02/06/15 133 lb 12.8 oz (60.691 kg)      Studies/Labs Reviewed:   EKG:  EKG is not ordered today.   Recent Labs: 10/09/2015: TSH 2.19   Lipid Panel    Component Value Date/Time   CHOL 118 01/29/2013 0520   TRIG 66 01/29/2013 0520   HDL 66 01/29/2013 0520   CHOLHDL 1.8 01/29/2013 0520   VLDL 13 01/29/2013 0520   LDLCALC 39 01/29/2013 0520    Additional studies/ records that were reviewed today include:   CTA of chead 04/27/2009 IMPRESSION: Evidence of multiple old lacunar infarcts with chronic small vessel ischemic changes in both cerebral hemispheres.  No acute intracranial abnormality.   Echo 01/29/2013 LV  EF: 65%  ------------------------------------------------------------ Indications:   TIA 435.9.  ------------------------------------------------------------ History:  Risk factors: Hypertension. Dyslipidemia.  ------------------------------------------------------------ Study Conclusions  - Left ventricle: The cavity size was normal. Wall thickness was increased in a pattern of mild LVH. The estimated ejection fraction was 65%. Wall motion was normal; there were no regional wall motion abnormalities. - Aortic valve: Sclerosis without stenosis. No significant regurgitation. - Right ventricle: Not able to assess RV function due to poor visualization. Not able to assess RV size due to poor visualization. Impressions:  - No cardiac source of embolism was identified, but cannot be ruled out on the basis of this examination.  ASSESSMENT:    1. Essential hypertension   2. Dizziness   3. History of TIA (transient ischemic attack)      PLAN:  In order of problems listed above:  1. Dizziness/Hypertension  - Carotid U/S normal, despite occasional spike in her BP, her BP is actually very well normally. Spike in BP tend to return to normal without intervention. I think it is related to anxiety. I will not  increase her BP med at this time and i think drop her BP will cause increased chance of fall and risk outweigh the benefit.   - she has called EMS everytime her SBP goes up to > 180s, i have advised her to contact EMS only if she is having symptom, otherwise contact cardiology office for advise instead. I think she is doing remarkably well given her age, with some relaxation, her BP will come down by themselves, if not, she can always take additional dose of hydralazine   2. H/o TIA: one of previous CT of head mentions old lacunar infarct, although she denies any knowledge of previous stroke.     Medication Adjustments/Labs and Tests Ordered: Current medicines are reviewed at length with the patient today.  Concerns regarding medicines are outlined above.  Medication changes, Labs and Tests ordered today are listed in the Patient Instructions below. Patient Instructions  Medication Instructions:  Your physician recommends that you continue on your current medications as directed. Please refer to the Current Medication list given to you today.   Labwork: Your physician recommends that you have lab work today: tsh   Testing/Procedures: -None  Follow-Up: Your physician wants you to follow-up in: 3 months with Dr. Claiborne Billings.  You will receive a reminder letter in the mail two months in advance. If you don't receive a letter, please call our office to schedule the follow-up appointment.   Any Other Special Instructions Will Be Listed Below (If Applicable).  Dr. Pennie Banter office was called today to request recent lab results.  Will fax over Monday.  If you need a refill on your cardiac medications before your next appointment, please call your pharmacy.       Hilbert Corrigan, [place]  10/09/2015 11:19 PM    [facility] [address], [place], [place]  [address] Phone: [phone]; Fax: [phone]

## 2015-10-09 NOTE — Patient Instructions (Signed)
Medication Instructions:  Your physician recommends that you continue on your current medications as directed. Please refer to the Current Medication list given to you today.   Labwork: Your physician recommends that you have lab work today: tsh   Testing/Procedures: -None  Follow-Up: Your physician wants you to follow-up in: 3 months with Dr. Claiborne Billings.  You will receive a reminder letter in the mail two months in advance. If you don't receive a letter, please call our office to schedule the follow-up appointment.   Any Other Special Instructions Will Be Listed Below (If Applicable).  Dr. Pennie Banter office was called today to request recent lab results.  Will fax over Monday.  If you need a refill on your cardiac medications before your next appointment, please call your pharmacy.

## 2015-10-28 DIAGNOSIS — R5383 Other fatigue: Secondary | ICD-10-CM | POA: Diagnosis not present

## 2015-10-28 DIAGNOSIS — R8290 Unspecified abnormal findings in urine: Secondary | ICD-10-CM | POA: Diagnosis not present

## 2015-10-28 DIAGNOSIS — K5901 Slow transit constipation: Secondary | ICD-10-CM | POA: Diagnosis not present

## 2015-10-29 ENCOUNTER — Other Ambulatory Visit: Payer: Self-pay | Admitting: Internal Medicine

## 2015-10-29 DIAGNOSIS — Z1231 Encounter for screening mammogram for malignant neoplasm of breast: Secondary | ICD-10-CM

## 2015-11-02 DIAGNOSIS — R300 Dysuria: Secondary | ICD-10-CM | POA: Diagnosis not present

## 2015-11-06 DIAGNOSIS — R358 Other polyuria: Secondary | ICD-10-CM | POA: Diagnosis not present

## 2015-11-06 DIAGNOSIS — R300 Dysuria: Secondary | ICD-10-CM | POA: Diagnosis not present

## 2015-11-10 DIAGNOSIS — Z886 Allergy status to analgesic agent status: Secondary | ICD-10-CM | POA: Diagnosis not present

## 2015-11-10 DIAGNOSIS — Z888 Allergy status to other drugs, medicaments and biological substances status: Secondary | ICD-10-CM | POA: Diagnosis not present

## 2015-11-10 DIAGNOSIS — Z79899 Other long term (current) drug therapy: Secondary | ICD-10-CM | POA: Diagnosis not present

## 2015-11-10 DIAGNOSIS — I2510 Atherosclerotic heart disease of native coronary artery without angina pectoris: Secondary | ICD-10-CM | POA: Diagnosis not present

## 2015-11-10 DIAGNOSIS — H02403 Unspecified ptosis of bilateral eyelids: Secondary | ICD-10-CM | POA: Diagnosis not present

## 2015-11-10 DIAGNOSIS — H02834 Dermatochalasis of left upper eyelid: Secondary | ICD-10-CM | POA: Diagnosis not present

## 2015-11-10 DIAGNOSIS — Z885 Allergy status to narcotic agent status: Secondary | ICD-10-CM | POA: Diagnosis not present

## 2015-11-10 DIAGNOSIS — Z7901 Long term (current) use of anticoagulants: Secondary | ICD-10-CM | POA: Diagnosis not present

## 2015-11-10 DIAGNOSIS — Z961 Presence of intraocular lens: Secondary | ICD-10-CM | POA: Diagnosis not present

## 2015-11-10 DIAGNOSIS — Z7902 Long term (current) use of antithrombotics/antiplatelets: Secondary | ICD-10-CM | POA: Diagnosis not present

## 2015-11-10 DIAGNOSIS — L908 Other atrophic disorders of skin: Secondary | ICD-10-CM | POA: Diagnosis not present

## 2015-11-10 DIAGNOSIS — H02831 Dermatochalasis of right upper eyelid: Secondary | ICD-10-CM | POA: Diagnosis not present

## 2015-11-10 DIAGNOSIS — Z8673 Personal history of transient ischemic attack (TIA), and cerebral infarction without residual deficits: Secondary | ICD-10-CM | POA: Diagnosis not present

## 2015-11-10 DIAGNOSIS — Z882 Allergy status to sulfonamides status: Secondary | ICD-10-CM | POA: Diagnosis not present

## 2015-11-11 ENCOUNTER — Telehealth: Payer: Self-pay | Admitting: Cardiovascular Disease

## 2015-11-11 DIAGNOSIS — I10 Essential (primary) hypertension: Secondary | ICD-10-CM | POA: Diagnosis not present

## 2015-11-11 DIAGNOSIS — R5382 Chronic fatigue, unspecified: Secondary | ICD-10-CM | POA: Diagnosis not present

## 2015-11-11 DIAGNOSIS — K5900 Constipation, unspecified: Secondary | ICD-10-CM | POA: Diagnosis not present

## 2015-11-11 NOTE — Telephone Encounter (Signed)
Surgical Clearance routed to MD Claiborne Billings.

## 2015-11-11 NOTE — Telephone Encounter (Signed)
Request for surgical clearance:  1. What type of surgery is being performed? Blepharoplasty  And levator rescection   2. When is this surgery scheduled? 11/18/15  3. Are there any medications that need to be held prior to surgery and how long?Plavix 5 days before   4. Name of physician performing surgery? Dr. Ellwood Sayers  5. What is your office phone and fax number? [phone]  [phone]

## 2015-11-12 NOTE — Telephone Encounter (Signed)
Follow Up  Valerie Barker from [facility] is calling about plavix which needs to be held.  [name] verbalized the form was sitting on Dr. Ermalinda Memos desk waiting for it to be faxed.  Phone# [phone] is the direct Fax is [phone].  Please Follow up it's urgent, pt is having surgery tomorrow.

## 2015-11-12 NOTE — Telephone Encounter (Signed)
Information was received by our office yesterday. Will give Dr. Claiborne Billings time to review this.

## 2015-11-13 DIAGNOSIS — G459 Transient cerebral ischemic attack, unspecified: Secondary | ICD-10-CM | POA: Diagnosis not present

## 2015-11-19 NOTE — Telephone Encounter (Signed)
At her last ov she was advised to hold plavix for 5 days prior to her surgery

## 2015-11-19 NOTE — Telephone Encounter (Signed)
Clearance routed via EPIC 

## 2015-12-10 ENCOUNTER — Ambulatory Visit
Admission: RE | Admit: 2015-12-10 | Discharge: 2015-12-10 | Disposition: A | Discharge status: Home / Self Care | Payer: Medicare Other | Source: Ambulatory Visit | Attending: Internal Medicine | Admitting: Internal Medicine

## 2015-12-10 DIAGNOSIS — Z1231 Encounter for screening mammogram for malignant neoplasm of breast: Secondary | ICD-10-CM | POA: Diagnosis not present

## 2015-12-16 DIAGNOSIS — R413 Other amnesia: Secondary | ICD-10-CM | POA: Diagnosis present

## 2015-12-16 DIAGNOSIS — G459 Transient cerebral ischemic attack, unspecified: Secondary | ICD-10-CM | POA: Diagnosis not present

## 2015-12-16 DIAGNOSIS — G5600 Carpal tunnel syndrome, unspecified upper limb: Secondary | ICD-10-CM | POA: Diagnosis not present

## 2015-12-17 DIAGNOSIS — I10 Essential (primary) hypertension: Secondary | ICD-10-CM | POA: Diagnosis not present

## 2015-12-17 DIAGNOSIS — K5900 Constipation, unspecified: Secondary | ICD-10-CM | POA: Diagnosis not present

## 2015-12-17 DIAGNOSIS — R202 Paresthesia of skin: Secondary | ICD-10-CM | POA: Diagnosis not present

## 2015-12-17 DIAGNOSIS — Z23 Encounter for immunization: Secondary | ICD-10-CM | POA: Diagnosis not present

## 2016-01-08 DIAGNOSIS — G2581 Restless legs syndrome: Secondary | ICD-10-CM | POA: Diagnosis not present

## 2016-01-20 ENCOUNTER — Other Ambulatory Visit: Payer: Self-pay | Admitting: Cardiovascular Disease

## 2016-01-21 NOTE — Telephone Encounter (Signed)
REFILL 

## 2016-02-15 DIAGNOSIS — I2510 Atherosclerotic heart disease of native coronary artery without angina pectoris: Secondary | ICD-10-CM | POA: Diagnosis not present

## 2016-02-15 DIAGNOSIS — R001 Bradycardia, unspecified: Secondary | ICD-10-CM | POA: Diagnosis not present

## 2016-02-15 DIAGNOSIS — H02831 Dermatochalasis of right upper eyelid: Secondary | ICD-10-CM | POA: Diagnosis not present

## 2016-02-15 DIAGNOSIS — Z8673 Personal history of transient ischemic attack (TIA), and cerebral infarction without residual deficits: Secondary | ICD-10-CM | POA: Diagnosis not present

## 2016-02-15 DIAGNOSIS — Z882 Allergy status to sulfonamides status: Secondary | ICD-10-CM | POA: Diagnosis not present

## 2016-02-15 DIAGNOSIS — H02834 Dermatochalasis of left upper eyelid: Secondary | ICD-10-CM | POA: Diagnosis not present

## 2016-02-15 DIAGNOSIS — Z9889 Other specified postprocedural states: Secondary | ICD-10-CM | POA: Diagnosis not present

## 2016-02-15 DIAGNOSIS — Z961 Presence of intraocular lens: Secondary | ICD-10-CM | POA: Diagnosis not present

## 2016-02-15 DIAGNOSIS — H02423 Myogenic ptosis of bilateral eyelids: Secondary | ICD-10-CM | POA: Diagnosis not present

## 2016-02-15 DIAGNOSIS — Z888 Allergy status to other drugs, medicaments and biological substances status: Secondary | ICD-10-CM | POA: Diagnosis not present

## 2016-02-15 DIAGNOSIS — Z9842 Cataract extraction status, left eye: Secondary | ICD-10-CM | POA: Diagnosis not present

## 2016-02-15 DIAGNOSIS — Z9841 Cataract extraction status, right eye: Secondary | ICD-10-CM | POA: Diagnosis not present

## 2016-02-15 DIAGNOSIS — L908 Other atrophic disorders of skin: Secondary | ICD-10-CM | POA: Diagnosis not present

## 2016-02-15 DIAGNOSIS — I10 Essential (primary) hypertension: Secondary | ICD-10-CM | POA: Diagnosis not present

## 2016-02-15 DIAGNOSIS — Z79899 Other long term (current) drug therapy: Secondary | ICD-10-CM | POA: Diagnosis not present

## 2016-02-15 DIAGNOSIS — Z885 Allergy status to narcotic agent status: Secondary | ICD-10-CM | POA: Diagnosis not present

## 2016-02-18 DIAGNOSIS — Z7901 Long term (current) use of anticoagulants: Secondary | ICD-10-CM | POA: Diagnosis not present

## 2016-02-18 DIAGNOSIS — Z01818 Encounter for other preprocedural examination: Secondary | ICD-10-CM | POA: Diagnosis not present

## 2016-02-19 ENCOUNTER — Other Ambulatory Visit: Payer: Self-pay | Admitting: Cardiovascular Disease

## 2016-02-19 NOTE — Telephone Encounter (Signed)
Rx request sent to pharmacy.  

## 2016-03-04 DIAGNOSIS — H02831 Dermatochalasis of right upper eyelid: Secondary | ICD-10-CM | POA: Diagnosis not present

## 2016-03-04 DIAGNOSIS — E785 Hyperlipidemia, unspecified: Secondary | ICD-10-CM | POA: Diagnosis not present

## 2016-03-04 DIAGNOSIS — Z7902 Long term (current) use of antithrombotics/antiplatelets: Secondary | ICD-10-CM | POA: Diagnosis not present

## 2016-03-04 DIAGNOSIS — G2581 Restless legs syndrome: Secondary | ICD-10-CM | POA: Diagnosis not present

## 2016-03-04 DIAGNOSIS — Z886 Allergy status to analgesic agent status: Secondary | ICD-10-CM | POA: Diagnosis not present

## 2016-03-04 DIAGNOSIS — Z881 Allergy status to other antibiotic agents status: Secondary | ICD-10-CM | POA: Diagnosis not present

## 2016-03-04 DIAGNOSIS — H02834 Dermatochalasis of left upper eyelid: Secondary | ICD-10-CM | POA: Diagnosis not present

## 2016-03-04 DIAGNOSIS — I2510 Atherosclerotic heart disease of native coronary artery without angina pectoris: Secondary | ICD-10-CM | POA: Diagnosis not present

## 2016-03-04 DIAGNOSIS — Z888 Allergy status to other drugs, medicaments and biological substances status: Secondary | ICD-10-CM | POA: Diagnosis not present

## 2016-03-04 DIAGNOSIS — H0234 Blepharochalasis left upper eyelid: Secondary | ICD-10-CM | POA: Diagnosis not present

## 2016-03-04 DIAGNOSIS — Z79899 Other long term (current) drug therapy: Secondary | ICD-10-CM | POA: Diagnosis not present

## 2016-03-04 DIAGNOSIS — Z882 Allergy status to sulfonamides status: Secondary | ICD-10-CM | POA: Diagnosis not present

## 2016-03-04 DIAGNOSIS — H0231 Blepharochalasis right upper eyelid: Secondary | ICD-10-CM | POA: Diagnosis not present

## 2016-03-04 DIAGNOSIS — Z885 Allergy status to narcotic agent status: Secondary | ICD-10-CM | POA: Diagnosis not present

## 2016-03-04 DIAGNOSIS — I10 Essential (primary) hypertension: Secondary | ICD-10-CM | POA: Diagnosis not present

## 2016-03-04 DIAGNOSIS — Z8673 Personal history of transient ischemic attack (TIA), and cerebral infarction without residual deficits: Secondary | ICD-10-CM | POA: Diagnosis not present

## 2016-03-04 DIAGNOSIS — H02423 Myogenic ptosis of bilateral eyelids: Secondary | ICD-10-CM | POA: Diagnosis not present

## 2016-03-10 DIAGNOSIS — Z8673 Personal history of transient ischemic attack (TIA), and cerebral infarction without residual deficits: Secondary | ICD-10-CM | POA: Diagnosis not present

## 2016-03-10 DIAGNOSIS — S0012XA Contusion of left eyelid and periocular area, initial encounter: Secondary | ICD-10-CM | POA: Diagnosis not present

## 2016-03-10 DIAGNOSIS — Z9842 Cataract extraction status, left eye: Secondary | ICD-10-CM | POA: Diagnosis not present

## 2016-03-10 DIAGNOSIS — S0011XA Contusion of right eyelid and periocular area, initial encounter: Secondary | ICD-10-CM | POA: Diagnosis not present

## 2016-03-10 DIAGNOSIS — Z48810 Encounter for surgical aftercare following surgery on the sense organs: Secondary | ICD-10-CM | POA: Diagnosis not present

## 2016-03-10 DIAGNOSIS — Z9841 Cataract extraction status, right eye: Secondary | ICD-10-CM | POA: Diagnosis not present

## 2016-03-10 DIAGNOSIS — I2510 Atherosclerotic heart disease of native coronary artery without angina pectoris: Secondary | ICD-10-CM | POA: Diagnosis not present

## 2016-03-10 DIAGNOSIS — Z882 Allergy status to sulfonamides status: Secondary | ICD-10-CM | POA: Diagnosis not present

## 2016-03-10 DIAGNOSIS — Z886 Allergy status to analgesic agent status: Secondary | ICD-10-CM | POA: Diagnosis not present

## 2016-03-10 DIAGNOSIS — Z79899 Other long term (current) drug therapy: Secondary | ICD-10-CM | POA: Diagnosis not present

## 2016-03-10 DIAGNOSIS — Z888 Allergy status to other drugs, medicaments and biological substances status: Secondary | ICD-10-CM | POA: Diagnosis not present

## 2016-03-17 DIAGNOSIS — I10 Essential (primary) hypertension: Secondary | ICD-10-CM | POA: Diagnosis not present

## 2016-03-17 DIAGNOSIS — M810 Age-related osteoporosis without current pathological fracture: Secondary | ICD-10-CM | POA: Diagnosis not present

## 2016-03-17 DIAGNOSIS — E559 Vitamin D deficiency, unspecified: Secondary | ICD-10-CM | POA: Diagnosis not present

## 2016-03-17 DIAGNOSIS — Z0000 Encounter for general adult medical examination without abnormal findings: Secondary | ICD-10-CM | POA: Diagnosis not present

## 2016-03-24 DIAGNOSIS — E538 Deficiency of other specified B group vitamins: Secondary | ICD-10-CM | POA: Diagnosis not present

## 2016-03-24 DIAGNOSIS — Z8673 Personal history of transient ischemic attack (TIA), and cerebral infarction without residual deficits: Secondary | ICD-10-CM | POA: Diagnosis not present

## 2016-03-24 DIAGNOSIS — I10 Essential (primary) hypertension: Secondary | ICD-10-CM | POA: Diagnosis not present

## 2016-03-24 DIAGNOSIS — Z1212 Encounter for screening for malignant neoplasm of rectum: Secondary | ICD-10-CM | POA: Diagnosis not present

## 2016-03-24 DIAGNOSIS — Z0000 Encounter for general adult medical examination without abnormal findings: Secondary | ICD-10-CM | POA: Diagnosis not present

## 2016-04-14 ENCOUNTER — Other Ambulatory Visit: Payer: Self-pay | Admitting: Cardiovascular Disease

## 2016-04-14 NOTE — Telephone Encounter (Signed)
Rx(s) sent to pharmacy electronically.  

## 2016-05-03 ENCOUNTER — Ambulatory Visit (INDEPENDENT_AMBULATORY_CARE_PROVIDER_SITE_OTHER): Payer: Medicare Other | Admitting: Cardiovascular Disease

## 2016-05-03 ENCOUNTER — Encounter: Payer: Self-pay | Admitting: Cardiovascular Disease

## 2016-05-03 VITALS — BP 136/64 | HR 57 | Ht 59.0 in | Wt 128.0 lb

## 2016-05-03 DIAGNOSIS — E785 Hyperlipidemia, unspecified: Secondary | ICD-10-CM | POA: Diagnosis not present

## 2016-05-03 DIAGNOSIS — I10 Essential (primary) hypertension: Secondary | ICD-10-CM | POA: Diagnosis not present

## 2016-05-03 DIAGNOSIS — Z8673 Personal history of transient ischemic attack (TIA), and cerebral infarction without residual deficits: Secondary | ICD-10-CM

## 2016-05-03 DIAGNOSIS — G2581 Restless legs syndrome: Secondary | ICD-10-CM | POA: Diagnosis not present

## 2016-05-03 DIAGNOSIS — E7800 Pure hypercholesterolemia, unspecified: Secondary | ICD-10-CM

## 2016-05-03 DIAGNOSIS — R001 Bradycardia, unspecified: Secondary | ICD-10-CM

## 2016-05-03 NOTE — Patient Instructions (Signed)
Your physician wants you to follow-up in: 6 months or sooner  If needed. You will receive a reminder letter in the mail two months in advance. If you don't receive a letter, please call our office to schedule the follow-up appointment.   If you need a refill on your cardiac medications before your next appointment, please call your pharmacy.   

## 2016-05-03 NOTE — Progress Notes (Signed)
Patient ID: MIHO MONDA, female   DOB: 02-13-1923, 83 y.o.   MRN: [id number]     HPI: YENG FRANKIE is a 83 y.o. female who presents to the office for an 8 month cardiology followup evaluation.  Ms. Lafferty is the wife of my former patient Dr. Buckner Malta who died on 2015-04-01.  Remotely, Ms. Gruenberg suffered a TIA and has documented old asymptomatic lacunar infarcts. Prior to undergoing urologic surgery at [facility] in 2012 a nuclear perfusion study showed normal perfusion. An echo Doppler study demonstrated mild to moderate TR, trace MR, and mild aortic sclerosis without stenosis. She had normal systolic function. Carotid studies done in 2011 were normal.  She was  hospitalized for 2 nights after experiencing an TIA on 01/08/2013. Her symptoms resolved spontaneously. A CT of her head showed mild atrophy with extensive supratentorial small vessel disease without intracranial mass, hemorrhage or acute appearing infarction. An MRI of her brain did not show any acute intracranial findings but did show advanced atrophy with chronic microvascular ischemic changes. MRA did not reveal any intracranial flow reducing lesions. During that hospitalization a f/u echo Doppler study  on 01/29/2013 showed an ejection fraction at 65%. There was mild aortic sclerosis without stenosis. Carotid Doppler study showed minimal plaque with less than 39% reduction. She  was sent home on atorvastatin 10 mg Plavix 75 mg in addition to her valsartan HCT 320/12.5 and zolpidem.  She has had issues with swelling of legs and feet.  She also has continued issues with restless legs.  She has been taking requip 0.5 mg shortly before going to bed, but it takes a while for the medication to work and usually does not completely suffice.  At times she has taken a half a pill if she is to have a daytime nap.  When I last saw her, she was complaining of experiencing a "bubble" sensation.  She underwent carotid duplex imaging which was  normal in November 2016.  She denies any episodes of chest pain.  She is unaware of palpitations.  She admits to being fatigued , but actually feels improved from previously.  At times if she is on her back for certain duration.  She does note transient right arm paresthesias.  She denies associated chest pressure.  She presents for evaluation.  She underwent extensive dental work with her dentist, Chief Financial Officer, and endodontist.  She also went eyelid lift surgery at  [facility] in December 2017 and tolerated this well from a cardiovascular standpoint.  He denies any chest pain.  She has had some issues with anxiety and in July also had some issues with transient right arm numbness.  She lives at [facility] independent living.  She presents for reevaluation.  Past Medical History:  Diagnosis Date  . Hypertension   . IBS (irritable bowel syndrome)   . Stroke ([place])   . TIA (transient ischemic attack)     Past Surgical History:  Procedure Laterality Date  . ABDOMINAL HYSTERECTOMY  1978  . BUNIONECTOMY  2006, 1996  . LAPAROSCOPIC SALPINGO OOPHERECTOMY  1988    Allergies  Allergen Reactions  . Duloxetine Other (See Comments)    unknown  . Erythromycin   . Mirtazapine     Other reaction(s): Other (See Comments) unknown  . Tramadol     Other reaction(s): Other (See Comments) unknown  . Trazodone     Other reaction(s): Other (See Comments) unknown  . Zolpidem     Other reaction(s): Other (  See Comments) unknown  . Cyclobenzaprine Anxiety    Unknown  . Diazepam Rash    unknown    Current Outpatient Prescriptions  Medication Sig Dispense Refill  . atorvastatin (LIPITOR) 10 MG tablet TAKE 1/2 TABLET EVERY DAY. 45 tablet 1  . Biotin 5000 MCG CAPS Take 1 capsule by mouth daily.    . Calcium Carb-Cholecalciferol (CALCIUM 600 + D) 600-200 MG-UNIT TABS Take 1 tablet by mouth 2 (two) times daily.    . Cholecalciferol (VITAMIN D) 2000 UNITS CAPS Take 1 capsule by mouth daily.     . clopidogrel (PLAVIX) 75 MG tablet Take 75 mg by mouth daily.    Marland Kitchen gabapentin (NEURONTIN) 100 MG capsule Take 1 capsule by mouth daily.    Marland Kitchen guaiFENesin (MUCINEX) 600 MG 12 hr tablet Take 1,200 mg by mouth daily as needed for congestion.    . metoprolol succinate (TOPROL-XL) 25 MG 24 hr tablet TAKE 1/2 TABLET DAILY. DO NOT TAKE IF HEART RATE IS LESS THAN 55. 30 tablet 1  . Multiple Vitamin (MULTIVITAMIN) capsule Take 1 capsule by mouth daily.    Marland Kitchen rOPINIRole (REQUIP) 0.5 MG tablet Take 1.5 tablets by mouth at bedtime. 3 hours before bed    . valsartan-hydrochlorothiazide (DIOVAN-HCT) 160-25 MG per tablet Take 1 tablet by mouth daily after breakfast.     No current facility-administered medications for this visit.     Social History   Social History  . Marital status: Married    Spouse name: N/A  . Number of children: N/A  . Years of education: N/A   Occupational History  . Not on file.   Social History Main Topics  . Smoking status: Never Smoker  . Smokeless tobacco: Never Used  . Alcohol use No  . Drug use: No  . Sexual activity: Not on file   Other Topics Concern  . Not on file   Social History Narrative  . No narrative on file   Socially she is widowed as of December 2016.  Her husband was a former Scientist, physiological of education that [employer] and developed progressive dementia Prior to his death  Family History  Problem Relation Age of Onset  . COPD Mother   . Schizophrenia Brother   . Stroke Father    ROS General: Negative; No fevers, chills, or night sweats;  HEENT: Negative; No changes in vision or hearing, sinus congestion, difficulty swallowing Pulmonary: Negative; No cough, wheezing, shortness of breath, hemoptysis Cardiovascular: Negative; No chest pain, presyncope, syncope, palpitations Positive for leg swelling  GI: Negative; No nausea, vomiting, diarrhea, or abdominal pain GU: Positive for recurrent urinary incontinence despite her prior urologic surgery;  No dysuria, hematuria, or difficulty voiding Musculoskeletal: Negative; no myalgias, joint pain, or weakness Hematologic/Oncology: Negative; no easy bruising, bleeding Endocrine: Negative; no heat/cold intolerance; no diabetes Neuro: Positive for decreased short-term memory and history of TIA; Episode of right arm numbness in July 2017; no changes in balance, headaches Skin: Negative; No rashes or skin lesions Psychiatric: Negative; No behavioral problems, depression Sleep: Positive for restless leg syndrome; No snoring, daytime sleepiness, hypersomnolence, bruxism, hypnogognic hallucinations, no cataplexy Other comprehensive 14 point system review is negative.   PE BP 136/64   Pulse (!) 57   Ht '4\' 11"'$  (1.499 m)   Wt 128 lb (58.1 kg)   BMI 25.85 kg/m    Repeat blood pressure was 116/64  Wt Readings from Last 3 Encounters:  05/03/16 128 lb (58.1 kg)  10/09/15 130 lb 3.2 oz (59.1  kg)  08/26/15 127 lb (57.6 kg)    General: Alert, oriented, no distress.  Skin: normal turgor, no rashes HEENT: Normocephalic, atraumatic. Pupils round and reactive; sclera anicteric;no lid lag.  Nose without nasal septal hypertrophy Mouth/Parynx benign; Mallinpatti scale 2 Neck: No JVD, no carotid bruits; normal carotid upstroke Lungs: clear to ausculatation and percussion; no wheezing or rales Chest wall: Nontender to palpation Heart: RRR, s1 s2 normal 1/6 systolic murmur; no diastolic murmur.  No rubs thrills or heaves.  No ectopy. Abdomen: soft, nontender; no hepatosplenomehaly, BS+; abdominal aorta nontender and not dilated by palpation. Back: No CVA tenderness Pulses 2+ Extremities: Trace ankle swelling.  no clubbing cyanosis , Homan's sign negative  Neurologic: grossly nonfocal Psychologic: Normal affect and mood.  ECG (independently read by me): Sinus bradycardia 57 bpm.  No ST segment changes.  There was malpositioning of her anterior leads.  May 2017 ECG (independently read by me): Sinus  bradycardia 53 bpm.  No ectopy.  Normal intervals.  No ST segment changes.  November 2016 ECG (independently read by me): Sinus bradycardia 58 bpm.  Normal intervals.    October 2015 ECG (and apparently read by me) : Sinus rhythm with frequent PACs and transient atrial bigeminal pattern  Prior November 2014 ECG: Sinus rhythm with occasional PACs. Intervals are normal. Nonspecific T changes.  LABS: BMP Latest Ref Rng & Units 01/28/2013 01/28/2013 04/29/2009  Glucose 70 - 99 mg/dL - 738(V) 96  BUN 6 - 23 mg/dL - 17 17  Creatinine 8.53 - 1.10 mg/dL 7.14 6.89 7.28  Sodium 135 - 145 mEq/L - 139 143  Potassium 3.5 - 5.1 mEq/L - 4.1 4.0  Chloride 96 - 112 mEq/L - 102 109  CO2 19 - 32 mEq/L - 26 29  Calcium 8.4 - 10.5 mg/dL - 18.7 8.6   Hepatic Function Latest Ref Rng & Units 01/28/2013 04/28/2009  Total Protein 6.0 - 8.3 g/dL 7.4 9.7(I)  Albumin 3.5 - 5.2 g/dL 4.3 6.6(C)  AST 0 - 37 U/L 22 15  ALT 0 - 35 U/L 20 16  Alk Phosphatase 39 - 117 U/L 73 46  Total Bilirubin 0.3 - 1.2 mg/dL 0.4 0.4  Bilirubin, Direct 0.0 - 0.3 mg/dL - 0.2   CBC Latest Ref Rng & Units 01/28/2013 01/28/2013 04/28/2009  WBC 4.0 - 10.5 K/uL 5.8 6.5 6.3  Hemoglobin 12.0 - 15.0 g/dL 04.6 58.6 11.7  Hematocrit 36.0 - 46.0 % 39.1 41.9 35.5(L)  Platelets 150 - 400 K/uL 170 202 161   Lab Results  Component Value Date   TSH 2.19 10/09/2015     Lipid Panel     Component Value Date/Time   CHOL 118 01/29/2013 0520   TRIG 66 01/29/2013 0520   HDL 66 01/29/2013 0520   CHOLHDL 1.8 01/29/2013 0520   VLDL 13 01/29/2013 0520   LDLCALC 39 01/29/2013 0520     RADIOLOGY: Ct Head (brain) Wo Contrast  01/28/2013   CLINICAL DATA:  Right upper extremity weakness and numbness  EXAM: CT HEAD WITHOUT CONTRAST  TECHNIQUE: Contiguous axial images were obtained from the base of the skull through the vertex without intravenous contrast. Study was obtained within 24 hr of patient's arrival at the emergency department.  COMPARISON:   Brain MRI April 27, 2009 and brain CT April 27, 2009  FINDINGS: There is mild diffuse atrophy. There is no mass, hemorrhage, extra-axial fluid collection, or midline shift. There is extensive small vessel disease throughout the centra semiovale bilaterally, a stable finding. There  is no new gray-white compartment lesion. There is no demonstrable acute infarct. Bony calvarium appears intact. The mastoid air cells are clear.  IMPRESSION: Mild atrophy with extensive supratentorial small vessel disease. No intracranial mass, hemorrhage, or acute appearing infarct.   Electronically Signed   By: Lowella Grip M.D.   On: 01/28/2013 14:42   Mr Brain Wo Contrast  01/28/2013   CLINICAL DATA:  Numbness in the arms. Dry hacking cough. History of hypertension and dyslipidemia.  EXAM: MRI HEAD WITHOUT CONTRAST  MRA HEAD WITHOUT CONTRAST  TECHNIQUE: Multiplanar, multiecho pulse sequences of the brain and surrounding structures were obtained without intravenous contrast. Angiographic images of the head were obtained using MRA technique without contrast.  COMPARISON:  CT 01/28/2013.  FINDINGS: MRI HEAD FINDINGS  The patient was unable to remain motionless for the exam. Small or subtle lesions could be overlooked.  No evidence for acute infarction, hemorrhage, mass lesion, hydrocephalus, or extra-axial fluid. Moderate age-related atrophy. Extensive chronic microvascular ischemic change. Basal ganglia mineralization but no foci of chronic hemorrhage. Flow voids are maintained. No osseous findings. No remote large vessel infarct. Bilateral cataract extraction. No acute sinus or mastoid fluid. Good general agreement with prior CT.  MRA HEAD FINDINGS  Grossly patent internal carotid arteries, and basilar artery. Vertebrals are codominant. No flow-limiting intracranial stenosis, branch occlusion, or aneurysm is evident on this motion degraded exam.  IMPRESSION: MRI HEAD IMPRESSION  No acute intracranial findings are evident.  There is advanced atrophy with chronic microvascular ischemic change.  MRA HEAD IMPRESSION  No intracranial flow reducing lesion is evident.   Electronically Signed   By: Rolla Flatten M.D.   On: 01/28/2013 18:47   Mr Jodene Nam Head/brain Wo Cm  01/28/2013   CLINICAL DATA:  Numbness in the arms. Dry hacking cough. History of hypertension and dyslipidemia.  EXAM: MRI HEAD WITHOUT CONTRAST  MRA HEAD WITHOUT CONTRAST  TECHNIQUE: Multiplanar, multiecho pulse sequences of the brain and surrounding structures were obtained without intravenous contrast. Angiographic images of the head were obtained using MRA technique without contrast.  COMPARISON:  CT 01/28/2013.  FINDINGS: MRI HEAD FINDINGS  The patient was unable to remain motionless for the exam. Small or subtle lesions could be overlooked.  No evidence for acute infarction, hemorrhage, mass lesion, hydrocephalus, or extra-axial fluid. Moderate age-related atrophy. Extensive chronic microvascular ischemic change. Basal ganglia mineralization but no foci of chronic hemorrhage. Flow voids are maintained. No osseous findings. No remote large vessel infarct. Bilateral cataract extraction. No acute sinus or mastoid fluid. Good general agreement with prior CT.  MRA HEAD FINDINGS  Grossly patent internal carotid arteries, and basilar artery. Vertebrals are codominant. No flow-limiting intracranial stenosis, branch occlusion, or aneurysm is evident on this motion degraded exam.  IMPRESSION: MRI HEAD IMPRESSION  No acute intracranial findings are evident. There is advanced atrophy with chronic microvascular ischemic change.  MRA HEAD IMPRESSION  No intracranial flow reducing lesion is evident.   Electronically Signed   By: Rolla Flatten M.D.   On: 01/28/2013 18:47   IMPRESSION:  1. Essential hypertension   2. Dyslipidemia   3. Sinus bradycardia   4. Restless leg syndrome   5. Pure hypercholesterolemia   6. History of TIA (transient ischemic attack)      ASSESSMENT AND  PLAN: Ms. Iwata is a 83 years old Caucasian female with history of prior TIA's and remotely had been  diagnosed as having a TIA with old asymptomatic lacunar infarct. On CT, MRI and MRA studies there is  significant small vessel disease of her brain.  She does have normal systolic function and evidence for aortic sclerosis on echo assessment. She is on Plavix with her TIA history.  Presently, her blood pressure is well controlled on valsartan HCT 160/25 mg in addition to her Toprol-XL 12.5 mg daily.  Edema has significantly improved with the HCTZ component to her ARB therapy.  Fatigue has improved with the taking of metoprolol 12.5 mg at bedtime.  She continues to take Plavix for her history of TIA.  She is on atorvastatin for hyperlipidemia and is tolerating this well without myalgias.  I suspect some of her arm paresthesias may be positionally related to her sleep and she may very well have degenerative joint disease of her spine.  She is now treated for neuropathy.  I reassured her that I did not believe this was cardiac.  She continues to have restless legs and is on request and gabapentin.  She has held her Plavix prior to her eyelid surgery.  She had been very anxious and previously was taking blood pressure very regularly.  She now has not been doing this and her blood pressure is actually better.  I will see her in 6 months for reevaluation.   Time spent: 25 minutes  Troy Sine, MD, [facility]  05/03/2016 6:44 PM

## 2016-05-04 DIAGNOSIS — M810 Age-related osteoporosis without current pathological fracture: Secondary | ICD-10-CM | POA: Diagnosis not present

## 2016-07-05 DIAGNOSIS — L600 Ingrowing nail: Secondary | ICD-10-CM | POA: Diagnosis not present

## 2016-07-05 DIAGNOSIS — G2581 Restless legs syndrome: Secondary | ICD-10-CM | POA: Diagnosis not present

## 2016-07-19 ENCOUNTER — Ambulatory Visit (INDEPENDENT_AMBULATORY_CARE_PROVIDER_SITE_OTHER): Payer: Medicare Other | Admitting: Podiatry

## 2016-07-19 ENCOUNTER — Encounter: Payer: Self-pay | Admitting: Podiatry

## 2016-07-19 VITALS — BP 137/66 | HR 60 | Resp 16 | Ht 59.75 in | Wt 125.0 lb

## 2016-07-19 DIAGNOSIS — L03032 Cellulitis of left toe: Secondary | ICD-10-CM | POA: Diagnosis not present

## 2016-07-19 MED ORDER — MUPIROCIN 2 % EX OINT: TOPICAL_OINTMENT | CUTANEOUS | 2 refills | Status: DC

## 2016-07-19 MED ORDER — DOXYCYCLINE HYCLATE 100 MG PO TABS: 100.0000 mg | ORAL_TABLET | Freq: Two times a day (BID) | ORAL | 0 refills | Status: DC

## 2016-07-19 NOTE — Progress Notes (Signed)
   Subjective:    Patient ID: Valerie Barker, female    DOB: [birth year], 83 y.o.   MRN: [id number]  HPI: She presents today with her son with a chief complaint of pain to the proximal nail fold of the hallux left. She was told by her PCP there is an ingrown toenail and it needed to be checked. She denies trauma to the nail but states that she wears tight shoes and that the nail has not grown in several weeks. She states that she's been soaking in Epsom salts and warm water. Her son states that it looks much better than it did earlier. There's been no purulence.  Review of Systems  Neurological: Positive for dizziness, weakness and light-headedness.  Hematological: Bruises/bleeds easily.  All other systems reviewed and are negative.      Objective:   Physical Exam: Vital signs are stable alert and oriented 3 pulses are palpable. Neurologic sensorium is intact. Tendon reflexes are intact muscle strength is normal and symmetrical bilateral. Orthopedic evaluation of his result was distal to the ankle for range of motion or crepitation. Cutaneous evaluation does demonstrate a mildly inflamed proximal nail fold tibial and fibular borders are nontender. There is no signs of purulence or malodor.        Assessment & Plan:  Paronychia hallux left.  Plan: Start her on doxycycline and Bactroban ointment encouraged her to continue to soak at least once a day. Follow up with me in 2 weeks.

## 2016-07-21 ENCOUNTER — Other Ambulatory Visit: Payer: Self-pay | Admitting: Cardiovascular Disease

## 2016-07-21 NOTE — Telephone Encounter (Signed)
REFILL 

## 2016-07-28 ENCOUNTER — Telehealth: Payer: Self-pay | Admitting: *Deleted

## 2016-07-28 NOTE — Telephone Encounter (Signed)
Pt states she was seen 07/19/2016 for a infected toe and was given oral antibiotics and topical. Pt states the redness and swelling are better, but she is going out of town, would DR. Hyatt want to refill the antibiotics. I told pt if the toe continued to improve continue the antibiotic ointment to completion, continue epsom salt soak for 4-6 weeks and topical after the soaks, which should be about time for her appt. I told pt if the symptoms regressed to give [place] a call. Pt states understanding.

## 2016-08-09 ENCOUNTER — Encounter: Payer: Self-pay | Admitting: Podiatry

## 2016-08-09 ENCOUNTER — Ambulatory Visit (INDEPENDENT_AMBULATORY_CARE_PROVIDER_SITE_OTHER): Payer: Medicare Other | Admitting: Podiatry

## 2016-08-09 DIAGNOSIS — L03032 Cellulitis of left toe: Secondary | ICD-10-CM | POA: Diagnosis not present

## 2016-08-09 NOTE — Progress Notes (Signed)
She presents today for follow-up of her paronychia hallux left. She continues her Bactroban ointment.  Objective: Vital signs are stable she's alert and oriented 3. Much decrease in edema and erythema from previous visit. She does have some remaining erythema noted. Was no a today. There is no sign of infection at this point.  Assessment: Mild paronychia.  Plan: Continue soaks and application of Bactroban ointment. Follow up with me if condition worsens.

## 2016-08-22 DIAGNOSIS — R5382 Chronic fatigue, unspecified: Secondary | ICD-10-CM | POA: Diagnosis not present

## 2016-08-22 DIAGNOSIS — E538 Deficiency of other specified B group vitamins: Secondary | ICD-10-CM | POA: Diagnosis not present

## 2016-08-22 DIAGNOSIS — I10 Essential (primary) hypertension: Secondary | ICD-10-CM | POA: Diagnosis not present

## 2016-08-22 DIAGNOSIS — G2581 Restless legs syndrome: Secondary | ICD-10-CM | POA: Diagnosis not present

## 2016-08-24 ENCOUNTER — Other Ambulatory Visit: Payer: Self-pay | Admitting: Cardiovascular Disease

## 2016-08-24 NOTE — Telephone Encounter (Signed)
REFILL 

## 2016-09-07 DIAGNOSIS — M6281 Muscle weakness (generalized): Secondary | ICD-10-CM | POA: Diagnosis not present

## 2016-09-07 DIAGNOSIS — R260 Ataxic gait: Secondary | ICD-10-CM | POA: Diagnosis not present

## 2016-09-07 DIAGNOSIS — H8113 Benign paroxysmal vertigo, bilateral: Secondary | ICD-10-CM | POA: Diagnosis not present

## 2016-09-13 DIAGNOSIS — H8113 Benign paroxysmal vertigo, bilateral: Secondary | ICD-10-CM | POA: Diagnosis not present

## 2016-09-13 DIAGNOSIS — M6281 Muscle weakness (generalized): Secondary | ICD-10-CM | POA: Diagnosis not present

## 2016-09-13 DIAGNOSIS — R260 Ataxic gait: Secondary | ICD-10-CM | POA: Diagnosis not present

## 2016-09-15 DIAGNOSIS — H8113 Benign paroxysmal vertigo, bilateral: Secondary | ICD-10-CM | POA: Diagnosis not present

## 2016-09-15 DIAGNOSIS — R260 Ataxic gait: Secondary | ICD-10-CM | POA: Diagnosis not present

## 2016-09-15 DIAGNOSIS — M6281 Muscle weakness (generalized): Secondary | ICD-10-CM | POA: Diagnosis not present

## 2016-09-19 DIAGNOSIS — R5383 Other fatigue: Secondary | ICD-10-CM | POA: Diagnosis not present

## 2016-09-19 DIAGNOSIS — R400 Somnolence: Secondary | ICD-10-CM | POA: Diagnosis not present

## 2016-09-19 DIAGNOSIS — M79675 Pain in left toe(s): Secondary | ICD-10-CM | POA: Diagnosis not present

## 2016-09-21 DIAGNOSIS — M6281 Muscle weakness (generalized): Secondary | ICD-10-CM | POA: Diagnosis not present

## 2016-09-21 DIAGNOSIS — R260 Ataxic gait: Secondary | ICD-10-CM | POA: Diagnosis not present

## 2016-09-21 DIAGNOSIS — H8113 Benign paroxysmal vertigo, bilateral: Secondary | ICD-10-CM | POA: Diagnosis not present

## 2016-09-22 ENCOUNTER — Ambulatory Visit (INDEPENDENT_AMBULATORY_CARE_PROVIDER_SITE_OTHER): Payer: Medicare Other | Admitting: Podiatry

## 2016-09-22 ENCOUNTER — Encounter: Payer: Self-pay | Admitting: Podiatry

## 2016-09-22 VITALS — BP 137/72 | HR 57 | Resp 16

## 2016-09-22 DIAGNOSIS — L03032 Cellulitis of left toe: Secondary | ICD-10-CM | POA: Diagnosis not present

## 2016-09-22 NOTE — Progress Notes (Signed)
Subjective:    Patient ID: Valerie Barker, female   DOB: 83 y.o.   MRN: [id number]   HPI patient presents with caregiver concerned about the left hallux nail stating that it's been sore. She is not noted drainage recently but she did have a excision done last month    ROS      Objective:  Physical Exam neurovascular status intact with good digital perfusion and patient's left hallux showing a slight amount of proximal redness within the nail fold that's localized that was not significant we tender when palpated. I noted no drainage and I did not see extensive loosening of the nailbed     Assessment:    Appears to be a localized process but I could not identify a significant infective process     Plan:    H&P condition reviewed and this point I recommended continued soaks of the area along with padding and I dispensed a silicone pad. If symptoms persist we could remove the nail but were trying to hold off on that currently

## 2016-09-22 NOTE — Patient Instructions (Signed)

## 2016-09-26 DIAGNOSIS — H04123 Dry eye syndrome of bilateral lacrimal glands: Secondary | ICD-10-CM | POA: Diagnosis not present

## 2016-09-26 DIAGNOSIS — Z961 Presence of intraocular lens: Secondary | ICD-10-CM | POA: Diagnosis not present

## 2016-09-26 DIAGNOSIS — H5211 Myopia, right eye: Secondary | ICD-10-CM | POA: Diagnosis not present

## 2016-09-28 DIAGNOSIS — M6281 Muscle weakness (generalized): Secondary | ICD-10-CM | POA: Diagnosis not present

## 2016-09-28 DIAGNOSIS — R260 Ataxic gait: Secondary | ICD-10-CM | POA: Diagnosis not present

## 2016-09-28 DIAGNOSIS — H8113 Benign paroxysmal vertigo, bilateral: Secondary | ICD-10-CM | POA: Diagnosis not present

## 2016-10-04 DIAGNOSIS — R260 Ataxic gait: Secondary | ICD-10-CM | POA: Diagnosis not present

## 2016-10-04 DIAGNOSIS — M6281 Muscle weakness (generalized): Secondary | ICD-10-CM | POA: Diagnosis not present

## 2016-10-04 DIAGNOSIS — H8113 Benign paroxysmal vertigo, bilateral: Secondary | ICD-10-CM | POA: Diagnosis not present

## 2016-10-06 DIAGNOSIS — H8113 Benign paroxysmal vertigo, bilateral: Secondary | ICD-10-CM | POA: Diagnosis not present

## 2016-10-06 DIAGNOSIS — M6281 Muscle weakness (generalized): Secondary | ICD-10-CM | POA: Diagnosis not present

## 2016-10-06 DIAGNOSIS — R260 Ataxic gait: Secondary | ICD-10-CM | POA: Diagnosis not present

## 2016-10-20 ENCOUNTER — Other Ambulatory Visit: Payer: Self-pay | Admitting: Cardiovascular Disease

## 2016-10-20 NOTE — Telephone Encounter (Signed)
Rx(s) sent to pharmacy electronically.  

## 2016-10-25 DIAGNOSIS — R5383 Other fatigue: Secondary | ICD-10-CM | POA: Diagnosis not present

## 2016-10-25 DIAGNOSIS — G2581 Restless legs syndrome: Secondary | ICD-10-CM | POA: Diagnosis not present

## 2016-10-25 DIAGNOSIS — K5900 Constipation, unspecified: Secondary | ICD-10-CM | POA: Diagnosis not present

## 2016-10-31 DIAGNOSIS — H8113 Benign paroxysmal vertigo, bilateral: Secondary | ICD-10-CM | POA: Diagnosis not present

## 2016-10-31 DIAGNOSIS — M6281 Muscle weakness (generalized): Secondary | ICD-10-CM | POA: Diagnosis not present

## 2016-10-31 DIAGNOSIS — R260 Ataxic gait: Secondary | ICD-10-CM | POA: Diagnosis not present

## 2016-11-02 DIAGNOSIS — M6281 Muscle weakness (generalized): Secondary | ICD-10-CM | POA: Diagnosis not present

## 2016-11-02 DIAGNOSIS — R260 Ataxic gait: Secondary | ICD-10-CM | POA: Diagnosis not present

## 2016-11-02 DIAGNOSIS — H8113 Benign paroxysmal vertigo, bilateral: Secondary | ICD-10-CM | POA: Diagnosis not present

## 2016-11-17 DIAGNOSIS — H8113 Benign paroxysmal vertigo, bilateral: Secondary | ICD-10-CM | POA: Diagnosis not present

## 2016-11-17 DIAGNOSIS — M6281 Muscle weakness (generalized): Secondary | ICD-10-CM | POA: Diagnosis not present

## 2016-11-17 DIAGNOSIS — R260 Ataxic gait: Secondary | ICD-10-CM | POA: Diagnosis not present

## 2016-11-21 DIAGNOSIS — M6281 Muscle weakness (generalized): Secondary | ICD-10-CM | POA: Diagnosis not present

## 2016-11-21 DIAGNOSIS — H8113 Benign paroxysmal vertigo, bilateral: Secondary | ICD-10-CM | POA: Diagnosis not present

## 2016-11-21 DIAGNOSIS — R260 Ataxic gait: Secondary | ICD-10-CM | POA: Diagnosis not present

## 2016-11-24 DIAGNOSIS — R260 Ataxic gait: Secondary | ICD-10-CM | POA: Diagnosis not present

## 2016-11-24 DIAGNOSIS — M6281 Muscle weakness (generalized): Secondary | ICD-10-CM | POA: Diagnosis not present

## 2016-11-24 DIAGNOSIS — H8113 Benign paroxysmal vertigo, bilateral: Secondary | ICD-10-CM | POA: Diagnosis not present

## 2016-11-28 DIAGNOSIS — M6281 Muscle weakness (generalized): Secondary | ICD-10-CM | POA: Diagnosis not present

## 2016-11-28 DIAGNOSIS — H8113 Benign paroxysmal vertigo, bilateral: Secondary | ICD-10-CM | POA: Diagnosis not present

## 2016-11-28 DIAGNOSIS — R260 Ataxic gait: Secondary | ICD-10-CM | POA: Diagnosis not present

## 2016-12-06 DIAGNOSIS — K5900 Constipation, unspecified: Secondary | ICD-10-CM | POA: Diagnosis not present

## 2016-12-06 DIAGNOSIS — R5383 Other fatigue: Secondary | ICD-10-CM | POA: Diagnosis not present

## 2016-12-06 DIAGNOSIS — G2581 Restless legs syndrome: Secondary | ICD-10-CM | POA: Diagnosis not present

## 2016-12-08 DIAGNOSIS — H8113 Benign paroxysmal vertigo, bilateral: Secondary | ICD-10-CM | POA: Diagnosis not present

## 2016-12-08 DIAGNOSIS — R260 Ataxic gait: Secondary | ICD-10-CM | POA: Diagnosis not present

## 2016-12-08 DIAGNOSIS — M6281 Muscle weakness (generalized): Secondary | ICD-10-CM | POA: Diagnosis not present

## 2016-12-12 DIAGNOSIS — M6281 Muscle weakness (generalized): Secondary | ICD-10-CM | POA: Diagnosis not present

## 2016-12-12 DIAGNOSIS — R260 Ataxic gait: Secondary | ICD-10-CM | POA: Diagnosis not present

## 2016-12-12 DIAGNOSIS — H8113 Benign paroxysmal vertigo, bilateral: Secondary | ICD-10-CM | POA: Diagnosis not present

## 2016-12-13 ENCOUNTER — Other Ambulatory Visit: Payer: Self-pay | Admitting: Cardiovascular Disease

## 2016-12-13 NOTE — Telephone Encounter (Signed)
REFILL 

## 2016-12-19 DIAGNOSIS — M6281 Muscle weakness (generalized): Secondary | ICD-10-CM | POA: Diagnosis not present

## 2016-12-19 DIAGNOSIS — H8113 Benign paroxysmal vertigo, bilateral: Secondary | ICD-10-CM | POA: Diagnosis not present

## 2016-12-19 DIAGNOSIS — R260 Ataxic gait: Secondary | ICD-10-CM | POA: Diagnosis not present

## 2016-12-20 ENCOUNTER — Ambulatory Visit (INDEPENDENT_AMBULATORY_CARE_PROVIDER_SITE_OTHER): Payer: Medicare Other | Admitting: Cardiovascular Disease

## 2016-12-20 ENCOUNTER — Encounter: Payer: Self-pay | Admitting: Cardiovascular Disease

## 2016-12-20 VITALS — BP 130/66 | HR 53 | Ht 59.5 in | Wt 126.0 lb

## 2016-12-20 DIAGNOSIS — R001 Bradycardia, unspecified: Secondary | ICD-10-CM

## 2016-12-20 DIAGNOSIS — G2581 Restless legs syndrome: Secondary | ICD-10-CM | POA: Diagnosis not present

## 2016-12-20 DIAGNOSIS — Z8673 Personal history of transient ischemic attack (TIA), and cerebral infarction without residual deficits: Secondary | ICD-10-CM

## 2016-12-20 DIAGNOSIS — I10 Essential (primary) hypertension: Secondary | ICD-10-CM

## 2016-12-20 DIAGNOSIS — R42 Dizziness and giddiness: Secondary | ICD-10-CM

## 2016-12-20 MED ORDER — LOSARTAN POTASSIUM-HCTZ 100-12.5 MG PO TABS: 1.0000 | ORAL_TABLET | Freq: Every day | ORAL | 3 refills | Status: DC

## 2016-12-20 NOTE — Patient Instructions (Signed)
Medication Instructions:  Take your metoprolol succinate (Toprol XL) at bedtime  DECREASE Losartan/HCTZ to 100/12.5mg  tablet one time daily-a new prescription has been sent to your pharmacy.  Follow-Up: Your physician wants you to follow-up in: 6 MONTHS with Dr. Claiborne Billings.  You will receive a reminder letter in the mail two months in advance. If you don't receive a letter, please call our office to schedule the follow-up appointment.   Any Other Special Instructions Will Be Listed Below (If Applicable).     If you need a refill on your cardiac medications before your next appointment, please call your pharmacy.

## 2016-12-20 NOTE — Progress Notes (Signed)
Patient ID: Valerie Barker, female   DOB: [birth year], 83 y.o.   MRN: [id number]     HPI: Valerie Barker is a 83 y.o. female who presents to the office for an 8 month cardiology followup evaluation.  Valerie Barker is the wife of my former patient Dr. Buckner Malta who died on 03/18/15.  Remotely, Valerie Barker suffered a TIA and has documented old asymptomatic lacunar infarcts. Prior to undergoing urologic surgery at [facility] in 2012 a nuclear perfusion study showed normal perfusion. An echo Doppler study demonstrated mild to moderate TR, trace MR, and mild aortic sclerosis without stenosis. She had normal systolic function. Carotid studies done in 2011 were normal.  She was  hospitalized for 2 nights after experiencing an TIA on 01/08/2013. Her symptoms resolved spontaneously. A CT of her head showed mild atrophy with extensive supratentorial small vessel disease without intracranial mass, hemorrhage or acute appearing infarction. An MRI of her brain did not show any acute intracranial findings but did show advanced atrophy with chronic microvascular ischemic changes. MRA did not reveal any intracranial flow reducing lesions. During that hospitalization a f/u echo Doppler study  on 01/29/2013 showed an ejection fraction at 65%. There was mild aortic sclerosis without stenosis. Carotid Doppler study showed minimal plaque with less than 39% reduction. She  was sent home on atorvastatin 10 mg Plavix 75 mg in addition to her valsartan HCT 320/12.5 and zolpidem.  She has had issues with swelling of legs and feet.  She also has continued issues with restless legs.  She has been taking requip 0.5 mg shortly before going to bed, but it takes a while for the medication to work and usually does not completely suffice.  At times she has taken a half a pill if she is to have a daytime nap.  When I last saw her, she was complaining of experiencing a "bubble" sensation.  She underwent carotid duplex imaging which was  normal in November 2016.  She denies any episodes of chest pain.  She is unaware of palpitations.  She admits to being fatigued , but actually feels improved from previously.  At times if she is on her back for certain duration.  She does note transient right arm paresthesias.  She denies associated chest pressure.  She presents for evaluation.  She underwent extensive dental work with her dentist, Chief Financial Officer, and endodontist.  She also had eyelid lift surgery at  [facility] in December 2017 and tolerated this well from a cardiovascular standpoint.  Since I last saw her, she has felt well but admits to significant tiredness and fatigue.  At times she notices some dizziness with movement.  She has restless legs for which she is on requip in addition to gabapentin.  She denies chest pain.  She is unaware of palpitations.  She has been taking losartan HCT 100/25 mg for hypertension.  She denies swelling in her legs.  She presents for evaluation.  Past Medical History:  Diagnosis Date  . Hypertension   . IBS (irritable bowel syndrome)   . Stroke ([place])   . TIA (transient ischemic attack)     Past Surgical History:  Procedure Laterality Date  . ABDOMINAL HYSTERECTOMY  1978  . BUNIONECTOMY  2006, 1996  . LAPAROSCOPIC SALPINGO OOPHERECTOMY  1988    Allergies  Allergen Reactions  . Duloxetine Other (See Comments)    unknown  . Erythromycin   . Mirtazapine     Other reaction(s): Other (See Comments)  unknown  . Tramadol     Other reaction(s): Other (See Comments) unknown  . Trazodone     Other reaction(s): Other (See Comments) unknown  . Zolpidem     Other reaction(s): Other (See Comments) unknown  . Cyclobenzaprine Anxiety    Unknown  . Diazepam Rash    unknown    Current Outpatient Prescriptions  Medication Sig Dispense Refill  . atorvastatin (LIPITOR) 10 MG tablet TAKE 1/2 TABLET EVERY DAY. 45 tablet 1  . Biotin 5000 MCG CAPS Take 1 capsule by mouth daily.    .  Calcium Carb-Cholecalciferol (CALCIUM 600 + D) 600-200 MG-UNIT TABS Take 1 tablet by mouth 2 (two) times daily.    . Cholecalciferol (VITAMIN D) 2000 UNITS CAPS Take 1 capsule by mouth daily.    . clopidogrel (PLAVIX) 75 MG tablet Take 75 mg by mouth daily.    . Cyanocobalamin (VITAMIN B-12 PO) Take 2,500 mcg by mouth daily.    Marland Kitchen gabapentin (NEURONTIN) 100 MG capsule Take 1 capsule by mouth daily.    Marland Kitchen guaiFENesin (MUCINEX) 600 MG 12 hr tablet Take 1,200 mg by mouth daily as needed for congestion.    . metoprolol succinate (TOPROL-XL) 25 MG 24 hr tablet TAKE 1/2 TABLET DAILY. DO NOT TAKE IF HEART RATE IS LESS THAN 55. 15 tablet 5  . Multiple Vitamin (MULTIVITAMIN) capsule Take 1 capsule by mouth daily.    Marland Kitchen rOPINIRole (REQUIP) 0.5 MG tablet Take 1.5 tablets by mouth at bedtime. 3 hours before bed    . losartan-hydrochlorothiazide (HYZAAR) 100-12.5 MG tablet Take 1 tablet by mouth daily. 90 tablet 3   No current facility-administered medications for this visit.     Social History   Social History  . Marital status: Married    Spouse name: N/A  . Number of children: N/A  . Years of education: N/A   Occupational History  . Not on file.   Social History Main Topics  . Smoking status: Never Smoker  . Smokeless tobacco: Never Used  . Alcohol use No  . Drug use: No  . Sexual activity: Not on file   Other Topics Concern  . Not on file   Social History Narrative  . No narrative on file   Socially she is widowed as of December 2016.  Her husband was a former Scientist, physiological of education that [employer] and developed progressive dementia Prior to his death  Family History  Problem Relation Age of Onset  . COPD Mother   . Schizophrenia Brother   . Stroke Father    ROS General: Negative; No fevers, chills, or night sweats;  HEENT: Negative; No changes in vision or hearing, sinus congestion, difficulty swallowing Pulmonary: Negative; No cough, wheezing, shortness of breath,  hemoptysis Cardiovascular: Negative; No chest pain, presyncope, syncope, palpitations Positive for leg swelling  GI: Negative; No nausea, vomiting, diarrhea, or abdominal pain GU: Positive for recurrent urinary incontinence despite her prior urologic surgery; No dysuria, hematuria, or difficulty voiding Musculoskeletal: Negative; no myalgias, joint pain, or weakness Hematologic/Oncology: Negative; no easy bruising, bleeding Endocrine: Negative; no heat/cold intolerance; no diabetes Neuro: Positive for decreased short-term memory and history of TIA; Episode of right arm numbness in July 2017; no changes in balance, headaches Skin: Negative; No rashes or skin lesions Psychiatric: Negative; No behavioral problems, depression Sleep: Positive for restless leg syndrome; No snoring, daytime sleepiness, hypersomnolence, bruxism, hypnogognic hallucinations, no cataplexy Other comprehensive 14 point system review is negative.   PE BP 130/66   Pulse Marland Kitchen)  53   Ht 4' 11.5" (1.511 m)   Wt 126 lb (57.2 kg)   BMI 25.02 kg/m    Repeat blood pressure was 112/60  Wt Readings from Last 3 Encounters:  12/20/16 126 lb (57.2 kg)  07/19/16 125 lb (56.7 kg)  05/03/16 128 lb (58.1 kg)     Physical Exam BP 130/66   Pulse (!) 53   Ht 4' 11.5" (1.511 m)   Wt 126 lb (57.2 kg)   BMI 25.02 kg/m  General: Alert, oriented, no distress.  Skin: normal turgor, no rashes, warm and dry HEENT: Normocephalic, atraumatic. Pupils equal round and reactive to light; sclera anicteric; extraocular muscles intact;  Nose without nasal septal hypertrophy Mouth/Parynx benign; Mallinpatti scale 2 Neck: No JVD, no carotid bruits; normal carotid upstroke Lungs: clear to ausculatation and percussion; no wheezing or rales Chest wall: without tenderness to palpitation Heart: PMI not displaced, RRR, s1 s2 normal, 1/6 systolic murmur, no diastolic murmur, no rubs, gallops, thrills, or heaves Abdomen: soft, nontender; no  hepatosplenomehaly, BS+; abdominal aorta nontender and not dilated by palpation. Back: no CVA tenderness Pulses 2+ Musculoskeletal: full range of motion, normal strength, no joint deformities Extremities: no clubbing cyanosis or edema, Homan's sign negative  Neurologic: grossly nonfocal; Cranial nerves grossly wnl Psychologic: Normal mood and affect   ECG (independently read by me): sinus bradycardia 53 bpm.  Normal intervals.  No ST segment changes.  Decreased voltage anterolaterally  January 2018 ECG (independently read by me): Sinus bradycardia 57 bpm.  No ST segment changes.  There was malpositioning of her anterior leads.  May 2017 ECG (independently read by me): Sinus bradycardia 53 bpm.  No ectopy.  Normal intervals.  No ST segment changes.  November 2016 ECG (independently read by me): Sinus bradycardia 58 bpm.  Normal intervals.    October 2015 ECG (and apparently read by me) : Sinus rhythm with frequent PACs and transient atrial bigeminal pattern  Prior November 2014 ECG: Sinus rhythm with occasional PACs. Intervals are normal. Nonspecific T changes.  LABS: BMP Latest Ref Rng & Units 01/28/2013 01/28/2013 04/29/2009  Glucose 70 - 99 mg/dL - 118(H) 96  BUN 6 - 23 mg/dL - 17 17  Creatinine 0.50 - 1.10 mg/dL 0.87 0.95 0.91  Sodium 135 - 145 mEq/L - 139 143  Potassium 3.5 - 5.1 mEq/L - 4.1 4.0  Chloride 96 - 112 mEq/L - 102 109  CO2 19 - 32 mEq/L - 26 29  Calcium 8.4 - 10.5 mg/dL - 10.0 8.6   Hepatic Function Latest Ref Rng & Units 01/28/2013 04/28/2009  Total Protein 6.0 - 8.3 g/dL 7.4 5.8(L)  Albumin 3.5 - 5.2 g/dL 4.3 3.3(L)  AST 0 - 37 U/L 22 15  ALT 0 - 35 U/L 20 16  Alk Phosphatase 39 - 117 U/L 73 46  Total Bilirubin 0.3 - 1.2 mg/dL 0.4 0.4  Bilirubin, Direct 0.0 - 0.3 mg/dL - 0.2   CBC Latest Ref Rng & Units 01/28/2013 01/28/2013 04/28/2009  WBC 4.0 - 10.5 K/uL 5.8 6.5 6.3  Hemoglobin 12.0 - 15.0 g/dL 13.4 14.0 12.1  Hematocrit 36.0 - 46.0 % 39.1 41.9 35.5(L)    Platelets 150 - 400 K/uL 170 202 161   Lab Results  Component Value Date   TSH 2.19 10/09/2015     Lipid Panel     Component Value Date/Time   CHOL 118 01/29/2013 0520   TRIG 66 01/29/2013 0520   HDL 66 01/29/2013 0520   CHOLHDL 1.8 01/29/2013  0520   VLDL 13 01/29/2013 0520   LDLCALC 39 01/29/2013 0520     RADIOLOGY: Ct Head (brain) Wo Contrast  01/28/2013   CLINICAL DATA:  Right upper extremity weakness and numbness  EXAM: CT HEAD WITHOUT CONTRAST  TECHNIQUE: Contiguous axial images were obtained from the base of the skull through the vertex without intravenous contrast. Study was obtained within 24 hr of patient's arrival at the emergency department.  COMPARISON:  Brain MRI April 27, 2009 and brain CT April 27, 2009  FINDINGS: There is mild diffuse atrophy. There is no mass, hemorrhage, extra-axial fluid collection, or midline shift. There is extensive small vessel disease throughout the centra semiovale bilaterally, a stable finding. There is no new gray-white compartment lesion. There is no demonstrable acute infarct. Bony calvarium appears intact. The mastoid air cells are clear.  IMPRESSION: Mild atrophy with extensive supratentorial small vessel disease. No intracranial mass, hemorrhage, or acute appearing infarct.   Electronically Signed   By: Lowella Grip M.D.   On: 01/28/2013 14:42   Mr Brain Wo Contrast  01/28/2013   CLINICAL DATA:  Numbness in the arms. Dry hacking cough. History of hypertension and dyslipidemia.  EXAM: MRI HEAD WITHOUT CONTRAST  MRA HEAD WITHOUT CONTRAST  TECHNIQUE: Multiplanar, multiecho pulse sequences of the brain and surrounding structures were obtained without intravenous contrast. Angiographic images of the head were obtained using MRA technique without contrast.  COMPARISON:  CT 01/28/2013.  FINDINGS: MRI HEAD FINDINGS  The patient was unable to remain motionless for the exam. Small or subtle lesions could be overlooked.  No evidence for acute  infarction, hemorrhage, mass lesion, hydrocephalus, or extra-axial fluid. Moderate age-related atrophy. Extensive chronic microvascular ischemic change. Basal ganglia mineralization but no foci of chronic hemorrhage. Flow voids are maintained. No osseous findings. No remote large vessel infarct. Bilateral cataract extraction. No acute sinus or mastoid fluid. Good general agreement with prior CT.  MRA HEAD FINDINGS  Grossly patent internal carotid arteries, and basilar artery. Vertebrals are codominant. No flow-limiting intracranial stenosis, branch occlusion, or aneurysm is evident on this motion degraded exam.  IMPRESSION: MRI HEAD IMPRESSION  No acute intracranial findings are evident. There is advanced atrophy with chronic microvascular ischemic change.  MRA HEAD IMPRESSION  No intracranial flow reducing lesion is evident.   Electronically Signed   By: Rolla Flatten M.D.   On: 01/28/2013 18:47   Mr Jodene Nam Head/brain Wo Cm  01/28/2013   CLINICAL DATA:  Numbness in the arms. Dry hacking cough. History of hypertension and dyslipidemia.  EXAM: MRI HEAD WITHOUT CONTRAST  MRA HEAD WITHOUT CONTRAST  TECHNIQUE: Multiplanar, multiecho pulse sequences of the brain and surrounding structures were obtained without intravenous contrast. Angiographic images of the head were obtained using MRA technique without contrast.  COMPARISON:  CT 01/28/2013.  FINDINGS: MRI HEAD FINDINGS  The patient was unable to remain motionless for the exam. Small or subtle lesions could be overlooked.  No evidence for acute infarction, hemorrhage, mass lesion, hydrocephalus, or extra-axial fluid. Moderate age-related atrophy. Extensive chronic microvascular ischemic change. Basal ganglia mineralization but no foci of chronic hemorrhage. Flow voids are maintained. No osseous findings. No remote large vessel infarct. Bilateral cataract extraction. No acute sinus or mastoid fluid. Good general agreement with prior CT.  MRA HEAD FINDINGS  Grossly patent  internal carotid arteries, and basilar artery. Vertebrals are codominant. No flow-limiting intracranial stenosis, branch occlusion, or aneurysm is evident on this motion degraded exam.  IMPRESSION: MRI HEAD IMPRESSION  No acute intracranial  findings are evident. There is advanced atrophy with chronic microvascular ischemic change.  MRA HEAD IMPRESSION  No intracranial flow reducing lesion is evident.   Electronically Signed   By: Rolla Flatten M.D.   On: 01/28/2013 18:47   IMPRESSION:  1. Essential hypertension   2. History of TIA (transient ischemic attack)   3. Dizziness   4. Sinus bradycardia   5. Restless leg syndrome      ASSESSMENT AND PLAN: Valerie Barker is a 83 years old Caucasian female with history of prior TIA's and remotely had been  diagnosed as having a TIA with old asymptomatic lacunar infarct. On CT, MRI and MRA studies  significant small vessel disease of her brain has been demonstrated.  She has normal systolic function and evidence for aortic sclerosis on echo assessment. She is on Plavix with her TIA history.  When I checked her blood pressure, it was somewhat low.  She has been on losartan HCT 100/25 mg.  There is no edema.  I have suggested that this be reduced to 100/12.5 mg.  She has noticed fatigue.  I again recommended that she change her metoprolol and take this at bedtime rather than in the morning.  She is bradycardic with a pulse in her mid 62s.  Her beta blocker may ultimately need to be discontinued if more pronounced bradycardia develops. She continues to have issues with restless leg and is on combination therapy, both with requip and gabapentin.  She is not having any bleeding.  She is tolerating atorvastatin for hyperlipidemia.  She will be seeing Dr. Shelia Media in January.  I will see her in 6 months for reevaluation.  Time spent: 25 minutes  Troy Sine, MD, [facility]  12/22/2016 5:03 PM

## 2016-12-21 DIAGNOSIS — H8113 Benign paroxysmal vertigo, bilateral: Secondary | ICD-10-CM | POA: Diagnosis not present

## 2016-12-21 DIAGNOSIS — M6281 Muscle weakness (generalized): Secondary | ICD-10-CM | POA: Diagnosis not present

## 2016-12-21 DIAGNOSIS — R260 Ataxic gait: Secondary | ICD-10-CM | POA: Diagnosis not present

## 2016-12-26 DIAGNOSIS — M6281 Muscle weakness (generalized): Secondary | ICD-10-CM | POA: Diagnosis not present

## 2016-12-26 DIAGNOSIS — R260 Ataxic gait: Secondary | ICD-10-CM | POA: Diagnosis not present

## 2016-12-26 DIAGNOSIS — H8113 Benign paroxysmal vertigo, bilateral: Secondary | ICD-10-CM | POA: Diagnosis not present

## 2016-12-28 DIAGNOSIS — R260 Ataxic gait: Secondary | ICD-10-CM | POA: Diagnosis not present

## 2016-12-28 DIAGNOSIS — H8113 Benign paroxysmal vertigo, bilateral: Secondary | ICD-10-CM | POA: Diagnosis not present

## 2016-12-28 DIAGNOSIS — M6281 Muscle weakness (generalized): Secondary | ICD-10-CM | POA: Diagnosis not present

## 2017-01-02 DIAGNOSIS — Z1212 Encounter for screening for malignant neoplasm of rectum: Secondary | ICD-10-CM | POA: Diagnosis not present

## 2017-01-02 DIAGNOSIS — Z1211 Encounter for screening for malignant neoplasm of colon: Secondary | ICD-10-CM | POA: Diagnosis not present

## 2017-01-23 ENCOUNTER — Other Ambulatory Visit: Payer: Self-pay | Admitting: Internal Medicine

## 2017-01-23 DIAGNOSIS — Z1231 Encounter for screening mammogram for malignant neoplasm of breast: Secondary | ICD-10-CM

## 2017-02-10 ENCOUNTER — Ambulatory Visit
Admission: RE | Admit: 2017-02-10 | Discharge: 2017-02-10 | Disposition: A | Discharge status: Home / Self Care | Payer: Medicare Other | Source: Ambulatory Visit | Attending: Internal Medicine | Admitting: Internal Medicine

## 2017-02-10 DIAGNOSIS — Z1231 Encounter for screening mammogram for malignant neoplasm of breast: Secondary | ICD-10-CM | POA: Diagnosis not present

## 2017-02-28 DIAGNOSIS — M5431 Sciatica, right side: Secondary | ICD-10-CM | POA: Diagnosis not present

## 2017-02-28 DIAGNOSIS — M7989 Other specified soft tissue disorders: Secondary | ICD-10-CM | POA: Diagnosis not present

## 2017-03-02 DIAGNOSIS — H8113 Benign paroxysmal vertigo, bilateral: Secondary | ICD-10-CM | POA: Diagnosis not present

## 2017-03-02 DIAGNOSIS — R260 Ataxic gait: Secondary | ICD-10-CM | POA: Diagnosis not present

## 2017-03-02 DIAGNOSIS — M6281 Muscle weakness (generalized): Secondary | ICD-10-CM | POA: Diagnosis not present

## 2017-03-02 DIAGNOSIS — M545 Low back pain: Secondary | ICD-10-CM | POA: Diagnosis not present

## 2017-03-06 DIAGNOSIS — M545 Low back pain: Secondary | ICD-10-CM | POA: Diagnosis not present

## 2017-03-06 DIAGNOSIS — M6281 Muscle weakness (generalized): Secondary | ICD-10-CM | POA: Diagnosis not present

## 2017-03-06 DIAGNOSIS — H8113 Benign paroxysmal vertigo, bilateral: Secondary | ICD-10-CM | POA: Diagnosis not present

## 2017-03-06 DIAGNOSIS — R260 Ataxic gait: Secondary | ICD-10-CM | POA: Diagnosis not present

## 2017-03-08 DIAGNOSIS — R260 Ataxic gait: Secondary | ICD-10-CM | POA: Diagnosis not present

## 2017-03-08 DIAGNOSIS — H8113 Benign paroxysmal vertigo, bilateral: Secondary | ICD-10-CM | POA: Diagnosis not present

## 2017-03-08 DIAGNOSIS — M545 Low back pain: Secondary | ICD-10-CM | POA: Diagnosis not present

## 2017-03-08 DIAGNOSIS — M6281 Muscle weakness (generalized): Secondary | ICD-10-CM | POA: Diagnosis not present

## 2017-03-16 DIAGNOSIS — M5442 Lumbago with sciatica, left side: Secondary | ICD-10-CM | POA: Diagnosis not present

## 2017-03-16 DIAGNOSIS — R05 Cough: Secondary | ICD-10-CM | POA: Diagnosis not present

## 2017-03-16 DIAGNOSIS — G2581 Restless legs syndrome: Secondary | ICD-10-CM | POA: Diagnosis not present

## 2017-03-16 DIAGNOSIS — M545 Low back pain: Secondary | ICD-10-CM | POA: Diagnosis not present

## 2017-03-20 DIAGNOSIS — R260 Ataxic gait: Secondary | ICD-10-CM | POA: Diagnosis not present

## 2017-03-20 DIAGNOSIS — M545 Low back pain: Secondary | ICD-10-CM | POA: Diagnosis not present

## 2017-03-20 DIAGNOSIS — H8113 Benign paroxysmal vertigo, bilateral: Secondary | ICD-10-CM | POA: Diagnosis not present

## 2017-03-20 DIAGNOSIS — M6281 Muscle weakness (generalized): Secondary | ICD-10-CM | POA: Diagnosis not present

## 2017-03-31 ENCOUNTER — Other Ambulatory Visit: Payer: Self-pay | Admitting: Cardiovascular Disease

## 2017-04-10 DIAGNOSIS — M6281 Muscle weakness (generalized): Secondary | ICD-10-CM | POA: Diagnosis not present

## 2017-04-10 DIAGNOSIS — H8113 Benign paroxysmal vertigo, bilateral: Secondary | ICD-10-CM | POA: Diagnosis not present

## 2017-04-10 DIAGNOSIS — R260 Ataxic gait: Secondary | ICD-10-CM | POA: Diagnosis not present

## 2017-04-10 DIAGNOSIS — M545 Low back pain: Secondary | ICD-10-CM | POA: Diagnosis not present

## 2017-04-12 DIAGNOSIS — N390 Urinary tract infection, site not specified: Secondary | ICD-10-CM | POA: Diagnosis not present

## 2017-04-12 DIAGNOSIS — E538 Deficiency of other specified B group vitamins: Secondary | ICD-10-CM | POA: Diagnosis not present

## 2017-04-12 DIAGNOSIS — Z0000 Encounter for general adult medical examination without abnormal findings: Secondary | ICD-10-CM | POA: Diagnosis not present

## 2017-04-12 DIAGNOSIS — I10 Essential (primary) hypertension: Secondary | ICD-10-CM | POA: Diagnosis not present

## 2017-04-13 DIAGNOSIS — M545 Low back pain: Secondary | ICD-10-CM | POA: Diagnosis not present

## 2017-04-13 DIAGNOSIS — H8113 Benign paroxysmal vertigo, bilateral: Secondary | ICD-10-CM | POA: Diagnosis not present

## 2017-04-13 DIAGNOSIS — R260 Ataxic gait: Secondary | ICD-10-CM | POA: Diagnosis not present

## 2017-04-13 DIAGNOSIS — M6281 Muscle weakness (generalized): Secondary | ICD-10-CM | POA: Diagnosis not present

## 2017-04-17 DIAGNOSIS — M545 Low back pain: Secondary | ICD-10-CM | POA: Diagnosis not present

## 2017-04-17 DIAGNOSIS — M6281 Muscle weakness (generalized): Secondary | ICD-10-CM | POA: Diagnosis not present

## 2017-04-17 DIAGNOSIS — H8113 Benign paroxysmal vertigo, bilateral: Secondary | ICD-10-CM | POA: Diagnosis not present

## 2017-04-17 DIAGNOSIS — R260 Ataxic gait: Secondary | ICD-10-CM | POA: Diagnosis not present

## 2017-04-18 DIAGNOSIS — Z0001 Encounter for general adult medical examination with abnormal findings: Secondary | ICD-10-CM | POA: Diagnosis not present

## 2017-04-18 DIAGNOSIS — E7800 Pure hypercholesterolemia, unspecified: Secondary | ICD-10-CM | POA: Diagnosis not present

## 2017-04-19 DIAGNOSIS — M6281 Muscle weakness (generalized): Secondary | ICD-10-CM | POA: Diagnosis not present

## 2017-04-19 DIAGNOSIS — M545 Low back pain: Secondary | ICD-10-CM | POA: Diagnosis not present

## 2017-04-19 DIAGNOSIS — H8113 Benign paroxysmal vertigo, bilateral: Secondary | ICD-10-CM | POA: Diagnosis not present

## 2017-04-19 DIAGNOSIS — R260 Ataxic gait: Secondary | ICD-10-CM | POA: Diagnosis not present

## 2017-04-24 DIAGNOSIS — H8113 Benign paroxysmal vertigo, bilateral: Secondary | ICD-10-CM | POA: Diagnosis not present

## 2017-04-24 DIAGNOSIS — R260 Ataxic gait: Secondary | ICD-10-CM | POA: Diagnosis not present

## 2017-04-24 DIAGNOSIS — M6281 Muscle weakness (generalized): Secondary | ICD-10-CM | POA: Diagnosis not present

## 2017-04-24 DIAGNOSIS — M545 Low back pain: Secondary | ICD-10-CM | POA: Diagnosis not present

## 2017-05-01 DIAGNOSIS — M6281 Muscle weakness (generalized): Secondary | ICD-10-CM | POA: Diagnosis not present

## 2017-05-01 DIAGNOSIS — M545 Low back pain: Secondary | ICD-10-CM | POA: Diagnosis not present

## 2017-05-01 DIAGNOSIS — H8113 Benign paroxysmal vertigo, bilateral: Secondary | ICD-10-CM | POA: Diagnosis not present

## 2017-05-01 DIAGNOSIS — R260 Ataxic gait: Secondary | ICD-10-CM | POA: Diagnosis not present

## 2017-05-04 DIAGNOSIS — H8113 Benign paroxysmal vertigo, bilateral: Secondary | ICD-10-CM | POA: Diagnosis not present

## 2017-05-04 DIAGNOSIS — M6281 Muscle weakness (generalized): Secondary | ICD-10-CM | POA: Diagnosis not present

## 2017-05-04 DIAGNOSIS — M545 Low back pain: Secondary | ICD-10-CM | POA: Diagnosis not present

## 2017-05-04 DIAGNOSIS — R260 Ataxic gait: Secondary | ICD-10-CM | POA: Diagnosis not present

## 2017-05-05 DIAGNOSIS — L570 Actinic keratosis: Secondary | ICD-10-CM | POA: Diagnosis not present

## 2017-05-05 DIAGNOSIS — D225 Melanocytic nevi of trunk: Secondary | ICD-10-CM | POA: Diagnosis not present

## 2017-05-05 DIAGNOSIS — X32XXXA Exposure to sunlight, initial encounter: Secondary | ICD-10-CM | POA: Diagnosis not present

## 2017-05-08 DIAGNOSIS — H8113 Benign paroxysmal vertigo, bilateral: Secondary | ICD-10-CM | POA: Diagnosis not present

## 2017-05-08 DIAGNOSIS — M6281 Muscle weakness (generalized): Secondary | ICD-10-CM | POA: Diagnosis not present

## 2017-05-08 DIAGNOSIS — M545 Low back pain: Secondary | ICD-10-CM | POA: Diagnosis not present

## 2017-05-08 DIAGNOSIS — R260 Ataxic gait: Secondary | ICD-10-CM | POA: Diagnosis not present

## 2017-05-11 DIAGNOSIS — R260 Ataxic gait: Secondary | ICD-10-CM | POA: Diagnosis not present

## 2017-05-11 DIAGNOSIS — M6281 Muscle weakness (generalized): Secondary | ICD-10-CM | POA: Diagnosis not present

## 2017-05-11 DIAGNOSIS — M545 Low back pain: Secondary | ICD-10-CM | POA: Diagnosis not present

## 2017-05-11 DIAGNOSIS — H8113 Benign paroxysmal vertigo, bilateral: Secondary | ICD-10-CM | POA: Diagnosis not present

## 2017-05-15 DIAGNOSIS — H8113 Benign paroxysmal vertigo, bilateral: Secondary | ICD-10-CM | POA: Diagnosis not present

## 2017-05-15 DIAGNOSIS — R260 Ataxic gait: Secondary | ICD-10-CM | POA: Diagnosis not present

## 2017-05-15 DIAGNOSIS — M6281 Muscle weakness (generalized): Secondary | ICD-10-CM | POA: Diagnosis not present

## 2017-05-15 DIAGNOSIS — M545 Low back pain: Secondary | ICD-10-CM | POA: Diagnosis not present

## 2017-05-17 DIAGNOSIS — M4726 Other spondylosis with radiculopathy, lumbar region: Secondary | ICD-10-CM | POA: Diagnosis not present

## 2017-05-17 DIAGNOSIS — M7918 Myalgia, other site: Secondary | ICD-10-CM | POA: Diagnosis not present

## 2017-05-18 ENCOUNTER — Other Ambulatory Visit: Payer: Self-pay | Admitting: Internal Medicine

## 2017-05-18 DIAGNOSIS — M4726 Other spondylosis with radiculopathy, lumbar region: Secondary | ICD-10-CM

## 2017-05-20 ENCOUNTER — Ambulatory Visit
Admission: RE | Admit: 2017-05-20 | Discharge: 2017-05-20 | Disposition: A | Discharge status: Home / Self Care | Payer: Medicare Other | Source: Ambulatory Visit | Attending: Internal Medicine | Admitting: Internal Medicine

## 2017-05-20 DIAGNOSIS — M48061 Spinal stenosis, lumbar region without neurogenic claudication: Secondary | ICD-10-CM | POA: Diagnosis not present

## 2017-05-20 DIAGNOSIS — M4726 Other spondylosis with radiculopathy, lumbar region: Secondary | ICD-10-CM

## 2017-05-25 DIAGNOSIS — I10 Essential (primary) hypertension: Secondary | ICD-10-CM | POA: Diagnosis not present

## 2017-05-25 DIAGNOSIS — M5416 Radiculopathy, lumbar region: Secondary | ICD-10-CM | POA: Diagnosis not present

## 2017-05-27 ENCOUNTER — Other Ambulatory Visit: Payer: Medicare Other

## 2017-06-01 DIAGNOSIS — H8113 Benign paroxysmal vertigo, bilateral: Secondary | ICD-10-CM | POA: Diagnosis not present

## 2017-06-01 DIAGNOSIS — M545 Low back pain: Secondary | ICD-10-CM | POA: Diagnosis not present

## 2017-06-01 DIAGNOSIS — R260 Ataxic gait: Secondary | ICD-10-CM | POA: Diagnosis not present

## 2017-06-01 DIAGNOSIS — M6281 Muscle weakness (generalized): Secondary | ICD-10-CM | POA: Diagnosis not present

## 2017-06-05 DIAGNOSIS — R260 Ataxic gait: Secondary | ICD-10-CM | POA: Diagnosis not present

## 2017-06-05 DIAGNOSIS — M6281 Muscle weakness (generalized): Secondary | ICD-10-CM | POA: Diagnosis not present

## 2017-06-05 DIAGNOSIS — H8113 Benign paroxysmal vertigo, bilateral: Secondary | ICD-10-CM | POA: Diagnosis not present

## 2017-06-05 DIAGNOSIS — M545 Low back pain: Secondary | ICD-10-CM | POA: Diagnosis not present

## 2017-06-07 DIAGNOSIS — M6281 Muscle weakness (generalized): Secondary | ICD-10-CM | POA: Diagnosis not present

## 2017-06-07 DIAGNOSIS — M545 Low back pain: Secondary | ICD-10-CM | POA: Diagnosis not present

## 2017-06-07 DIAGNOSIS — R260 Ataxic gait: Secondary | ICD-10-CM | POA: Diagnosis not present

## 2017-06-07 DIAGNOSIS — H8113 Benign paroxysmal vertigo, bilateral: Secondary | ICD-10-CM | POA: Diagnosis not present

## 2017-06-12 ENCOUNTER — Ambulatory Visit: Payer: Medicare Other | Admitting: Diagnostic Neuroimaging

## 2017-06-12 DIAGNOSIS — M6281 Muscle weakness (generalized): Secondary | ICD-10-CM | POA: Diagnosis not present

## 2017-06-12 DIAGNOSIS — H8113 Benign paroxysmal vertigo, bilateral: Secondary | ICD-10-CM | POA: Diagnosis not present

## 2017-06-12 DIAGNOSIS — R260 Ataxic gait: Secondary | ICD-10-CM | POA: Diagnosis not present

## 2017-06-12 DIAGNOSIS — M545 Low back pain: Secondary | ICD-10-CM | POA: Diagnosis not present

## 2017-06-14 DIAGNOSIS — H903 Sensorineural hearing loss, bilateral: Secondary | ICD-10-CM | POA: Diagnosis not present

## 2017-06-19 ENCOUNTER — Ambulatory Visit: Payer: Medicare Other | Admitting: Diagnostic Neuroimaging

## 2017-06-19 DIAGNOSIS — M545 Low back pain: Secondary | ICD-10-CM | POA: Diagnosis not present

## 2017-06-19 DIAGNOSIS — H8113 Benign paroxysmal vertigo, bilateral: Secondary | ICD-10-CM | POA: Diagnosis not present

## 2017-06-19 DIAGNOSIS — R260 Ataxic gait: Secondary | ICD-10-CM | POA: Diagnosis not present

## 2017-06-19 DIAGNOSIS — M6281 Muscle weakness (generalized): Secondary | ICD-10-CM | POA: Diagnosis not present

## 2017-06-21 DIAGNOSIS — M545 Low back pain: Secondary | ICD-10-CM | POA: Diagnosis not present

## 2017-06-21 DIAGNOSIS — R260 Ataxic gait: Secondary | ICD-10-CM | POA: Diagnosis not present

## 2017-06-21 DIAGNOSIS — H8113 Benign paroxysmal vertigo, bilateral: Secondary | ICD-10-CM | POA: Diagnosis not present

## 2017-06-21 DIAGNOSIS — M6281 Muscle weakness (generalized): Secondary | ICD-10-CM | POA: Diagnosis not present

## 2017-06-22 ENCOUNTER — Other Ambulatory Visit: Payer: Self-pay | Admitting: Cardiovascular Disease

## 2017-06-23 ENCOUNTER — Ambulatory Visit: Payer: Medicare Other | Admitting: Diagnostic Neuroimaging

## 2017-06-23 ENCOUNTER — Encounter: Payer: Self-pay | Admitting: Diagnostic Neuroimaging

## 2017-06-23 VITALS — BP 142/70 | HR 59 | Ht 59.5 in | Wt 129.6 lb

## 2017-06-23 DIAGNOSIS — G2581 Restless legs syndrome: Secondary | ICD-10-CM

## 2017-06-23 DIAGNOSIS — M5416 Radiculopathy, lumbar region: Secondary | ICD-10-CM | POA: Diagnosis not present

## 2017-06-23 DIAGNOSIS — G4489 Other headache syndrome: Secondary | ICD-10-CM

## 2017-06-23 DIAGNOSIS — R5383 Other fatigue: Secondary | ICD-10-CM | POA: Diagnosis not present

## 2017-06-23 NOTE — Progress Notes (Signed)
[facility]  PATIENT: Valerie Barker DOB: Mar 13, 1923  REFERRING CLINICIAN: Audie Pinto, MD HISTORY FROM: patient and son REASON FOR VISIT: new consult    HISTORICAL  CHIEF COMPLAINT:  Chief Complaint  Patient presents with  . NP  Dr. Shelia Media  . Restless Leg syndrome    has resolved now.  asking about fatigue and her medications that she is taking.  L hip pain, has had MRI.  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   . Fatigue  . L hip leg pain    since Christmas noticed pain,     HISTORY OF PRESENT ILLNESS:   84 year old female here for evaluation of restless leg syndrome, fatigue, low back pain, headaches.  Patient has history of restless leg syndrome for many years, managed on gabapentin and ropinirole.  Symptoms were poorly controlled a few months ago but have improved now.  Patient is satisfied with her current medication.  She has history of low B12 level.  Patient asking about generalized fatigue over the last 2-3 years.  Patient also has been widowed for last 3 years after her husband with dementia passed away.  She has decreased interest and activity to do on daily basis.  Patient also has low back pain rating to the right hip and leg since December 2018.  She has been going through physical therapy with mild relief.  She also uses tramadol and Tylenol with mild relief.  She had MRI of the lumbar spine which showed disc protrusion at L3-4 level towards the right side.  She is continuing to pursue conservative treatments.  Patient also having new onset of headache, mainly on the right side, for past 1 month.  Sometimes headache is quite severe and hits her all of a sudden.  At other times she has a dull sensation on the right side.  Sometimes she sees some abnormal vision sensations with these headaches.  Patient is concerned about these headaches as she has never had anything like this before in her life.   REVIEW OF SYSTEMS: Full 14 system review of systems performed and  negative with exception of: Fatigue eye pain constipation dry mouth decreased energy weakness sleepiness restless legs.  ALLERGIES: Allergies  Allergen Reactions  . Duloxetine Other (See Comments)    unknown  . Erythromycin   . Mirtazapine     Other reaction(s): Other (See Comments) unknown  . Tramadol     Other reaction(s): Other (See Comments) unknown  . Trazodone     Other reaction(s): Other (See Comments) unknown  . Zolpidem     Other reaction(s): Other (See Comments) unknown  . Cyclobenzaprine Anxiety    Unknown  . Diazepam Rash    unknown    HOME MEDICATIONS: Outpatient Medications Prior to Visit  Medication Sig Dispense Refill  . atorvastatin (LIPITOR) 10 MG tablet TAKE 1/2 TABLET EVERY DAY. 45 tablet 4  . Biotin 5000 MCG CAPS Take 1 capsule by mouth daily.    . Calcium Carb-Cholecalciferol (CALCIUM 600 + D) 600-200 MG-UNIT TABS Take 1 tablet by mouth 2 (two) times daily.    . Cholecalciferol (VITAMIN D) 2000 UNITS CAPS Take 1 capsule by mouth daily.    . clopidogrel (PLAVIX) 75 MG tablet Take 75 mg by mouth daily.    . Cyanocobalamin (VITAMIN B-12 PO) Take 2,500 mcg by mouth daily.    Marland Kitchen gabapentin (NEURONTIN) 100 MG capsule Take 1 capsule by mouth daily.    Marland Kitchen losartan-hydrochlorothiazide (HYZAAR) 100-25 MG tablet 1 tablet.    Marland Kitchen  metoprolol succinate (TOPROL-XL) 25 MG 24 hr tablet TAKE 1/2 TABLET DAILY. DO NOT TAKE IF HEART RATE IS LESS THAN 55. 15 tablet 5  . Multiple Vitamin (MULTIVITAMIN) capsule Take 1 capsule by mouth daily.    Marland Kitchen rOPINIRole (REQUIP) 0.5 MG tablet Take by mouth at bedtime. 2 tabs at bedtime, and takes 1/2 tab in afternoon prn    . traMADol (ULTRAM) 50 MG tablet Take by mouth every 6 (six) hours as needed.    Marland Kitchen guaiFENesin (MUCINEX) 600 MG 12 hr tablet Take 1,200 mg by mouth daily as needed for congestion.    Marland Kitchen losartan-hydrochlorothiazide (HYZAAR) 100-12.5 MG tablet Take 1 tablet by mouth daily. (Patient not taking: Reported on 06/23/2017) 90 tablet  3   No facility-administered medications prior to visit.     PAST MEDICAL HISTORY: Past Medical History:  Diagnosis Date  . Heart disease   . Hypertension   . IBS (irritable bowel syndrome)   . Stroke ([place])   . TIA (transient ischemic attack)     PAST SURGICAL HISTORY: Past Surgical History:  Procedure Laterality Date  . ABDOMINAL HYSTERECTOMY  1978  . BUNIONECTOMY  2006, 1996  . LAPAROSCOPIC SALPINGO OOPHERECTOMY  1988    FAMILY HISTORY: Family History  Problem Relation Age of Onset  . COPD Mother   . Schizophrenia Brother   . Stroke Father     SOCIAL HISTORY:  Social History   Socioeconomic History  . Marital status: Married    Spouse name: Not on file  . Number of children: Not on file  . Years of education: Not on file  . Highest education level: Not on file  Occupational History  . Not on file  Social Needs  . Financial resource strain: Not on file  . Food insecurity:    Worry: Not on file    Inability: Not on file  . Transportation needs:    Medical: Not on file    Non-medical: Not on file  Tobacco Use  . Smoking status: Never Smoker  . Smokeless tobacco: Never Used  Substance and Sexual Activity  . Alcohol use: No  . Drug use: No  . Sexual activity: Not on file  Lifestyle  . Physical activity:    Days per week: Not on file    Minutes per session: Not on file  . Stress: Not on file  Relationships  . Social connections:    Talks on phone: Not on file    Gets together: Not on file    Attends religious service: Not on file    Active member of club or organization: Not on file    Attends meetings of clubs or organizations: Not on file    Relationship status: Not on file  . Intimate partner violence:    Fear of current or ex partner: Not on file    Emotionally abused: Not on file    Physically abused: Not on file    Forced sexual activity: Not on file  Other Topics Concern  . Not on file  Social History Narrative   Lives in [place] at [place].  Education BA degree.  Retired.  3 children.  Son, Yampa.  Caffeine 1=2 cups daily.     PHYSICAL EXAM  GENERAL EXAM/CONSTITUTIONAL: Vitals:  Vitals:   06/23/17 1015  BP: (!) 142/70  Pulse: (!) 59  Weight: 129 lb 9.6 oz (58.8 kg)  Height: 4' 11.5" (1.511 m)     Body mass index is 25.74 kg/m.  No exam data present  Patient is in no distress; well developed, nourished and groomed; neck is supple  CARDIOVASCULAR:  Examination of carotid arteries is normal; no carotid bruits  Regular rate and rhythm, no murmurs  Examination of peripheral vascular system by observation and palpation is normal  EYES:  Ophthalmoscopic exam of optic discs and posterior segments is normal; no papilledema or hemorrhages  MUSCULOSKELETAL:  Gait, strength, tone, movements noted in Neurologic exam below  NEUROLOGIC: MENTAL STATUS:  No flowsheet data found.  awake, alert, oriented to person, place and time  recent and remote memory intact  normal attention and concentration  language fluent, comprehension intact, naming intact,   fund of knowledge appropriate  CRANIAL NERVE:   2nd - no papilledema on fundoscopic exam  2nd, 3rd, 4th, 6th - pupils equal and reactive to light, visual fields full to confrontation, extraocular muscles intact, no nystagmus  5th - facial sensation symmetric  7th - facial strength symmetric  8th - hearing intact  9th - palate elevates symmetrically, uvula midline  11th - shoulder shrug symmetric  12th - tongue protrusion midline  MOTOR:   normal bulk and tone, full strength in the BUE, BLE  SENSORY:   normal and symmetric to light touch, temperature, vibration; DECR VIBRATION AT ANKLES AND TOES  COORDINATION:   finger-nose-finger, fine finger movements normal  REFLEXES:   deep tendon reflexes TRACE and symmetric  GAIT/STATION:   narrow based gait; SLOW CAUTIOUS GAIT    DIAGNOSTIC DATA (LABS, IMAGING,  TESTING) - I reviewed patient records, labs, notes, testing and imaging myself where available.  Lab Results  Component Value Date   WBC 5.8 01/28/2013   HGB 13.4 01/28/2013   HCT 39.1 01/28/2013   MCV 89.7 01/28/2013   PLT 170 01/28/2013      Component Value Date/Time   NA 139 01/28/2013 1319   K 4.1 01/28/2013 1319   CL 102 01/28/2013 1319   CO2 26 01/28/2013 1319   GLUCOSE 118 (H) 01/28/2013 1319   BUN 17 01/28/2013 1319   CREATININE 0.87 01/28/2013 2000   CALCIUM 10.0 01/28/2013 1319   PROT 7.4 01/28/2013 1319   ALBUMIN 4.3 01/28/2013 1319   AST 22 01/28/2013 1319   ALT 20 01/28/2013 1319   ALKPHOS 73 01/28/2013 1319   BILITOT 0.4 01/28/2013 1319   GFRNONAA 58 (L) 01/28/2013 2000   GFRAA 67 (L) 01/28/2013 2000   Lab Results  Component Value Date   CHOL 118 01/29/2013   HDL 66 01/29/2013   LDLCALC 39 01/29/2013   TRIG 66 01/29/2013   CHOLHDL 1.8 01/29/2013   Lab Results  Component Value Date   HGBA1C 5.9 (H) 01/28/2013   Lab Results  Component Value Date   VITAMINB12 164 (L) 04/29/2009   Lab Results  Component Value Date   TSH 2.19 10/09/2015    01/28/13 MRI HEAD   - No acute intracranial findings are evident. There is advanced atrophy with chronic microvascular ischemic change.  01/28/13 MRA HEAD  - No intracranial flow reducing lesion is evident.  05/20/17 MRI lumbar spine [I reviewed images myself and agree with interpretation. -VRP] - Moderately large right-sided disc protrusion L3-4 with impingement of the right L3 nerve root in the subarticular zone. Mild spinal stenosis. - Grade 1 anterolisthesis L4-5 with mild spinal stenosis.    ASSESSMENT AND PLAN  84 y.o. year old female here with:   Dx:  1. RLS (restless legs syndrome)   2. Other headache syndrome  3. Right lumbar radiculopathy   4. Other fatigue      PLAN:  RESTLESS LEGS - continue ropinirole and gabapentin - optimize B12, iron levels  FATIGUE - optimize nutrition,  exercise, sleep, routine  RIGHT LOWER BACK PAIN / HIP PAIN - continue conservative mgmt / PT  NEW ONSET HEADACHE / VISION CHANGES - check MRI brain  Orders Placed This Encounter  Procedures  . MR BRAIN WO CONTRAST   Return pending test results, for return to PCP.    Penni Bombard, MD 9/38/1017, 51:02 AM Certified in Neurology, Neurophysiology and Neuroimaging  [facility] [address] [place], [place] [address] [phone]

## 2017-06-23 NOTE — Patient Instructions (Signed)
RESTLESS LEGS - continue ropinirole and gabapentin - follow up B12, iron levels with Dr. Shelia Media  FATIGUE - improve nutrition, exercise, sleep, activities routine  RIGHT LOWER BACK PAIN / HIP PAIN - continue conservative mgmt / PT  NEW ONSET HEADACHE / VISION CHANGES - check MRI brain

## 2017-06-26 DIAGNOSIS — M545 Low back pain: Secondary | ICD-10-CM | POA: Diagnosis not present

## 2017-06-26 DIAGNOSIS — M6281 Muscle weakness (generalized): Secondary | ICD-10-CM | POA: Diagnosis not present

## 2017-06-26 DIAGNOSIS — H8113 Benign paroxysmal vertigo, bilateral: Secondary | ICD-10-CM | POA: Diagnosis not present

## 2017-06-26 DIAGNOSIS — R260 Ataxic gait: Secondary | ICD-10-CM | POA: Diagnosis not present

## 2017-06-28 DIAGNOSIS — R260 Ataxic gait: Secondary | ICD-10-CM | POA: Diagnosis not present

## 2017-06-28 DIAGNOSIS — M545 Low back pain: Secondary | ICD-10-CM | POA: Diagnosis not present

## 2017-06-28 DIAGNOSIS — H8113 Benign paroxysmal vertigo, bilateral: Secondary | ICD-10-CM | POA: Diagnosis not present

## 2017-06-28 DIAGNOSIS — M6281 Muscle weakness (generalized): Secondary | ICD-10-CM | POA: Diagnosis not present

## 2017-07-05 ENCOUNTER — Ambulatory Visit
Admission: RE | Admit: 2017-07-05 | Discharge: 2017-07-05 | Disposition: A | Discharge status: Home / Self Care | Payer: Medicare Other | Source: Ambulatory Visit | Attending: Diagnostic Neuroimaging | Admitting: Diagnostic Neuroimaging

## 2017-07-05 DIAGNOSIS — G4489 Other headache syndrome: Secondary | ICD-10-CM | POA: Diagnosis not present

## 2017-07-05 DIAGNOSIS — R51 Headache: Secondary | ICD-10-CM | POA: Diagnosis not present

## 2017-07-07 ENCOUNTER — Telehealth: Payer: Self-pay | Admitting: *Deleted

## 2017-07-07 NOTE — Telephone Encounter (Signed)
No answer will call next week.

## 2017-07-07 NOTE — Telephone Encounter (Signed)
-----   Message from Penni Bombard, MD sent at 07/06/2017  6:32 PM EDT ----- Unremarkable imaging results. No change from prior scan. Please call patient. Continue current plan. -VRP

## 2017-07-10 DIAGNOSIS — M545 Low back pain: Secondary | ICD-10-CM | POA: Diagnosis not present

## 2017-07-10 DIAGNOSIS — R260 Ataxic gait: Secondary | ICD-10-CM | POA: Diagnosis not present

## 2017-07-10 DIAGNOSIS — M6281 Muscle weakness (generalized): Secondary | ICD-10-CM | POA: Diagnosis not present

## 2017-07-10 DIAGNOSIS — H8113 Benign paroxysmal vertigo, bilateral: Secondary | ICD-10-CM | POA: Diagnosis not present

## 2017-07-11 NOTE — Telephone Encounter (Signed)
Spoke to pt and relayed that her MRI was unremarkable, no change from previous MRI.  Continue current plan.  Pt verbalized understanding and she stated she felt very blessed.  Wanted copy forwarded to Dr. Shelia Media.  I told her that will send if can thru Epic or if not will fax copy.  She was appreciative.

## 2017-07-12 DIAGNOSIS — R260 Ataxic gait: Secondary | ICD-10-CM | POA: Diagnosis not present

## 2017-07-12 DIAGNOSIS — H8113 Benign paroxysmal vertigo, bilateral: Secondary | ICD-10-CM | POA: Diagnosis not present

## 2017-07-12 DIAGNOSIS — M6281 Muscle weakness (generalized): Secondary | ICD-10-CM | POA: Diagnosis not present

## 2017-07-12 DIAGNOSIS — M545 Low back pain: Secondary | ICD-10-CM | POA: Diagnosis not present

## 2017-07-17 DIAGNOSIS — G8929 Other chronic pain: Secondary | ICD-10-CM | POA: Diagnosis not present

## 2017-07-17 DIAGNOSIS — M5440 Lumbago with sciatica, unspecified side: Secondary | ICD-10-CM | POA: Diagnosis not present

## 2017-07-20 ENCOUNTER — Other Ambulatory Visit: Payer: Self-pay | Admitting: Cardiovascular Disease

## 2017-07-31 DIAGNOSIS — M6281 Muscle weakness (generalized): Secondary | ICD-10-CM | POA: Diagnosis not present

## 2017-07-31 DIAGNOSIS — R260 Ataxic gait: Secondary | ICD-10-CM | POA: Diagnosis not present

## 2017-07-31 DIAGNOSIS — R531 Weakness: Secondary | ICD-10-CM | POA: Diagnosis not present

## 2017-07-31 DIAGNOSIS — M545 Low back pain: Secondary | ICD-10-CM | POA: Diagnosis not present

## 2017-07-31 DIAGNOSIS — H8113 Benign paroxysmal vertigo, bilateral: Secondary | ICD-10-CM | POA: Diagnosis not present

## 2017-08-01 ENCOUNTER — Emergency Department (HOSPITAL_COMMUNITY): Payer: Medicare Other

## 2017-08-01 ENCOUNTER — Encounter (HOSPITAL_COMMUNITY): Payer: Self-pay

## 2017-08-01 ENCOUNTER — Emergency Department (HOSPITAL_COMMUNITY)
Admission: EM | Admit: 2017-08-01 | Discharge: 2017-08-01 | Disposition: A | Discharge status: Home / Self Care | Payer: Medicare Other | Attending: Emergency Medicine | Admitting: Emergency Medicine

## 2017-08-01 DIAGNOSIS — Z7902 Long term (current) use of antithrombotics/antiplatelets: Secondary | ICD-10-CM | POA: Insufficient documentation

## 2017-08-01 DIAGNOSIS — W1812XA Fall from or off toilet with subsequent striking against object, initial encounter: Secondary | ICD-10-CM | POA: Insufficient documentation

## 2017-08-01 DIAGNOSIS — I10 Essential (primary) hypertension: Secondary | ICD-10-CM | POA: Diagnosis not present

## 2017-08-01 DIAGNOSIS — R42 Dizziness and giddiness: Secondary | ICD-10-CM | POA: Diagnosis not present

## 2017-08-01 DIAGNOSIS — S0990XA Unspecified injury of head, initial encounter: Secondary | ICD-10-CM | POA: Insufficient documentation

## 2017-08-01 DIAGNOSIS — R404 Transient alteration of awareness: Secondary | ICD-10-CM | POA: Diagnosis not present

## 2017-08-01 DIAGNOSIS — Z79899 Other long term (current) drug therapy: Secondary | ICD-10-CM | POA: Insufficient documentation

## 2017-08-01 DIAGNOSIS — Y92012 Bathroom of single-family (private) house as the place of occurrence of the external cause: Secondary | ICD-10-CM | POA: Insufficient documentation

## 2017-08-01 DIAGNOSIS — Y9389 Activity, other specified: Secondary | ICD-10-CM | POA: Insufficient documentation

## 2017-08-01 DIAGNOSIS — Y999 Unspecified external cause status: Secondary | ICD-10-CM | POA: Diagnosis not present

## 2017-08-01 DIAGNOSIS — W19XXXA Unspecified fall, initial encounter: Secondary | ICD-10-CM

## 2017-08-01 DIAGNOSIS — S199XXA Unspecified injury of neck, initial encounter: Secondary | ICD-10-CM | POA: Diagnosis not present

## 2017-08-01 NOTE — ED Notes (Signed)
PT states understanding of care given, follow up care. PT ambulated from ED to car with a steady gait.  

## 2017-08-01 NOTE — ED Provider Notes (Signed)
[facility] Provider Note   CSN: [id number] Arrival date & time: 08/01/17  0117     History   Chief Complaint Chief Complaint  Patient presents with  . Fall  . Dizziness    HPI Valerie Barker is a 84 y.o. female.  Patient presents to the ED with a chief complaint of fall.  Patient states that she was getting off of the toilet this evening and was trying to get some more toilet paper and bent over to get into the cabinet, when she stood up she tipped over backward and hit her head.  She takes Plavix.  She was advised to come to the emergency department for evaluation.  She reports having some associated nausea.  She denies any chest pain or shortness of breath.  She denies any other associated symptoms.  She was able to ambulate after the incident.  The history is provided by the patient. No language interpreter was used.    Past Medical History:  Diagnosis Date  . Heart disease   . Hypertension   . IBS (irritable bowel syndrome)   . Stroke ([place])   . TIA (transient ischemic attack)     Patient Active Problem List   Diagnosis Date Noted  . Restless leg syndrome 08/26/2015  . Hyperlipidemia 08/26/2015  . Essential hypertension 02/06/2015  . Dizziness 02/06/2015  . Sinus bradycardia 02/06/2015  . Lower extremity edema 08/05/2014  . Short-term memory loss 02/01/2014  . Palpitation 06/03/2013  . TIA (transient ischemic attack) 01/28/2013  . Dyslipidemia 01/28/2013  . HTN (hypertension) 01/28/2013  . Arm numbness 01/28/2013    Past Surgical History:  Procedure Laterality Date  . ABDOMINAL HYSTERECTOMY  1978  . BUNIONECTOMY  2006, 1996  . LAPAROSCOPIC SALPINGO OOPHERECTOMY  1988     OB History   None      Home Medications    Prior to Admission medications   Medication Sig Start Date End Date Taking? Authorizing Provider  atorvastatin (LIPITOR) 10 MG tablet TAKE 1/2 TABLET EVERY DAY. 06/22/17   Troy Sine, MD    Biotin 5000 MCG CAPS Take 1 capsule by mouth daily.    [provider]  Calcium Carb-Cholecalciferol (CALCIUM 600 + D) 600-200 MG-UNIT TABS Take 1 tablet by mouth 2 (two) times daily.    [provider]  Cholecalciferol (VITAMIN D) 2000 UNITS CAPS Take 1 capsule by mouth daily.    [provider]  clopidogrel (PLAVIX) 75 MG tablet Take 75 mg by mouth daily.    [provider]  Cyanocobalamin (VITAMIN B-12 PO) Take 2,500 mcg by mouth daily.    [provider]  gabapentin (NEURONTIN) 100 MG capsule Take 1 capsule by mouth daily. 12/30/13   [provider]  losartan-hydrochlorothiazide (HYZAAR) 100-25 MG tablet 1 tablet.    [provider]  metoprolol succinate (TOPROL-XL) 25 MG 24 hr tablet TAKE 1/2 TABLET DAILY. DO NOT TAKE IF HEART RATE IS LESS THAN 55. 07/20/17   Troy Sine, MD  Multiple Vitamin (MULTIVITAMIN) capsule Take 1 capsule by mouth daily.    [provider]  rOPINIRole (REQUIP) 0.5 MG tablet Take by mouth at bedtime. 2 tabs at bedtime, and takes 1/2 tab in afternoon prn 07/04/14   [provider]  traMADol (ULTRAM) 50 MG tablet Take by mouth every 6 (six) hours as needed.    [provider]    Family History Family History  Problem Relation Age of Onset  .  COPD Mother   . Schizophrenia Brother   . Stroke Father     Social History Social History   Tobacco Use  . Smoking status: Never Smoker  . Smokeless tobacco: Never Used  Substance Use Topics  . Alcohol use: No  . Drug use: No     Allergies   Duloxetine; Erythromycin; Mirtazapine; Tramadol; Trazodone; Zolpidem; Cyclobenzaprine; and Diazepam   Review of Systems Review of Systems  All other systems reviewed and are negative.    Physical Exam Updated Vital Signs BP (!) 107/41   Pulse (!) 52   Temp 98 F (36.7 C) (Oral)   Resp 16   Ht 4' 11.25" (1.505 m)   Wt 55.3 kg (122 lb)   SpO2 95%   BMI 24.43 kg/m    Physical Exam  Constitutional: She is oriented to person, place, and time. She appears well-developed and well-nourished.  HENT:  Head: Normocephalic and atraumatic.  No evidence of traumatic head injury  Eyes: Pupils are equal, round, and reactive to light. Conjunctivae and EOM are normal.  Neck: Normal range of motion. Neck supple.  Cardiovascular: Normal rate and regular rhythm. Exam reveals no gallop and no friction rub.  No murmur heard. Pulmonary/Chest: Effort normal and breath sounds normal. No respiratory distress. She has no wheezes. She has no rales. She exhibits no tenderness.  Abdominal: Soft. Bowel sounds are normal. She exhibits no distension and no mass. There is no tenderness. There is no rebound and no guarding.  Musculoskeletal: Normal range of motion. She exhibits no edema or tenderness.  ambulates  Neurological: She is alert and oriented to person, place, and time.  Skin: Skin is warm and dry.  Psychiatric: She has a normal mood and affect. Her behavior is normal. Judgment and thought content normal.  Nursing note and vitals reviewed.    ED Treatments / Results  Labs (all labs ordered are listed, but only abnormal results are displayed) Labs Reviewed - No data to display  EKG None  Radiology Ct Head Wo Contrast  Result Date: 08/01/2017 CLINICAL DATA:  [place] earlier today. No loss of consciousness. Dizziness. EXAM: CT HEAD WITHOUT CONTRAST CT CERVICAL SPINE WITHOUT CONTRAST TECHNIQUE: Multidetector CT imaging of the head and cervical spine was performed following the standard protocol without intravenous contrast. Multiplanar CT image reconstructions of the cervical spine were also generated. COMPARISON:  MRI brain 07/05/2017.  CT head 01/28/2013 FINDINGS: CT HEAD FINDINGS Brain: Diffuse cerebral atrophy. Ventricular dilatation consistent with central atrophy. Low-attenuation changes throughout the deep white matter consistent with small vessel ischemic change. No  mass effect or midline shift. No abnormal extra-axial fluid collections. Gray-white matter junctions are distinct. Basal cisterns are not effaced. No acute intracranial hemorrhage. Vascular: Diffusion intracranial arterial vascular calcifications are present. Skull: Normal. Negative for fracture or focal lesion. Sinuses/Orbits: No acute finding. Other: None. CT CERVICAL SPINE FINDINGS Alignment: Normal. Skull base and vertebrae: No acute fracture. No primary bone lesion or focal pathologic process. Soft tissues and spinal canal: No prevertebral fluid or swelling. No visible canal hematoma. Disc levels: Mild endplate hypertrophic changes at C3-4, C4-5, and C6-7 levels. Disc space heights are preserved. Degenerative changes at C1 to with ligamentous calcifications. Upper chest: Lung apices are clear. Other: None. IMPRESSION: 1. No acute intracranial abnormalities. Chronic atrophy and small vessel ischemic changes. 2. Normal alignment of the cervical spine. Mild degenerative changes. No acute displaced fractures identified. Electronically Signed   By: Lucienne Capers M.D.   On: 08/01/2017 05:11  Ct Cervical Spine Wo Contrast  Result Date: 08/01/2017 CLINICAL DATA:  [place] earlier today. No loss of consciousness. Dizziness. EXAM: CT HEAD WITHOUT CONTRAST CT CERVICAL SPINE WITHOUT CONTRAST TECHNIQUE: Multidetector CT imaging of the head and cervical spine was performed following the standard protocol without intravenous contrast. Multiplanar CT image reconstructions of the cervical spine were also generated. COMPARISON:  MRI brain 07/05/2017.  CT head 01/28/2013 FINDINGS: CT HEAD FINDINGS Brain: Diffuse cerebral atrophy. Ventricular dilatation consistent with central atrophy. Low-attenuation changes throughout the deep white matter consistent with small vessel ischemic change. No mass effect or midline shift. No abnormal extra-axial fluid collections. Gray-white matter junctions are distinct. Basal cisterns are not  effaced. No acute intracranial hemorrhage. Vascular: Diffusion intracranial arterial vascular calcifications are present. Skull: Normal. Negative for fracture or focal lesion. Sinuses/Orbits: No acute finding. Other: None. CT CERVICAL SPINE FINDINGS Alignment: Normal. Skull base and vertebrae: No acute fracture. No primary bone lesion or focal pathologic process. Soft tissues and spinal canal: No prevertebral fluid or swelling. No visible canal hematoma. Disc levels: Mild endplate hypertrophic changes at C3-4, C4-5, and C6-7 levels. Disc space heights are preserved. Degenerative changes at C1 to with ligamentous calcifications. Upper chest: Lung apices are clear. Other: None. IMPRESSION: 1. No acute intracranial abnormalities. Chronic atrophy and small vessel ischemic changes. 2. Normal alignment of the cervical spine. Mild degenerative changes. No acute displaced fractures identified. Electronically Signed   By: Lucienne Capers M.D.   On: 08/01/2017 05:11    Procedures Procedures (including critical care time)  Medications Ordered in ED Medications - No data to display   Initial Impression / Assessment and Plan / ED Course  I have reviewed the triage vital signs and the nursing notes.  Pertinent labs & imaging results that were available during my care of the patient were reviewed by me and considered in my medical decision making (see chart for details).     Patient with mechanical fall tonight.  Takes plavix.  CT head and cervical spine show no acute intracranial abnormalities.  Ambulates.    Seen by and discussed with Dr. Christy Gentles, who agrees with the plan for discharge.  Final Clinical Impressions(s) / ED Diagnoses   Final diagnoses:  Fall, initial encounter    ED Discharge Orders    None       Montine Circle, PA-C 08/01/17 0541    Ripley Fraise, MD 08/01/17 [phone]

## 2017-08-01 NOTE — ED Triage Notes (Signed)
Pt has a fall earlier today did bump her head but denise LOC.  pt states that she is feeling dizzy. Pt axox4. Ambulatory on scene.

## 2017-08-02 ENCOUNTER — Telehealth: Payer: Self-pay | Admitting: Cardiovascular Disease

## 2017-08-02 NOTE — Telephone Encounter (Signed)
Patient called into state that yesterday that she felt dizzy and fell. She was transferred to the ED at [facility] due to her feeling nauseous and having a knot of her head where she fell. No changes were made.  The patient has an appointment with Dr. Claiborne Billings on 08/07/17. She would like for Dr. Claiborne Billings to be made aware of what has happened. Message will be routed to the provider for his knowledge.

## 2017-08-02 NOTE — Telephone Encounter (Signed)
New message   Patient calling to report she went to ED 4/30, patient requesting Dr Claiborne Billings review her records from [facility].

## 2017-08-07 ENCOUNTER — Encounter: Payer: Self-pay | Admitting: Cardiovascular Disease

## 2017-08-07 ENCOUNTER — Ambulatory Visit: Payer: Medicare Other | Admitting: Cardiovascular Disease

## 2017-08-07 VITALS — BP 134/68 | HR 61 | Ht 59.25 in | Wt 124.6 lb

## 2017-08-07 DIAGNOSIS — I10 Essential (primary) hypertension: Secondary | ICD-10-CM

## 2017-08-07 DIAGNOSIS — R42 Dizziness and giddiness: Secondary | ICD-10-CM

## 2017-08-07 DIAGNOSIS — E7800 Pure hypercholesterolemia, unspecified: Secondary | ICD-10-CM | POA: Diagnosis not present

## 2017-08-07 DIAGNOSIS — Z8673 Personal history of transient ischemic attack (TIA), and cerebral infarction without residual deficits: Secondary | ICD-10-CM | POA: Diagnosis not present

## 2017-08-07 DIAGNOSIS — G2581 Restless legs syndrome: Secondary | ICD-10-CM | POA: Diagnosis not present

## 2017-08-07 NOTE — Progress Notes (Signed)
Patient ID: Valerie Barker, female   DOB: 1922-05-23, 84 y.o.   MRN: [id number]     HPI: Valerie Barker is a 84 y.o. female who presents to the office for an 8 month cardiology followup evaluation.  Valerie Barker is the wife of my former patient Dr. Buckner Malta who died on 03/21/15.  Remotely, Valerie Barker suffered a TIA and has documented old asymptomatic lacunar infarcts. Prior to undergoing urologic surgery at [facility] in 2012 a nuclear perfusion study showed normal perfusion. An echo Doppler study demonstrated mild to moderate TR, trace MR, and mild aortic sclerosis without stenosis. She had normal systolic function. Carotid studies done in 2011 were normal.  She was  hospitalized for 2 nights after experiencing an TIA on 01/08/2013. Her symptoms resolved spontaneously. A CT of her head showed mild atrophy with extensive supratentorial small vessel disease without intracranial mass, hemorrhage or acute appearing infarction. An MRI of her brain did not show any acute intracranial findings but did show advanced atrophy with chronic microvascular ischemic changes. MRA did not reveal any intracranial flow reducing lesions. During that hospitalization a f/u echo Doppler study  on 01/29/2013 showed an ejection fraction at 65%. There was mild aortic sclerosis without stenosis. Carotid Doppler study showed minimal plaque with less than 39% reduction. She  was sent home on atorvastatin 10 mg Plavix 75 mg in addition to her valsartan HCT 320/12.5 and zolpidem.  She has had issues with swelling of legs and feet.  She also has continued issues with restless legs.  She has been taking requip 0.5 mg shortly before going to bed, but it takes a while for the medication to work and usually does not completely suffice.  At times she has taken a half a pill if she is to have a daytime nap.  When I last saw her, she was complaining of experiencing a "bobble" sensation.  She underwent carotid duplex imaging which was  normal in November 2016.  She denies any episodes of chest pain.  She is unaware of palpitations.  She admits to being fatigued , but actually feels improved from previously.  At times if she is on her back for certain duration.  She does note transient right arm paresthesias.  She denies associated chest pressure.  She presents for evaluation.  She underwent extensive dental work with her dentist, Chief Financial Officer, and endodontist.  She also had eyelid lift surgery at  [facility] in December 2017 and tolerated this well from a cardiovascular standpoint.  When I last saw she felt well was experiencing significant tiredness and fatigue.  At times she noted  some dizziness with movement.  She has restless legs for which she is on requip in addition to gabapentin.  She denied chest pain.  Was unaware of palpitations.  Last week, as she was getting off the toilet and was trying to get or toilet paper she bent over into the cabinet and when she stood up she became dizzy lightheaded fell backwards and hit her head.  Since she was on chronic Plavix therapy she presented to the emergency room for evaluation.  No acute abnormality was noted.  However, her pulse in the emergency room was 52 and blood pressure 107/41.  She has been on losartan HCT 100/25 mg, Toprol-XL 12.5 mg and this has been held if her heart rate is less than 55.  She has rec with intake 2 tablets at bedtime and one half in the afternoon and has  issues with low back pain for which she takes as needed tramadol.  She continues to be on atorvastatin 10 mg for mild hyperlipidemia.  She presents for evaluation following her ER evaluation.  Past Medical History:  Diagnosis Date  . Heart disease   . Hypertension   . IBS (irritable bowel syndrome)   . Stroke ([place])   . TIA (transient ischemic attack)     Past Surgical History:  Procedure Laterality Date  . ABDOMINAL HYSTERECTOMY  1978  . BUNIONECTOMY  2006, 1996  . LAPAROSCOPIC SALPINGO  OOPHERECTOMY  1988    Allergies  Allergen Reactions  . Duloxetine Other (See Comments)    unknown  . Erythromycin   . Mirtazapine     Other reaction(s): Other (See Comments) unknown  . Trazodone     Other reaction(s): Other (See Comments) unknown  . Zolpidem     Other reaction(s): Other (See Comments) unknown  . Cyclobenzaprine Anxiety    Unknown  . Diazepam Rash    unknown    Current Outpatient Medications  Medication Sig Dispense Refill  . atorvastatin (LIPITOR) 10 MG tablet TAKE 1/2 TABLET EVERY DAY. 45 tablet 4  . Biotin 5000 MCG CAPS Take 1 capsule by mouth daily.    . Calcium Carb-Cholecalciferol (CALCIUM 600 + D) 600-200 MG-UNIT TABS Take 1 tablet by mouth 2 (two) times daily.    . Cholecalciferol (VITAMIN D) 2000 UNITS CAPS Take 1 capsule by mouth daily.    . clopidogrel (PLAVIX) 75 MG tablet Take 75 mg by mouth daily.    . Cyanocobalamin (VITAMIN B-12 PO) Take 2,500 mcg by mouth daily.    Marland Kitchen gabapentin (NEURONTIN) 100 MG capsule Take 1 capsule by mouth daily.    Marland Kitchen losartan-hydrochlorothiazide (HYZAAR) 100-25 MG tablet 1 tablet.    . Multiple Vitamin (MULTIVITAMIN) capsule Take 1 capsule by mouth daily.    Marland Kitchen rOPINIRole (REQUIP) 0.5 MG tablet Take by mouth at bedtime. 2 tabs at bedtime, and takes 1/2 tab in afternoon prn    . traMADol (ULTRAM) 50 MG tablet Take by mouth every 6 (six) hours as needed.     No current facility-administered medications for this visit.     Social History   Socioeconomic History  . Marital status: Married    Spouse name: Not on file  . Number of children: Not on file  . Years of education: Not on file  . Highest education level: Not on file  Occupational History  . Not on file  Social Needs  . Financial resource strain: Not on file  . Food insecurity:    Worry: Not on file    Inability: Not on file  . Transportation needs:    Medical: Not on file    Non-medical: Not on file  Tobacco Use  . Smoking status: Never Smoker  .  Smokeless tobacco: Never Used  Substance and Sexual Activity  . Alcohol use: No  . Drug use: No  . Sexual activity: Not on file  Lifestyle  . Physical activity:    Days per week: Not on file    Minutes per session: Not on file  . Stress: Not on file  Relationships  . Social connections:    Talks on phone: Not on file    Gets together: Not on file    Attends religious service: Not on file    Active member of club or organization: Not on file    Attends meetings of clubs or organizations: Not on file  Relationship status: Not on file  . Intimate partner violence:    Fear of current or ex partner: Not on file    Emotionally abused: Not on file    Physically abused: Not on file    Forced sexual activity: Not on file  Other Topics Concern  . Not on file  Social History Narrative   Lives in [place] at [place].  Education BA degree.  Retired.  3 children.  Son, [place]y.  Caffeine 1=2 cups daily.   Socially she is widowed as of December 2016.  Her husband was a former Scientist, physiological of education that [employer] and developed progressive dementia Prior to his death  Family History  Problem Relation Age of Onset  . COPD Mother   . Schizophrenia Brother   . Stroke Father    ROS General: Negative; No fevers, chills, or night sweats;  HEENT: Negative; No changes in vision or hearing, sinus congestion, difficulty swallowing Pulmonary: Negative; No cough, wheezing, shortness of breath, hemoptysis Cardiovascular: Negative; No chest pain, presyncope, syncope, palpitations Positive for leg swelling  GI: Negative; No nausea, vomiting, diarrhea, or abdominal pain GU: Positive for recurrent urinary incontinence despite her prior urologic surgery; No dysuria, hematuria, or difficulty voiding Musculoskeletal: Negative; no myalgias, joint pain, or weakness Hematologic/Oncology: Negative; no easy bruising, bleeding Endocrine: Negative; no heat/cold intolerance; no diabetes Neuro:  Positive for decreased short-term memory and history of TIA; Episode of right arm numbness in July 2017; no changes in balance, headaches Skin: Negative; No rashes or skin lesions Psychiatric: Negative; No behavioral problems, depression Sleep: Positive for restless leg syndrome; No snoring, daytime sleepiness, hypersomnolence, bruxism, hypnogognic hallucinations, no cataplexy Other comprehensive 14 point system review is negative.   PE BP 134/68   Pulse 61   Ht 4' 11.25" (1.505 m)   Wt 124 lb 9.6 oz (56.5 kg)   BMI 24.95 kg/m    Repeat blood pressure by me was 122/64 supine and 120/62 standing.  Wt Readings from Last 3 Encounters:  08/07/17 124 lb 9.6 oz (56.5 kg)  08/01/17 122 lb (55.3 kg)  06/23/17 129 lb 9.6 oz (58.8 kg)   General: Alert, oriented, no distress.  Skin: normal turgor, no rashes, warm and dry HEENT: Normocephalic, atraumatic. Pupils equal round and reactive to light; sclera anicteric; extraocular muscles intact;  Nose without nasal septal hypertrophy Mouth/Parynx benign; Mallinpatti scale 3 Neck: No JVD, no carotid bruits; normal carotid upstroke Lungs: clear to ausculatation and percussion; no wheezing or rales Chest wall: without tenderness to palpitation Heart: PMI not displaced, RRR, s1 s2 normal, 1/6 systolic murmur, no diastolic murmur, no rubs, gallops, thrills, or heaves Abdomen: soft, nontender; no hepatosplenomehaly, BS+; abdominal aorta nontender and not dilated by palpation. Back: no CVA tenderness Pulses 2+ Musculoskeletal: full range of motion, normal strength, no joint deformities Extremities: no clubbing cyanosis or edema, Homan's sign negative  Neurologic: grossly nonfocal; Cranial nerves grossly wnl Psychologic: Normal mood and affect   ECG (independently read by me): Normal sinus rhythm at 61 bpm.  No ectopy.  Normal intervals.  September 2018 ECG (independently read by me): sinus bradycardia 53 bpm.  Normal intervals.  No ST segment  changes.  Decreased voltage anterolaterally  January 2018 ECG (independently read by me): Sinus bradycardia 57 bpm.  No ST segment changes.  There was malpositioning of her anterior leads.  May 2017 ECG (independently read by me): Sinus bradycardia 53 bpm.  No ectopy.  Normal intervals.  No ST segment changes.  November 2016  ECG (independently read by me): Sinus bradycardia 58 bpm.  Normal intervals.    October 2015 ECG (and apparently read by me) : Sinus rhythm with frequent PACs and transient atrial bigeminal pattern  Prior November 2014 ECG: Sinus rhythm with occasional PACs. Intervals are normal. Nonspecific T changes.  LABS: BMP Latest Ref Rng & Units 01/28/2013 01/28/2013 04/29/2009  Glucose 70 - 99 mg/dL - 118(H) 96  BUN 6 - 23 mg/dL - 17 17  Creatinine 0.50 - 1.10 mg/dL 0.87 0.95 0.91  Sodium 135 - 145 mEq/L - 139 143  Potassium 3.5 - 5.1 mEq/L - 4.1 4.0  Chloride 96 - 112 mEq/L - 102 109  CO2 19 - 32 mEq/L - 26 29  Calcium 8.4 - 10.5 mg/dL - 10.0 8.6   Hepatic Function Latest Ref Rng & Units 01/28/2013 04/28/2009  Total Protein 6.0 - 8.3 g/dL 7.4 5.8(L)  Albumin 3.5 - 5.2 g/dL 4.3 3.3(L)  AST 0 - 37 U/L 22 15  ALT 0 - 35 U/L 20 16  Alk Phosphatase 39 - 117 U/L 73 46  Total Bilirubin 0.3 - 1.2 mg/dL 0.4 0.4  Bilirubin, Direct 0.0 - 0.3 mg/dL - 0.2   CBC Latest Ref Rng & Units 01/28/2013 01/28/2013 04/28/2009  WBC 4.0 - 10.5 K/uL 5.8 6.5 6.3  Hemoglobin 12.0 - 15.0 g/dL 13.4 14.0 12.1  Hematocrit 36.0 - 46.0 % 39.1 41.9 35.5(L)  Platelets 150 - 400 K/uL 170 202 161   Lab Results  Component Value Date   TSH 2.19 10/09/2015     Lipid Panel     Component Value Date/Time   CHOL 118 01/29/2013 0520   TRIG 66 01/29/2013 0520   HDL 66 01/29/2013 0520   CHOLHDL 1.8 01/29/2013 0520   VLDL 13 01/29/2013 0520   LDLCALC 39 01/29/2013 0520     RADIOLOGY: Ct Head (brain) Wo Contrast  01/28/2013   CLINICAL DATA:  Right upper extremity weakness and numbness  EXAM: CT  HEAD WITHOUT CONTRAST  TECHNIQUE: Contiguous axial images were obtained from the base of the skull through the vertex without intravenous contrast. Study was obtained within 24 hr of patient's arrival at the emergency department.  COMPARISON:  Brain MRI April 27, 2009 and brain CT April 27, 2009  FINDINGS: There is mild diffuse atrophy. There is no mass, hemorrhage, extra-axial fluid collection, or midline shift. There is extensive small vessel disease throughout the centra semiovale bilaterally, a stable finding. There is no new gray-white compartment lesion. There is no demonstrable acute infarct. Bony calvarium appears intact. The mastoid air cells are clear.  IMPRESSION: Mild atrophy with extensive supratentorial small vessel disease. No intracranial mass, hemorrhage, or acute appearing infarct.   Electronically Signed   By: Lowella Grip M.D.   On: 01/28/2013 14:42   Mr Brain Wo Contrast  01/28/2013   CLINICAL DATA:  Numbness in the arms. Dry hacking cough. History of hypertension and dyslipidemia.  EXAM: MRI HEAD WITHOUT CONTRAST  MRA HEAD WITHOUT CONTRAST  TECHNIQUE: Multiplanar, multiecho pulse sequences of the brain and surrounding structures were obtained without intravenous contrast. Angiographic images of the head were obtained using MRA technique without contrast.  COMPARISON:  CT 01/28/2013.  FINDINGS: MRI HEAD FINDINGS  The patient was unable to remain motionless for the exam. Small or subtle lesions could be overlooked.  No evidence for acute infarction, hemorrhage, mass lesion, hydrocephalus, or extra-axial fluid. Moderate age-related atrophy. Extensive chronic microvascular ischemic change. Basal ganglia mineralization but no foci of chronic  hemorrhage. Flow voids are maintained. No osseous findings. No remote large vessel infarct. Bilateral cataract extraction. No acute sinus or mastoid fluid. Good general agreement with prior CT.  MRA HEAD FINDINGS  Grossly patent internal carotid  arteries, and basilar artery. Vertebrals are codominant. No flow-limiting intracranial stenosis, branch occlusion, or aneurysm is evident on this motion degraded exam.  IMPRESSION: MRI HEAD IMPRESSION  No acute intracranial findings are evident. There is advanced atrophy with chronic microvascular ischemic change.  MRA HEAD IMPRESSION  No intracranial flow reducing lesion is evident.   Electronically Signed   By: Rolla Flatten M.D.   On: 01/28/2013 18:47   Mr Jodene Nam Head/brain Wo Cm  01/28/2013   CLINICAL DATA:  Numbness in the arms. Dry hacking cough. History of hypertension and dyslipidemia.  EXAM: MRI HEAD WITHOUT CONTRAST  MRA HEAD WITHOUT CONTRAST  TECHNIQUE: Multiplanar, multiecho pulse sequences of the brain and surrounding structures were obtained without intravenous contrast. Angiographic images of the head were obtained using MRA technique without contrast.  COMPARISON:  CT 01/28/2013.  FINDINGS: MRI HEAD FINDINGS  The patient was unable to remain motionless for the exam. Small or subtle lesions could be overlooked.  No evidence for acute infarction, hemorrhage, mass lesion, hydrocephalus, or extra-axial fluid. Moderate age-related atrophy. Extensive chronic microvascular ischemic change. Basal ganglia mineralization but no foci of chronic hemorrhage. Flow voids are maintained. No osseous findings. No remote large vessel infarct. Bilateral cataract extraction. No acute sinus or mastoid fluid. Good general agreement with prior CT.  MRA HEAD FINDINGS  Grossly patent internal carotid arteries, and basilar artery. Vertebrals are codominant. No flow-limiting intracranial stenosis, branch occlusion, or aneurysm is evident on this motion degraded exam.  IMPRESSION: MRI HEAD IMPRESSION  No acute intracranial findings are evident. There is advanced atrophy with chronic microvascular ischemic change.  MRA HEAD IMPRESSION  No intracranial flow reducing lesion is evident.   Electronically Signed   By: Rolla Flatten  M.D.   On: 01/28/2013 18:47   IMPRESSION:  1. Essential hypertension   2. Pure hypercholesterolemia   3. History of TIA (transient ischemic attack)   4. Dizziness   5. Restless leg syndrome     ASSESSMENT AND PLAN: Valerie Barker is a 84 years old Caucasian female with history of prior TIA's and remotely had been  diagnosed as having a TIA with old asymptomatic lacunar infarct. On CT, MRI and MRA studies  significant small vessel disease of her brain had been demonstrated.  She has normal systolic function and evidence for aortic sclerosis on echo assessment. She has been Plavix with her TIA history.  I reviewed her most recent emergency room evaluation after she had fallen while getting up from the toilet.  On that evaluation her resting pulse was 52.  Her blood pressure today remained stable.  Her ECG today shows sinus rhythm at 61 bpm without ectopy and with normal intervals.  She has only been on Toprol-XL 12.5 mg daily.  I am electing to wean and discontinue this.  Since she took a dose last evening on May 5, she will take a 12.5 mg dose on May 7, May 9, May 12, and her last dose will be on May 16.  This should enable her to wean slowly from her current regimen.  She will need to monitor her blood pressure.  Present, there is no significant edema on her combination therapy with losartan HCT 100/25.  She did not have any anginal symptomatology.  She continues to have  restless legs and has benefited from Requip.  She will monitor her heart rate and blood pressure.  As long as she is stable I will see her in 4 months for evaluation but I will be available sooner if problems arise. Time spent: 25 minutes  Troy Sine, MD, [facility]  08/07/2017 2:07 PM

## 2017-08-07 NOTE — Patient Instructions (Signed)
Medication Instructions:  Take metoprolol only on these days:  May 7, 9, 12, 16, then STOP  Follow-Up: 4 months with Dr. Claiborne Billings   If you need a refill on your cardiac medications before your next appointment, please call your pharmacy.

## 2017-08-30 DIAGNOSIS — M6281 Muscle weakness (generalized): Secondary | ICD-10-CM | POA: Diagnosis not present

## 2017-08-30 DIAGNOSIS — R260 Ataxic gait: Secondary | ICD-10-CM | POA: Diagnosis not present

## 2017-08-30 DIAGNOSIS — H8113 Benign paroxysmal vertigo, bilateral: Secondary | ICD-10-CM | POA: Diagnosis not present

## 2017-08-30 DIAGNOSIS — M545 Low back pain: Secondary | ICD-10-CM | POA: Diagnosis not present

## 2017-09-20 DIAGNOSIS — R260 Ataxic gait: Secondary | ICD-10-CM | POA: Diagnosis not present

## 2017-09-20 DIAGNOSIS — M545 Low back pain: Secondary | ICD-10-CM | POA: Diagnosis not present

## 2017-09-20 DIAGNOSIS — H8113 Benign paroxysmal vertigo, bilateral: Secondary | ICD-10-CM | POA: Diagnosis not present

## 2017-09-20 DIAGNOSIS — M6281 Muscle weakness (generalized): Secondary | ICD-10-CM | POA: Diagnosis not present

## 2017-09-26 DIAGNOSIS — M545 Low back pain: Secondary | ICD-10-CM | POA: Diagnosis not present

## 2017-09-26 DIAGNOSIS — R260 Ataxic gait: Secondary | ICD-10-CM | POA: Diagnosis not present

## 2017-09-26 DIAGNOSIS — M6281 Muscle weakness (generalized): Secondary | ICD-10-CM | POA: Diagnosis not present

## 2017-09-26 DIAGNOSIS — H8113 Benign paroxysmal vertigo, bilateral: Secondary | ICD-10-CM | POA: Diagnosis not present

## 2017-09-29 DIAGNOSIS — Z961 Presence of intraocular lens: Secondary | ICD-10-CM | POA: Diagnosis not present

## 2017-09-29 DIAGNOSIS — H04123 Dry eye syndrome of bilateral lacrimal glands: Secondary | ICD-10-CM | POA: Diagnosis not present

## 2017-09-29 DIAGNOSIS — H5211 Myopia, right eye: Secondary | ICD-10-CM | POA: Diagnosis not present

## 2017-10-03 DIAGNOSIS — H8113 Benign paroxysmal vertigo, bilateral: Secondary | ICD-10-CM | POA: Diagnosis not present

## 2017-10-03 DIAGNOSIS — M6281 Muscle weakness (generalized): Secondary | ICD-10-CM | POA: Diagnosis not present

## 2017-10-03 DIAGNOSIS — R260 Ataxic gait: Secondary | ICD-10-CM | POA: Diagnosis not present

## 2017-10-03 DIAGNOSIS — M545 Low back pain: Secondary | ICD-10-CM | POA: Diagnosis not present

## 2017-10-11 DIAGNOSIS — I10 Essential (primary) hypertension: Secondary | ICD-10-CM | POA: Diagnosis not present

## 2017-10-11 DIAGNOSIS — R5382 Chronic fatigue, unspecified: Secondary | ICD-10-CM | POA: Diagnosis not present

## 2017-10-16 DIAGNOSIS — R5383 Other fatigue: Secondary | ICD-10-CM | POA: Diagnosis not present

## 2017-10-16 DIAGNOSIS — G2581 Restless legs syndrome: Secondary | ICD-10-CM | POA: Diagnosis not present

## 2017-10-16 DIAGNOSIS — R42 Dizziness and giddiness: Secondary | ICD-10-CM | POA: Diagnosis not present

## 2017-10-25 DIAGNOSIS — I10 Essential (primary) hypertension: Secondary | ICD-10-CM | POA: Diagnosis not present

## 2017-11-27 DIAGNOSIS — H9190 Unspecified hearing loss, unspecified ear: Secondary | ICD-10-CM | POA: Diagnosis not present

## 2017-11-27 DIAGNOSIS — G2581 Restless legs syndrome: Secondary | ICD-10-CM | POA: Diagnosis not present

## 2017-11-27 DIAGNOSIS — R5382 Chronic fatigue, unspecified: Secondary | ICD-10-CM | POA: Diagnosis not present

## 2017-11-27 DIAGNOSIS — I10 Essential (primary) hypertension: Secondary | ICD-10-CM | POA: Diagnosis not present

## 2017-12-25 ENCOUNTER — Other Ambulatory Visit: Payer: Self-pay

## 2017-12-25 NOTE — Patient Outreach (Signed)
[facility]) Care Management  12/25/2017  ALYNAH SCHONE 1923-03-15 [id number]   Medication Adherence call to Mrs. Unk Pinto left a message for patient to call back patient is due on Valsartan 320 mg. Mrs. Jenkinson is showing past due under [place].   [facility] Management Direct Dial [phone]  Fax [phone] [email or web address]

## 2017-12-27 ENCOUNTER — Ambulatory Visit: Payer: Medicare Other | Admitting: Cardiovascular Disease

## 2017-12-27 ENCOUNTER — Encounter: Payer: Self-pay | Admitting: Cardiovascular Disease

## 2017-12-27 VITALS — BP 131/72 | HR 67 | Ht 59.0 in | Wt 129.8 lb

## 2017-12-27 DIAGNOSIS — G2581 Restless legs syndrome: Secondary | ICD-10-CM

## 2017-12-27 DIAGNOSIS — Z8673 Personal history of transient ischemic attack (TIA), and cerebral infarction without residual deficits: Secondary | ICD-10-CM | POA: Diagnosis not present

## 2017-12-27 DIAGNOSIS — I10 Essential (primary) hypertension: Secondary | ICD-10-CM

## 2017-12-27 DIAGNOSIS — E7800 Pure hypercholesterolemia, unspecified: Secondary | ICD-10-CM | POA: Diagnosis not present

## 2017-12-27 NOTE — Patient Instructions (Signed)
Medication Instructions:  Your physician recommends that you continue on your current medications as directed. Please refer to the Current Medication list given to you today.  Follow-Up: Your physician wants you to follow-up in: 6 months with Dr. Claiborne Billings.  You will receive a reminder letter in the mail two months in advance. If you don't receive a letter, please call our office to schedule the follow-up appointment.   Any Other Special Instructions Will Be Listed Below (If Applicable).  Happy Rudene Anda!!   If you need a refill on your cardiac medications before your next appointment, please call your pharmacy.

## 2017-12-29 ENCOUNTER — Encounter: Payer: Self-pay | Admitting: Cardiovascular Disease

## 2017-12-29 NOTE — Progress Notes (Signed)
Patient ID: Valerie Barker, female   DOB: September 07, 1922, 84 y.o.   MRN: [id number]     HPI: Valerie Barker is a 84 y.o. female who presents to the office for a 4 month cardiology followup evaluation.  Valerie Barker is the wife of my former patient Dr. Buckner Malta who died on 02-Apr-2015.  Remotely, Valerie Barker suffered a TIA and has documented old asymptomatic lacunar infarcts. Prior to undergoing urologic surgery at [facility] in 2012 a nuclear perfusion study showed normal perfusion. An echo Doppler study demonstrated mild to moderate TR, trace MR, and mild aortic sclerosis without stenosis. She had normal systolic function. Carotid studies done in 2011 were normal.  She was  hospitalized for 2 nights after experiencing an TIA on 01/08/2013. Her symptoms resolved spontaneously. A CT of her head showed mild atrophy with extensive supratentorial small vessel disease without intracranial mass, hemorrhage or acute appearing infarction. An MRI of her brain did not show any acute intracranial findings but did show advanced atrophy with chronic microvascular ischemic changes. MRA did not reveal any intracranial flow reducing lesions. During that hospitalization a f/u echo Doppler study  on 01/29/2013 showed an ejection fraction at 65%. There was mild aortic sclerosis without stenosis. Carotid Doppler study showed minimal plaque with less than 39% reduction. She  was sent home on atorvastatin 10 mg Plavix 75 mg in addition to her valsartan HCT 320/12.5 and zolpidem.  She has had issues with swelling of legs and feet.  She also has continued issues with restless legs.  She has been taking requip 0.5 mg shortly before going to bed, but it takes a while for the medication to work and usually does not completely suffice.  At times she has taken a half a pill if she is to have a daytime nap.  When I last saw her, she was complaining of experiencing a "bobble" sensation.  She underwent carotid duplex imaging which was  normal in November 2016.  She denies any episodes of chest pain.  She is unaware of palpitations.  She admits to being fatigued , but actually feels improved from previously.  At times if she is on her back for certain duration.  She does note transient right arm paresthesias.  She denies associated chest pressure.  She presents for evaluation.  She underwent extensive dental work with her dentist, Chief Financial Officer, and endodontist.  She also had eyelid lift surgery at  [facility] in December 2017 and tolerated this well from a cardiovascular standpoint.  When I last saw she felt well was experiencing significant tiredness and fatigue.  At times she noted  some dizziness with movement.  She has restless legs for which she is on requip in addition to gabapentin.  She denied chest pain.  Was unaware of palpitations.  I last saw her in May 2019 following an episode of dizziness.  As she was getting off the toilet and was trying to get or toilet paper she bent over into the cabinet and when she stood up she became dizzy lightheaded fell backwards and hit her head.  Since she was on chronic Plavix therapy she presented to the emergency room for evaluation.  No acute abnormality was noted.  However, her pulse in the emergency room was 52 and blood pressure 107/41.  She has been on losartan HCT 100/25 mg, Toprol-XL 12.5 mg and this has been held if her heart rate is less than 55.  I reviewed her emergency room evaluation.  At that time, I elected to wean and discontinue her very low-dose Toprol-XL 12.5 mg.  Since I last saw her, unfortunately her 65 year old sister passed away 4 weeks ago.  She had spoken to her sister on a daily basis.  Valerie Barker continues to live in independent living at [facility].  She denies chest pain.  She is unaware of palpitations.  She has not required use of HCTZ.  Continues to be on valsartan 320 mg for hypertension.  She is tolerating atorvastatin for hyperlipidemia.  She  presents for reevaluation.  Past Medical History:  Diagnosis Date  . Heart disease   . Hypertension   . IBS (irritable bowel syndrome)   . Stroke ([place])   . TIA (transient ischemic attack)     Past Surgical History:  Procedure Laterality Date  . ABDOMINAL HYSTERECTOMY  1978  . BUNIONECTOMY  2006, 1996  . LAPAROSCOPIC SALPINGO OOPHERECTOMY  1988    Allergies  Allergen Reactions  . Duloxetine Other (See Comments)    unknown  . Erythromycin   . Mirtazapine     Other reaction(s): Other (See Comments) unknown  . Trazodone     Other reaction(s): Other (See Comments) unknown  . Zolpidem     Other reaction(s): Other (See Comments) unknown  . Cyclobenzaprine Anxiety    Unknown  . Diazepam Rash    unknown    Current Outpatient Medications  Medication Sig Dispense Refill  . atorvastatin (LIPITOR) 10 MG tablet TAKE 1/2 TABLET EVERY DAY. 45 tablet 4  . Biotin 5000 MCG CAPS Take 1 capsule by mouth daily.    . Cholecalciferol (VITAMIN D) 2000 UNITS CAPS Take 1 capsule by mouth daily.    . clopidogrel (PLAVIX) 75 MG tablet Take 75 mg by mouth daily.    . Cyanocobalamin (VITAMIN B-12 PO) Take 2,500 mcg by mouth daily.    Marland Kitchen gabapentin (NEURONTIN) 100 MG capsule Take 1 capsule by mouth daily.    . Multiple Vitamin (MULTIVITAMIN) capsule Take 1 capsule by mouth daily.    Marland Kitchen rOPINIRole (REQUIP) 0.5 MG tablet Take by mouth at bedtime. 2 tabs at bedtime, and takes 1/2 tab in afternoon prn    . hydrochlorothiazide (HYDRODIURIL) 25 MG tablet     . valsartan (DIOVAN) 320 MG tablet      No current facility-administered medications for this visit.     Social History   Socioeconomic History  . Marital status: Married    Spouse name: Not on file  . Number of children: Not on file  . Years of education: Not on file  . Highest education level: Not on file  Occupational History  . Not on file  Social Needs  . Financial resource strain: Not on file  . Food insecurity:    Worry: Not on  file    Inability: Not on file  . Transportation needs:    Medical: Not on file    Non-medical: Not on file  Tobacco Use  . Smoking status: Never Smoker  . Smokeless tobacco: Never Used  Substance and Sexual Activity  . Alcohol use: No  . Drug use: No  . Sexual activity: Not on file  Lifestyle  . Physical activity:    Days per week: Not on file    Minutes per session: Not on file  . Stress: Not on file  Relationships  . Social connections:    Talks on phone: Not on file    Gets together: Not on file    Attends  religious service: Not on file    Active member of club or organization: Not on file    Attends meetings of clubs or organizations: Not on file    Relationship status: Not on file  . Intimate partner violence:    Fear of current or ex partner: Not on file    Emotionally abused: Not on file    Physically abused: Not on file    Forced sexual activity: Not on file  Other Topics Concern  . Not on file  Social History Narrative   Lives in [place] at [place].  Education BA degree.  Retired.  3 children.  Son, [place].  Caffeine 1=2 cups daily.   Socially she is widowed as of December 2016.  Her husband was a former Scientist, physiological of education that [employer] and developed progressive dementia Prior to his death  Family History  Problem Relation Age of Onset  . COPD Mother   . Schizophrenia Brother   . Stroke Father    ROS General: Negative; No fevers, chills, or night sweats;  HEENT: Negative; No changes in vision or hearing, sinus congestion, difficulty swallowing Pulmonary: Negative; No cough, wheezing, shortness of breath, hemoptysis Cardiovascular: Negative; No chest pain, presyncope, syncope, palpitations Positive for leg swelling  GI: Negative; No nausea, vomiting, diarrhea, or abdominal pain GU: Positive for recurrent urinary incontinence despite her prior urologic surgery; No dysuria, hematuria, or difficulty voiding Musculoskeletal: Negative; no  myalgias, joint pain, or weakness Hematologic/Oncology: Negative; no easy bruising, bleeding Endocrine: Negative; no heat/cold intolerance; no diabetes Neuro: Positive for decreased short-term memory and history of TIA; Episode of right arm numbness in July 2017; no changes in balance, headaches Skin: Negative; No rashes or skin lesions Psychiatric: Negative; No behavioral problems, depression Sleep: Positive for restless leg syndrome; No snoring, daytime sleepiness, hypersomnolence, bruxism, hypnogognic hallucinations, no cataplexy Other comprehensive 14 point system review is negative.   PE BP 131/72   Pulse 67   Ht '4\' 11"'$  (1.499 m)   Wt 129 lb 12.8 oz (58.9 kg)   BMI 26.22 kg/m    Repeat blood pressure by me was 128/70  Wt Readings from Last 3 Encounters:  12/27/17 129 lb 12.8 oz (58.9 kg)  08/07/17 124 lb 9.6 oz (56.5 kg)  08/01/17 122 lb (55.3 kg)    General: Alert, oriented, no distress.  Peers younger than stated age Skin: normal turgor, no rashes, warm and dry HEENT: Normocephalic, atraumatic. Pupils equal round and reactive to light; sclera anicteric; extraocular muscles intact;  Nose without nasal septal hypertrophy Mouth/Parynx benign; Mallinpatti scale 3 Neck: No JVD, no carotid bruits; normal carotid upstroke Lungs: clear to ausculatation and percussion; no wheezing or rales Chest wall: without tenderness to palpitation Heart: PMI not displaced, RRR, s1 s2 normal, 1/6 systolic murmur, no diastolic murmur, no rubs, gallops, thrills, or heaves Abdomen: soft, nontender; no hepatosplenomehaly, BS+; abdominal aorta nontender and not dilated by palpation. Back: no CVA tenderness Pulses 2+ Musculoskeletal: full range of motion, normal strength, no joint deformities Extremities: no clubbing cyanosis or edema, Homan's sign negative  Neurologic: grossly nonfocal; Cranial nerves grossly wnl Psychologic: Normal mood and affect   ECG (independently read by me): Normal  sinus rhythm at 61 bpm.  No ectopy.  Normal intervals.  September 2018 ECG (independently read by me): sinus bradycardia 53 bpm.  Normal intervals.  No ST segment changes.  Decreased voltage anterolaterally  January 2018 ECG (independently read by me): Sinus bradycardia 57 bpm.  No ST segment changes.  There was malpositioning of her anterior leads.  May 2017 ECG (independently read by me): Sinus bradycardia 53 bpm.  No ectopy.  Normal intervals.  No ST segment changes.  November 2016 ECG (independently read by me): Sinus bradycardia 58 bpm.  Normal intervals.    October 2015 ECG (and apparently read by me) : Sinus rhythm with frequent PACs and transient atrial bigeminal pattern  Prior November 2014 ECG: Sinus rhythm with occasional PACs. Intervals are normal. Nonspecific T changes.  LABS: BMP Latest Ref Rng & Units 01/28/2013 01/28/2013 04/29/2009  Glucose 70 - 99 mg/dL - 118(H) 96  BUN 6 - 23 mg/dL - 17 17  Creatinine 0.50 - 1.10 mg/dL 0.87 0.95 0.91  Sodium 135 - 145 mEq/L - 139 143  Potassium 3.5 - 5.1 mEq/L - 4.1 4.0  Chloride 96 - 112 mEq/L - 102 109  CO2 19 - 32 mEq/L - 26 29  Calcium 8.4 - 10.5 mg/dL - 10.0 8.6   Hepatic Function Latest Ref Rng & Units 01/28/2013 04/28/2009  Total Protein 6.0 - 8.3 g/dL 7.4 5.8(L)  Albumin 3.5 - 5.2 g/dL 4.3 3.3(L)  AST 0 - 37 U/L 22 15  ALT 0 - 35 U/L 20 16  Alk Phosphatase 39 - 117 U/L 73 46  Total Bilirubin 0.3 - 1.2 mg/dL 0.4 0.4  Bilirubin, Direct 0.0 - 0.3 mg/dL - 0.2   CBC Latest Ref Rng & Units 01/28/2013 01/28/2013 04/28/2009  WBC 4.0 - 10.5 K/uL 5.8 6.5 6.3  Hemoglobin 12.0 - 15.0 g/dL 13.4 14.0 12.1  Hematocrit 36.0 - 46.0 % 39.1 41.9 35.5(L)  Platelets 150 - 400 K/uL 170 202 161   Lab Results  Component Value Date   TSH 2.19 10/09/2015     Lipid Panel     Component Value Date/Time   CHOL 118 01/29/2013 0520   TRIG 66 01/29/2013 0520   HDL 66 01/29/2013 0520   CHOLHDL 1.8 01/29/2013 0520   VLDL 13 01/29/2013 0520    LDLCALC 39 01/29/2013 0520     RADIOLOGY: Ct Head (brain) Wo Contrast  01/28/2013   CLINICAL DATA:  Right upper extremity weakness and numbness  EXAM: CT HEAD WITHOUT CONTRAST  TECHNIQUE: Contiguous axial images were obtained from the base of the skull through the vertex without intravenous contrast. Study was obtained within 24 hr of patient's arrival at the emergency department.  COMPARISON:  Brain MRI April 27, 2009 and brain CT April 27, 2009  FINDINGS: There is mild diffuse atrophy. There is no mass, hemorrhage, extra-axial fluid collection, or midline shift. There is extensive small vessel disease throughout the centra semiovale bilaterally, a stable finding. There is no new gray-white compartment lesion. There is no demonstrable acute infarct. Bony calvarium appears intact. The mastoid air cells are clear.  IMPRESSION: Mild atrophy with extensive supratentorial small vessel disease. No intracranial mass, hemorrhage, or acute appearing infarct.   Electronically Signed   By: Lowella Grip M.D.   On: 01/28/2013 14:42   Mr Brain Wo Contrast  01/28/2013   CLINICAL DATA:  Numbness in the arms. Dry hacking cough. History of hypertension and dyslipidemia.  EXAM: MRI HEAD WITHOUT CONTRAST  MRA HEAD WITHOUT CONTRAST  TECHNIQUE: Multiplanar, multiecho pulse sequences of the brain and surrounding structures were obtained without intravenous contrast. Angiographic images of the head were obtained using MRA technique without contrast.  COMPARISON:  CT 01/28/2013.  FINDINGS: MRI HEAD FINDINGS  The patient was unable to remain motionless for the exam. Small or subtle  lesions could be overlooked.  No evidence for acute infarction, hemorrhage, mass lesion, hydrocephalus, or extra-axial fluid. Moderate age-related atrophy. Extensive chronic microvascular ischemic change. Basal ganglia mineralization but no foci of chronic hemorrhage. Flow voids are maintained. No osseous findings. No remote large vessel  infarct. Bilateral cataract extraction. No acute sinus or mastoid fluid. Good general agreement with prior CT.  MRA HEAD FINDINGS  Grossly patent internal carotid arteries, and basilar artery. Vertebrals are codominant. No flow-limiting intracranial stenosis, branch occlusion, or aneurysm is evident on this motion degraded exam.  IMPRESSION: MRI HEAD IMPRESSION  No acute intracranial findings are evident. There is advanced atrophy with chronic microvascular ischemic change.  MRA HEAD IMPRESSION  No intracranial flow reducing lesion is evident.   Electronically Signed   By: Rolla Flatten M.D.   On: 01/28/2013 18:47   Mr Jodene Nam Head/brain Wo Cm  01/28/2013   CLINICAL DATA:  Numbness in the arms. Dry hacking cough. History of hypertension and dyslipidemia.  EXAM: MRI HEAD WITHOUT CONTRAST  MRA HEAD WITHOUT CONTRAST  TECHNIQUE: Multiplanar, multiecho pulse sequences of the brain and surrounding structures were obtained without intravenous contrast. Angiographic images of the head were obtained using MRA technique without contrast.  COMPARISON:  CT 01/28/2013.  FINDINGS: MRI HEAD FINDINGS  The patient was unable to remain motionless for the exam. Small or subtle lesions could be overlooked.  No evidence for acute infarction, hemorrhage, mass lesion, hydrocephalus, or extra-axial fluid. Moderate age-related atrophy. Extensive chronic microvascular ischemic change. Basal ganglia mineralization but no foci of chronic hemorrhage. Flow voids are maintained. No osseous findings. No remote large vessel infarct. Bilateral cataract extraction. No acute sinus or mastoid fluid. Good general agreement with prior CT.  MRA HEAD FINDINGS  Grossly patent internal carotid arteries, and basilar artery. Vertebrals are codominant. No flow-limiting intracranial stenosis, branch occlusion, or aneurysm is evident on this motion degraded exam.  IMPRESSION: MRI HEAD IMPRESSION  No acute intracranial findings are evident. There is advanced  atrophy with chronic microvascular ischemic change.  MRA HEAD IMPRESSION  No intracranial flow reducing lesion is evident.   Electronically Signed   By: Rolla Flatten M.D.   On: 01/28/2013 18:47   IMPRESSION:  1. Essential hypertension   2. Pure hypercholesterolemia   3. History of TIA (transient ischemic attack)   4. Restless leg syndrome     ASSESSMENT AND PLAN: Valerie Barker is a 84 years old Caucasian female with history of prior TIA's and remotely had been  diagnosed as having a TIA with old asymptomatic lacunar infarct. On CT, MRI and MRA studies  significant small vessel disease of her brain had been demonstrated.  She has normal systolic function and evidence for aortic sclerosis on echo assessment. She has been Plavix with her TIA history.  When I last saw her, she had sinus bradycardia and I weaned and discontinued low-dose Toprol-XL.  Her resting pulse today is sinus in the 60s.  Her blood pressure is stable.  She has not had any palpitations since discontinuing beta-blocker therapy.  She continues to tolerate valsartan for hypertension.  She continues to be on Plavix for history of prior TIAs.  She is tolerating atorvastatin for hyperlipidemia.  LDL cholesterol in January 2019 was excellent at 37.  She is in need for hearing aids.  She continues to be relatively active.  She has restless leg syndrome and takes Requip with benefit.  I will see her in 6 months for reevaluation.   Spent: 25 minutes Jayzen Paver A.  Claiborne Billings, MD, [facility]  12/29/2017 7:55 PM

## 2018-01-04 ENCOUNTER — Other Ambulatory Visit: Payer: Self-pay

## 2018-01-04 NOTE — Patient Outreach (Signed)
[facility]) Care Management  01/04/2018  BRITTEN PARADY 01-May-1922 [id number]   Medication Adherence call to Mrs. Unk Pinto spoke with patient she is taking Valsartan 320 and Atorvastatin 10 ,patient just pick up both medication from the pharmacy. Mrs. Tanton is showing past due under [place].   [facility] Direct Dial [phone]  Fax [phone] [email or web address]

## 2018-01-09 ENCOUNTER — Other Ambulatory Visit: Payer: Self-pay | Admitting: Internal Medicine

## 2018-01-09 ENCOUNTER — Other Ambulatory Visit: Payer: Self-pay

## 2018-01-09 DIAGNOSIS — Z1231 Encounter for screening mammogram for malignant neoplasm of breast: Secondary | ICD-10-CM

## 2018-01-23 ENCOUNTER — Other Ambulatory Visit: Payer: Self-pay | Admitting: Internal Medicine

## 2018-01-23 DIAGNOSIS — R509 Fever, unspecified: Secondary | ICD-10-CM | POA: Diagnosis not present

## 2018-01-23 DIAGNOSIS — M7989 Other specified soft tissue disorders: Secondary | ICD-10-CM

## 2018-01-23 DIAGNOSIS — L309 Dermatitis, unspecified: Secondary | ICD-10-CM | POA: Diagnosis not present

## 2018-01-23 DIAGNOSIS — R600 Localized edema: Secondary | ICD-10-CM | POA: Diagnosis not present

## 2018-01-24 ENCOUNTER — Ambulatory Visit
Admission: RE | Admit: 2018-01-24 | Discharge: 2018-01-24 | Disposition: A | Discharge status: Home / Self Care | Payer: Medicare Other | Source: Ambulatory Visit | Attending: Internal Medicine | Admitting: Internal Medicine

## 2018-01-24 DIAGNOSIS — M79662 Pain in left lower leg: Secondary | ICD-10-CM | POA: Diagnosis not present

## 2018-01-24 DIAGNOSIS — M7989 Other specified soft tissue disorders: Secondary | ICD-10-CM

## 2018-01-25 DIAGNOSIS — M7989 Other specified soft tissue disorders: Secondary | ICD-10-CM | POA: Diagnosis not present

## 2018-01-25 DIAGNOSIS — R220 Localized swelling, mass and lump, head: Secondary | ICD-10-CM | POA: Diagnosis not present

## 2018-01-25 DIAGNOSIS — L659 Nonscarring hair loss, unspecified: Secondary | ICD-10-CM | POA: Diagnosis not present

## 2018-01-25 DIAGNOSIS — K120 Recurrent oral aphthae: Secondary | ICD-10-CM | POA: Diagnosis not present

## 2018-01-31 DIAGNOSIS — M6281 Muscle weakness (generalized): Secondary | ICD-10-CM | POA: Diagnosis not present

## 2018-01-31 DIAGNOSIS — H8113 Benign paroxysmal vertigo, bilateral: Secondary | ICD-10-CM | POA: Diagnosis not present

## 2018-01-31 DIAGNOSIS — M545 Low back pain: Secondary | ICD-10-CM | POA: Diagnosis not present

## 2018-01-31 DIAGNOSIS — R260 Ataxic gait: Secondary | ICD-10-CM | POA: Diagnosis not present

## 2018-02-01 DIAGNOSIS — B029 Zoster without complications: Secondary | ICD-10-CM | POA: Diagnosis not present

## 2018-02-13 ENCOUNTER — Ambulatory Visit: Payer: Medicare Other

## 2018-02-14 DIAGNOSIS — B0229 Other postherpetic nervous system involvement: Secondary | ICD-10-CM | POA: Diagnosis not present

## 2018-02-14 DIAGNOSIS — M545 Low back pain: Secondary | ICD-10-CM | POA: Diagnosis not present

## 2018-02-14 DIAGNOSIS — M549 Dorsalgia, unspecified: Secondary | ICD-10-CM | POA: Diagnosis not present

## 2018-03-08 DIAGNOSIS — R260 Ataxic gait: Secondary | ICD-10-CM | POA: Diagnosis not present

## 2018-03-08 DIAGNOSIS — M545 Low back pain: Secondary | ICD-10-CM | POA: Diagnosis not present

## 2018-03-08 DIAGNOSIS — H8113 Benign paroxysmal vertigo, bilateral: Secondary | ICD-10-CM | POA: Diagnosis not present

## 2018-03-08 DIAGNOSIS — M6281 Muscle weakness (generalized): Secondary | ICD-10-CM | POA: Diagnosis not present

## 2018-03-15 DIAGNOSIS — M6281 Muscle weakness (generalized): Secondary | ICD-10-CM | POA: Diagnosis not present

## 2018-03-15 DIAGNOSIS — H8113 Benign paroxysmal vertigo, bilateral: Secondary | ICD-10-CM | POA: Diagnosis not present

## 2018-03-15 DIAGNOSIS — M545 Low back pain: Secondary | ICD-10-CM | POA: Diagnosis not present

## 2018-03-15 DIAGNOSIS — R260 Ataxic gait: Secondary | ICD-10-CM | POA: Diagnosis not present

## 2018-03-20 DIAGNOSIS — R260 Ataxic gait: Secondary | ICD-10-CM | POA: Diagnosis not present

## 2018-03-20 DIAGNOSIS — M6281 Muscle weakness (generalized): Secondary | ICD-10-CM | POA: Diagnosis not present

## 2018-03-20 DIAGNOSIS — H8113 Benign paroxysmal vertigo, bilateral: Secondary | ICD-10-CM | POA: Diagnosis not present

## 2018-03-20 DIAGNOSIS — M545 Low back pain: Secondary | ICD-10-CM | POA: Diagnosis not present

## 2018-04-11 ENCOUNTER — Ambulatory Visit
Admission: RE | Admit: 2018-04-11 | Discharge: 2018-04-11 | Disposition: A | Discharge status: Home / Self Care | Payer: Medicare Other | Source: Ambulatory Visit | Attending: Internal Medicine | Admitting: Internal Medicine

## 2018-04-11 ENCOUNTER — Emergency Department (HOSPITAL_COMMUNITY)
Admission: EM | Admit: 2018-04-11 | Discharge: 2018-04-11 | Disposition: A | Discharge status: Home / Self Care | Payer: Medicare Other | Attending: Emergency Medicine | Admitting: Emergency Medicine

## 2018-04-11 ENCOUNTER — Encounter (HOSPITAL_COMMUNITY): Payer: Self-pay | Admitting: *Deleted

## 2018-04-11 ENCOUNTER — Emergency Department (HOSPITAL_COMMUNITY): Payer: Medicare Other

## 2018-04-11 ENCOUNTER — Other Ambulatory Visit: Payer: Self-pay

## 2018-04-11 DIAGNOSIS — Y999 Unspecified external cause status: Secondary | ICD-10-CM | POA: Insufficient documentation

## 2018-04-11 DIAGNOSIS — I10 Essential (primary) hypertension: Secondary | ICD-10-CM | POA: Diagnosis not present

## 2018-04-11 DIAGNOSIS — S0990XA Unspecified injury of head, initial encounter: Secondary | ICD-10-CM | POA: Insufficient documentation

## 2018-04-11 DIAGNOSIS — W19XXXA Unspecified fall, initial encounter: Secondary | ICD-10-CM | POA: Diagnosis not present

## 2018-04-11 DIAGNOSIS — R52 Pain, unspecified: Secondary | ICD-10-CM | POA: Diagnosis not present

## 2018-04-11 DIAGNOSIS — Y9301 Activity, walking, marching and hiking: Secondary | ICD-10-CM | POA: Insufficient documentation

## 2018-04-11 DIAGNOSIS — W010XXA Fall on same level from slipping, tripping and stumbling without subsequent striking against object, initial encounter: Secondary | ICD-10-CM | POA: Diagnosis not present

## 2018-04-11 DIAGNOSIS — M542 Cervicalgia: Secondary | ICD-10-CM | POA: Diagnosis not present

## 2018-04-11 DIAGNOSIS — Z1231 Encounter for screening mammogram for malignant neoplasm of breast: Secondary | ICD-10-CM | POA: Diagnosis not present

## 2018-04-11 DIAGNOSIS — Y929 Unspecified place or not applicable: Secondary | ICD-10-CM | POA: Diagnosis not present

## 2018-04-11 DIAGNOSIS — S199XXA Unspecified injury of neck, initial encounter: Secondary | ICD-10-CM | POA: Diagnosis not present

## 2018-04-11 MED ORDER — ACETAMINOPHEN 500 MG PO TABS
1000.0000 mg | ORAL_TABLET | Freq: Once | ORAL | Status: AC
  Administered 2018-04-11: 1000 mg via ORAL
  Filled 2018-04-11: qty 2

## 2018-04-11 NOTE — ED Provider Notes (Signed)
Emergency Department Provider Note   I have reviewed the triage vital signs and the nursing notes.   HISTORY  Chief Complaint Fall   HPI Valerie Barker is a 85 y.o. female with history as documented below the presents emergency department today after mechanical fall.  Patient states that she stepped off of a high curb and only had a cane she states as soon as she transferred to that foot she lost balance fell backwards hitting the back of her head and is now having some posterior head and neck pain.  No syncope.  No extremity pain chest pain, back pain or abdominal pain at this time. No other associated or modifying symptoms.    Past Medical History:  Diagnosis Date  . Heart disease   . Hypertension   . IBS (irritable bowel syndrome)   . Stroke ([place])   . TIA (transient ischemic attack)     Patient Active Problem List   Diagnosis Date Noted  . Restless leg syndrome 08/26/2015  . Hyperlipidemia 08/26/2015  . Essential hypertension 02/06/2015  . Dizziness 02/06/2015  . Sinus bradycardia 02/06/2015  . Lower extremity edema 08/05/2014  . Short-term memory loss 02/01/2014  . Palpitation 06/03/2013  . TIA (transient ischemic attack) 01/28/2013  . Dyslipidemia 01/28/2013  . HTN (hypertension) 01/28/2013  . Arm numbness 01/28/2013    Past Surgical History:  Procedure Laterality Date  . ABDOMINAL HYSTERECTOMY  1978  . BUNIONECTOMY  2006, 1996  . LAPAROSCOPIC SALPINGO OOPHERECTOMY  1988    Current Outpatient Rx  . Order #: [id number] Class: Normal  . Order #: [id number] Class: Historical Med  . Order #: [id number] Class: Historical Med  . Order #: [id number] Class: Historical Med  . Order #: [id number] Class: Historical Med  . Order #: [id number] Class: Historical Med  . Order #: [id number] Class: Historical Med  . Order #: [id number] Class: Historical Med  . Order #: [id number] Class: Historical Med  . Order #: [id number] Class: Historical Med    Allergies Duloxetine;  Erythromycin; Mirtazapine; Trazodone; Zolpidem; Cyclobenzaprine; and Diazepam  Family History  Problem Relation Age of Onset  . COPD Mother   . Schizophrenia Brother   . Stroke Father   . Breast cancer Neg Hx     Social History Social History   Tobacco Use  . Smoking status: Never Smoker  . Smokeless tobacco: Never Used  Substance Use Topics  . Alcohol use: No  . Drug use: No    Review of Systems  All other systems negative except as documented in the HPI. All pertinent positives and negatives as reviewed in the HPI. ____________________________________________   PHYSICAL EXAM:  VITAL SIGNS: ED Triage Vitals  Enc Vitals Group     BP 04/11/18 1300 (!) 182/77     Pulse Rate 04/11/18 1300 66     Resp 04/11/18 1301 16     Temp 04/11/18 1301 98.2 F (36.8 C)     Temp Source 04/11/18 1301 Oral     SpO2 04/11/18 1254 100 %     Weight 04/11/18 1258 124 lb (56.2 kg)     Height 04/11/18 1258 4' 11.5" (1.511 m)    Constitutional: Alert and oriented. Well appearing and in no acute distress. Eyes: Conjunctivae are normal. PERRL. EOMI. Head: contusion posterior scalp and neck. Nose: No congestion/rhinnorhea. Mouth/Throat: Mucous membranes are moist.  Oropharynx non-erythematous. Neck: No stridor.  No meningeal signs.   Cardiovascular: Normal rate, regular rhythm. Good peripheral circulation. Grossly normal heart sounds.   Respiratory: Normal respiratory effort.  No retractions.  Lungs CTAB. Gastrointestinal: Soft and nontender. No distention.  Musculoskeletal: No lower extremity tenderness nor edema. No gross deformities of extremities. Neurologic:  Normal speech and language. No gross focal neurologic deficits are appreciated.  Skin:  Skin is warm, dry and intact. No rash noted.   ____________________________________________   RADIOLOGY  Ct Head Wo Contrast  Result Date: 04/11/2018 CLINICAL DATA:  85 year old female with history of trauma from a fall backwards when  leaving the doctor's office. Injury to the back of the head. No loss of consciousness. EXAM: CT HEAD WITHOUT CONTRAST CT CERVICAL SPINE WITHOUT CONTRAST TECHNIQUE: Multidetector CT imaging of the head and cervical spine was performed following the standard protocol without intravenous contrast. Multiplanar CT image reconstructions of the cervical spine were also generated. COMPARISON:  Head CT and cervical spine CT 08/01/2017. FINDINGS: CT HEAD FINDINGS Brain: Patchy and confluent areas of decreased attenuation are noted throughout the deep and periventricular white matter of the cerebral hemispheres bilaterally, compatible with chronic microvascular ischemic disease. Physiologic calcifications in the right basal ganglia. No evidence of acute infarction, hemorrhage, hydrocephalus, extra-axial collection or mass lesion/mass effect. Vascular: No hyperdense vessel or unexpected calcification. Skull: Normal. Negative for fracture or focal lesion. Sinuses/Orbits: No acute finding. Other: None. CT CERVICAL SPINE FINDINGS Alignment: Normal. Skull base and vertebrae: No acute fracture. No primary bone lesion or focal pathologic process. Soft tissues and spinal canal: No prevertebral fluid or swelling. No visible canal hematoma. Disc levels: Mild multilevel degenerative disc disease, most severe at C4-C5. Mild multilevel facet arthropathy. Upper chest: Mild scarring in the lung apices. Other: None. IMPRESSION: 1. No evidence of significant acute traumatic injury to the skull, brain or cervical spine. 2. Extensive chronic microvascular ischemic changes in the cerebral white matter redemonstrated, as above. 3. Very mild multilevel degenerative disc disease and cervical spondylosis. Electronically Signed   By: Vinnie Langton M.D.   On: 04/11/2018 15:24   Ct Cervical Spine Wo Contrast  Result Date: 04/11/2018 CLINICAL DATA:  85 year old female with history of trauma from a fall backwards when leaving the doctor's office.  Injury to the back of the head. No loss of consciousness. EXAM: CT HEAD WITHOUT CONTRAST CT CERVICAL SPINE WITHOUT CONTRAST TECHNIQUE: Multidetector CT imaging of the head and cervical spine was performed following the standard protocol without intravenous contrast. Multiplanar CT image reconstructions of the cervical spine were also generated. COMPARISON:  Head CT and cervical spine CT 08/01/2017. FINDINGS: CT HEAD FINDINGS Brain: Patchy and confluent areas of decreased attenuation are noted throughout the deep and periventricular white matter of the cerebral hemispheres bilaterally, compatible with chronic microvascular ischemic disease. Physiologic calcifications in the right basal ganglia. No evidence of acute infarction, hemorrhage, hydrocephalus, extra-axial collection or mass lesion/mass effect. Vascular: No hyperdense vessel or unexpected calcification. Skull: Normal. Negative for fracture or focal lesion. Sinuses/Orbits: No acute finding. Other: None. CT CERVICAL SPINE FINDINGS Alignment: Normal. Skull base and vertebrae: No acute fracture. No primary bone lesion or focal pathologic process. Soft tissues and spinal canal: No prevertebral fluid or swelling. No visible canal hematoma. Disc levels: Mild multilevel degenerative disc disease, most severe at C4-C5. Mild multilevel facet arthropathy. Upper chest: Mild scarring in the lung apices. Other: None. IMPRESSION: 1. No evidence of significant acute traumatic injury to the skull, brain or cervical spine. 2. Extensive chronic microvascular ischemic changes in the cerebral white matter redemonstrated, as above. 3. Very mild multilevel degenerative disc disease and cervical spondylosis. Electronically Signed   By: Mauri Brooklyn.D.  On: 04/11/2018 15:24   Mm 3d Screen Breast Bilateral  Result Date: 04/11/2018 CLINICAL DATA:  Screening. EXAM: DIGITAL SCREENING BILATERAL MAMMOGRAM WITH TOMO AND CAD COMPARISON:  Previous exam(s). ACR Breast Density  Category b: There are scattered areas of fibroglandular density. FINDINGS: There are no findings suspicious for malignancy. Images were processed with CAD. IMPRESSION: No mammographic evidence of malignancy. A result letter of this screening mammogram will be mailed directly to the patient. RECOMMENDATION: Screening mammogram in one year. (Code:SM-B-01Y) BI-RADS CATEGORY  1: Negative. Electronically Signed   By: Lajean Manes M.D.   On: 04/11/2018 13:22    ____________________________________________   INITIAL IMPRESSION / ASSESSMENT AND PLAN / ED COURSE  Workup negative. Will keep log of BP's and fu w/ PCP in 1 week. Concussion precautions given.      Pertinent labs & imaging results that were available during my care of the patient were reviewed by me and considered in my medical decision making (see chart for details).  ____________________________________________  FINAL CLINICAL IMPRESSION(S) / ED DIAGNOSES  Final diagnoses:  Hypertension, unspecified type  Fall, initial encounter     MEDICATIONS GIVEN DURING THIS VISIT:  Medications  acetaminophen (TYLENOL) tablet 1,000 mg (1,000 mg Oral Given 04/11/18 1522)     NEW OUTPATIENT MEDICATIONS STARTED DURING THIS VISIT:  Discharge Medication List as of 04/11/2018  3:51 PM      Note:  This note was prepared with assistance of Dragon voice recognition software. Occasional wrong-word or sound-a-like substitutions may have occurred due to the inherent limitations of voice recognition software.   Merrily Pew, MD 04/11/18 1630

## 2018-04-11 NOTE — ED Triage Notes (Signed)
PT fell backward when leaving PCP office. Pt hit back LOC  head but no LOC. Pt does take Plavix. PT AO on arrival to ED . C collar inplace

## 2018-04-11 NOTE — Discharge Instructions (Signed)
Please keep track of your blood pressures for 2-3 times a day until you see your primary doctor and see if any medication adjustments need to be made.  If you develop any severe headache, vision changes, chest pain, shortness of breath or lower extremity swelling with your high blood pressure please return to the emergency department for further evaluation.

## 2018-04-12 DIAGNOSIS — H8113 Benign paroxysmal vertigo, bilateral: Secondary | ICD-10-CM | POA: Diagnosis not present

## 2018-04-12 DIAGNOSIS — M545 Low back pain: Secondary | ICD-10-CM | POA: Diagnosis not present

## 2018-04-12 DIAGNOSIS — M6281 Muscle weakness (generalized): Secondary | ICD-10-CM | POA: Diagnosis not present

## 2018-04-12 DIAGNOSIS — R260 Ataxic gait: Secondary | ICD-10-CM | POA: Diagnosis not present

## 2018-04-17 ENCOUNTER — Emergency Department (HOSPITAL_COMMUNITY): Payer: Medicare Other

## 2018-04-17 ENCOUNTER — Observation Stay (HOSPITAL_COMMUNITY): Payer: Medicare Other

## 2018-04-17 ENCOUNTER — Encounter (HOSPITAL_COMMUNITY): Payer: Self-pay

## 2018-04-17 ENCOUNTER — Observation Stay (HOSPITAL_COMMUNITY)
Admission: EM | Admit: 2018-04-17 | Discharge: 2018-04-19 | Disposition: A | Discharge status: Home / Self Care | Payer: Medicare Other | Attending: Internal Medicine | Admitting: Internal Medicine

## 2018-04-17 DIAGNOSIS — R338 Other retention of urine: Secondary | ICD-10-CM | POA: Diagnosis present

## 2018-04-17 DIAGNOSIS — G459 Transient cerebral ischemic attack, unspecified: Secondary | ICD-10-CM

## 2018-04-17 DIAGNOSIS — Z7902 Long term (current) use of antithrombotics/antiplatelets: Secondary | ICD-10-CM | POA: Diagnosis not present

## 2018-04-17 DIAGNOSIS — R531 Weakness: Secondary | ICD-10-CM | POA: Diagnosis not present

## 2018-04-17 DIAGNOSIS — I10 Essential (primary) hypertension: Secondary | ICD-10-CM | POA: Diagnosis not present

## 2018-04-17 DIAGNOSIS — I639 Cerebral infarction, unspecified: Secondary | ICD-10-CM

## 2018-04-17 DIAGNOSIS — R29818 Other symptoms and signs involving the nervous system: Secondary | ICD-10-CM | POA: Diagnosis not present

## 2018-04-17 DIAGNOSIS — R29810 Facial weakness: Secondary | ICD-10-CM | POA: Diagnosis not present

## 2018-04-17 DIAGNOSIS — R4781 Slurred speech: Secondary | ICD-10-CM | POA: Diagnosis not present

## 2018-04-17 DIAGNOSIS — Z79899 Other long term (current) drug therapy: Secondary | ICD-10-CM | POA: Insufficient documentation

## 2018-04-17 DIAGNOSIS — I63011 Cerebral infarction due to thrombosis of right vertebral artery: Secondary | ICD-10-CM | POA: Diagnosis not present

## 2018-04-17 DIAGNOSIS — R413 Other amnesia: Secondary | ICD-10-CM | POA: Diagnosis present

## 2018-04-17 DIAGNOSIS — S0990XA Unspecified injury of head, initial encounter: Secondary | ICD-10-CM | POA: Diagnosis not present

## 2018-04-17 DIAGNOSIS — E785 Hyperlipidemia, unspecified: Secondary | ICD-10-CM | POA: Insufficient documentation

## 2018-04-17 DIAGNOSIS — Z8673 Personal history of transient ischemic attack (TIA), and cerebral infarction without residual deficits: Secondary | ICD-10-CM | POA: Diagnosis present

## 2018-04-17 HISTORY — DX: Cerebral infarction, unspecified: I63.9

## 2018-04-17 HISTORY — DX: Other retention of urine: R33.8

## 2018-04-17 LAB — COMPREHENSIVE METABOLIC PANEL
ALT: 21 U/L (ref 0–44)
AST: 23 U/L (ref 15–41)
Albumin: 3.9 g/dL (ref 3.5–5.0)
Alkaline Phosphatase: 51 U/L (ref 38–126)
Anion gap: 9 (ref 5–15)
BUN: 14 mg/dL (ref 8–23)
CO2: 25 mmol/L (ref 22–32)
Calcium: 9.3 mg/dL (ref 8.9–10.3)
Chloride: 105 mmol/L (ref 98–111)
Creatinine, Ser: 0.89 mg/dL (ref 0.44–1.00)
GFR calc Af Amer: >60 mL/min (ref >60)
GFR calc non Af Amer: 56 mL/min — ABNORMAL LOW (ref >60)
Glucose, Bld: 105 mg/dL — ABNORMAL HIGH (ref 70–99)
Potassium: 4.2 mmol/L (ref 3.5–5.1)
Sodium: 139 mmol/L (ref 135–145)
Total Bilirubin: 0.8 mg/dL (ref 0.3–1.2)
Total Protein: 6.1 g/dL — ABNORMAL LOW (ref 6.5–8.1)

## 2018-04-17 LAB — I-STAT CHEM 8, ED
BUN: 16 mg/dL (ref 8–23)
CHLORIDE: 104 mmol/L (ref 98–111)
Calcium, Ion: 1.07 mmol/L — ABNORMAL LOW (ref 1.15–1.40)
Creatinine, Ser: 0.8 mg/dL (ref 0.44–1.00)
Glucose, Bld: 101 mg/dL — ABNORMAL HIGH (ref 70–99)
HCT: 38 % (ref 36.0–46.0)
Hemoglobin: 12.9 g/dL (ref 12.0–15.0)
Potassium: 4.1 mmol/L (ref 3.5–5.1)
Sodium: 138 mmol/L (ref 135–145)
TCO2: 26 mmol/L (ref 22–32)

## 2018-04-17 LAB — URINALYSIS, ROUTINE W REFLEX MICROSCOPIC
Bilirubin Urine: NEGATIVE
Glucose, UA: NEGATIVE mg/dL
Ketones, ur: NEGATIVE mg/dL
Nitrite: NEGATIVE
Protein, ur: NEGATIVE mg/dL
Specific Gravity, Urine: 1.009 (ref 1.005–1.030)
pH: 7 (ref 5.0–8.0)

## 2018-04-17 LAB — CBC
HCT: 39.4 % (ref 36.0–46.0)
Hemoglobin: 12.5 g/dL (ref 12.0–15.0)
MCH: 30 pg (ref 26.0–34.0)
MCHC: 31.7 g/dL (ref 30.0–36.0)
MCV: 94.7 fL (ref 80.0–100.0)
Platelets: 163 10*3/uL (ref 150–400)
RBC: 4.16 MIL/uL (ref 3.87–5.11)
RDW: 14.2 % (ref 11.5–15.5)
WBC: 6.6 10*3/uL (ref 4.0–10.5)
nRBC: 0 % (ref 0.0–0.2)

## 2018-04-17 LAB — DIFFERENTIAL
ABS IMMATURE GRANULOCYTES: 0.01 10*3/uL (ref 0.00–0.07)
BASOS ABS: 0 10*3/uL (ref 0.0–0.1)
Basophils Relative: 1 %
Eosinophils Absolute: 0.1 10*3/uL (ref 0.0–0.5)
Eosinophils Relative: 2 %
Immature Granulocytes: 0 %
Lymphocytes Relative: 22 %
Lymphs Abs: 1.4 10*3/uL (ref 0.7–4.0)
Monocytes Absolute: 0.6 10*3/uL (ref 0.1–1.0)
Monocytes Relative: 9 %
Neutro Abs: 4.4 10*3/uL (ref 1.7–7.7)
Neutrophils Relative %: 66 %

## 2018-04-17 LAB — RAPID URINE DRUG SCREEN, HOSP PERFORMED
Amphetamines: NOT DETECTED
Barbiturates: NOT DETECTED
Benzodiazepines: NOT DETECTED
Cocaine: NOT DETECTED
Opiates: NOT DETECTED
Tetrahydrocannabinol: NOT DETECTED

## 2018-04-17 LAB — I-STAT TROPONIN, ED: Troponin i, poc: 0 ng/mL (ref 0.00–0.08)

## 2018-04-17 LAB — PROTIME-INR
INR: 1.05
Prothrombin Time: 13.6 seconds (ref 11.4–15.2)

## 2018-04-17 LAB — APTT: aPTT: 20 seconds — ABNORMAL LOW (ref 24–36)

## 2018-04-17 LAB — ETHANOL: Alcohol, Ethyl (B): <10 mg/dL (ref <10)

## 2018-04-17 MED ORDER — ATORVASTATIN CALCIUM 10 MG PO TABS
5.0000 mg | ORAL_TABLET | Freq: Every evening | ORAL | Status: DC
  Administered 2018-04-17 – 2018-04-18 (×2): 5 mg via ORAL
  Filled 2018-04-17 (×2): qty 1

## 2018-04-17 MED ORDER — ASPIRIN 325 MG PO TABS: 325.0000 mg | ORAL_TABLET | Freq: Every day | ORAL | Status: DC

## 2018-04-17 MED ORDER — SODIUM CHLORIDE 0.9 % IV SOLN
INTRAVENOUS | Status: AC
  Administered 2018-04-17: 21:00:00 via INTRAVENOUS

## 2018-04-17 MED ORDER — ONDANSETRON HCL 4 MG/2ML IJ SOLN
4.0000 mg | Freq: Four times a day (QID) | INTRAMUSCULAR | Status: DC | PRN
  Administered 2018-04-17 – 2018-04-18 (×2): 4 mg via INTRAVENOUS
  Filled 2018-04-17 (×3): qty 2

## 2018-04-17 MED ORDER — ASPIRIN 300 MG RE SUPP: 300.0000 mg | Freq: Every day | RECTAL | Status: DC

## 2018-04-17 MED ORDER — ADULT MULTIVITAMIN W/MINERALS CH
1.0000 | ORAL_TABLET | Freq: Every day | ORAL | Status: DC
  Administered 2018-04-18 – 2018-04-19 (×2): 1 via ORAL
  Filled 2018-04-17 (×2): qty 1

## 2018-04-17 MED ORDER — STROKE: EARLY STAGES OF RECOVERY BOOK: Freq: Once | Status: DC

## 2018-04-17 MED ORDER — HEPARIN SODIUM (PORCINE) 5000 UNIT/ML IJ SOLN
5000.0000 [IU] | Freq: Three times a day (TID) | INTRAMUSCULAR | Status: DC
  Administered 2018-04-17 – 2018-04-19 (×6): 5000 [IU] via SUBCUTANEOUS
  Filled 2018-04-17 (×6): qty 1

## 2018-04-17 MED ORDER — SENNOSIDES-DOCUSATE SODIUM 8.6-50 MG PO TABS
1.0000 | ORAL_TABLET | Freq: Every evening | ORAL | Status: DC | PRN
  Administered 2018-04-18: 1 via ORAL
  Filled 2018-04-17: qty 1

## 2018-04-17 MED ORDER — GABAPENTIN 100 MG PO CAPS
100.0000 mg | ORAL_CAPSULE | Freq: Every day | ORAL | Status: DC
  Administered 2018-04-17 – 2018-04-18 (×2): 100 mg via ORAL
  Filled 2018-04-17 (×2): qty 1

## 2018-04-17 MED ORDER — ACETAMINOPHEN 650 MG RE SUPP: 650.0000 mg | RECTAL | Status: DC | PRN

## 2018-04-17 MED ORDER — ACETAMINOPHEN 325 MG PO TABS
650.0000 mg | ORAL_TABLET | ORAL | Status: DC | PRN
  Administered 2018-04-17: 650 mg via ORAL
  Filled 2018-04-17 (×2): qty 2

## 2018-04-17 MED ORDER — IOPAMIDOL (ISOVUE-370) INJECTION 76%
75.0000 mL | Freq: Once | INTRAVENOUS | Status: AC | PRN
  Administered 2018-04-17: 75 mL via INTRAVENOUS

## 2018-04-17 MED ORDER — ROPINIROLE HCL 1 MG PO TABS
0.5000 mg | ORAL_TABLET | Freq: Every day | ORAL | Status: DC
  Administered 2018-04-17 – 2018-04-18 (×2): 0.5 mg via ORAL
  Filled 2018-04-17 (×2): qty 1

## 2018-04-17 MED ORDER — MULTIVITAMINS PO CAPS: 1.0000 | ORAL_CAPSULE | ORAL | Status: DC

## 2018-04-17 MED ORDER — CLOPIDOGREL BISULFATE 75 MG PO TABS
75.0000 mg | ORAL_TABLET | Freq: Every day | ORAL | Status: DC
  Administered 2018-04-17 – 2018-04-19 (×3): 75 mg via ORAL
  Filled 2018-04-17 (×3): qty 1

## 2018-04-17 MED ORDER — ROPINIROLE HCL 1 MG PO TABS
1.0000 mg | ORAL_TABLET | Freq: Every day | ORAL | Status: DC
  Administered 2018-04-17 – 2018-04-18 (×2): 1 mg via ORAL
  Filled 2018-04-17 (×2): qty 1

## 2018-04-17 MED ORDER — ACETAMINOPHEN 160 MG/5ML PO SOLN: 650.0000 mg | ORAL | Status: DC | PRN

## 2018-04-17 MED ORDER — VITAMIN B-12 1000 MCG PO TABS
2500.0000 ug | ORAL_TABLET | ORAL | Status: DC
  Administered 2018-04-18 – 2018-04-19 (×2): 2500 ug via ORAL
  Filled 2018-04-17: qty 5
  Filled 2018-04-17: qty 2

## 2018-04-17 NOTE — ED Notes (Signed)
Pt returned from CT, neuro at bedside

## 2018-04-17 NOTE — Progress Notes (Signed)
Valerie Barker was brought down for MRI @ 19:30 04/17/18 .  She was unable to hold still for any imaging/ Grabbing the coil rolled on her side and wanted to move and get out of scanner.  No meds available at this tim. Sent back to room without Mri scan/

## 2018-04-17 NOTE — ED Provider Notes (Signed)
[facility] EMERGENCY DEPARTMENT Provider Note   CSN: [id number] Arrival date & time: 04/17/18  1146     History   Chief Complaint Chief Complaint  Patient presents with  . Code Stroke    HPI Valerie Barker is a 85 y.o. female.  HPI Patient was in normal state of health when she went to sleep yesterday evening at 11 PM.  This was last seen normal.  She woke at 1030 and daughter noticed patient was confused.  Having trouble telling time.  Appeared to have right-sided lower facial droop.  Seemed off balance.  No speech changes.  Patient denies any pain.  Patient is on Plavix. Past Medical History:  Diagnosis Date  . Heart disease   . Hypertension   . IBS (irritable bowel syndrome)   . Stroke ([place])    around 2011  . TIA (transient ischemic attack)     Patient Active Problem List   Diagnosis Date Noted  . Restless leg syndrome 08/26/2015  . Hyperlipidemia 08/26/2015  . Essential hypertension 02/06/2015  . Dizziness 02/06/2015  . Sinus bradycardia 02/06/2015  . Lower extremity edema 08/05/2014  . Short-term memory loss 02/01/2014  . Palpitation 06/03/2013  . TIA (transient ischemic attack) 01/28/2013  . Dyslipidemia 01/28/2013  . HTN (hypertension) 01/28/2013  . Arm numbness 01/28/2013    Past Surgical History:  Procedure Laterality Date  . ABDOMINAL HYSTERECTOMY  1978  . BUNIONECTOMY  2006, 1996  . LAPAROSCOPIC SALPINGO OOPHERECTOMY  1988     OB History   No obstetric history on file.      Home Medications    Prior to Admission medications   Medication Sig Start Date End Date Taking? Authorizing Provider  atorvastatin (LIPITOR) 10 MG tablet TAKE 1/2 TABLET EVERY DAY. 06/22/17   Troy Sine, MD  Biotin 5000 MCG CAPS Take 1 capsule by mouth daily.    [provider]  Cholecalciferol (VITAMIN D) 2000 UNITS CAPS Take 1 capsule by mouth daily.    [provider]  clopidogrel (PLAVIX) 75 MG tablet Take 75 mg by  mouth daily.    [provider]  Cyanocobalamin (VITAMIN B-12 PO) Take 2,500 mcg by mouth daily.    [provider]  gabapentin (NEURONTIN) 100 MG capsule Take 1 capsule by mouth daily. 12/30/13   [provider]  hydrochlorothiazide (HYDRODIURIL) 25 MG tablet  10/25/17   [provider]  Multiple Vitamin (MULTIVITAMIN) capsule Take 1 capsule by mouth daily.    [provider]  rOPINIRole (REQUIP) 0.5 MG tablet Take by mouth at bedtime. 2 tabs at bedtime, and takes 1/2 tab in afternoon prn 07/04/14   [provider]  valsartan (DIOVAN) 320 MG tablet  11/15/17   [provider]    Family History Family History  Problem Relation Age of Onset  . COPD Mother   . Schizophrenia Brother   . Stroke Father   . Breast cancer Neg Hx     Social History Social History   Tobacco Use  . Smoking status: Never Smoker  . Smokeless tobacco: Never Used  Substance Use Topics  . Alcohol use: No  . Drug use: No     Allergies   Duloxetine; Erythromycin; Mirtazapine; Trazodone; Zolpidem; Cyclobenzaprine; and Diazepam   Review of Systems Review of Systems  Constitutional: Negative for chills and fever.  HENT: Negative for facial swelling and trouble swallowing.   Eyes: Positive for visual disturbance.  Respiratory: Negative for cough and shortness of  breath.   Cardiovascular: Negative for chest pain.  Gastrointestinal: Negative for abdominal pain, constipation, diarrhea, nausea and vomiting.  Musculoskeletal: Positive for gait problem. Negative for back pain, myalgias and neck pain.  Skin: Negative for rash and wound.  Neurological: Positive for weakness. Negative for speech difficulty, light-headedness and headaches.  All other systems reviewed and are negative.    Physical Exam Updated Vital Signs BP (!) 141/97   Pulse 66   Temp 98.3 F (36.8 C) (Oral)   Resp 15   SpO2 98%   Physical Exam Vitals signs and nursing note  reviewed.  Constitutional:      General: She is not in acute distress.    Appearance: Normal appearance. She is well-developed. She is not ill-appearing.  HENT:     Head: Normocephalic and atraumatic.     Comments: Mild right lower facial droop. Eyes:     Extraocular Movements: Extraocular movements intact.     Pupils: Pupils are equal, round, and reactive to light.     Comments: Appears to have left sided visual field deficit in both eyes  Neck:     Musculoskeletal: Normal range of motion and neck supple. No neck rigidity or muscular tenderness.  Cardiovascular:     Rate and Rhythm: Normal rate and regular rhythm.     Heart sounds: No murmur. No friction rub. No gallop.   Pulmonary:     Effort: Pulmonary effort is normal. No respiratory distress.     Breath sounds: Normal breath sounds. No stridor. No wheezing, rhonchi or rales.  Abdominal:     General: Bowel sounds are normal.     Palpations: Abdomen is soft.     Tenderness: There is no abdominal tenderness. There is no guarding or rebound.  Musculoskeletal: Normal range of motion.        General: No swelling, tenderness or deformity.     Right lower leg: No edema.     Left lower leg: No edema.  Lymphadenopathy:     Cervical: No cervical adenopathy.  Skin:    General: Skin is warm and dry.     Capillary Refill: Capillary refill takes less than 2 seconds.     Findings: No erythema or rash.  Neurological:     Mental Status: She is alert and oriented to person, place, and time.     Comments: Left hand neglect.  Question mild left hip strength weakness compared to right.  4/5 left lower extremity.  5/5 right lower extremity.  Psychiatric:        Mood and Affect: Mood normal.        Behavior: Behavior normal.      ED Treatments / Results  Labs (all labs ordered are listed, but only abnormal results are displayed) Labs Reviewed  APTT - Abnormal; Notable for the following components:      Result Value   aPTT 20 (*)    All  other components within normal limits  COMPREHENSIVE METABOLIC PANEL - Abnormal; Notable for the following components:   Glucose, Bld 105 (*)    Total Protein 6.1 (*)    GFR calc non Af Amer 56 (*)    All other components within normal limits  I-STAT CHEM 8, ED - Abnormal; Notable for the following components:   Glucose, Bld 101 (*)    Calcium, Ion 1.07 (*)    All other components within normal limits  ETHANOL  PROTIME-INR  CBC  DIFFERENTIAL  RAPID URINE DRUG SCREEN, HOSP PERFORMED  URINALYSIS,  ROUTINE W REFLEX MICROSCOPIC  I-STAT TROPONIN, ED    EKG EKG Interpretation  Date/Time:  Tuesday April 17 2018 11:57:14 EST Ventricular Rate:  69 PR Interval:    QRS Duration: 95 QT Interval:  402 QTC Calculation: 431 R Axis:   26 Text Interpretation:  Sinus rhythm Borderline T wave abnormalities Confirmed by Julianne Rice [phone]) on 04/17/2018 12:42:47 PM   Radiology Ct Angio Head W Or Wo Contrast  Result Date: 04/17/2018 CLINICAL DATA:  Last seen normal 2300 hours. Left-sided weakness. Right-sided facial droop. EXAM: CT ANGIOGRAPHY HEAD AND NECK TECHNIQUE: Multidetector CT imaging of the head and neck was performed using the standard protocol during bolus administration of intravenous contrast. Multiplanar CT image reconstructions and MIPs were obtained to evaluate the vascular anatomy. Carotid stenosis measurements (when applicable) are obtained utilizing NASCET criteria, using the distal internal carotid diameter as the denominator. CONTRAST:  86mL ISOVUE-370 IOPAMIDOL (ISOVUE-370) INJECTION 76% COMPARISON:  CT earlier same day. FINDINGS: CTA NECK FINDINGS Aortic arch: Aortic atherosclerosis. No aneurysm or dissection. Branching pattern is normal without origin stenosis. Brachiocephalic vessels are tortuous. Right carotid system: Common carotid artery widely patent to the bifurcation. Minimal atherosclerotic plaque at the carotid bifurcation but no stenosis. Cervical ICA is widely  patent, though tortuous. Left carotid system: Common carotid artery widely patent to the bifurcation. Carotid bifurcation shows minimal atherosclerotic plaque. No stenosis or irregularity. Cervical ICA is tortuous but widely patent. Vertebral arteries: Both vertebral artery origins are widely patent. The vertebral arteries are widely patent through the cervical region to the foramen magnum. Skeleton: Normal Other neck: No mass or lymphadenopathy. Upper chest: Mild pleural and parenchymal scarring at the apices. Review of the MIP images confirms the above findings CTA HEAD FINDINGS Anterior circulation: Both internal carotid arteries are patent through the skull base and siphon regions. There is siphon calcification but no stenosis. The anterior and middle cerebral vessels are patent without proximal stenosis, aneurysm or vascular malformation. No large or medium vessel occlusion. Posterior circulation: Both vertebral arteries are widely patent to the basilar. No basilar stenosis. Posterior circulation branch vessels are patent. Patent posterior communicating arteries on each side. Venous sinuses: Patent and normal. Anatomic variants: None significant. Delayed phase: No abnormal enhancement. Review of the MIP images confirms the above findings IMPRESSION: No large or medium vessel occlusion or correctable proximal stenosis. These results were called by telephone at the time of interpretation on 04/17/2018 at 1:18 pm to Dr. Ray Church , who verbally acknowledged these results. Electronically Signed   By: Nelson Chimes M.D.   On: 04/17/2018 13:19   Ct Angio Neck W Or Wo Contrast  Result Date: 04/17/2018 CLINICAL DATA:  Last seen normal 2300 hours. Left-sided weakness. Right-sided facial droop. EXAM: CT ANGIOGRAPHY HEAD AND NECK TECHNIQUE: Multidetector CT imaging of the head and neck was performed using the standard protocol during bolus administration of intravenous contrast. Multiplanar CT image reconstructions  and MIPs were obtained to evaluate the vascular anatomy. Carotid stenosis measurements (when applicable) are obtained utilizing NASCET criteria, using the distal internal carotid diameter as the denominator. CONTRAST:  53mL ISOVUE-370 IOPAMIDOL (ISOVUE-370) INJECTION 76% COMPARISON:  CT earlier same day. FINDINGS: CTA NECK FINDINGS Aortic arch: Aortic atherosclerosis. No aneurysm or dissection. Branching pattern is normal without origin stenosis. Brachiocephalic vessels are tortuous. Right carotid system: Common carotid artery widely patent to the bifurcation. Minimal atherosclerotic plaque at the carotid bifurcation but no stenosis. Cervical ICA is widely patent, though tortuous. Left carotid system: Common carotid artery widely  patent to the bifurcation. Carotid bifurcation shows minimal atherosclerotic plaque. No stenosis or irregularity. Cervical ICA is tortuous but widely patent. Vertebral arteries: Both vertebral artery origins are widely patent. The vertebral arteries are widely patent through the cervical region to the foramen magnum. Skeleton: Normal Other neck: No mass or lymphadenopathy. Upper chest: Mild pleural and parenchymal scarring at the apices. Review of the MIP images confirms the above findings CTA HEAD FINDINGS Anterior circulation: Both internal carotid arteries are patent through the skull base and siphon regions. There is siphon calcification but no stenosis. The anterior and middle cerebral vessels are patent without proximal stenosis, aneurysm or vascular malformation. No large or medium vessel occlusion. Posterior circulation: Both vertebral arteries are widely patent to the basilar. No basilar stenosis. Posterior circulation branch vessels are patent. Patent posterior communicating arteries on each side. Venous sinuses: Patent and normal. Anatomic variants: None significant. Delayed phase: No abnormal enhancement. Review of the MIP images confirms the above findings IMPRESSION: No large  or medium vessel occlusion or correctable proximal stenosis. These results were called by telephone at the time of interpretation on 04/17/2018 at 1:18 pm to Dr. Ray Church , who verbally acknowledged these results. Electronically Signed   By: Nelson Chimes M.D.   On: 04/17/2018 13:19   Ct Head Code Stroke Wo Contrast  Result Date: 04/17/2018 CLINICAL DATA:  Code stroke. Last seen normal 2300 hours. Left-sided weakness. Right-sided facial droop. EXAM: CT HEAD WITHOUT CONTRAST TECHNIQUE: Contiguous axial images were obtained from the base of the skull through the vertex without intravenous contrast. COMPARISON:  04/11/2018 FINDINGS: Brain: Advanced chronic small-vessel ischemic changes throughout the brain. No sign of acute infarction, mass lesion, hemorrhage, hydrocephalus or extra-axial collection. Vascular: There is atherosclerotic calcification of the major vessels at the base of the brain. Skull: Negative Sinuses/Orbits: Clear/normal Other: None ASPECTS ([place] Stroke Program Early CT Score) - Ganglionic level infarction (caudate, lentiform nuclei, internal capsule, insula, M1-M3 cortex): 7 - Supraganglionic infarction (M4-M6 cortex): 3 Total score (0-10 with 10 being normal): 10 IMPRESSION: 1. No acute finding by CT. Advanced chronic small-vessel ischemic changes throughout the brain. 2. ASPECTS is 10. * These results were called by telephone at the time of interpretation on 04/17/2018 at 1:02 pm to Dr. Marcelle Overlie , who verbally acknowledged these results. Electronically Signed   By: Nelson Chimes M.D.   On: 04/17/2018 13:03    Procedures Procedures (including critical care time)  Medications Ordered in ED Medications  iopamidol (ISOVUE-370) 76 % injection 75 mL (75 mLs Intravenous Contrast Given 04/17/18 1256)     Initial Impression / Assessment and Plan / ED Course  I have reviewed the triage vital signs and the nursing notes.  Pertinent labs & imaging results that were available during my care of  the patient were reviewed by me and considered in my medical decision making (see chart for details).     Code stroke enacted based on VAN criteria.  Evaluated by neurology.  Recommend inpatient work-up for stroke.  Discussed with hospitalist who will admit.  Final Clinical Impressions(s) / ED Diagnoses   Final diagnoses:  Acute ischemic stroke [facility])    ED Discharge Orders    None       Julianne Rice, MD 04/17/18 1400

## 2018-04-17 NOTE — Code Documentation (Signed)
85 yo female coming from [place]  with complaints of trouble understanding and some numbness noted to the left side that started this morning when she woke up. Pt went to bed at 2300 last night and was normal. EMS was called and brought the patient to the ED. ED activated the Code Stroke. Stroke Team met patient in CT. Initial NIHSS 7 due to inability to answer questions, left visual field cut, left sensory decreased, left neglect, and receptive aphasia. CT Head negative for hemorrhage. Patient is outside the tPA window. CTA negative for LVO at this time. Pt is not an IR candidate. Handoff given to [employer], Therapist, sports. Pt to have an MRI and be admitted to the hospital.

## 2018-04-17 NOTE — ED Triage Notes (Signed)
Pt here from [place] with ems for left sided weakness. LSN 11pm last night, symptoms noted this morning around 1030am by daughter. Pt having left sided weakness, able to hold up left arm but grip strength is less in the left and decreased sensation. Pt also having right sided facial droop. Pt a.o.

## 2018-04-17 NOTE — Progress Notes (Signed)
Pt arrived to unit, aox4. MD notified

## 2018-04-17 NOTE — ED Notes (Signed)
Pt resting at this time.

## 2018-04-17 NOTE — H&P (Signed)
History and Physical    Valerie Barker XTG:[id number] DOB: 1922-09-05 DOA: 04/17/2018  PCP: Deland Pretty, MD  Patient coming from: Home  I have personally briefly reviewed patient's old medical records in [place]  Chief Complaint: Visual disturbances, right facial droop and left-sided upper and lower extremity weakness  HPI: Valerie Barker is a 85 y.o. female with medical history significant of hypertension, TIA, stroke, coronary artery disease, irritable bowel syndrome who presents emergency department not being able to see her clock properly upon waking up this morning along with trouble understanding and left-sided numbness.  Code stroke was called and she was seen by Dr. Marcelle Overlie in the emergency department and head CT was obtained was negative for acute intracranial pathology.  She is not a candidate for IV TPA because she presented outside the window.  CT angiogram was obtained and was negative for large vessel occlusion.  Patient is currently awake and complaining of abdominal discomfort.  She feels like she cannot urinate.  [place] is in place ED Course: Not a candidate for TPA, seen urgently by neurology.  CTA of the head unrevealing with no large vessel occlusion, CT scan of the head shows no acute stroke, much calcifications in the head and therefore neurology recommended adding aspirin to Plavix.  Review of Systems: As per HPI otherwise all other systems reviewed and  negative.    Past Medical History:  Diagnosis Date  . Heart disease   . Hypertension   . IBS (irritable bowel syndrome)   . Stroke ([place])    around 2011  . TIA (transient ischemic attack)     Past Surgical History:  Procedure Laterality Date  . ABDOMINAL HYSTERECTOMY  1978  . BUNIONECTOMY  2006, 1996  . [place]    Social History   Social History Narrative   Lives in [place] at [facility].  Education BA degree.  Retired.  3 children.  Son,  [place].  Caffeine 1=2 cups daily.     reports that she has never smoked. She has never used smokeless tobacco. She reports that she does not drink alcohol or use drugs.  Allergies  Allergen Reactions  . Duloxetine Other (See Comments)    unknown  . Erythromycin   . Mirtazapine     Other reaction(s): Other (See Comments) unknown  . Trazodone     Other reaction(s): Other (See Comments) unknown  . Zolpidem     Other reaction(s): Other (See Comments) unknown  . Cyclobenzaprine Anxiety    Unknown  . Diazepam Rash    unknown    Family History  Problem Relation Age of Onset  . COPD Mother   . Schizophrenia Brother   . Stroke Father   . Breast cancer Neg Hx      Prior to Admission medications   Medication Sig Start Date End Date Taking? Authorizing Provider  atorvastatin (LIPITOR) 10 MG tablet TAKE 1/2 TABLET EVERY DAY. 06/22/17   Troy Sine, MD  Biotin 5000 MCG CAPS Take 1 capsule by mouth daily.    [provider]  Cholecalciferol (VITAMIN D) 2000 UNITS CAPS Take 1 capsule by mouth daily.    [provider]  clopidogrel (PLAVIX) 75 MG tablet Take 75 mg by mouth daily.    [provider]  Cyanocobalamin (VITAMIN B-12 PO) Take 2,500 mcg by mouth daily.    [provider]  gabapentin (NEURONTIN) 100 MG capsule Take 1 capsule by mouth daily. 12/30/13   [provider]  hydrochlorothiazide (HYDRODIURIL) 25 MG tablet  10/25/17   [provider]  Multiple Vitamin (MULTIVITAMIN) capsule Take 1 capsule by mouth daily.    [provider]  rOPINIRole (REQUIP) 0.5 MG tablet Take by mouth at bedtime. 2 tabs at bedtime, and takes 1/2 tab in afternoon prn 07/04/14   [provider]  valsartan (DIOVAN) 320 MG tablet  11/15/17   [provider]    Physical Exam:  Constitutional: NAD, calm, comfortable Vitals:   04/17/18 1201 04/17/18 1310 04/17/18 1340 04/17/18 1400  BP: (!) 163/67  (!) 141/97 (!) 194/83   Pulse:  78 66 71  Resp:  (!) 21 15 17   Temp:      TempSrc:      SpO2:   98% 98%   Eyes: PERRL, lids and conjunctivae normal ENMT: Mucous membranes are moist. Posterior pharynx clear of any exudate or lesions.Normal dentition.  Neck: normal, supple, no masses, no thyromegaly Respiratory: clear to auscultation bilaterally, no wheezing, no crackles. Normal respiratory effort. No accessory muscle use.  Cardiovascular: Regular rate and rhythm, no murmurs / rubs / gallops. No extremity edema. 2+ pedal pulses. No carotid bruits.  Abdomen: no tenderness, no masses palpated. No hepatosplenomegaly. Bowel sounds positive.  Bladder obviously palpable on physical examination with tenderness Musculoskeletal: no clubbing / cyanosis. No joint deformity upper and lower extremities. Good ROM, no contractures. Normal muscle tone.  Delayed response to commands with difficulty on the left side.   Skin: no rashes, lesions, ulcers. No induration Neurologic: CN 2-12 grossly intact. Sensation intact, DTR normal. She is 4-/5 in motor strength on the left and 5/5 on the right.  Otherwise motor strength is 5/5. Psychiatric: Normal judgment and insight. Alert and oriented x 3. Normal mood.    Labs on Admission: I have personally reviewed following labs and imaging studies  CBC: Recent Labs  Lab 04/17/18 1246 04/17/18 1251  WBC 6.6  --   NEUTROABS 4.4  --   HGB 12.5 12.9  HCT 39.4 38.0  MCV 94.7  --   PLT 163  --    Basic Metabolic Panel: Recent Labs  Lab 04/17/18 1246 04/17/18 1251  NA 139 138  K 4.2 4.1  CL 105 104  CO2 25  --   GLUCOSE 105* 101*  BUN 14 16  CREATININE 0.89 0.80  CALCIUM 9.3  --    GFR: Estimated Creatinine Clearance: 34.1 mL/min (by C-G formula based on SCr of 0.8 mg/dL). Liver Function Tests: Recent Labs  Lab 04/17/18 1246  AST 23  ALT 21  ALKPHOS 51  BILITOT 0.8  PROT 6.1*  ALBUMIN 3.9   Coagulation Profile: Recent Labs  Lab 04/17/18 1246  INR 1.05   Urine  analysis:    Component Value Date/Time   COLORURINE YELLOW 04/17/2018 1340   APPEARANCEUR HAZY (A) 04/17/2018 1340   LABSPEC 1.009 04/17/2018 1340   PHURINE 7.0 04/17/2018 1340   GLUCOSEU NEGATIVE 04/17/2018 1340   HGBUR SMALL (A) 04/17/2018 1340   BILIRUBINUR NEGATIVE 04/17/2018 1340   [place] 04/17/2018 1340   PROTEINUR NEGATIVE 04/17/2018 1340   UROBILINOGEN 0.2 04/27/2009 1643   NITRITE NEGATIVE 04/17/2018 1340   LEUKOCYTESUR TRACE (A) 04/17/2018 1340    Radiological Exams on Admission: Ct Angio Head W Or Wo Contrast  Result Date: 04/17/2018 CLINICAL DATA:  Last seen normal 2300 hours. Left-sided weakness. Right-sided facial droop. EXAM: CT ANGIOGRAPHY HEAD AND NECK TECHNIQUE: Multidetector CT imaging of the head and neck was performed  using the standard protocol during bolus administration of intravenous contrast. Multiplanar CT image reconstructions and MIPs were obtained to evaluate the vascular anatomy. Carotid stenosis measurements (when applicable) are obtained utilizing NASCET criteria, using the distal internal carotid diameter as the denominator. CONTRAST:  96mL ISOVUE-370 IOPAMIDOL (ISOVUE-370) INJECTION 76% COMPARISON:  CT earlier same day. FINDINGS: CTA NECK FINDINGS Aortic arch: Aortic atherosclerosis. No aneurysm or dissection. Branching pattern is normal without origin stenosis. Brachiocephalic vessels are tortuous. Right carotid system: Common carotid artery widely patent to the bifurcation. Minimal atherosclerotic plaque at the carotid bifurcation but no stenosis. Cervical ICA is widely patent, though tortuous. Left carotid system: Common carotid artery widely patent to the bifurcation. Carotid bifurcation shows minimal atherosclerotic plaque. No stenosis or irregularity. Cervical ICA is tortuous but widely patent. Vertebral arteries: Both vertebral artery origins are widely patent. The vertebral arteries are widely patent through the cervical region to the foramen  magnum. Skeleton: Normal Other neck: No mass or lymphadenopathy. Upper chest: Mild pleural and parenchymal scarring at the apices. Review of the MIP images confirms the above findings CTA HEAD FINDINGS Anterior circulation: Both internal carotid arteries are patent through the skull base and siphon regions. There is siphon calcification but no stenosis. The anterior and middle cerebral vessels are patent without proximal stenosis, aneurysm or vascular malformation. No large or medium vessel occlusion. Posterior circulation: Both vertebral arteries are widely patent to the basilar. No basilar stenosis. Posterior circulation branch vessels are patent. Patent posterior communicating arteries on each side. Venous sinuses: Patent and normal. Anatomic variants: None significant. Delayed phase: No abnormal enhancement. Review of the MIP images confirms the above findings IMPRESSION: No large or medium vessel occlusion or correctable proximal stenosis. These results were called by telephone at the time of interpretation on 04/17/2018 at 1:18 pm to Dr. Ray Church , who verbally acknowledged these results. Electronically Signed   By: Nelson Chimes M.D.   On: 04/17/2018 13:19   Ct Angio Neck W Or Wo Contrast  Result Date: 04/17/2018 CLINICAL DATA:  Last seen normal 2300 hours. Left-sided weakness. Right-sided facial droop. EXAM: CT ANGIOGRAPHY HEAD AND NECK TECHNIQUE: Multidetector CT imaging of the head and neck was performed using the standard protocol during bolus administration of intravenous contrast. Multiplanar CT image reconstructions and MIPs were obtained to evaluate the vascular anatomy. Carotid stenosis measurements (when applicable) are obtained utilizing NASCET criteria, using the distal internal carotid diameter as the denominator. CONTRAST:  66mL ISOVUE-370 IOPAMIDOL (ISOVUE-370) INJECTION 76% COMPARISON:  CT earlier same day. FINDINGS: CTA NECK FINDINGS Aortic arch: Aortic atherosclerosis. No aneurysm or  dissection. Branching pattern is normal without origin stenosis. Brachiocephalic vessels are tortuous. Right carotid system: Common carotid artery widely patent to the bifurcation. Minimal atherosclerotic plaque at the carotid bifurcation but no stenosis. Cervical ICA is widely patent, though tortuous. Left carotid system: Common carotid artery widely patent to the bifurcation. Carotid bifurcation shows minimal atherosclerotic plaque. No stenosis or irregularity. Cervical ICA is tortuous but widely patent. Vertebral arteries: Both vertebral artery origins are widely patent. The vertebral arteries are widely patent through the cervical region to the foramen magnum. Skeleton: Normal Other neck: No mass or lymphadenopathy. Upper chest: Mild pleural and parenchymal scarring at the apices. Review of the MIP images confirms the above findings CTA HEAD FINDINGS Anterior circulation: Both internal carotid arteries are patent through the skull base and siphon regions. There is siphon calcification but no stenosis. The anterior and middle cerebral vessels are patent without proximal stenosis, aneurysm or  vascular malformation. No large or medium vessel occlusion. Posterior circulation: Both vertebral arteries are widely patent to the basilar. No basilar stenosis. Posterior circulation branch vessels are patent. Patent posterior communicating arteries on each side. Venous sinuses: Patent and normal. Anatomic variants: None significant. Delayed phase: No abnormal enhancement. Review of the MIP images confirms the above findings IMPRESSION: No large or medium vessel occlusion or correctable proximal stenosis. These results were called by telephone at the time of interpretation on 04/17/2018 at 1:18 pm to Dr. Ray Church , who verbally acknowledged these results. Electronically Signed   By: Nelson Chimes M.D.   On: 04/17/2018 13:19   Ct Head Code Stroke Wo Contrast  Result Date: 04/17/2018 CLINICAL DATA:  Code stroke. Last seen  normal 2300 hours. Left-sided weakness. Right-sided facial droop. EXAM: CT HEAD WITHOUT CONTRAST TECHNIQUE: Contiguous axial images were obtained from the base of the skull through the vertex without intravenous contrast. COMPARISON:  04/11/2018 FINDINGS: Brain: Advanced chronic small-vessel ischemic changes throughout the brain. No sign of acute infarction, mass lesion, hemorrhage, hydrocephalus or extra-axial collection. Vascular: There is atherosclerotic calcification of the major vessels at the base of the brain. Skull: Negative Sinuses/Orbits: Clear/normal Other: None ASPECTS ([place] Stroke Program Early CT Score) - Ganglionic level infarction (caudate, lentiform nuclei, internal capsule, insula, M1-M3 cortex): 7 - Supraganglionic infarction (M4-M6 cortex): 3 Total score (0-10 with 10 being normal): 10 IMPRESSION: 1. No acute finding by CT. Advanced chronic small-vessel ischemic changes throughout the brain. 2. ASPECTS is 10. * These results were called by telephone at the time of interpretation on 04/17/2018 at 1:02 pm to Dr. Marcelle Overlie , who verbally acknowledged these results. Electronically Signed   By: Nelson Chimes M.D.   On: 04/17/2018 13:03    EKG: Independently reviewed.  Sinus rhythm with borderline T wave abnormalities at 68 bpm.  No change when compared to 12/27/2017  Assessment/Plan Principal Problem:   Stroke (cerebrum) (HCC) Active Problems:   Dyslipidemia   Short-term memory loss   Essential hypertension   Acute urinary retention    1.  Probable acute stroke: Patient with history of stroke multiple calcifications on CTA of the head.  Seen by neurology who recommended MRI.  She is already had a CTA therefore does not need vascular Dopplers nor does she require MRA of the brain.  We will obtain a simple MRI.  PT OT ST for evaluations n.p.o. until speech clears the patient.  2.  Acute urinary retention: Patient unable to urinate.  We bladder scanned her and she had significant urine  output.  Currently approximately 1300 mL's documented not sure if all that is coming from the in and out cath procedure.  Will bladder scan every 6 hours if she has urinary retention of greater than 200 mL for 3 attempts will insert Foley catheter.  3.  Essential hypertension: We will hold blood pressure medicines for now and allow for permissive hypertension.  Blood pressure at 141/97 currently.  We will continue to monitor.  4.  Dyslipidemia we will continue home statin.  5.  Short-term memory loss continue home management regimen.  DVT prophylaxis: Q. heparin Code Status: Full code, would need further discussion.  When discussed with patient alone she stated she wanted to be given a chance. Family Communication: Spoke with patient's children who are present at the bedside. Disposition Plan: To be determined based on PT and OT evaluations. Consults called: Dr. Marcelle Overlie from neurology Admission status: Observation but I expect will require conversion to  inpatient.   Lady Deutscher MD FACP [facility] Pager [phone]  If 7PM-7AM, please contact night-coverage [email or web address] Password [email or web address]  04/17/2018, 3:21 PM

## 2018-04-17 NOTE — ED Notes (Signed)
PT at bedside.

## 2018-04-17 NOTE — Consult Note (Signed)
Neurology Consultation  Reason for Consult: Code Stroke Referring Physician: Julianne Rice, MD  CC: Visual disturbances  History is obtained from: Patient and family  HPI: Valerie Barker is a 85 y.o. female with PMH of HTN, TIA, CVA who complained of not being able to see her clock properly upon waking this morning along with some trouble understanding and Left sided numbness.  Code Stroke was called in ED.  LKW 11PM. Head CT was negative for acute intracranial pathology.  NIHSS 7.  Pt was not a candidate for IV tPA since she presented outside the window.  A CT angio was also done which was negative for LVO.  Pt denies fever, chills, CP, SOB.   LKW: 11PM tpa given?: no, outside window Premorbid modified Rankin scale (mRS): 3 0-Completely asymptomatic and back to baseline post-stroke 1-No significant post stroke disability and can perform usual duties with stroke symptoms 2-Slight disability-UNABLE to perform all activities but does not need assistance  3-Moderate disability-requires help but walks WITHOUT assistance 4-Needs assistance to walk and tend to bodily needs 5-Severe disability-bedridden, incontinent, needs constant attention 6- Death  ROS: A 14 point ROS was performed and is negative except as noted in the HPI.   Past Medical History:  Diagnosis Date  . Heart disease   . Hypertension   . IBS (irritable bowel syndrome)   . Stroke ([place])    around 2011  . TIA (transient ischemic attack)     3-Moderate disability-requires help but walks WITHOUT assistance Essential (primary) hypertension   Family History  Problem Relation Age of Onset  . COPD Mother   . Schizophrenia Brother   . Stroke Father   . Breast cancer Neg Hx     Social History:   reports that she has never smoked. She has never used smokeless tobacco. She reports that she does not drink alcohol or use drugs.  Medications No current facility-administered medications for this encounter.    Current Outpatient Medications:  .  atorvastatin (LIPITOR) 10 MG tablet, TAKE 1/2 TABLET EVERY DAY., Disp: 45 tablet, Rfl: 4 .  Biotin 5000 MCG CAPS, Take 1 capsule by mouth daily., Disp: , Rfl:  .  Cholecalciferol (VITAMIN D) 2000 UNITS CAPS, Take 1 capsule by mouth daily., Disp: , Rfl:  .  clopidogrel (PLAVIX) 75 MG tablet, Take 75 mg by mouth daily., Disp: , Rfl:  .  Cyanocobalamin (VITAMIN B-12 PO), Take 2,500 mcg by mouth daily., Disp: , Rfl:  .  gabapentin (NEURONTIN) 100 MG capsule, Take 1 capsule by mouth daily., Disp: , Rfl:  .  hydrochlorothiazide (HYDRODIURIL) 25 MG tablet, , Disp: , Rfl:  .  Multiple Vitamin (MULTIVITAMIN) capsule, Take 1 capsule by mouth daily., Disp: , Rfl:  .  rOPINIRole (REQUIP) 0.5 MG tablet, Take by mouth at bedtime. 2 tabs at bedtime, and takes 1/2 tab in afternoon prn, Disp: , Rfl:  .  valsartan (DIOVAN) 320 MG tablet, , Disp: , Rfl:   Exam: Current vital signs: BP (!) 141/97   Pulse 66   Temp 98.3 F (36.8 C) (Oral)   Resp 15   SpO2 98%  Vital signs in last 24 hours: Temp:  [98.3 F (36.8 C)] 98.3 F (36.8 C) (01/14 1159) Pulse Rate:  [66-78] 66 (01/14 1340) Resp:  [13-21] 15 (01/14 1340) BP: (141-163)/(67-97) 141/97 (01/14 1340) SpO2:  [98 %] 98 % (01/14 1340)  Physical Exam  Constitutional: Appears well-developed and well-nourished.  Psych: Affect appropriate to situation Eyes: No scleral injection HENT:  No OP obstrucion Head: Normocephalic.  Cardiovascular: Normal rate and regular rhythm.  Respiratory: Effort normal, non-labored breathing GI: Soft.  No distension. There is no tenderness.  Skin: WDI  Neuro: Mental Status: Patient is awake, alert, oriented to person, place, year, and situation. Patient is able to give a clear and coherent history. L hemineglect Cranial Nerves: II: Visual Fields are full. Pupils are equal, round, and reactive to light.   III,IV, VI: EOMI without ptosis or diploplia.  V: Facial sensation is  symmetric to temperature VII: mild L facial droop VIII: hearing is intact to voice X: Uvula elevates symmetrically XI: Shoulder shrug is symmetric. XII: tongue is midline without atrophy or fasciculations.  Motor: Tone is normal. Bulk is normal. 5/5 strength was present in all four extremities. Weak L grip Sensory: Sensation is symmetric to light touch and temperature in the arms and legs. L hemineglect Deep Tendon Reflexes: 2+ and symmetric in the biceps and patellae.  Plantars: Toes are downgoing bilaterally.  Cerebellar: FNF and HKS are intact bilaterally  NIHSS 7   Labs Labs were reviewed  CBC    Component Value Date/Time   WBC 6.6 04/17/2018 1246   RBC 4.16 04/17/2018 1246   HGB 12.9 04/17/2018 1251   HCT 38.0 04/17/2018 1251   PLT 163 04/17/2018 1246   MCV 94.7 04/17/2018 1246   MCH 30.0 04/17/2018 1246   MCHC 31.7 04/17/2018 1246   RDW 14.2 04/17/2018 1246   LYMPHSABS 1.4 04/17/2018 1246   MONOABS 0.6 04/17/2018 1246   EOSABS 0.1 04/17/2018 1246   BASOSABS 0.0 04/17/2018 1246    CMP     Component Value Date/Time   NA 138 04/17/2018 1251   K 4.1 04/17/2018 1251   CL 104 04/17/2018 1251   CO2 25 04/17/2018 1246   GLUCOSE 101 (H) 04/17/2018 1251   BUN 16 04/17/2018 1251   CREATININE 0.80 04/17/2018 1251   CALCIUM 9.3 04/17/2018 1246   PROT 6.1 (L) 04/17/2018 1246   ALBUMIN 3.9 04/17/2018 1246   AST 23 04/17/2018 1246   ALT 21 04/17/2018 1246   ALKPHOS 51 04/17/2018 1246   BILITOT 0.8 04/17/2018 1246   GFRNONAA 56 (L) 04/17/2018 1246   GFRAA >60 04/17/2018 1246    Lipid Panel     Component Value Date/Time   CHOL 118 01/29/2013 0520   TRIG 66 01/29/2013 0520   HDL 66 01/29/2013 0520   CHOLHDL 1.8 01/29/2013 0520   VLDL 13 01/29/2013 0520   LDLCALC 39 01/29/2013 0520     Imaging I have reviewed the images obtained:  CT-scan of the brain negative for acute intracranial pathology   A/P: 85 yo F with PMH of HTN, TIA, CVA who complained of  not being able to see her clock properly upon waking this morning along with some trouble understanding and Left sided numbness likely due to R parietal infarct though CT head and CT angio are negative  Obtain MRI brain Add ASA to plavix - pt has extensive atherosclerosis on CT Cont lipitor 10mg  (previous LDL 39) Check A1C and lipid profile Check 2D echo Permissive HTN - consider NS at 50cc/hr Swallow Eval PT/OT Eval  Total time spent 86min  Ray Church, MD Attending Neurologist [phone]

## 2018-04-17 NOTE — Evaluation (Signed)
Physical Therapy Evaluation Patient Details Name: Valerie Barker MRN: [id number] DOB: [birth year] Today's Date: 04/17/2018   History of Present Illness  Pt is a 85 y/o female presenting with visual deficits and L sided numbness. Workup pending. CT negative for acute abnormailty. Awaiting MRI. PMH includes HTN and CVA.   Clinical Impression  Pt admitted secondary to problem above with deficits below. Pt sleepy throughout session. Pt presenting with L sided visual deficits, L sided sensory deficits, unsteadiness, and weakness. Pt requiring min to mod A +2 for mobility, and pt only tolerating side stepping at EOB. Feel pt will require post acute rehab at d/c, however, will need to reassess once lethargy improved. If pt's tolerance improves, will likely be good candidate for CIR. Will update recommendations pending pt progress and will continue to follow acutely to maximize functional mobility independence and safety.     Follow Up Recommendations Other (comment);Supervision/Assistance - 24 hour(CIR vs SNF pending progress)    Equipment Recommendations  Other (comment)(TBD)    Recommendations for Other Services       Precautions / Restrictions Precautions Precautions: Fall Precaution Comments: Had a fall a week ago  Restrictions Weight Bearing Restrictions: No      Mobility  Bed Mobility Overal bed mobility: Needs Assistance Bed Mobility: Sit to Supine;Supine to Sit     Supine to sit: Min assist Sit to supine: Min assist;+2 for physical assistance   General bed mobility comments: Min A for LE assist and trunk elevation. Required cues for sequencing and increased time to come to EOB. Required assist for LE assist and trunk descent upon return to supine. Pt leaving LUE under her body upon return to supine and required assist for LUE. Question inattention vs neglect.   Transfers Overall transfer level: Needs assistance Equipment used: 2 person hand held assist Transfers: Sit  to/from Stand Sit to Stand: Min assist;Mod assist;+2 physical assistance         General transfer comment: Min-mod A +2 for lift assist and steadying.   Ambulation/Gait Ambulation/Gait assistance: Min assist;Mod assist;+2 physical assistance   Assistive device: 2 person hand held assist   Gait velocity: Decreased    General Gait Details: Attempted side stepping at EOB, however, pt very sleepy and only able to tolerate a few steps. Min to mod A +2 for steadying assist.   Stairs            Wheelchair Mobility    Modified Rankin (Stroke Patients Only) Modified Rankin (Stroke Patients Only) Pre-Morbid Rankin Score: No significant disability Modified Rankin: Moderately severe disability     Balance Overall balance assessment: Needs assistance Sitting-balance support: Bilateral upper extremity supported;Feet unsupported Sitting balance-Leahy Scale: Poor Sitting balance - Comments: Min A for sitting balance.                                      Pertinent Vitals/Pain Pain Assessment: No/denies pain    Home Living Family/patient expects to be discharged to:: Private residence Living Arrangements: Alone Available Help at Discharge: Family;Available PRN/intermittently Type of Home: Independent living facility Home Access: Elevator     Home Layout: One level Home Equipment: [place] - 2 wheels;Cane - single point      Prior Function Level of Independence: Independent with assistive device(s)         Comments: Pt used RW and cane. Per daughter pt was driving up until the last week.  Hand Dominance        Extremity/Trunk Assessment   Upper Extremity Assessment Upper Extremity Assessment: Defer to OT evaluation;LUE deficits/detail LUE Deficits / Details: Decreased sensation on LUE. Question proprioception deficits as well.  LUE Coordination: decreased gross motor;decreased fine motor    Lower Extremity Assessment Lower Extremity  Assessment: LLE deficits/detail LLE Deficits / Details: Able to perform SLR. Unable to perform formal assessment secondary to increased sleepiness. Pt able to feel when testing sensation on inside of LLE, however, when testing outside of LLE, pt reports I was touching the RLE.     Cervical / Trunk Assessment Cervical / Trunk Assessment: Kyphotic  Communication   Communication: No difficulties  Cognition Arousal/Alertness: Lethargic Behavior During Therapy: WFL for tasks assessed/performed Overall Cognitive Status: Within Functional Limits for tasks assessed                                 General Comments: Pt was very sleepy throughout session. Was slow to respond, but was A&O X4. Was accurate in giving home information as well.       General Comments General comments (skin integrity, edema, etc.): Question neglect vs. inattention on the L side. Noted L peripheral vision deficits as well.     Exercises     Assessment/Plan    PT Assessment Patient needs continued PT services  PT Problem List Decreased knowledge of precautions;Decreased knowledge of use of DME;Impaired sensation;Decreased mobility;Decreased balance;Decreased activity tolerance;Decreased strength       PT Treatment Interventions DME instruction;Gait training;Functional mobility training;Therapeutic activities;Therapeutic exercise;Balance training;Neuromuscular re-education;Patient/family education    PT Goals (Current goals can be found in the Care Plan section)  Acute Rehab PT Goals Patient Stated Goal: to regain independence PT Goal Formulation: With patient/family Time For Goal Achievement: 05/01/18 Potential to Achieve Goals: Good    Frequency Min 4X/week   Barriers to discharge        Co-evaluation               AM-PAC PT "6 Clicks" Mobility  Outcome Measure Help needed turning from your back to your side while in a flat bed without using bedrails?: A Little Help needed moving  from lying on your back to sitting on the side of a flat bed without using bedrails?: A Little Help needed moving to and from a bed to a chair (including a wheelchair)?: A Lot Help needed standing up from a chair using your arms (e.g., wheelchair or bedside chair)?: A Lot Help needed to walk in hospital room?: A Lot Help needed climbing 3-5 steps with a railing? : Total 6 Click Score: 13    End of Session Equipment Utilized During Treatment: Gait belt Activity Tolerance: Patient limited by lethargy Patient left: in bed;with call bell/phone within reach;with family/visitor present Nurse Communication: Mobility status PT Visit Diagnosis: Unsteadiness on feet (R26.81);Muscle weakness (generalized) (M62.81);Other symptoms and signs involving the nervous system (B63.845)    Time: 3646-8032 PT Time Calculation (min) (ACUTE ONLY): 17 min   Charges:   PT Evaluation $PT Eval Moderate Complexity: Nelson, PT, DPT  Acute Rehabilitation Services  Pager: [phone] Office: [phone]   Rudean Hitt 04/17/2018, 4:13 PM

## 2018-04-18 ENCOUNTER — Observation Stay (HOSPITAL_COMMUNITY): Payer: Medicare Other

## 2018-04-18 ENCOUNTER — Encounter: Payer: Self-pay | Admitting: Physician Assistant

## 2018-04-18 ENCOUNTER — Other Ambulatory Visit: Payer: Self-pay | Admitting: Physician Assistant

## 2018-04-18 ENCOUNTER — Ambulatory Visit (HOSPITAL_BASED_OUTPATIENT_CLINIC_OR_DEPARTMENT_OTHER): Payer: Medicare Other

## 2018-04-18 DIAGNOSIS — I10 Essential (primary) hypertension: Secondary | ICD-10-CM

## 2018-04-18 DIAGNOSIS — Z7902 Long term (current) use of antithrombotics/antiplatelets: Secondary | ICD-10-CM | POA: Diagnosis not present

## 2018-04-18 DIAGNOSIS — I6389 Other cerebral infarction: Secondary | ICD-10-CM

## 2018-04-18 DIAGNOSIS — E785 Hyperlipidemia, unspecified: Secondary | ICD-10-CM

## 2018-04-18 DIAGNOSIS — R338 Other retention of urine: Secondary | ICD-10-CM

## 2018-04-18 DIAGNOSIS — G2581 Restless legs syndrome: Secondary | ICD-10-CM | POA: Diagnosis not present

## 2018-04-18 DIAGNOSIS — R413 Other amnesia: Secondary | ICD-10-CM

## 2018-04-18 DIAGNOSIS — I69359 Hemiplegia and hemiparesis following cerebral infarction affecting unspecified side: Secondary | ICD-10-CM

## 2018-04-18 DIAGNOSIS — G459 Transient cerebral ischemic attack, unspecified: Secondary | ICD-10-CM | POA: Diagnosis not present

## 2018-04-18 DIAGNOSIS — R0989 Other specified symptoms and signs involving the circulatory and respiratory systems: Secondary | ICD-10-CM

## 2018-04-18 DIAGNOSIS — H539 Unspecified visual disturbance: Secondary | ICD-10-CM | POA: Diagnosis not present

## 2018-04-18 DIAGNOSIS — Z79899 Other long term (current) drug therapy: Secondary | ICD-10-CM | POA: Diagnosis not present

## 2018-04-18 DIAGNOSIS — I639 Cerebral infarction, unspecified: Secondary | ICD-10-CM

## 2018-04-18 LAB — COMPREHENSIVE METABOLIC PANEL
ALBUMIN: 3.6 g/dL (ref 3.5–5.0)
ALT: 21 U/L (ref 0–44)
AST: 25 U/L (ref 15–41)
Alkaline Phosphatase: 47 U/L (ref 38–126)
Anion gap: 9 (ref 5–15)
BUN: 15 mg/dL (ref 8–23)
CO2: 23 mmol/L (ref 22–32)
Calcium: 8.7 mg/dL — ABNORMAL LOW (ref 8.9–10.3)
Chloride: 102 mmol/L (ref 98–111)
Creatinine, Ser: 0.92 mg/dL (ref 0.44–1.00)
GFR calc Af Amer: >60 mL/min (ref >60)
GFR calc non Af Amer: 54 mL/min — ABNORMAL LOW (ref >60)
Glucose, Bld: 152 mg/dL — ABNORMAL HIGH (ref 70–99)
Potassium: 3.9 mmol/L (ref 3.5–5.1)
Sodium: 134 mmol/L — ABNORMAL LOW (ref 135–145)
Total Bilirubin: 0.8 mg/dL (ref 0.3–1.2)
Total Protein: 5.9 g/dL — ABNORMAL LOW (ref 6.5–8.1)

## 2018-04-18 LAB — CBC WITH DIFFERENTIAL/PLATELET
Abs Immature Granulocytes: 0.02 10*3/uL (ref 0.00–0.07)
Basophils Absolute: 0 10*3/uL (ref 0.0–0.1)
Basophils Relative: 1 %
Eosinophils Absolute: 0 10*3/uL (ref 0.0–0.5)
Eosinophils Relative: 0 %
HCT: 36.7 % (ref 36.0–46.0)
Hemoglobin: 12.1 g/dL (ref 12.0–15.0)
Immature Granulocytes: 0 %
Lymphocytes Relative: 22 %
Lymphs Abs: 1.8 10*3/uL (ref 0.7–4.0)
MCH: 30.1 pg (ref 26.0–34.0)
MCHC: 33 g/dL (ref 30.0–36.0)
MCV: 91.3 fL (ref 80.0–100.0)
Monocytes Absolute: 0.7 10*3/uL (ref 0.1–1.0)
Monocytes Relative: 9 %
Neutro Abs: 5.7 10*3/uL (ref 1.7–7.7)
Neutrophils Relative %: 68 %
Platelets: 184 10*3/uL (ref 150–400)
RBC: 4.02 MIL/uL (ref 3.87–5.11)
RDW: 14.2 % (ref 11.5–15.5)
WBC: 8.2 10*3/uL (ref 4.0–10.5)
nRBC: 0 % (ref 0.0–0.2)

## 2018-04-18 LAB — LIPID PANEL
Cholesterol: 128 mg/dL (ref 0–200)
HDL: 67 mg/dL (ref >40)
LDL Cholesterol: 48 mg/dL (ref 0–99)
Total CHOL/HDL Ratio: 1.9 RATIO
Triglycerides: 67 mg/dL (ref <150)
VLDL: 13 mg/dL (ref 0–40)

## 2018-04-18 LAB — HEMOGLOBIN A1C
Hgb A1c MFr Bld: 5.6 % (ref 4.8–5.6)
Mean Plasma Glucose: 114.02 mg/dL

## 2018-04-18 LAB — MAGNESIUM: Magnesium: 1.8 mg/dL (ref 1.7–2.4)

## 2018-04-18 LAB — PHOSPHORUS: Phosphorus: 3.3 mg/dL (ref 2.5–4.6)

## 2018-04-18 LAB — ECHOCARDIOGRAM COMPLETE

## 2018-04-18 MED ORDER — ASPIRIN EC 81 MG PO TBEC
81.0000 mg | DELAYED_RELEASE_TABLET | Freq: Every day | ORAL | Status: DC
  Administered 2018-04-18 – 2018-04-19 (×2): 81 mg via ORAL
  Filled 2018-04-18 (×2): qty 1

## 2018-04-18 MED ORDER — LORAZEPAM 2 MG/ML IJ SOLN: 0.5000 mg | Freq: Once | INTRAMUSCULAR | Status: DC | PRN

## 2018-04-18 NOTE — Progress Notes (Signed)
[facility]  PROGRESS NOTE    Valerie Barker  ELF:[id number] DOB: 1923/02/22 DOA: 04/17/2018 PCP: Deland Pretty, MD   Brief Narrative:  HPI per Dr. Randa Spike on 04/17/2018 Valerie Barker is a 85 y.o. female with medical history significant of hypertension, TIA, stroke, coronary artery disease, irritable bowel syndrome who presents emergency department not being able to see her clock properly upon waking up this morning along with trouble understanding and left-sided numbness.  Code stroke was called and she was seen by Dr. Marcelle Overlie in the emergency department and head CT was obtained was negative for acute intracranial pathology.  She is not a candidate for IV TPA because she presented outside the window.  CT angiogram was obtained and was negative for large vessel occlusion.  Patient is currently awake and complaining of abdominal discomfort.  She feels like she cannot urinate.  [place] is in place ED Course: Not a candidate for TPA, seen urgently by neurology.  CTA of the head unrevealing with no large vessel occlusion, CT scan of the head shows no acute stroke, much calcifications in the head and therefore neurology recommended adding aspirin to Plavix.  **Seen and examined at bedside and was doing ok. Unable to have MRI complete yesterday so will re-attempt today with sedation. Neurology Stroke Team following.   Assessment & Plan:   Principal Problem:   Stroke (cerebrum) ([place]) Active Problems:   Dyslipidemia   Short-term memory loss   Essential hypertension   Acute urinary retention  Probable Acute Stroke vs TIA -Patient with history of stroke multiple calcifications on CTA of the head.   -Seen by Neurology who recommended MRI.   -She has already had a CTA therefore does not need vascular Dopplers nor does she require MRA of the brain. -We will obtain a simple MRI.   -PT/OT/SLP for evaluations  -N.p.o. until speech clears the patient. -C/w Neurochecks per  Protocol -ECHOCardiogram done and pending read  -Lipid Panel done and showed a 128, HDL 67, LDL 48, triglycerides of 67, and VLDL of 13 -HbA1c was 5.6 -Neurology evaluated and recommending Clopidogrel 75 mg Daily prior to Admission and recommending DAPT with ASA and Plavix x3 weeks and then stopping ASA and c/w Plavix alone -PT/OT recommending CIR vs. SNF -Further recommendations per Neurology   Acute Urinary Retention -Patient unable to urinate.   -We bladder scanned her and she had significant urine output.   -Currently approximately 1300 mL's documented  -Not sure if all that is coming from the in and out cath procedure.   -Will bladder scan every 6 hours if she has urinary retention of greater than 200 mL for 3 attempts will insert Foley catheter. -U/A Unremarkable  -Check Renal U/S  Essential Hypertension -We will hold blood pressure medicines of Valsartan for now and allow for permissive hypertension.   -Blood pressure at 132/54 currently.   -We will continue to monitor.  Dyslipidemia  -Atorvastatin 5 mg po Daily resumed by Neurology  Short-Term Memory loss  -continue home management regimen with Vitamin B12 2,500 mcg po Daily   Hyponatremia -Patient's Na+ is 134 -Mild. Continue to Monitor and repeat CMP in AM -If Necessary may start IVF Hydration  Hx of CVA/TIA -Neurology following   RLS -C/w Ropinirole 0.5 mg po Daily and 1 mg po qHS  DVT prophylaxis: Heparin 5,000 units sq q8h Code Status: FULL CODE  Family Communication: Discussed with Daughter at bedside  Disposition Plan: CIR vs. SNF pending   Consultants:   Neurology  Stroke Team   Procedures:  ECHOCARDIOGRAM Done and pending Read   Antimicrobials:  Anti-infectives (From admission, onward)   None     Subjective: And examined at bedside was doing okay.  Denied chest pain, lightheadedness or dizziness.  States that she feels okay.  Unable to get MRI last night due to agitation so we will try  again later today.   Objective: Vitals:   04/18/18 0015 04/18/18 0359 04/18/18 0733 04/18/18 1131  BP: (!) 107/43 (!) 133/47 (!) 138/59 122/61  Pulse: 79 67 69 70  Resp:  20 18 18   Temp:  26.2 F (37.4 C) 98.7 F (37.1 C) 98.4 F (36.9 C)  TempSrc:  Oral Oral Oral  SpO2:  99% 95% 93%    Intake/Output Summary (Last 24 hours) at 04/18/2018 1517 Last data filed at 04/18/2018 1435 Gross per 24 hour  Intake 579.51 ml  Output 550 ml  Net 29.51 ml   There were no vitals filed for this visit.  Examination: Physical Exam:  Constitutional: WN/WD Caucasian female in NAD and appears calm  Eyes: Lids and conjunctivae normal, sclerae anicteric  ENMT: External Ears, Nose appear normal. Grossly normal hearing. Mucous membranes are moist.  Neck: Appears normal, supple, no cervical masses, normal ROM, no appreciable thyromegaly; no JVD Respiratory: Diminished to auscultation bilaterally, no wheezing, rales, rhonchi or crackles. Normal respiratory effort and patient is not tachypenic. No accessory muscle use.  Cardiovascular: RRR, no murmurs / rubs / gallops. S1 and S2 auscultated. No extremity edema.  Abdomen: Soft, non-tender, non-distended. No masses palpated. No appreciable hepatosplenomegaly. Bowel sounds positive x4.  GU: Deferred. Musculoskeletal: No clubbing / cyanosis of digits/nails. No joint deformity upper and lower extremities.  Skin: No rashes, lesions, ulcers on a limited skin eval. No induration; Warm and dry.  Neurologic: CN 2-12 grossly intact with no focal deficits except that she has a mild L facial droop.  Romberg sign and cerebellar reflexes not assessed.  Psychiatric: Normal judgment and insight. Alert and oriented x 3. Normal mood and appropriate affect.   Data Reviewed: I have personally reviewed following labs and imaging studies  CBC: Recent Labs  Lab 04/17/18 1246 04/17/18 1251 04/18/18 0847  WBC 6.6  --  8.2  NEUTROABS 4.4  --  5.7  HGB 12.5 12.9 12.1  HCT  39.4 38.0 36.7  MCV 94.7  --  91.3  PLT 163  --  035   Basic Metabolic Panel: Recent Labs  Lab 04/17/18 1246 04/17/18 1251 04/18/18 0847  NA 139 138 134*  K 4.2 4.1 3.9  CL 105 104 102  CO2 25  --  23  GLUCOSE 105* 101* 152*  BUN 14 16 15   CREATININE 0.89 0.80 0.92  CALCIUM 9.3  --  8.7*  MG  --   --  1.8  PHOS  --   --  3.3   GFR: Estimated Creatinine Clearance: 29.6 mL/min (by C-G formula based on SCr of 0.92 mg/dL). Liver Function Tests: Recent Labs  Lab 04/17/18 1246 04/18/18 0847  AST 23 25  ALT 21 21  ALKPHOS 51 47  BILITOT 0.8 0.8  PROT 6.1* 5.9*  ALBUMIN 3.9 3.6   No results for input(s): LIPASE, AMYLASE in the last 168 hours. No results for input(s): AMMONIA in the last 168 hours. Coagulation Profile: Recent Labs  Lab 04/17/18 1246  INR 1.05   Cardiac Enzymes: No results for input(s): CKTOTAL, CKMB, CKMBINDEX, TROPONINI in the last 168 hours. BNP (last 3 results) No  results for input(s): PROBNP in the last 8760 hours. HbA1C: Recent Labs    04/18/18 0458  HGBA1C 5.6   CBG: No results for input(s): GLUCAP in the last 168 hours. Lipid Profile: Recent Labs    04/18/18 0458  CHOL 128  HDL 67  LDLCALC 48  TRIG 67  CHOLHDL 1.9   Thyroid Function Tests: No results for input(s): TSH, T4TOTAL, FREET4, T3FREE, THYROIDAB in the last 72 hours. Anemia Panel: No results for input(s): VITAMINB12, FOLATE, FERRITIN, TIBC, IRON, RETICCTPCT in the last 72 hours. Sepsis Labs: No results for input(s): PROCALCITON, LATICACIDVEN in the last 168 hours.  Recent Results (from the past 240 hour(s))  Culture, blood (routine x 2)     Status: None (Preliminary result)   Collection Time: 04/17/18  6:40 PM  Result Value Ref Range Status   Specimen Description BLOOD LEFT ARM  Final   Special Requests   Final    BOTTLES DRAWN AEROBIC ONLY Blood Culture results may not be optimal due to an inadequate volume of blood received in culture bottles   Culture   Final     NO GROWTH < 24 HOURS Performed at [facility] [address] [place], [place] [address]    Report Status PENDING  Incomplete  Culture, blood (routine x 2)     Status: None (Preliminary result)   Collection Time: 04/17/18  6:50 PM  Result Value Ref Range Status   Specimen Description BLOOD RIGHT HAND  Final   Special Requests   Final    BOTTLES DRAWN AEROBIC ONLY Blood Culture adequate volume   Culture   Final    NO GROWTH < 24 HOURS Performed at [facility], [place] [address] [place], [place] [address]    Report Status PENDING  Incomplete    Radiology Studies: Ct Angio Head W Or Wo Contrast  Result Date: 04/17/2018 CLINICAL DATA:  Last seen normal 2300 hours. Left-sided weakness. Right-sided facial droop. EXAM: CT ANGIOGRAPHY HEAD AND NECK TECHNIQUE: Multidetector CT imaging of the head and neck was performed using the standard protocol during bolus administration of intravenous contrast. Multiplanar CT image reconstructions and MIPs were obtained to evaluate the vascular anatomy. Carotid stenosis measurements (when applicable) are obtained utilizing NASCET criteria, using the distal internal carotid diameter as the denominator. CONTRAST:  29mL ISOVUE-370 IOPAMIDOL (ISOVUE-370) INJECTION 76% COMPARISON:  CT earlier same day. FINDINGS: CTA NECK FINDINGS Aortic arch: Aortic atherosclerosis. No aneurysm or dissection. Branching pattern is normal without origin stenosis. Brachiocephalic vessels are tortuous. Right carotid system: Common carotid artery widely patent to the bifurcation. Minimal atherosclerotic plaque at the carotid bifurcation but no stenosis. Cervical ICA is widely patent, though tortuous. Left carotid system: Common carotid artery widely patent to the bifurcation. Carotid bifurcation shows minimal atherosclerotic plaque. No stenosis or irregularity. Cervical ICA is tortuous but widely patent. Vertebral arteries: Both vertebral artery origins are widely patent. The  vertebral arteries are widely patent through the cervical region to the foramen magnum. Skeleton: Normal Other neck: No mass or lymphadenopathy. Upper chest: Mild pleural and parenchymal scarring at the apices. Review of the MIP images confirms the above findings CTA HEAD FINDINGS Anterior circulation: Both internal carotid arteries are patent through the skull base and siphon regions. There is siphon calcification but no stenosis. The anterior and middle cerebral vessels are patent without proximal stenosis, aneurysm or vascular malformation. No large or medium vessel occlusion. Posterior circulation: Both vertebral arteries are widely patent to the basilar. No basilar  stenosis. Posterior circulation branch vessels are patent. Patent posterior communicating arteries on each side. Venous sinuses: Patent and normal. Anatomic variants: None significant. Delayed phase: No abnormal enhancement. Review of the MIP images confirms the above findings IMPRESSION: No large or medium vessel occlusion or correctable proximal stenosis. These results were called by telephone at the time of interpretation on 04/17/2018 at 1:18 pm to Dr. Ray Church , who verbally acknowledged these results. Electronically Signed   By: Nelson Chimes M.D.   On: 04/17/2018 13:19   Ct Angio Neck W Or Wo Contrast  Result Date: 04/17/2018 CLINICAL DATA:  Last seen normal 2300 hours. Left-sided weakness. Right-sided facial droop. EXAM: CT ANGIOGRAPHY HEAD AND NECK TECHNIQUE: Multidetector CT imaging of the head and neck was performed using the standard protocol during bolus administration of intravenous contrast. Multiplanar CT image reconstructions and MIPs were obtained to evaluate the vascular anatomy. Carotid stenosis measurements (when applicable) are obtained utilizing NASCET criteria, using the distal internal carotid diameter as the denominator. CONTRAST:  44mL ISOVUE-370 IOPAMIDOL (ISOVUE-370) INJECTION 76% COMPARISON:  CT earlier same day.  FINDINGS: CTA NECK FINDINGS Aortic arch: Aortic atherosclerosis. No aneurysm or dissection. Branching pattern is normal without origin stenosis. Brachiocephalic vessels are tortuous. Right carotid system: Common carotid artery widely patent to the bifurcation. Minimal atherosclerotic plaque at the carotid bifurcation but no stenosis. Cervical ICA is widely patent, though tortuous. Left carotid system: Common carotid artery widely patent to the bifurcation. Carotid bifurcation shows minimal atherosclerotic plaque. No stenosis or irregularity. Cervical ICA is tortuous but widely patent. Vertebral arteries: Both vertebral artery origins are widely patent. The vertebral arteries are widely patent through the cervical region to the foramen magnum. Skeleton: Normal Other neck: No mass or lymphadenopathy. Upper chest: Mild pleural and parenchymal scarring at the apices. Review of the MIP images confirms the above findings CTA HEAD FINDINGS Anterior circulation: Both internal carotid arteries are patent through the skull base and siphon regions. There is siphon calcification but no stenosis. The anterior and middle cerebral vessels are patent without proximal stenosis, aneurysm or vascular malformation. No large or medium vessel occlusion. Posterior circulation: Both vertebral arteries are widely patent to the basilar. No basilar stenosis. Posterior circulation branch vessels are patent. Patent posterior communicating arteries on each side. Venous sinuses: Patent and normal. Anatomic variants: None significant. Delayed phase: No abnormal enhancement. Review of the MIP images confirms the above findings IMPRESSION: No large or medium vessel occlusion or correctable proximal stenosis. These results were called by telephone at the time of interpretation on 04/17/2018 at 1:18 pm to Dr. Ray Church , who verbally acknowledged these results. Electronically Signed   By: Nelson Chimes M.D.   On: 04/17/2018 13:19   Ct Head Code  Stroke Wo Contrast  Result Date: 04/17/2018 CLINICAL DATA:  Code stroke. Last seen normal 2300 hours. Left-sided weakness. Right-sided facial droop. EXAM: CT HEAD WITHOUT CONTRAST TECHNIQUE: Contiguous axial images were obtained from the base of the skull through the vertex without intravenous contrast. COMPARISON:  04/11/2018 FINDINGS: Brain: Advanced chronic small-vessel ischemic changes throughout the brain. No sign of acute infarction, mass lesion, hemorrhage, hydrocephalus or extra-axial collection. Vascular: There is atherosclerotic calcification of the major vessels at the base of the brain. Skull: Negative Sinuses/Orbits: Clear/normal Other: None ASPECTS ([place] Stroke Program Early CT Score) - Ganglionic level infarction (caudate, lentiform nuclei, internal capsule, insula, M1-M3 cortex): 7 - Supraganglionic infarction (M4-M6 cortex): 3 Total score (0-10 with 10 being normal): 10 IMPRESSION: 1. No acute finding  by CT. Advanced chronic small-vessel ischemic changes throughout the brain. 2. ASPECTS is 10. * These results were called by telephone at the time of interpretation on 04/17/2018 at 1:02 pm to Dr. Marcelle Overlie , who verbally acknowledged these results. Electronically Signed   By: Nelson Chimes M.D.   On: 04/17/2018 13:03   Scheduled Meds: .  stroke: mapping our early stages of recovery book   Does not apply Once  . aspirin EC  81 mg Oral Daily  . atorvastatin  5 mg Oral QPM  . clopidogrel  75 mg Oral Daily  . gabapentin  100 mg Oral QHS  . heparin  5,000 Units Subcutaneous Q8H  . multivitamin with minerals  1 tablet Oral Daily  . rOPINIRole  0.5 mg Oral q1800  . rOPINIRole  1 mg Oral QHS  . vitamin B-12  2,500 mcg Oral BH-q7a   Continuous Infusions:   LOS: 0 days   Kerney Elbe, DO [facility] PAGER is on AMION  If 7PM-7AM, please contact night-coverage [email or web address] Password [facility] 04/18/2018, 3:17 PM

## 2018-04-18 NOTE — Progress Notes (Signed)
Patient had about 20-24 beats of SVT at sleep, doctor on call paged , call not received yet at the time of report. Day shift nurse notified of the beats will continue follow up.

## 2018-04-18 NOTE — Progress Notes (Addendum)
STROKE TEAM PROGRESS NOTE   INTERVAL HISTORY Her daughter and son in law are  at the bedside.  Patient is awake, alert, NAD.   Vitals:   04/17/18 2230 04/18/18 0015 04/18/18 0359 04/18/18 0733  BP: (!) 135/50 (!) 107/43 (!) 133/47 (!) 138/59  Pulse: 81 79 67 69  Resp:   20 18  Temp: 97.8 F (36.6 C)  99.3 F (37.4 C) 98.7 F (37.1 C)  TempSrc: Oral  Oral Oral  SpO2: 97%  99% 95%    CBC:  Recent Labs  Lab 04/17/18 1246 04/17/18 1251 04/18/18 0847  WBC 6.6  --  8.2  NEUTROABS 4.4  --  5.7  HGB 12.5 12.9 12.1  HCT 39.4 38.0 36.7  MCV 94.7  --  91.3  PLT 163  --  174    Basic Metabolic Panel:  Recent Labs  Lab 04/17/18 1246 04/17/18 1251 04/18/18 0847  NA 139 138 134*  K 4.2 4.1 3.9  CL 105 104 102  CO2 25  --  23  GLUCOSE 105* 101* 152*  BUN 14 16 15   CREATININE 0.89 0.80 0.92  CALCIUM 9.3  --  8.7*  MG  --   --  1.8  PHOS  --   --  3.3   Lipid Panel:     Component Value Date/Time   CHOL 128 04/18/2018 0458   TRIG 67 04/18/2018 0458   HDL 67 04/18/2018 0458   CHOLHDL 1.9 04/18/2018 0458   VLDL 13 04/18/2018 0458   LDLCALC 48 04/18/2018 0458   HgbA1c:  Lab Results  Component Value Date   HGBA1C 5.6 04/18/2018   Urine Drug Screen:     Component Value Date/Time   LABOPIA NONE DETECTED 04/17/2018 1340   COCAINSCRNUR NONE DETECTED 04/17/2018 1340   LABBENZ NONE DETECTED 04/17/2018 1340   AMPHETMU NONE DETECTED 04/17/2018 1340   THCU NONE DETECTED 04/17/2018 1340   LABBARB NONE DETECTED 04/17/2018 1340    Alcohol Level     Component Value Date/Time   ETH <10 04/17/2018 1246    IMAGING Ct Angio Head W Or Wo Contrast  Result Date: 04/17/2018 CLINICAL DATA:  Last seen normal 2300 hours. Left-sided weakness. Right-sided facial droop. EXAM: CT ANGIOGRAPHY HEAD AND NECK TECHNIQUE: Multidetector CT imaging of the head and neck was performed using the standard protocol during bolus administration of intravenous contrast. Multiplanar CT image  reconstructions and MIPs were obtained to evaluate the vascular anatomy. Carotid stenosis measurements (when applicable) are obtained utilizing NASCET criteria, using the distal internal carotid diameter as the denominator. CONTRAST:  8mL ISOVUE-370 IOPAMIDOL (ISOVUE-370) INJECTION 76% COMPARISON:  CT earlier same day. FINDINGS: CTA NECK FINDINGS Aortic arch: Aortic atherosclerosis. No aneurysm or dissection. Branching pattern is normal without origin stenosis. Brachiocephalic vessels are tortuous. Right carotid system: Common carotid artery widely patent to the bifurcation. Minimal atherosclerotic plaque at the carotid bifurcation but no stenosis. Cervical ICA is widely patent, though tortuous. Left carotid system: Common carotid artery widely patent to the bifurcation. Carotid bifurcation shows minimal atherosclerotic plaque. No stenosis or irregularity. Cervical ICA is tortuous but widely patent. Vertebral arteries: Both vertebral artery origins are widely patent. The vertebral arteries are widely patent through the cervical region to the foramen magnum. Skeleton: Normal Other neck: No mass or lymphadenopathy. Upper chest: Mild pleural and parenchymal scarring at the apices. Review of the MIP images confirms the above findings CTA HEAD FINDINGS Anterior circulation: Both internal carotid arteries are patent through the skull base and  siphon regions. There is siphon calcification but no stenosis. The anterior and middle cerebral vessels are patent without proximal stenosis, aneurysm or vascular malformation. No large or medium vessel occlusion. Posterior circulation: Both vertebral arteries are widely patent to the basilar. No basilar stenosis. Posterior circulation branch vessels are patent. Patent posterior communicating arteries on each side. Venous sinuses: Patent and normal. Anatomic variants: None significant. Delayed phase: No abnormal enhancement. Review of the MIP images confirms the above findings  IMPRESSION: No large or medium vessel occlusion or correctable proximal stenosis. These results were called by telephone at the time of interpretation on 04/17/2018 at 1:18 pm to Dr. Ray Church , who verbally acknowledged these results. Electronically Signed   By: Nelson Chimes M.D.   On: 04/17/2018 13:19   Ct Angio Neck W Or Wo Contrast  Result Date: 04/17/2018 CLINICAL DATA:  Last seen normal 2300 hours. Left-sided weakness. Right-sided facial droop. EXAM: CT ANGIOGRAPHY HEAD AND NECK TECHNIQUE: Multidetector CT imaging of the head and neck was performed using the standard protocol during bolus administration of intravenous contrast. Multiplanar CT image reconstructions and MIPs were obtained to evaluate the vascular anatomy. Carotid stenosis measurements (when applicable) are obtained utilizing NASCET criteria, using the distal internal carotid diameter as the denominator. CONTRAST:  77mL ISOVUE-370 IOPAMIDOL (ISOVUE-370) INJECTION 76% COMPARISON:  CT earlier same day. FINDINGS: CTA NECK FINDINGS Aortic arch: Aortic atherosclerosis. No aneurysm or dissection. Branching pattern is normal without origin stenosis. Brachiocephalic vessels are tortuous. Right carotid system: Common carotid artery widely patent to the bifurcation. Minimal atherosclerotic plaque at the carotid bifurcation but no stenosis. Cervical ICA is widely patent, though tortuous. Left carotid system: Common carotid artery widely patent to the bifurcation. Carotid bifurcation shows minimal atherosclerotic plaque. No stenosis or irregularity. Cervical ICA is tortuous but widely patent. Vertebral arteries: Both vertebral artery origins are widely patent. The vertebral arteries are widely patent through the cervical region to the foramen magnum. Skeleton: Normal Other neck: No mass or lymphadenopathy. Upper chest: Mild pleural and parenchymal scarring at the apices. Review of the MIP images confirms the above findings CTA HEAD FINDINGS Anterior  circulation: Both internal carotid arteries are patent through the skull base and siphon regions. There is siphon calcification but no stenosis. The anterior and middle cerebral vessels are patent without proximal stenosis, aneurysm or vascular malformation. No large or medium vessel occlusion. Posterior circulation: Both vertebral arteries are widely patent to the basilar. No basilar stenosis. Posterior circulation branch vessels are patent. Patent posterior communicating arteries on each side. Venous sinuses: Patent and normal. Anatomic variants: None significant. Delayed phase: No abnormal enhancement. Review of the MIP images confirms the above findings IMPRESSION: No large or medium vessel occlusion or correctable proximal stenosis. These results were called by telephone at the time of interpretation on 04/17/2018 at 1:18 pm to Dr. Ray Church , who verbally acknowledged these results. Electronically Signed   By: Nelson Chimes M.D.   On: 04/17/2018 13:19   Ct Head Code Stroke Wo Contrast  Result Date: 04/17/2018 CLINICAL DATA:  Code stroke. Last seen normal 2300 hours. Left-sided weakness. Right-sided facial droop. EXAM: CT HEAD WITHOUT CONTRAST TECHNIQUE: Contiguous axial images were obtained from the base of the skull through the vertex without intravenous contrast. COMPARISON:  04/11/2018 FINDINGS: Brain: Advanced chronic small-vessel ischemic changes throughout the brain. No sign of acute infarction, mass lesion, hemorrhage, hydrocephalus or extra-axial collection. Vascular: There is atherosclerotic calcification of the major vessels at the base of the brain. Skull: Negative  Sinuses/Orbits: Clear/normal Other: None ASPECTS ([place] Stroke Program Early CT Score) - Ganglionic level infarction (caudate, lentiform nuclei, internal capsule, insula, M1-M3 cortex): 7 - Supraganglionic infarction (M4-M6 cortex): 3 Total score (0-10 with 10 being normal): 10 IMPRESSION: 1. No acute finding by CT. Advanced chronic  small-vessel ischemic changes throughout the brain. 2. ASPECTS is 10. * These results were called by telephone at the time of interpretation on 04/17/2018 at 1:02 pm to Dr. Marcelle Overlie , who verbally acknowledged these results. Electronically Signed   By: Nelson Chimes M.D.   On: 04/17/2018 13:03    PHYSICAL EXAM Constitutional: Appears well-developed and well-nourished.  Psych: Affect appropriate to situation Eyes: No scleral injection HENT: No OP obstrucion Head: Normocephalic.  Cardiovascular: Normal rate and regular rhythm.  Respiratory: Effort normal, non-labored breathing GI: Soft.  No distension. There is no tenderness.  Skin: WDI  Neuro: Mental Status: Patient is awake, alert, oriented to person, place, year, and situation.  Cranial Nerves: II: Visual Fields are full. Pupils are equal, round, and reactive to light.   III,IV, VI: EOMI without ptosis or diploplia.  V: Facial sensation is symmetric to temperature VII: mild L facial droop VIII: hearing is intact to voice X: Uvula elevates symmetrically XI: Shoulder shrug is symmetric. XII: tongue is midline without atrophy or fasciculations.  Motor: Tone is normal. Bulk is normal. 5/5 strength was present in all four extremities. hand grips equal.  Sensory: Sensation is symmetric to light touch and temperature in the arms and legs.  Deep Tendon Reflexes: 2+ and symmetric in the biceps and patellae.  Plantars: Toes are downgoing bilaterally.  Cerebellar: FNF and HKS are intact bilaterally  ASSESSMENT/PLAN Ms. Valerie Barker is a 85 y.o. female with history of TIA, CVA ( 2011), HTN presenting with visual disturbance.  CTA: negative for LVO. MRI pending. Not a candidate for Tpa d/t being outside of the window. Initial NIHSS:7 mRs: 3  Stroke vs TIA:  Complete stroke work-up pending  Code Stroke 1. No acute finding by CT. Advanced chronic small-vessel ischemicchanges throughout the brain. ASPECTS is 10.    CTA head & neck  :No LVO or medium vessel occlusion or proximal stenosis  MRI  Pending  2D Echo  pending  LDL 48  HgbA1c 5.6  Heparin for VTE prophylaxis Diet Order            Diet regular Room service appropriate? Yes; Fluid consistency: Thin  Diet effective now               clopidogrel 75 mg daily prior to admission, now on aspirin 81 mg daily and clopidogrel 75 mg daily.  ASA and Plavix for 3 weeks, and then stop ASA and continue Plavix.   Therapy recommendations:  CIR vs SNF : pending   Disposition:  Pending   Hypertension  Stable . Permissive hypertension (OK if < 220/120) but gradually normalize in 5-7 days . Long-term BP goal normotensive  Hyperlipidemia  Home meds:  Lipitor 10 mg daily  Not resumed in hospital  LDL 48, goal < 70  Add Lipitor 5 mg daily  Continue statin at discharge  Diabetes type II  HgbA1c 5.6, goal < 7.0  Controlled  Other Stroke Risk Factors  Advanced age  Hx stroke/TIA  Family hx stroke (Father)  Other Active Problems  Urinary retention : bladder scan ordered Q6Hr  Short-term memory loss; continuing home regimen  Hospital day # 0  Laurey Morale, MSN, NP-C [facility] [phone]  ATTENDING  NOTE: I reviewed above note and agree with the assessment and plan. Pt was seen and examined.   85 year old female with history of hypertension, restless leg syndrome, lower back pain, stroke and TIA admitted for episode of left hemianopia and left neglect as well as left facial droop.  Symptoms resolved overnight.  On examination today, neuro intact.  CT negative, CTA head and neck unremarkable.  MRI no acute infarct.  2D echo EF 60 to 65%.  LDL 48 and A1c 5.6.  Creatinine 0.8.  Patient presentation consistent with TIA, given lack of significant risk factors and no significant the neuroimaging findings, highly suspicious for undiagnosed A. fib.  Recommend DAPT with aspirin 81 and Plavix for 3 weeks and then Plavix alone.   Continue Lipitor.  Recommend 30-day CardioNet monitoring as outpatient to rule out A. fib.  Patient has her cardiologist Dr. Claiborne Billings for follow-up.  Neurology will sign off. Please call with questions. Pt will follow up with stroke clinic Dr. Leta Baptist at [facility] in about 4 weeks. Thanks for the consult.   Rosalin Hawking, MD PhD Stroke Neurology 04/18/2018 6:18 PM  I spent  35 minutes in total face-to-face time with the patient, more than 50% of which was spent in counseling and coordination of care, reviewing test results, images and medication, and discussing the diagnosis of TIA and concerning of undiagnosed A. fib, treatment plan and potential prognosis. This patient's care requiresreview of multiple databases, neurological assessment, discussion with family, other specialists and medical decision making of high complexity. I had long discussion with patient and daughter at bedside, updated pt current condition, treatment plan and potential prognosis. They expressed understanding and appreciation.          To contact Stroke Continuity provider, please refer to [email or web address] After hours, contact General Neurology

## 2018-04-18 NOTE — Evaluation (Signed)
Speech Language Pathology Evaluation Patient Details Name: Zahira Brummond MRN: [id number] DOB: 16-Aug-1922 Today's Date: 04/18/2018 Time: 3704-8889 SLP Time Calculation (min) (ACUTE ONLY): 10 min  Problem List:  Patient Active Problem List   Diagnosis Date Noted  . Stroke (cerebrum) ([place]) 04/17/2018  . Acute urinary retention 04/17/2018  . Restless leg syndrome 08/26/2015  . Hyperlipidemia 08/26/2015  . Essential hypertension 02/06/2015  . Dizziness 02/06/2015  . Sinus bradycardia 02/06/2015  . Lower extremity edema 08/05/2014  . Short-term memory loss 02/01/2014  . Palpitation 06/03/2013  . TIA (transient ischemic attack) 01/28/2013  . Dyslipidemia 01/28/2013  . HTN (hypertension) 01/28/2013  . Arm numbness 01/28/2013   Past Medical History:  Past Medical History:  Diagnosis Date  . Heart disease   . Hypertension   . IBS (irritable bowel syndrome)   . Stroke ([place])    around 2011  . TIA (transient ischemic attack)    Past Surgical History:  Past Surgical History:  Procedure Laterality Date  . ABDOMINAL HYSTERECTOMY  1978  . BUNIONECTOMY  2006, 1996  . LAPAROSCOPIC SALPINGO OOPHERECTOMY  1988   HPI:  Ms. Tanzie Rothschild is a 85 y.o. female with history of TIA, CVA ( 2011), HTN presenting with visual disturbance.  CTA: negative for LVO. MRI pending. No acute finding by CT. Advanced chronic small-vessel ischemic changes throughout the brain   Assessment / Plan / Recommendation Clinical Impression  Pt presents with much improved ability over initial admission to hospital. Pt is alert and oriented x 4 and her congitive function appears appropriate for all tasks. ST to sign off at this time.    SLP Assessment  SLP Recommendation/Assessment: Patient does not need any further Speech Lanaguage Pathology Services SLP Visit Diagnosis: Cognitive communication deficit (R41.841)    Follow Up Recommendations  None    Frequency and Duration           SLP  Evaluation Cognition  Overall Cognitive Status: Within Functional Limits for tasks assessed Arousal/Alertness: Awake/alert Orientation Level: Oriented X4       Comprehension  Auditory Comprehension Overall Auditory Comprehension: Appears within functional limits for tasks assessed Visual Recognition/Discrimination Discrimination: Not tested Reading Comprehension Reading Status: Not tested    Expression Expression Primary Mode of Expression: Verbal Verbal Expression Overall Verbal Expression: Appears within functional limits for tasks assessed   Oral / Motor  Oral Motor/Sensory Function Overall Oral Motor/Sensory Function: Within functional limits Motor Speech Overall Motor Speech: Appears within functional limits for tasks assessed Respiration: Within functional limits Phonation: Normal   GO                    Laressa Bolinger 04/18/2018, 1:09 PM

## 2018-04-18 NOTE — Progress Notes (Signed)
c 

## 2018-04-18 NOTE — Progress Notes (Signed)
Valerie Barker received staff message result from neuro requesting 30 day event monitor for stroke and requested I place order. I have done so, and she will forward request to office to arrange. Dayna Dunn PA-C

## 2018-04-18 NOTE — Evaluation (Signed)
Occupational Therapy Evaluation Patient Details Name: Valerie Barker MRN: [id number] DOB: [birth year] Today's Date: 04/18/2018    History of Present Illness Pt is a 85 y/o female presenting with visual deficits and L sided numbness. Workup pending. CT negative for acute abnormailty. Awaiting MRI. PMH includes HTN and CVA.    Clinical Impression   Patient presenting with decreased I in self care, balance, functional mobility/transfers, safety awareness, strengthening, and endurance. Patient was Mod I PTA. Patient currently functioning at min A overall. Patient will benefit from acute OT to increase overall independence in the areas of ADLs, functional mobility, and safety awareness in order to safely discharge to next venue of care. She would benefit greatly from CIR to increase I before returning to home environment.    Follow Up Recommendations  CIR    Equipment Recommendations  None recommended by OT    Recommendations for Other Services Rehab consult     Precautions / Restrictions Precautions Precautions: Fall Precaution Comments: Had a fall a week ago  Restrictions Weight Bearing Restrictions: No      Mobility Bed Mobility Overal bed mobility: Needs Assistance Bed Mobility: Sit to Supine;Supine to Sit     Supine to sit: Min assist     General bed mobility comments: min A with HOB elevated and min cuing for technique  Transfers Overall transfer level: Needs assistance Equipment used: Rolling walker (2 wheeled) Transfers: Sit to/from Stand Sit to Stand: Min assist         General transfer comment: min A with second person needed for safety and management of equipment    Balance Overall balance assessment: Needs assistance Sitting-balance support: Bilateral upper extremity supported;Feet supported Sitting balance-Leahy Scale: Fair Sitting balance - Comments: close supervision for static sitting balance     Standing balance-Leahy Scale: Poor Standing  balance comment: min A and reliance on RW                           ADL either performed or assessed with clinical judgement   ADL Overall ADL's : Needs assistance/impaired         Upper Body Bathing: Set up;Sitting   Lower Body Bathing: Minimal assistance;Sit to/from stand   Upper Body Dressing : Set up;Sitting   Lower Body Dressing: Minimal assistance;Sit to/from stand   Toilet Transfer: Minimal assistance;RW   Toileting- Clothing Manipulation and Hygiene: Minimal assistance;Sit to/from stand       Functional mobility during ADLs: Minimal assistance;Rolling walker       Vision Baseline Vision/History: No visual deficits Patient Visual Report: No change from baseline Vision Assessment?: No apparent visual deficits            Pertinent Vitals/Pain Pain Assessment: No/denies pain     Hand Dominance Right   Extremity/Trunk Assessment Upper Extremity Assessment Upper Extremity Assessment: Generalized weakness   Lower Extremity Assessment Lower Extremity Assessment: Defer to PT evaluation   Cervical / Trunk Assessment Cervical / Trunk Assessment: Kyphotic   Communication Communication Communication: No difficulties   Cognition Arousal/Alertness: Awake/alert Behavior During Therapy: WFL for tasks assessed/performed Overall Cognitive Status: Within Functional Limits for tasks assessed                   Home Living Family/patient expects to be discharged to:: Private residence Living Arrangements: Alone Available Help at Discharge: Family;Available PRN/intermittently Type of Home: Independent living facility Home Access: Elevator     Home Layout: One level  Bathroom Shower/Tub: Teacher, early years/pre: Handicapped height     Home Equipment: Environmental consultant - 2 wheels;Cane - single point          Prior Functioning/Environment Level of Independence: Independent with assistive device(s)        Comments: Pt used RW and  cane. Per daughter pt was driving up until the last week.         OT Problem List: Decreased strength;Impaired balance (sitting and/or standing);Decreased knowledge of precautions;Pain;Decreased safety awareness;Decreased activity tolerance;Decreased coordination;Impaired UE functional use      OT Treatment/Interventions: Self-care/ADL training;Therapeutic exercise;Patient/family education;Neuromuscular education;Balance training;Energy conservation;Therapeutic activities;DME and/or AE instruction    OT Goals(Current goals can be found in the care plan section) Acute Rehab OT Goals Patient Stated Goal: to regain independence OT Goal Formulation: With patient/family Time For Goal Achievement: 05/02/18 Potential to Achieve Goals: Good ADL Goals Pt Will Perform Lower Body Bathing: with modified independence Pt Will Perform Lower Body Dressing: with modified independence Pt Will Transfer to Toilet: with modified independence Pt Will Perform Toileting - Clothing Manipulation and hygiene: with modified independence Pt Will Perform Tub/Shower Transfer: with modified independence  OT Frequency: Min 2X/week   Barriers to D/C: Other (comment)  none at this time       Co-evaluation PT/OT/SLP Co-Evaluation/Treatment: Yes Reason for Co-Treatment: To address functional/ADL transfers;For patient/therapist safety   OT goals addressed during session: ADL's and self-care;Strengthening/ROM      AM-PAC OT "6 Clicks" Daily Activity     Outcome Measure Help from another person eating meals?: None Help from another person taking care of personal grooming?: A Little Help from another person toileting, which includes using toliet, bedpan, or urinal?: A Little Help from another person bathing (including washing, rinsing, drying)?: A Little Help from another person to put on and taking off regular upper body clothing?: A Little Help from another person to put on and taking off regular lower body  clothing?: A Little 6 Click Score: 19   End of Session Equipment Utilized During Treatment: Gait belt;Rolling walker  Activity Tolerance: Patient tolerated treatment well Patient left: in chair;with call bell/phone within reach;with chair alarm set;with family/visitor present  OT Visit Diagnosis: Unsteadiness on feet (R26.81);History of falling (Z91.81);Muscle weakness (generalized) (M62.81)                Time: 9629-5284 OT Time Calculation (min): 31 min Charges:  OT General Charges $OT Visit: 1 Visit OT Evaluation $OT Eval Low Complexity: 1 Low   Valerie Barker P, MS, OTR/L 04/18/2018, 3:41 PM

## 2018-04-18 NOTE — Progress Notes (Signed)
Please call daughter for updates [phone]

## 2018-04-18 NOTE — Progress Notes (Signed)
Echocardiogram 2D Echocardiogram has been performed.  04/18/2018 2:05 PM Maudry Mayhew, MHA, RVT, RDCS, RDMS

## 2018-04-18 NOTE — Progress Notes (Signed)
Physical Therapy Treatment Patient Details Name: Valerie Barker MRN: [id number] DOB: 06/01/1922 Today's Date: 04/18/2018    History of Present Illness Pt is a 85 y/o female presenting with visual deficits and L sided numbness. Workup pending. CT negative for acute abnormailty. Awaiting MRI. PMH includes HTN and CVA.     PT Comments    Pt performed gait training and functional mobility with improved tolerance to activity.  She continues to benefit from CIR therapies for aggressive rehab to improve strength and function before returning home.  Pt and her daughters are aware she cannot manage at home alone in her current state.  They are open to CIR therapies and if denied they will utilize a Biomedical engineer and HHPT.  Based on presentation she will benefit greatly from short stay at [facility].     Follow Up Recommendations  Other (comment);Supervision/Assistance - 24 hour;CIR(If denied CIR will require HHPT as she is refusing SNF placement.  )     Equipment Recommendations       Recommendations for Other Services       Precautions / Restrictions Precautions Precautions: Fall Precaution Comments: Had a fall a week ago  Restrictions Weight Bearing Restrictions: No    Mobility  Bed Mobility Overal bed mobility: Needs Assistance Bed Mobility: Sit to Supine;Supine to Sit     Supine to sit: Min assist     General bed mobility comments: min A with HOB elevated and min cuing for technique  Transfers Overall transfer level: Needs assistance Equipment used: Rolling walker (2 wheeled) Transfers: Sit to/from Stand Sit to Stand: Min assist         General transfer comment: min A with second person needed for safety and management of equipment  Ambulation/Gait Ambulation/Gait assistance: Min assist;+2 safety/equipment Gait Distance (Feet): 20 Feet(reports feeling weak and slightly dizzy.  ) Assistive device: Rolling walker (2 wheeled) Gait Pattern/deviations: Step-through  pattern;Trunk flexed;Shuffle Gait velocity: Decreased    General Gait Details: Cues for RW safety, increasing step length and stepping closer to device.  Pt gets distracted and required assistance for obstacle negotiation   Stairs             Wheelchair Mobility    Modified Rankin (Stroke Patients Only) Modified Rankin (Stroke Patients Only) Pre-Morbid Rankin Score: No significant disability Modified Rankin: Moderately severe disability     Balance Overall balance assessment: Needs assistance Sitting-balance support: Bilateral upper extremity supported;Feet supported Sitting balance-Leahy Scale: Fair Sitting balance - Comments: close supervision for static sitting balance     Standing balance-Leahy Scale: Poor Standing balance comment: min A and reliance on RW                            Cognition Arousal/Alertness: Awake/alert Behavior During Therapy: WFL for tasks assessed/performed Overall Cognitive Status: Within Functional Limits for tasks assessed                                 General Comments: Pt was very sleepy throughout session. Was slow to respond, but was A&O X4. Was accurate in giving home information as well.       Exercises      General Comments        Pertinent Vitals/Pain Pain Assessment: No/denies pain    Home Living Family/patient expects to be discharged to:: Private residence Living Arrangements: Alone Available Help at Discharge: Family;Available PRN/intermittently  Type of Home: Independent living facility Home Access: Elevator   Home Layout: One level Home Equipment: [place] - 2 wheels;Cane - single point      Prior Function Level of Independence: Independent with assistive device(s)      Comments: Pt used RW and cane. Per daughter pt was driving up until the last week.    PT Goals (current goals can now be found in the care plan section) Acute Rehab PT Goals Patient Stated Goal: to regain  independence Potential to Achieve Goals: Good Progress towards PT goals: Progressing toward goals    Frequency    Min 4X/week      PT Plan Current plan remains appropriate    Co-evaluation PT/OT/SLP Co-Evaluation/Treatment: Yes Reason for Co-Treatment: Complexity of the patient's impairments (multi-system involvement);Necessary to address cognition/behavior during functional activity PT goals addressed during session: Mobility/safety with mobility OT goals addressed during session: ADL's and self-care      AM-PAC PT "6 Clicks" Mobility   Outcome Measure  Help needed turning from your back to your side while in a flat bed without using bedrails?: A Little Help needed moving from lying on your back to sitting on the side of a flat bed without using bedrails?: A Little Help needed moving to and from a bed to a chair (including a wheelchair)?: A Little Help needed standing up from a chair using your arms (e.g., wheelchair or bedside chair)?: A Little Help needed to walk in hospital room?: A Little Help needed climbing 3-5 steps with a railing? : A Little 6 Click Score: 18    End of Session Equipment Utilized During Treatment: Gait belt Activity Tolerance: Patient limited by lethargy Patient left: in bed;with call bell/phone within reach;with family/visitor present Nurse Communication: Mobility status PT Visit Diagnosis: Unsteadiness on feet (R26.81);Muscle weakness (generalized) (M62.81);Other symptoms and signs involving the nervous system (R29.898)     Time: 1610-9604 PT Time Calculation (min) (ACUTE ONLY): 31 min  Charges:  $Gait Training: 8-22 mins                     Governor Rooks, PTA Acute Rehabilitation Services Pager [phone] Office [phone]     Valerie Barker Eli Hose 04/18/2018, 4:41 PM

## 2018-04-19 DIAGNOSIS — R338 Other retention of urine: Secondary | ICD-10-CM | POA: Diagnosis not present

## 2018-04-19 DIAGNOSIS — G459 Transient cerebral ischemic attack, unspecified: Secondary | ICD-10-CM | POA: Diagnosis not present

## 2018-04-19 DIAGNOSIS — R413 Other amnesia: Secondary | ICD-10-CM | POA: Diagnosis not present

## 2018-04-19 DIAGNOSIS — E785 Hyperlipidemia, unspecified: Secondary | ICD-10-CM | POA: Diagnosis not present

## 2018-04-19 DIAGNOSIS — R269 Unspecified abnormalities of gait and mobility: Secondary | ICD-10-CM

## 2018-04-19 DIAGNOSIS — I10 Essential (primary) hypertension: Secondary | ICD-10-CM | POA: Diagnosis not present

## 2018-04-19 DIAGNOSIS — R0989 Other specified symptoms and signs involving the circulatory and respiratory systems: Secondary | ICD-10-CM | POA: Diagnosis not present

## 2018-04-19 LAB — CBC WITH DIFFERENTIAL/PLATELET
Abs Immature Granulocytes: 0.04 10*3/uL (ref 0.00–0.07)
Basophils Absolute: 0 10*3/uL (ref 0.0–0.1)
Basophils Relative: 0 %
Eosinophils Absolute: 0.2 10*3/uL (ref 0.0–0.5)
Eosinophils Relative: 2 %
HCT: 35.4 % — ABNORMAL LOW (ref 36.0–46.0)
Hemoglobin: 11.6 g/dL — ABNORMAL LOW (ref 12.0–15.0)
Immature Granulocytes: 1 %
Lymphocytes Relative: 28 %
Lymphs Abs: 1.8 10*3/uL (ref 0.7–4.0)
MCH: 29.7 pg (ref 26.0–34.0)
MCHC: 32.8 g/dL (ref 30.0–36.0)
MCV: 90.8 fL (ref 80.0–100.0)
Monocytes Absolute: 0.8 10*3/uL (ref 0.1–1.0)
Monocytes Relative: 12 %
NEUTROS ABS: 3.7 10*3/uL (ref 1.7–7.7)
Neutrophils Relative %: 57 %
Platelets: 164 10*3/uL (ref 150–400)
RBC: 3.9 MIL/uL (ref 3.87–5.11)
RDW: 14.2 % (ref 11.5–15.5)
WBC: 6.6 10*3/uL (ref 4.0–10.5)
nRBC: 0 % (ref 0.0–0.2)

## 2018-04-19 LAB — COMPREHENSIVE METABOLIC PANEL
ALT: 20 U/L (ref 0–44)
AST: 25 U/L (ref 15–41)
Albumin: 3.3 g/dL — ABNORMAL LOW (ref 3.5–5.0)
Alkaline Phosphatase: 42 U/L (ref 38–126)
Anion gap: 9 (ref 5–15)
BUN: 13 mg/dL (ref 8–23)
CO2: 24 mmol/L (ref 22–32)
CREATININE: 0.9 mg/dL (ref 0.44–1.00)
Calcium: 8.9 mg/dL (ref 8.9–10.3)
Chloride: 105 mmol/L (ref 98–111)
GFR calc Af Amer: >60 mL/min (ref >60)
GFR calc non Af Amer: 55 mL/min — ABNORMAL LOW (ref >60)
Glucose, Bld: 104 mg/dL — ABNORMAL HIGH (ref 70–99)
Potassium: 3.8 mmol/L (ref 3.5–5.1)
Sodium: 138 mmol/L (ref 135–145)
Total Bilirubin: 0.8 mg/dL (ref 0.3–1.2)
Total Protein: 5.7 g/dL — ABNORMAL LOW (ref 6.5–8.1)

## 2018-04-19 LAB — PHOSPHORUS: Phosphorus: 3 mg/dL (ref 2.5–4.6)

## 2018-04-19 LAB — MAGNESIUM: Magnesium: 1.9 mg/dL (ref 1.7–2.4)

## 2018-04-19 MED ORDER — SENNOSIDES-DOCUSATE SODIUM 8.6-50 MG PO TABS: 1.0000 | ORAL_TABLET | Freq: Every evening | ORAL | 0 refills | Status: DC | PRN

## 2018-04-19 MED ORDER — ASPIRIN 81 MG PO TBEC: 81.0000 mg | DELAYED_RELEASE_TABLET | Freq: Every day | ORAL | 0 refills | Status: DC

## 2018-04-19 MED ORDER — GABAPENTIN 100 MG PO CAPS: 100.0000 mg | ORAL_CAPSULE | Freq: Every day | ORAL | 0 refills | Status: DC

## 2018-04-19 NOTE — Progress Notes (Addendum)
Physical Therapy Treatment Patient Details Name: Valerie Barker MRN: [id number] DOB: [birth year] Today's Date: 04/19/2018    History of Present Illness Pt is a 85 y/o female presenting with visual deficits and L sided numbness. Workup pending. CT negative for acute abnormailty. Awaiting MRI. PMH includes HTN and CVA.     PT Comments    Pt performed gait training and functional mobility with improved progress and requiring significant decrease in assistance.  Pt able to perform standing exercises at sink with good righting responses with LOB.  Informed CM of need for equipment.  Will inform supervising PT of change in recommendations at this time.  Pt to d/c home today after denial of CIR.  Based on her improvements in mobility HHPT is most appropriate at this time.    Follow Up Recommendations  Home health PT;Supervision/Assistance - 24 hour     Equipment Recommendations  Rolling walker with 5" wheels;3in1 (PT)(youth height RW.  )    Recommendations for Other Services       Precautions / Restrictions Precautions Precautions: Fall Precaution Comments: Had a fall a week ago  Restrictions Weight Bearing Restrictions: No    Mobility  Bed Mobility Overal bed mobility: Needs Assistance Bed Mobility: Sit to Supine;Supine to Sit     Supine to sit: Supervision     General bed mobility comments: Pt seated in recliner chair on arrival.    Transfers Overall transfer level: Needs assistance Equipment used: Rolling walker (2 wheeled) Transfers: Sit to/from Stand Sit to Stand: Supervision         General transfer comment: Cues for hand placement to and from seated surface.  Pt reaching for RW for support.    Ambulation/Gait Ambulation/Gait assistance: Supervision Gait Distance (Feet): 150 Feet Assistive device: Rolling walker (2 wheeled) Gait Pattern/deviations: Step-through pattern;Trunk flexed Gait velocity: Decreased    General Gait Details: Constant cues for upper  trunk control and forward gaze.  Pt performed gait training for an additional trial of 40 ft with RW for improved determination of need for youth height device.  Pt presents with better placement in RW with youth height AD.     Stairs Stairs: (level entry into home does not require stair training.  )           Wheelchair Mobility    Modified Rankin (Stroke Patients Only)       Balance Overall balance assessment: Needs assistance Sitting-balance support: Bilateral upper extremity supported;Feet supported Sitting balance-Leahy Scale: Good     Standing balance support: Bilateral upper extremity supported;During functional activity Standing balance-Leahy Scale: Fair Standing balance comment: Reliant on RW LOB x 1 without device during standing exercises.  Able to correct LOB with support from counter.                              Cognition Arousal/Alertness: Awake/alert Behavior During Therapy: WFL for tasks assessed/performed Overall Cognitive Status: Within Functional Limits for tasks assessed                                 General Comments: Pt has some general memory issues but overall functions for independent living level.      Exercises General Exercises - Lower Extremity Long Arc Quad: AROM;Both;10 reps;Seated Hip ABduction/ADduction: AROM;Both;10 reps;Standing Hip Flexion/Marching: AROM;Both;10 reps;Standing Heel Raises: AROM;Both;10 reps;Standing Mini-Sqauts: AROM;Both;10 reps;Standing    General Comments General comments (skin  integrity, edema, etc.): Pt doing well overall.  Rehab has declined pt. Feel she would benefiit from [facility] to get a tune up in her own environment to make sure she is safe to be alone at [place]. Daughters stated they can provide 24 hour assist if needed to start.        Pertinent Vitals/Pain Pain Assessment: No/denies pain    Home Living                      Prior Function            PT  Goals (current goals can now be found in the care plan section) Acute Rehab PT Goals Patient Stated Goal: to regain independence Potential to Achieve Goals: Good Progress towards PT goals: Progressing toward goals    Frequency    Min 4X/week      PT Plan Current plan remains appropriate    Co-evaluation              AM-PAC PT "6 Clicks" Mobility   Outcome Measure                   End of Session Equipment Utilized During Treatment: Gait belt Activity Tolerance: Patient limited by lethargy Patient left: in bed;with call bell/phone within reach;with family/visitor present Nurse Communication: Mobility status PT Visit Diagnosis: Unsteadiness on feet (R26.81);Muscle weakness (generalized) (M62.81);Other symptoms and signs involving the nervous system (R29.898)     Time: 2637-8588 PT Time Calculation (min) (ACUTE ONLY): 37 min  Charges:  $Gait Training: 8-22 mins $Therapeutic Exercise: 8-22 mins                     Valerie Barker, PTA Acute Rehabilitation Services Pager [phone] Office [phone]     Valerie Barker Valerie Barker 04/19/2018, 3:02 PM

## 2018-04-19 NOTE — Care Management Note (Addendum)
Case Management Note  Patient Details  Name: Valerie Barker MRN: [id number] Date of Birth: 10-15-1922  Subjective/Objective:      Pt in with TIA. She is from [place].  Pt was going to outpatient therapy twice a week prior to admission at [place].              Action/Plan: PT/OT recommending CIR. CIR feels patient is able to d/c home with [facility] services. CM met with the patient and one of her daughters. They prefer to continue with outpatient therapy. CM updated MD and he is in agreement. CM informed the patient and daughter that she wouldn 't receive OT at [facility] and the patient wants to stay with [facility]. CM called [facility] PT and they have her on the schedule for Monday. CM inquired if any orders or resumption notes needed and they stated "no". Pt made aware of next appt. Family to provide 24 hour supervision at d/c and transport home.   Addendum: pt with orders for youth walker and 3 n 1. Jermaine with [facility] notified and will deliver to the room.  Expected Discharge Date:  04/19/18               Expected Discharge Plan:  OP Rehab  In-House Referral:     Discharge planning Services  CM Consult  Post Acute Care Choice:    Choice offered to:     DME Arranged:    DME Agency:     HH Arranged:    HH Agency:     Status of Service:  Completed, signed off  If discussed at [employer] of Stay Meetings, dates discussed:    Additional Comments:  Pollie Friar, RN 04/19/2018, 1:14 PM

## 2018-04-19 NOTE — Progress Notes (Signed)
Occupational Therapy Treatment Patient Details Name: Valerie Barker MRN: [id number] DOB: 1922/12/10 Today's Date: 04/19/2018    History of present illness Pt is a 85 y/o female presenting with visual deficits and L sided numbness. Workup pending. CT negative for acute abnormailty. Awaiting MRI. PMH includes HTN and CVA.    OT comments  Pt making good progress with OT at an overall min assist level to supervision with most adls. Pt's daughters are very supportive.  Feel she would first benefit from [facility] to really see how she does in her environment with cooking, laundry etc.  Family states they can get her the supervision she needs to go home.     Follow Up Recommendations  Home health OT;Supervision/Assistance - 24 hour    Equipment Recommendations  None recommended by OT    Recommendations for Other Services      Precautions / Restrictions Precautions Precautions: Fall Precaution Comments: Had a fall a week ago  Restrictions Weight Bearing Restrictions: No       Mobility Bed Mobility Overal bed mobility: Needs Assistance Bed Mobility: Sit to Supine;Supine to Sit     Supine to sit: Supervision     General bed mobility comments: Pt able to manage all bed mobility with rail.  Pt does have rail at home.  Transfers Overall transfer level: Needs assistance Equipment used: Rolling walker (2 wheeled) Transfers: Sit to/from Stand Sit to Stand: Min guard         General transfer comment: Pt safer coming sit to stand.  Encouraged her to sit for a few minutes before getting up and to stand for a minute before walking in case she is dizzy.    Balance Overall balance assessment: Needs assistance Sitting-balance support: Bilateral upper extremity supported;Feet supported Sitting balance-Leahy Scale: Good     Standing balance support: Bilateral upper extremity supported;During functional activity Standing balance-Leahy Scale: Fair Standing balance  comment: Pt needs RW but was able to toilet and groom letting go of walker with one hand.                           ADL either performed or assessed with clinical judgement   ADL Overall ADL's : Needs assistance/impaired Eating/Feeding: Set up;Sitting   Grooming: Wash/dry hands;Wash/dry face;Oral care;Brushing hair;Supervision/safety;Standing Grooming Details (indicate cue type and reason): Pt stood at sink for appx 10  minutes grooming without a loss of balance. Upper Body Bathing: Set up;Sitting   Lower Body Bathing: Min guard;Sit to/from stand;Cueing for safety Lower Body Bathing Details (indicate cue type and reason): Pt bathes in shower at home with a seat but does not always use the seat. Recommended use of seat when she returns home since she has had some dizziness. Upper Body Dressing : Set up;Sitting   Lower Body Dressing: Minimal assistance;Sit to/from stand   Toilet Transfer: Min guard;RW;Cueing for Office manager Details (indicate cue type and reason): Pt walked to bathroom with walker and was very safe with transfers.  One cue to push off  of the toilet when standing. Toileting- Clothing Manipulation and Hygiene: Supervision/safety;Sit to/from stand       Functional mobility during ADLs: Min guard;Rolling walker General ADL Comments: Pt doing well with adls occasionally requiring min assist.     Vision   Vision Assessment?: No apparent visual deficits   Perception     Praxis      Cognition Arousal/Alertness: Awake/alert Behavior During Therapy: WFL for tasks  assessed/performed Overall Cognitive Status: Within Functional Limits for tasks assessed                                 General Comments: Pt has some general memory issues but overall functions for independent living level.        Exercises     Shoulder Instructions       General Comments Pt doing well overall.  Rehab has declined pt. Feel she would benefiit from  [facility] to get a tune up in her own environment to make sure she is safe to be alone at [place]. Daughters stated they can provide 24 hour assist if needed to start.      Pertinent Vitals/ Pain       Pain Assessment: No/denies pain  Home Living                                          Prior Functioning/Environment              Frequency  Min 2X/week        Progress Toward Goals  OT Goals(current goals can now be found in the care plan section)  Progress towards OT goals: Progressing toward goals  Acute Rehab OT Goals Patient Stated Goal: to regain independence OT Goal Formulation: With patient/family Time For Goal Achievement: 05/02/18 Potential to Achieve Goals: Good ADL Goals Pt Will Perform Lower Body Bathing: with modified independence Pt Will Perform Lower Body Dressing: with modified independence Pt Will Transfer to Toilet: with modified independence Pt Will Perform Toileting - Clothing Manipulation and hygiene: with modified independence Pt Will Perform Tub/Shower Transfer: with modified independence  Plan Discharge plan needs to be updated    Co-evaluation                 AM-PAC OT "6 Clicks" Daily Activity     Outcome Measure   Help from another person eating meals?: None Help from another person taking care of personal grooming?: None Help from another person toileting, which includes using toliet, bedpan, or urinal?: A Little Help from another person bathing (including washing, rinsing, drying)?: A Little Help from another person to put on and taking off regular upper body clothing?: None Help from another person to put on and taking off regular lower body clothing?: A Little 6 Click Score: 21    End of Session Equipment Utilized During Treatment: Gait belt;Rolling walker  OT Visit Diagnosis: Unsteadiness on feet (R26.81);History of falling (Z91.81);Muscle weakness (generalized) (M62.81)   Activity Tolerance Patient  tolerated treatment well   Patient Left in chair;with call bell/phone within reach;with chair alarm set;with family/visitor present   Nurse Communication Mobility status        Time: 8144-8185 OT Time Calculation (min): 34 min  Charges: OT General Charges $OT Visit: 1 Visit OT Treatments $Self Care/Home Management : 23-37 mins  Jinger Neighbors, OTR/L [phone]   Glenford Peers 04/19/2018, 12:50 PM

## 2018-04-19 NOTE — Progress Notes (Addendum)
Discharge instructions reviewed with pt and daughters.  Copy of instructions given to pt, pt scripts sent electronically to pt's pharmacy--pt and daughters made aware. Stroke/TIA education provided. Pt equipment (DME) ordered by CM, delivered here to pt's room and sent with pt at discharge; 3 in 1 and walker.  Pt d/c'd via wheelchair with belongings, with daughters.             Escorted by unit NT.

## 2018-04-19 NOTE — Progress Notes (Signed)
IP rehab admissions - Please see rehab consult done by Dr. Naaman Plummer.  Patient will not need an acute inpatient rehab stay.  Call me for questions.  [phone]

## 2018-04-19 NOTE — Plan of Care (Signed)
  Problem: Ischemic Stroke/TIA Tissue Perfusion: Goal: Knowledge of Plan of Care will increase Outcome: Progressing   Problem: General Medical Path Diagnosis Goal: Knowledge of Plan of Care will increase Outcome: Progressing

## 2018-04-19 NOTE — Care Management Obs Status (Signed)
[place] NOTIFICATION   Patient Details  Name: Terrel Manalo MRN: [id number] Date of Birth: 1922/06/27   Medicare Observation Status Notification Given:  Yes    Pollie Friar, RN 04/19/2018, 11:44 AM

## 2018-04-19 NOTE — Consult Note (Signed)
Physical Medicine and Rehabilitation Consult Reason for Consult: left side weakness and visual deficits Referring Physician: [facility]   HPI: Valerie Barker is a 85 y.o.right handed female with history of hypertension, TIA/CVA 2011, CAD maintained on Plavix, irritable bowel syndrome. Per chart review patient lives alone independent living facility/[facility]. Independent with assistive device using a rolling walker and cane.Patient with recent fall 04/11/2018 striking her head. She was seen in the ED scan negative and discharge back to home. Patient was recently driving until short time ago.  Presented 04/17/2018 with left side weakness and visual deficits. Cranial CT scan negative. Patient did not receive TPA. CT angiogram of head and neck with no emergent large vessel occlusion or stenosis. MRI of the brain negative. Echocardiogram with ejection fraction of 99% grade 2 diastolic dysfunction. Neurology consulted suspect TIA. Maintain on aspirin and Plavix 3 weeks and Plavix alone. Plan for 30 day outpatient monitor. Therapy evaluation completed with recommendations of physical medicine rehabilitation consult.   Review of Systems  Constitutional: Negative for chills and fever.  HENT: Negative for hearing loss.   Eyes: Positive for blurred vision. Negative for double vision.  Respiratory: Negative for cough and shortness of breath.   Cardiovascular: Negative for chest pain, palpitations and leg swelling.  Gastrointestinal: Positive for constipation. Negative for nausea and vomiting.  Genitourinary: Negative for dysuria, flank pain and hematuria.  Musculoskeletal: Positive for joint pain and myalgias.  Skin: Negative for rash.  Neurological: Positive for dizziness and sensory change.  All other systems reviewed and are negative.  Past Medical History:  Diagnosis Date  . Heart disease   . Hypertension   . IBS (irritable bowel syndrome)   . Stroke ([place])    around 2011  . TIA  (transient ischemic attack)    Past Surgical History:  Procedure Laterality Date  . ABDOMINAL HYSTERECTOMY  1978  . BUNIONECTOMY  2006, 1996  . LAPAROSCOPIC SALPINGO OOPHERECTOMY  1988   Family History  Problem Relation Age of Onset  . COPD Mother   . Schizophrenia Brother   . Stroke Father   . Breast cancer Neg Hx    Social History:  reports that she has never smoked. She has never used smokeless tobacco. She reports that she does not drink alcohol or use drugs. Allergies:  Allergies  Allergen Reactions  . Duloxetine Other (See Comments)    unknown  . Erythromycin   . Mirtazapine     Other reaction(s): Other (See Comments) unknown  . Trazodone     Other reaction(s): Other (See Comments) unknown  . Zolpidem     Other reaction(s): Other (See Comments) unknown  . Cyclobenzaprine Anxiety    Unknown  . Diazepam Rash    unknown   Medications Prior to Admission  Medication Sig Dispense Refill  . atorvastatin (LIPITOR) 10 MG tablet TAKE 1/2 TABLET EVERY DAY. (Patient taking differently: Take 5 mg by mouth every evening. ) 45 tablet 4  . Biotin 10000 MCG TABS Take 10,000 mcg by mouth every evening.     . Calcium Carbonate-Vitamin D (CALCIUM-D PO) Take 1 tablet by mouth every evening. 1200/500    . Cholecalciferol (VITAMIN D) 2000 UNITS CAPS Take 2,000 Units by mouth every morning.     . clopidogrel (PLAVIX) 75 MG tablet Take 75 mg by mouth daily.    . Cyanocobalamin (VITAMIN B-12 PO) Take 2,500 mcg by mouth every morning.     . gabapentin (NEURONTIN) 100 MG capsule Take 100-200 capsules  by mouth at bedtime.     . Multiple Vitamin (MULTIVITAMIN) capsule Take 1 capsule by mouth every morning.     Marland Kitchen rOPINIRole (REQUIP) 0.5 MG tablet Take 0.5-1 mg by mouth See admin instructions. Take 1 tablet (.5mg ) by mouth every evening, and 2 tablets (1mg ) every night    . valsartan (DIOVAN) 320 MG tablet Take 320 mg by mouth every morning.       Home: Home Living Family/patient expects to  be discharged to:: Private residence Living Arrangements: Alone Available Help at Discharge: Family, Available PRN/intermittently Type of Home: Independent living facility Home Access: [place]: One level Bathroom Shower/Tub: Chiropodist: Handicapped height [place]: Environmental consultant - 2 wheels, [place] - single point  Functional History: Prior Function Level of Independence: Independent with assistive device(s) Comments: Pt used RW and cane. Per daughter pt was driving up until the last week.  Functional Status:  Mobility: Bed Mobility Overal bed mobility: Needs Assistance Bed Mobility: Sit to Supine, Supine to Sit Supine to sit: Min assist Sit to supine: Min assist, +2 for physical assistance General bed mobility comments: min A with HOB elevated and min cuing for technique Transfers Overall transfer level: Needs assistance Equipment used: Rolling walker (2 wheeled) Transfers: Sit to/from Stand Sit to Stand: Min assist General transfer comment: min A with second person needed for safety and management of equipment Ambulation/Gait Ambulation/Gait assistance: Min assist, +2 safety/equipment Gait Distance (Feet): 20 Feet(reports feeling weak and slightly dizzy.  ) Assistive device: Rolling walker (2 wheeled) Gait Pattern/deviations: Step-through pattern, Trunk flexed, Shuffle General Gait Details: Cues for RW safety, increasing step length and stepping closer to device.  Pt gets distracted and required assistance for obstacle negotiation Gait velocity: Decreased     ADL: ADL Overall ADL's : Needs assistance/impaired Upper Body Bathing: Set up, Sitting Lower Body Bathing: Minimal assistance, Sit to/from stand Upper Body Dressing : Set up, Sitting Lower Body Dressing: Minimal assistance, Sit to/from stand Toilet Transfer: Minimal assistance, RW Toileting- Clothing Manipulation and Hygiene: Minimal assistance, Sit to/from stand Functional mobility  during ADLs: Minimal assistance, Rolling walker  Cognition: Cognition Overall Cognitive Status: Within Functional Limits for tasks assessed Arousal/Alertness: Awake/alert Orientation Level: Oriented X4 Cognition Arousal/Alertness: Awake/alert Behavior During Therapy: WFL for tasks assessed/performed Overall Cognitive Status: Within Functional Limits for tasks assessed General Comments: Pt was very sleepy throughout session. Was slow to respond, but was A&O X4. Was accurate in giving home information as well.   Blood pressure (!) 121/46, pulse (!) 56, temperature 97.7 F (36.5 C), temperature source Oral, resp. rate 18, SpO2 98 %. Physical Exam  Constitutional: She appears well-developed.  HENT:  Head: Normocephalic.  Eyes: Pupils are equal, round, and reactive to light.  Neck: Normal range of motion.  Cardiovascular: Normal rate.  Respiratory: Effort normal.  GI: Soft.  Musculoskeletal:        General: No edema.  Neurological: She displays normal reflexes. No cranial nerve deficit. She exhibits normal muscle tone. Coordination normal.  Patient is alert sitting up in bed. Oriented to person place and time followed full commands. Strength 4+/5 UE bilaterally 4 to 4+/5 LE bilaterally. No sensory findings  Skin: Skin is warm.  Psychiatric: She has a normal mood and affect.    Results for orders placed or performed during the hospital encounter of 04/17/18 (from the past 24 hour(s))  CBC with Differential/Platelet     Status: Abnormal   Collection Time: 04/19/18  6:21 AM  Result Value Ref  Range   WBC 6.6 4.0 - 10.5 K/uL   RBC 3.90 3.87 - 5.11 MIL/uL   Hemoglobin 11.6 (L) 12.0 - 15.0 g/dL   HCT 35.4 (L) 36.0 - 46.0 %   MCV 90.8 80.0 - 100.0 fL   MCH 29.7 26.0 - 34.0 pg   MCHC 32.8 30.0 - 36.0 g/dL   RDW 14.2 11.5 - 15.5 %   Platelets 164 150 - 400 K/uL   nRBC 0.0 0.0 - 0.2 %   Neutrophils Relative % 57 %   Neutro Abs 3.7 1.7 - 7.7 K/uL   Lymphocytes Relative 28 %   Lymphs  Abs 1.8 0.7 - 4.0 K/uL   Monocytes Relative 12 %   Monocytes Absolute 0.8 0.1 - 1.0 K/uL   Eosinophils Relative 2 %   Eosinophils Absolute 0.2 0.0 - 0.5 K/uL   Basophils Relative 0 %   Basophils Absolute 0.0 0.0 - 0.1 K/uL   Immature Granulocytes 1 %   Abs Immature Granulocytes 0.04 0.00 - 0.07 K/uL  Comprehensive metabolic panel     Status: Abnormal   Collection Time: 04/19/18  6:21 AM  Result Value Ref Range   Sodium 138 135 - 145 mmol/L   Potassium 3.8 3.5 - 5.1 mmol/L   Chloride 105 98 - 111 mmol/L   CO2 24 22 - 32 mmol/L   Glucose, Bld 104 (H) 70 - 99 mg/dL   BUN 13 8 - 23 mg/dL   Creatinine, Ser 0.90 0.44 - 1.00 mg/dL   Calcium 8.9 8.9 - 10.3 mg/dL   Total Protein 5.7 (L) 6.5 - 8.1 g/dL   Albumin 3.3 (L) 3.5 - 5.0 g/dL   AST 25 15 - 41 U/L   ALT 20 0 - 44 U/L   Alkaline Phosphatase 42 38 - 126 U/L   Total Bilirubin 0.8 0.3 - 1.2 mg/dL   GFR calc non Af Amer 55 (L) >60 mL/min   GFR calc Af Amer >60 >60 mL/min   Anion gap 9 5 - 15  Magnesium     Status: None   Collection Time: 04/19/18  6:21 AM  Result Value Ref Range   Magnesium 1.9 1.7 - 2.4 mg/dL  Phosphorus     Status: None   Collection Time: 04/19/18  6:21 AM  Result Value Ref Range   Phosphorus 3.0 2.5 - 4.6 mg/dL   Ct Angio Head W Or Wo Contrast  Result Date: 04/17/2018 CLINICAL DATA:  Last seen normal 2300 hours. Left-sided weakness. Right-sided facial droop. EXAM: CT ANGIOGRAPHY HEAD AND NECK TECHNIQUE: Multidetector CT imaging of the head and neck was performed using the standard protocol during bolus administration of intravenous contrast. Multiplanar CT image reconstructions and MIPs were obtained to evaluate the vascular anatomy. Carotid stenosis measurements (when applicable) are obtained utilizing NASCET criteria, using the distal internal carotid diameter as the denominator. CONTRAST:  56mL ISOVUE-370 IOPAMIDOL (ISOVUE-370) INJECTION 76% COMPARISON:  CT earlier same day. FINDINGS: CTA NECK FINDINGS Aortic  arch: Aortic atherosclerosis. No aneurysm or dissection. Branching pattern is normal without origin stenosis. Brachiocephalic vessels are tortuous. Right carotid system: Common carotid artery widely patent to the bifurcation. Minimal atherosclerotic plaque at the carotid bifurcation but no stenosis. Cervical ICA is widely patent, though tortuous. Left carotid system: Common carotid artery widely patent to the bifurcation. Carotid bifurcation shows minimal atherosclerotic plaque. No stenosis or irregularity. Cervical ICA is tortuous but widely patent. Vertebral arteries: Both vertebral artery origins are widely patent. The vertebral arteries are widely patent through  the cervical region to the foramen magnum. Skeleton: Normal Other neck: No mass or lymphadenopathy. Upper chest: Mild pleural and parenchymal scarring at the apices. Review of the MIP images confirms the above findings CTA HEAD FINDINGS Anterior circulation: Both internal carotid arteries are patent through the skull base and siphon regions. There is siphon calcification but no stenosis. The anterior and middle cerebral vessels are patent without proximal stenosis, aneurysm or vascular malformation. No large or medium vessel occlusion. Posterior circulation: Both vertebral arteries are widely patent to the basilar. No basilar stenosis. Posterior circulation branch vessels are patent. Patent posterior communicating arteries on each side. Venous sinuses: Patent and normal. Anatomic variants: None significant. Delayed phase: No abnormal enhancement. Review of the MIP images confirms the above findings IMPRESSION: No large or medium vessel occlusion or correctable proximal stenosis. These results were called by telephone at the time of interpretation on 04/17/2018 at 1:18 pm to Dr. Ray Church , who verbally acknowledged these results. Electronically Signed   By: Nelson Chimes M.D.   On: 04/17/2018 13:19   Ct Angio Neck W Or Wo Contrast  Result Date:  04/17/2018 CLINICAL DATA:  Last seen normal 2300 hours. Left-sided weakness. Right-sided facial droop. EXAM: CT ANGIOGRAPHY HEAD AND NECK TECHNIQUE: Multidetector CT imaging of the head and neck was performed using the standard protocol during bolus administration of intravenous contrast. Multiplanar CT image reconstructions and MIPs were obtained to evaluate the vascular anatomy. Carotid stenosis measurements (when applicable) are obtained utilizing NASCET criteria, using the distal internal carotid diameter as the denominator. CONTRAST:  56mL ISOVUE-370 IOPAMIDOL (ISOVUE-370) INJECTION 76% COMPARISON:  CT earlier same day. FINDINGS: CTA NECK FINDINGS Aortic arch: Aortic atherosclerosis. No aneurysm or dissection. Branching pattern is normal without origin stenosis. Brachiocephalic vessels are tortuous. Right carotid system: Common carotid artery widely patent to the bifurcation. Minimal atherosclerotic plaque at the carotid bifurcation but no stenosis. Cervical ICA is widely patent, though tortuous. Left carotid system: Common carotid artery widely patent to the bifurcation. Carotid bifurcation shows minimal atherosclerotic plaque. No stenosis or irregularity. Cervical ICA is tortuous but widely patent. Vertebral arteries: Both vertebral artery origins are widely patent. The vertebral arteries are widely patent through the cervical region to the foramen magnum. Skeleton: Normal Other neck: No mass or lymphadenopathy. Upper chest: Mild pleural and parenchymal scarring at the apices. Review of the MIP images confirms the above findings CTA HEAD FINDINGS Anterior circulation: Both internal carotid arteries are patent through the skull base and siphon regions. There is siphon calcification but no stenosis. The anterior and middle cerebral vessels are patent without proximal stenosis, aneurysm or vascular malformation. No large or medium vessel occlusion. Posterior circulation: Both vertebral arteries are widely patent  to the basilar. No basilar stenosis. Posterior circulation branch vessels are patent. Patent posterior communicating arteries on each side. Venous sinuses: Patent and normal. Anatomic variants: None significant. Delayed phase: No abnormal enhancement. Review of the MIP images confirms the above findings IMPRESSION: No large or medium vessel occlusion or correctable proximal stenosis. These results were called by telephone at the time of interpretation on 04/17/2018 at 1:18 pm to Dr. Ray Church , who verbally acknowledged these results. Electronically Signed   By: Nelson Chimes M.D.   On: 04/17/2018 13:19   Mr Brain Wo Contrast  Result Date: 04/18/2018 CLINICAL DATA:  Visual disturbance.  Left-sided numbness. EXAM: MRI HEAD WITHOUT CONTRAST TECHNIQUE: Multiplanar, multiecho pulse sequences of the brain and surrounding structures were obtained without intravenous contrast. COMPARISON:  Head  CT 04/17/2018 and MRI 07/05/2017 FINDINGS: Brain: There is no evidence of acute infarct, mass, midline shift, or extra-axial fluid collection. A few scattered chronic cerebral microhemorrhages are noted. Patchy to confluent T2 hyperintensities in the cerebral white matter and pons are unchanged from the prior MRI and nonspecific but compatible with extensive chronic small vessel ischemic disease. Cerebral atrophy is mild for age. Vascular: Major intracranial vascular flow voids are preserved. Skull and upper cervical spine: Unremarkable bone marrow signal. Sinuses/Orbits: Bilateral cataract extraction. Small volume fluid in the left sphenoid sinus. Clear mastoid air cells. Other: None. IMPRESSION: 1. No acute intracranial abnormality. 2. Extensive chronic small vessel ischemic disease. Electronically Signed   By: Logan Bores M.D.   On: 04/18/2018 17:39   Ct Head Code Stroke Wo Contrast  Result Date: 04/17/2018 CLINICAL DATA:  Code stroke. Last seen normal 2300 hours. Left-sided weakness. Right-sided facial droop. EXAM: CT  HEAD WITHOUT CONTRAST TECHNIQUE: Contiguous axial images were obtained from the base of the skull through the vertex without intravenous contrast. COMPARISON:  04/11/2018 FINDINGS: Brain: Advanced chronic small-vessel ischemic changes throughout the brain. No sign of acute infarction, mass lesion, hemorrhage, hydrocephalus or extra-axial collection. Vascular: There is atherosclerotic calcification of the major vessels at the base of the brain. Skull: Negative Sinuses/Orbits: Clear/normal Other: None ASPECTS ([place] Stroke Program Early CT Score) - Ganglionic level infarction (caudate, lentiform nuclei, internal capsule, insula, M1-M3 cortex): 7 - Supraganglionic infarction (M4-M6 cortex): 3 Total score (0-10 with 10 being normal): 10 IMPRESSION: 1. No acute finding by CT. Advanced chronic small-vessel ischemic changes throughout the brain. 2. ASPECTS is 10. * These results were called by telephone at the time of interpretation on 04/17/2018 at 1:02 pm to Dr. Marcelle Overlie , who verbally acknowledged these results. Electronically Signed   By: Nelson Chimes M.D.   On: 04/17/2018 13:03     Assessment/Plan: Diagnosis: TIA presenting with left HP, since resolved. Pt still complaining of dizziness when she first stands up 1. Does the need for close, 24 hr/day medical supervision in concert with the patient's rehab needs make it unreasonable for this patient to be served in a less intensive setting? No 2. Co-Morbidities requiring supervision/potential complications:   3. Due to disease management, medication administration, pain management and patient education, does the patient require 24 hr/day rehab nursing? No 4. Does the patient require coordinated care of a physician, rehab nurse, PT, OT to address physical and functional deficits in the context of the above medical diagnosis(es)? No Addressing deficits in the following areas: na 5. Can the patient actively participate in an intensive therapy program of at least 3  hrs of therapy per day at least 5 days per week? Yes 6. The potential for patient to make measurable gains while on inpatient rehab is n/a 7. Anticipated functional outcomes upon discharge from inpatient rehab are n/a  with PT, n/a with OT, n/a with SLP. 8. Estimated rehab length of stay to reach the above functional goals is: n/a 9. Anticipated D/C setting: Home 10. Anticipated post D/C treatments: N/A 11. Overall Rehab/Functional Prognosis: excellent  RECOMMENDATIONS: This patient's condition is appropriate for continued rehabilitative care in the following setting: [facility] Therapy Patient has agreed to participate in recommended program. Yes Note that insurance prior authorization may be required for reimbursement for recommended care.  Comment: Pt is back at neurological baseline. ?perhaps she's having slight orthostasis when standing. Recommended adequate fluids, acclimation prior to standing, and perhaps TEDs. Attending MD to follow up with patient about  this as well.   I have personally performed a face to face diagnostic evaluation of this patient and formulated the key components of the plan.  Additionally, I have personally reviewed laboratory data, imaging studies, as well as relevant notes and concur with the physician assistant's documentation above.  Meredith Staggers, MD, FAAPMR    Lavon Paganini [place], PA-C 04/19/2018

## 2018-04-19 NOTE — Discharge Summary (Signed)
Physician Discharge Summary  Valerie Barker IPJ:[id number] DOB: [birth year] DOA: 04/17/2018  PCP: Deland Pretty, MD  Admit date: 04/17/2018 Discharge date: 04/19/2018  Admitted From: Home Disposition: Home with Resumption of Outpatient PT/OT Services   Recommendations for Outpatient Follow-up:  1. Follow up with PCP in 1-2 weeks 2. Follow up with Neurology in 4 weeks 3. Please obtain CMP/CBC, Mag, Phos in one week 4. Please follow up on the following pending results:  Home Health: No Equipment/Devices: None    Discharge Condition: Stable CODE STATUS: FULL CODE Diet recommendation: Heart Health Diet   Brief/Interim Summary: HPI per Dr. Randa Spike on 04/17/2018 Valerie Barker a 85 y.o.femalewith medical history significant ofhypertension, TIA, stroke, coronary artery disease, irritable bowel syndrome who presents emergency department not being able to see her clock properly upon waking up this morning along with trouble understanding and left-sided numbness. Code stroke was called and she was seen by Dr. Marcelle Overlie in the emergency department and head CT was obtained was negative for acute intracranial pathology. She is not a candidate for IV TPA because she presented outside the window. CT angiogram was obtained and was negative for large vessel occlusion.Patient is currently awake and complaining of abdominal discomfort. She feels like she cannot urinate. [place] is in place ED Course:Not a candidate for TPA, seen urgently by neurology. CTA of the head unrevealing with no large vessel occlusion, CT scan of the head shows no acute stroke, much calcifications in the head and therefore neurology recommended adding aspirin to Plavix.  * Unable to have MRI complete yesterday so was done with sedation and did not show CVA. Neurology Stroke Team following and felt as if she had a TIA. They recommended ASA and Plavix for 3 weeks and then stopping ASA and continuing Plavix.  Dr. Erlinda Hong also recommended continuing Lipitor 5 mg po daily and setting the patient up for a 30 day Event Monitor. She was not a candidate for CIR because she was too high functioning and so she will go Home with Resumption of outpatient PT services.   Discharge Diagnoses:  Principal Problem:   Stroke (cerebrum) ([place]) Active Problems:   Dyslipidemia   Short-term memory loss   Essential hypertension   Acute urinary retention  TIA -Patient with history of stroke multiple calcifications on CTA of the head.  -Seen by Neurology who recommended MRI.  -She has already had a CTA therefore does not need vascular Dopplers nor does she require MRA of the brain.  -We will obtain a simple MRI and it showed acute infarct -PT/OT/SLP for evaluations and recommended CIR but she was too high functioning  -C/w Neurochecks per Protocol -ECHOCardiogram done and read is below -Lipid Panel done and showed a 128, HDL 67, LDL 48, triglycerides of 67, and VLDL of 13 -HbA1c was 5.6 -Neurology evaluated and recommending Clopidogrel 75 mg Daily prior to Admission and recommending DAPT with ASA and Plavix x3 weeks and then stopping ASA and c/w Plavix alone -PT/OT recommending CIR vs. SNF but not a candidate for CIR so will go home with [place] -Further recommendations per Neurology and they recommend continuing Atorvastatin 5 mg po Daily and having the patient have a 30 Day Event Monitor with Dr. Colan Neptune to r/o A Fib.   Acute Urinary Retention, improved  -Patient unable to urinate.  -We bladder scanned her and she had significant urine output.  -Currently approximately 1300 mL's documented  -Not sure if all that is coming from the in and  out cath procedure.  -Will bladder scan every 6 hours if she has urinary retention of greater than 200 mL for 3 attempts will insert Foley catheter; No need for Foley  -U/A Unremarkable  -Check Renal U/S but never done and able to Urinate   Essential  Hypertension -We will hold blood pressure medicines of Valsartan for now and allow for permissive hypertension.  -Blood pressure at 125/61 currently.  -We will continue to monitor.  Dyslipidemia  -Atorvastatin 5 mg po Daily resumed by Neurology  Short-Term Memory loss  -continue home management regimen with Vitamin B12 2,500 mcg po Daily   Hyponatremia -Patient's Na+ was 134 and improved to 138 -Mild. Continue to Monitor and repeat CMP in AM -If Necessary may start IVF Hydration  Hx of CVA/TIA -Neurology following and recommendations as above  RLS -C/w Ropinirole 0.5 mg po Daily and 1 mg po qHS  Normocytic Anemia  -Patient's Hb/Hct went from 12.1/36.7 -> 11.6/35.4 -Check Anemia Panel as an outpatient  -Continue to Monitor for S/Sx of Bleeding -Repeat CBC as an outpatient   Discharge Instructions  Discharge Instructions    Ambulatory referral to Neurology   Complete by:  As directed    An appointment is requested in approximately: 4 weeks.  Patient is Dr. Leta Baptist patient in the past.   Call MD for:  difficulty breathing, headache or visual disturbances   Complete by:  As directed    Call MD for:  extreme fatigue   Complete by:  As directed    Call MD for:  hives   Complete by:  As directed    Call MD for:  persistant dizziness or light-headedness   Complete by:  As directed    Call MD for:  persistant nausea and vomiting   Complete by:  As directed    Call MD for:  redness, tenderness, or signs of infection (pain, swelling, redness, odor or green/yellow discharge around incision site)   Complete by:  As directed    Call MD for:  severe uncontrolled pain   Complete by:  As directed    Call MD for:  temperature >100.4   Complete by:  As directed    Diet - low sodium heart healthy   Complete by:  As directed    Discharge instructions   Complete by:  As directed    You were cared for by a hospitalist during your hospital stay. If you have any questions about  your discharge medications or the care you received while you were in the hospital after you are discharged, you can call the unit and ask to speak with the hospitalist on call if the hospitalist that took care of you is not available. Once you are discharged, your primary care physician will handle any further medical issues. Please note that NO REFILLS for any discharge medications will be authorized once you are discharged, as it is imperative that you return to your primary care physician (or establish a relationship with a primary care physician if you do not have one) for your aftercare needs so that they can reassess your need for medications and monitor your lab values.  Follow up with PCP, Neurology, and Cardiology for an outpatient 30 Day Monitor. Take all medications as prescribed. If symptoms change or worsen please return to the ED for evaluation   Increase activity slowly   Complete by:  As directed      Allergies as of 04/19/2018      Reactions  Duloxetine Other (See Comments)   unknown   Erythromycin    Mirtazapine    Other reaction(s): Other (See Comments) unknown   Trazodone    Other reaction(s): Other (See Comments) unknown   Zolpidem    Other reaction(s): Other (See Comments) unknown   Cyclobenzaprine Anxiety   Unknown   Diazepam Rash   unknown      Medication List    TAKE these medications   aspirin 81 MG EC tablet Take 1 tablet (81 mg total) by mouth daily. Start taking on:  April 20, 2018   atorvastatin 10 MG tablet Commonly known as:  LIPITOR TAKE 1/2 TABLET EVERY DAY. What changed:  when to take this   Biotin 10000 MCG Tabs Take 10,000 mcg by mouth every evening.   CALCIUM-D PO Take 1 tablet by mouth every evening. 1200/500   clopidogrel 75 MG tablet Commonly known as:  PLAVIX Take 75 mg by mouth daily.   gabapentin 100 MG capsule Commonly known as:  NEURONTIN Take 1 capsule (100 mg total) by mouth at bedtime. What changed:  how much to  take   multivitamin capsule Take 1 capsule by mouth every morning.   rOPINIRole 0.5 MG tablet Commonly known as:  REQUIP Take 0.5-1 mg by mouth See admin instructions. Take 1 tablet (.5mg ) by mouth every evening, and 2 tablets (1mg ) every night   senna-docusate 8.6-50 MG tablet Commonly known as:  Senokot-S Take 1 tablet by mouth at bedtime as needed for mild constipation.   valsartan 320 MG tablet Commonly known as:  DIOVAN Take 320 mg by mouth every morning.   VITAMIN B-12 PO Take 2,500 mcg by mouth every morning.   Vitamin D 50 MCG (2000 UT) Caps Take 2,000 Units by mouth every morning.      Follow-up Information    Penumalli, Earlean Polka, MD Follow up in 4 week(s).   Specialties:  Neurology, Radiology Why:  4 weeks after discharge f/u for stroke like symptoms. should have 30 day cardiac monitor in place or completed.  Contact information: [address] [place] [address] [phone]          Allergies  Allergen Reactions  . Duloxetine Other (See Comments)    unknown  . Erythromycin   . Mirtazapine     Other reaction(s): Other (See Comments) unknown  . Trazodone     Other reaction(s): Other (See Comments) unknown  . Zolpidem     Other reaction(s): Other (See Comments) unknown  . Cyclobenzaprine Anxiety    Unknown  . Diazepam Rash    unknown   Consultations:  Neurology  Procedures/Studies: Ct Angio Head W Or Wo Contrast  Result Date: 04/17/2018 CLINICAL DATA:  Last seen normal 2300 hours. Left-sided weakness. Right-sided facial droop. EXAM: CT ANGIOGRAPHY HEAD AND NECK TECHNIQUE: Multidetector CT imaging of the head and neck was performed using the standard protocol during bolus administration of intravenous contrast. Multiplanar CT image reconstructions and MIPs were obtained to evaluate the vascular anatomy. Carotid stenosis measurements (when applicable) are obtained utilizing NASCET criteria, using the distal internal carotid  diameter as the denominator. CONTRAST:  64mL ISOVUE-370 IOPAMIDOL (ISOVUE-370) INJECTION 76% COMPARISON:  CT earlier same day. FINDINGS: CTA NECK FINDINGS Aortic arch: Aortic atherosclerosis. No aneurysm or dissection. Branching pattern is normal without origin stenosis. Brachiocephalic vessels are tortuous. Right carotid system: Common carotid artery widely patent to the bifurcation. Minimal atherosclerotic plaque at the carotid bifurcation but no stenosis. Cervical ICA is widely patent, though tortuous.  Left carotid system: Common carotid artery widely patent to the bifurcation. Carotid bifurcation shows minimal atherosclerotic plaque. No stenosis or irregularity. Cervical ICA is tortuous but widely patent. Vertebral arteries: Both vertebral artery origins are widely patent. The vertebral arteries are widely patent through the cervical region to the foramen magnum. Skeleton: Normal Other neck: No mass or lymphadenopathy. Upper chest: Mild pleural and parenchymal scarring at the apices. Review of the MIP images confirms the above findings CTA HEAD FINDINGS Anterior circulation: Both internal carotid arteries are patent through the skull base and siphon regions. There is siphon calcification but no stenosis. The anterior and middle cerebral vessels are patent without proximal stenosis, aneurysm or vascular malformation. No large or medium vessel occlusion. Posterior circulation: Both vertebral arteries are widely patent to the basilar. No basilar stenosis. Posterior circulation branch vessels are patent. Patent posterior communicating arteries on each side. Venous sinuses: Patent and normal. Anatomic variants: None significant. Delayed phase: No abnormal enhancement. Review of the MIP images confirms the above findings IMPRESSION: No large or medium vessel occlusion or correctable proximal stenosis. These results were called by telephone at the time of interpretation on 04/17/2018 at 1:18 pm to Dr. Ray Church , who  verbally acknowledged these results. Electronically Signed   By: Nelson Chimes M.D.   On: 04/17/2018 13:19   Ct Head Wo Contrast  Result Date: 04/11/2018 CLINICAL DATA:  85 year old female with history of trauma from a fall backwards when leaving the doctor's office. Injury to the back of the head. No loss of consciousness. EXAM: CT HEAD WITHOUT CONTRAST CT CERVICAL SPINE WITHOUT CONTRAST TECHNIQUE: Multidetector CT imaging of the head and cervical spine was performed following the standard protocol without intravenous contrast. Multiplanar CT image reconstructions of the cervical spine were also generated. COMPARISON:  Head CT and cervical spine CT 08/01/2017. FINDINGS: CT HEAD FINDINGS Brain: Patchy and confluent areas of decreased attenuation are noted throughout the deep and periventricular white matter of the cerebral hemispheres bilaterally, compatible with chronic microvascular ischemic disease. Physiologic calcifications in the right basal ganglia. No evidence of acute infarction, hemorrhage, hydrocephalus, extra-axial collection or mass lesion/mass effect. Vascular: No hyperdense vessel or unexpected calcification. Skull: Normal. Negative for fracture or focal lesion. Sinuses/Orbits: No acute finding. Other: None. CT CERVICAL SPINE FINDINGS Alignment: Normal. Skull base and vertebrae: No acute fracture. No primary bone lesion or focal pathologic process. Soft tissues and spinal canal: No prevertebral fluid or swelling. No visible canal hematoma. Disc levels: Mild multilevel degenerative disc disease, most severe at C4-C5. Mild multilevel facet arthropathy. Upper chest: Mild scarring in the lung apices. Other: None. IMPRESSION: 1. No evidence of significant acute traumatic injury to the skull, brain or cervical spine. 2. Extensive chronic microvascular ischemic changes in the cerebral white matter redemonstrated, as above. 3. Very mild multilevel degenerative disc disease and cervical spondylosis.  Electronically Signed   By: Vinnie Langton M.D.   On: 04/11/2018 15:24   Ct Angio Neck W Or Wo Contrast  Result Date: 04/17/2018 CLINICAL DATA:  Last seen normal 2300 hours. Left-sided weakness. Right-sided facial droop. EXAM: CT ANGIOGRAPHY HEAD AND NECK TECHNIQUE: Multidetector CT imaging of the head and neck was performed using the standard protocol during bolus administration of intravenous contrast. Multiplanar CT image reconstructions and MIPs were obtained to evaluate the vascular anatomy. Carotid stenosis measurements (when applicable) are obtained utilizing NASCET criteria, using the distal internal carotid diameter as the denominator. CONTRAST:  75mL ISOVUE-370 IOPAMIDOL (ISOVUE-370) INJECTION 76% COMPARISON:  CT earlier same  day. FINDINGS: CTA NECK FINDINGS Aortic arch: Aortic atherosclerosis. No aneurysm or dissection. Branching pattern is normal without origin stenosis. Brachiocephalic vessels are tortuous. Right carotid system: Common carotid artery widely patent to the bifurcation. Minimal atherosclerotic plaque at the carotid bifurcation but no stenosis. Cervical ICA is widely patent, though tortuous. Left carotid system: Common carotid artery widely patent to the bifurcation. Carotid bifurcation shows minimal atherosclerotic plaque. No stenosis or irregularity. Cervical ICA is tortuous but widely patent. Vertebral arteries: Both vertebral artery origins are widely patent. The vertebral arteries are widely patent through the cervical region to the foramen magnum. Skeleton: Normal Other neck: No mass or lymphadenopathy. Upper chest: Mild pleural and parenchymal scarring at the apices. Review of the MIP images confirms the above findings CTA HEAD FINDINGS Anterior circulation: Both internal carotid arteries are patent through the skull base and siphon regions. There is siphon calcification but no stenosis. The anterior and middle cerebral vessels are patent without proximal stenosis, aneurysm or  vascular malformation. No large or medium vessel occlusion. Posterior circulation: Both vertebral arteries are widely patent to the basilar. No basilar stenosis. Posterior circulation branch vessels are patent. Patent posterior communicating arteries on each side. Venous sinuses: Patent and normal. Anatomic variants: None significant. Delayed phase: No abnormal enhancement. Review of the MIP images confirms the above findings IMPRESSION: No large or medium vessel occlusion or correctable proximal stenosis. These results were called by telephone at the time of interpretation on 04/17/2018 at 1:18 pm to Dr. Ray Church , who verbally acknowledged these results. Electronically Signed   By: Nelson Chimes M.D.   On: 04/17/2018 13:19   Ct Cervical Spine Wo Contrast  Result Date: 04/11/2018 CLINICAL DATA:  85 year old female with history of trauma from a fall backwards when leaving the doctor's office. Injury to the back of the head. No loss of consciousness. EXAM: CT HEAD WITHOUT CONTRAST CT CERVICAL SPINE WITHOUT CONTRAST TECHNIQUE: Multidetector CT imaging of the head and cervical spine was performed following the standard protocol without intravenous contrast. Multiplanar CT image reconstructions of the cervical spine were also generated. COMPARISON:  Head CT and cervical spine CT 08/01/2017. FINDINGS: CT HEAD FINDINGS Brain: Patchy and confluent areas of decreased attenuation are noted throughout the deep and periventricular white matter of the cerebral hemispheres bilaterally, compatible with chronic microvascular ischemic disease. Physiologic calcifications in the right basal ganglia. No evidence of acute infarction, hemorrhage, hydrocephalus, extra-axial collection or mass lesion/mass effect. Vascular: No hyperdense vessel or unexpected calcification. Skull: Normal. Negative for fracture or focal lesion. Sinuses/Orbits: No acute finding. Other: None. CT CERVICAL SPINE FINDINGS Alignment: Normal. Skull base and  vertebrae: No acute fracture. No primary bone lesion or focal pathologic process. Soft tissues and spinal canal: No prevertebral fluid or swelling. No visible canal hematoma. Disc levels: Mild multilevel degenerative disc disease, most severe at C4-C5. Mild multilevel facet arthropathy. Upper chest: Mild scarring in the lung apices. Other: None. IMPRESSION: 1. No evidence of significant acute traumatic injury to the skull, brain or cervical spine. 2. Extensive chronic microvascular ischemic changes in the cerebral white matter redemonstrated, as above. 3. Very mild multilevel degenerative disc disease and cervical spondylosis. Electronically Signed   By: Vinnie Langton M.D.   On: 04/11/2018 15:24   Mr Brain Wo Contrast  Result Date: 04/18/2018 CLINICAL DATA:  Visual disturbance.  Left-sided numbness. EXAM: MRI HEAD WITHOUT CONTRAST TECHNIQUE: Multiplanar, multiecho pulse sequences of the brain and surrounding structures were obtained without intravenous contrast. COMPARISON:  Head CT 04/17/2018 and  MRI 07/05/2017 FINDINGS: Brain: There is no evidence of acute infarct, mass, midline shift, or extra-axial fluid collection. A few scattered chronic cerebral microhemorrhages are noted. Patchy to confluent T2 hyperintensities in the cerebral white matter and pons are unchanged from the prior MRI and nonspecific but compatible with extensive chronic small vessel ischemic disease. Cerebral atrophy is mild for age. Vascular: Major intracranial vascular flow voids are preserved. Skull and upper cervical spine: Unremarkable bone marrow signal. Sinuses/Orbits: Bilateral cataract extraction. Small volume fluid in the left sphenoid sinus. Clear mastoid air cells. Other: None. IMPRESSION: 1. No acute intracranial abnormality. 2. Extensive chronic small vessel ischemic disease. Electronically Signed   By: Logan Bores M.D.   On: 04/18/2018 17:39   Mm 3d Screen Breast Bilateral  Result Date: 04/11/2018 CLINICAL DATA:   Screening. EXAM: DIGITAL SCREENING BILATERAL MAMMOGRAM WITH TOMO AND CAD COMPARISON:  Previous exam(s). ACR Breast Density Category b: There are scattered areas of fibroglandular density. FINDINGS: There are no findings suspicious for malignancy. Images were processed with CAD. IMPRESSION: No mammographic evidence of malignancy. A result letter of this screening mammogram will be mailed directly to the patient. RECOMMENDATION: Screening mammogram in one year. (Code:SM-B-01Y) BI-RADS CATEGORY  1: Negative. Electronically Signed   By: Lajean Manes M.D.   On: 04/11/2018 13:22   Ct Head Code Stroke Wo Contrast  Result Date: 04/17/2018 CLINICAL DATA:  Code stroke. Last seen normal 2300 hours. Left-sided weakness. Right-sided facial droop. EXAM: CT HEAD WITHOUT CONTRAST TECHNIQUE: Contiguous axial images were obtained from the base of the skull through the vertex without intravenous contrast. COMPARISON:  04/11/2018 FINDINGS: Brain: Advanced chronic small-vessel ischemic changes throughout the brain. No sign of acute infarction, mass lesion, hemorrhage, hydrocephalus or extra-axial collection. Vascular: There is atherosclerotic calcification of the major vessels at the base of the brain. Skull: Negative Sinuses/Orbits: Clear/normal Other: None ASPECTS ([place] Stroke Program Early CT Score) - Ganglionic level infarction (caudate, lentiform nuclei, internal capsule, insula, M1-M3 cortex): 7 - Supraganglionic infarction (M4-M6 cortex): 3 Total score (0-10 with 10 being normal): 10 IMPRESSION: 1. No acute finding by CT. Advanced chronic small-vessel ischemic changes throughout the brain. 2. ASPECTS is 10. * These results were called by telephone at the time of interpretation on 04/17/2018 at 1:02 pm to Dr. Marcelle Overlie , who verbally acknowledged these results. Electronically Signed   By: Nelson Chimes M.D.   On: 04/17/2018 13:03   ECHOCARDIOGRAM ------------------------------------------------------------------- Study  Conclusions  - Left ventricle: The cavity size was normal. Wall thickness was   normal. Systolic function was normal. The estimated ejection   fraction was in the range of 60% to 65%. Wall motion was normal;   there were no regional wall motion abnormalities. Features are   consistent with a pseudonormal left ventricular filling pattern,   with concomitant abnormal relaxation and increased filling   pressure (grade 2 diastolic dysfunction).  Subjective: Seen and examined at bedside and was doing well.  Denies any chest pain, lightheadedness or dizziness.  Felt well and wanted to go home.  No other concerns or complaints and she will follow with PCP and neurology in outpatient setting  Discharge Exam: Vitals:   04/19/18 0744 04/19/18 1142  BP: (!) 121/46 125/61  Pulse: (!) 56 61  Resp: 18 18  Temp: 97.7 F (36.5 C) 98.2 F (36.8 C)  SpO2: 98% 96%   Vitals:   04/18/18 2337 04/19/18 0428 04/19/18 0744 04/19/18 1142  BP: (!) 132/56 (!) 119/43 (!) 121/46 125/61  Pulse: Marland Kitchen)  55 63 (!) 56 61  Resp: 20 (!) 0 18 18  Temp: 98.1 F (36.7 C) 98.2 F (36.8 C) 97.7 F (36.5 C) 98.2 F (36.8 C)  TempSrc: Oral Oral Oral Oral  SpO2: 97% 95% 98% 96%   General: Pt is alert, awake, not in acute distress Cardiovascular: RRR, S1/S2 +, no rubs, no gallops Respiratory: Diminished bilaterally, no wheezing, no rhonchi Abdominal: Soft, NT, ND, bowel sounds + Extremities: no edema, no cyanosis  The results of significant diagnostics from this hospitalization (including imaging, microbiology, ancillary and laboratory) are listed below for reference.    Microbiology: Recent Results (from the past 240 hour(s))  Culture, blood (routine x 2)     Status: None (Preliminary result)   Collection Time: 04/17/18  6:40 PM  Result Value Ref Range Status   Specimen Description BLOOD LEFT ARM  Final   Special Requests   Final    BOTTLES DRAWN AEROBIC ONLY Blood Culture results may not be optimal due to an  inadequate volume of blood received in culture bottles   Culture   Final    NO GROWTH 2 DAYS Performed at [facility] [address] [place], [place] [address]    Report Status PENDING  Incomplete  Culture, blood (routine x 2)     Status: None (Preliminary result)   Collection Time: 04/17/18  6:50 PM  Result Value Ref Range Status   Specimen Description BLOOD RIGHT HAND  Final   Special Requests   Final    BOTTLES DRAWN AEROBIC ONLY Blood Culture adequate volume   Culture   Final    NO GROWTH 2 DAYS Performed at [facility], [place] [address] [place],  [address]    Report Status PENDING  Incomplete    Labs: BNP (last 3 results) No results for input(s): BNP in the last 8760 hours. Basic Metabolic Panel: Recent Labs  Lab 04/17/18 1246 04/17/18 1251 04/18/18 0847 04/19/18 0621  NA 139 138 134* 138  K 4.2 4.1 3.9 3.8  CL 105 104 102 105  CO2 25  --  23 24  GLUCOSE 105* 101* 152* 104*  BUN 14 16 15 13   CREATININE 0.89 0.80 0.92 0.90  CALCIUM 9.3  --  8.7* 8.9  MG  --   --  1.8 1.9  PHOS  --   --  3.3 3.0   Liver Function Tests: Recent Labs  Lab 04/17/18 1246 04/18/18 0847 04/19/18 0621  AST 23 25 25   ALT 21 21 20   ALKPHOS 51 47 42  BILITOT 0.8 0.8 0.8  PROT 6.1* 5.9* 5.7*  ALBUMIN 3.9 3.6 3.3*   No results for input(s): LIPASE, AMYLASE in the last 168 hours. No results for input(s): AMMONIA in the last 168 hours. CBC: Recent Labs  Lab 04/17/18 1246 04/17/18 1251 04/18/18 0847 04/19/18 0621  WBC 6.6  --  8.2 6.6  NEUTROABS 4.4  --  5.7 3.7  HGB 12.5 12.9 12.1 11.6*  HCT 39.4 38.0 36.7 35.4*  MCV 94.7  --  91.3 90.8  PLT 163  --  184 164   Cardiac Enzymes: No results for input(s): CKTOTAL, CKMB, CKMBINDEX, TROPONINI in the last 168 hours. BNP: Invalid input(s): POCBNP CBG: No results for input(s): GLUCAP in the last 168 hours. D-Dimer No results for input(s): DDIMER in the last 72 hours. Hgb A1c Recent Labs    04/18/18 0458   HGBA1C 5.6   Lipid Profile Recent Labs    04/18/18  0458  CHOL 128  HDL 67  LDLCALC 48  TRIG 67  CHOLHDL 1.9   Thyroid function studies No results for input(s): TSH, T4TOTAL, T3FREE, THYROIDAB in the last 72 hours.  Invalid input(s): FREET3 Anemia work up No results for input(s): VITAMINB12, FOLATE, FERRITIN, TIBC, IRON, RETICCTPCT in the last 72 hours. Urinalysis    Component Value Date/Time   COLORURINE YELLOW 04/17/2018 1340   APPEARANCEUR HAZY (A) 04/17/2018 1340   LABSPEC 1.009 04/17/2018 1340   PHURINE 7.0 04/17/2018 1340   GLUCOSEU NEGATIVE 04/17/2018 1340   HGBUR SMALL (A) 04/17/2018 1340   BILIRUBINUR NEGATIVE 04/17/2018 1340   KETONESUR NEGATIVE 04/17/2018 1340   PROTEINUR NEGATIVE 04/17/2018 1340   UROBILINOGEN 0.2 04/27/2009 1643   NITRITE NEGATIVE 04/17/2018 1340   LEUKOCYTESUR TRACE (A) 04/17/2018 1340   Sepsis Labs Invalid input(s): PROCALCITONIN,  WBC,  LACTICIDVEN Microbiology Recent Results (from the past 240 hour(s))  Culture, blood (routine x 2)     Status: None (Preliminary result)   Collection Time: 04/17/18  6:40 PM  Result Value Ref Range Status   Specimen Description BLOOD LEFT ARM  Final   Special Requests   Final    BOTTLES DRAWN AEROBIC ONLY Blood Culture results may not be optimal due to an inadequate volume of blood received in culture bottles   Culture   Final    NO GROWTH 2 DAYS Performed at [facility], [place] [address], [place], [place] [address]    Report Status PENDING  Incomplete  Culture, blood (routine x 2)     Status: None (Preliminary result)   Collection Time: 04/17/18  6:50 PM  Result Value Ref Range Status   Specimen Description BLOOD RIGHT HAND  Final   Special Requests   Final    BOTTLES DRAWN AEROBIC ONLY Blood Culture adequate volume   Culture   Final    NO GROWTH 2 DAYS Performed at [facility], [place] [address], [place], [place] [address]    Report Status PENDING  Incomplete   Time coordinating  discharge: 35 minutes  SIGNED:  Kerney Elbe, DO [facility] 04/19/2018, 12:46 PM Pager is on [place]  If 7PM-7AM, please contact night-coverage [email or web address] Password [email or web address]

## 2018-04-20 ENCOUNTER — Telehealth: Payer: Self-pay | Admitting: Diagnostic Neuroimaging

## 2018-04-20 DIAGNOSIS — M6281 Muscle weakness (generalized): Secondary | ICD-10-CM | POA: Diagnosis not present

## 2018-04-20 DIAGNOSIS — R262 Difficulty in walking, not elsewhere classified: Secondary | ICD-10-CM | POA: Diagnosis not present

## 2018-04-20 DIAGNOSIS — R278 Other lack of coordination: Secondary | ICD-10-CM | POA: Diagnosis not present

## 2018-04-20 NOTE — Telephone Encounter (Signed)
Message created in error

## 2018-04-20 NOTE — Telephone Encounter (Signed)
Pt's daughter called in,

## 2018-04-22 LAB — CULTURE, BLOOD (ROUTINE X 2)
Culture: NO GROWTH
Culture: NO GROWTH
Special Requests: ADEQUATE

## 2018-04-23 DIAGNOSIS — R262 Difficulty in walking, not elsewhere classified: Secondary | ICD-10-CM | POA: Diagnosis not present

## 2018-04-23 DIAGNOSIS — R278 Other lack of coordination: Secondary | ICD-10-CM | POA: Diagnosis not present

## 2018-04-23 DIAGNOSIS — M6281 Muscle weakness (generalized): Secondary | ICD-10-CM | POA: Diagnosis not present

## 2018-04-24 DIAGNOSIS — M6389 Disorders of muscle in diseases classified elsewhere, multiple sites: Secondary | ICD-10-CM | POA: Diagnosis not present

## 2018-04-24 DIAGNOSIS — R262 Difficulty in walking, not elsewhere classified: Secondary | ICD-10-CM | POA: Diagnosis not present

## 2018-04-24 DIAGNOSIS — I6389 Other cerebral infarction: Secondary | ICD-10-CM | POA: Diagnosis not present

## 2018-04-24 DIAGNOSIS — R278 Other lack of coordination: Secondary | ICD-10-CM | POA: Diagnosis not present

## 2018-04-25 DIAGNOSIS — I6389 Other cerebral infarction: Secondary | ICD-10-CM | POA: Diagnosis not present

## 2018-04-25 DIAGNOSIS — M6389 Disorders of muscle in diseases classified elsewhere, multiple sites: Secondary | ICD-10-CM | POA: Diagnosis not present

## 2018-04-25 DIAGNOSIS — R262 Difficulty in walking, not elsewhere classified: Secondary | ICD-10-CM | POA: Diagnosis not present

## 2018-04-25 DIAGNOSIS — R278 Other lack of coordination: Secondary | ICD-10-CM | POA: Diagnosis not present

## 2018-04-26 DIAGNOSIS — M6281 Muscle weakness (generalized): Secondary | ICD-10-CM | POA: Diagnosis not present

## 2018-04-26 DIAGNOSIS — R278 Other lack of coordination: Secondary | ICD-10-CM | POA: Diagnosis not present

## 2018-04-26 DIAGNOSIS — R262 Difficulty in walking, not elsewhere classified: Secondary | ICD-10-CM | POA: Diagnosis not present

## 2018-04-27 DIAGNOSIS — R278 Other lack of coordination: Secondary | ICD-10-CM | POA: Diagnosis not present

## 2018-04-27 DIAGNOSIS — M6281 Muscle weakness (generalized): Secondary | ICD-10-CM | POA: Diagnosis not present

## 2018-04-27 DIAGNOSIS — R262 Difficulty in walking, not elsewhere classified: Secondary | ICD-10-CM | POA: Diagnosis not present

## 2018-05-01 ENCOUNTER — Other Ambulatory Visit: Payer: Self-pay

## 2018-05-01 DIAGNOSIS — I6389 Other cerebral infarction: Secondary | ICD-10-CM | POA: Diagnosis not present

## 2018-05-01 DIAGNOSIS — R262 Difficulty in walking, not elsewhere classified: Secondary | ICD-10-CM | POA: Diagnosis not present

## 2018-05-01 DIAGNOSIS — M6389 Disorders of muscle in diseases classified elsewhere, multiple sites: Secondary | ICD-10-CM | POA: Diagnosis not present

## 2018-05-01 DIAGNOSIS — R278 Other lack of coordination: Secondary | ICD-10-CM | POA: Diagnosis not present

## 2018-05-01 NOTE — Patient Outreach (Signed)
Valerie Barker [facility]) Care Management  05/01/2018  Valerie Barker [facility] Dec 21, 1922 [id number]   Medication Adherence call to Valerie Barker patient did not answer patient is due on Atorvastatin 10 mg and Valsartan 320 mg. Valerie Barker said patient last pick up was on 04/13/18 for Atorvastatin for a 90 days supply and Valsartan was pick up on 04/23/18 for a 30 days supply. Valerie Barker is showing past due under [place].   [facility] [place] Management Direct Dial [phone]  Fax [phone] [email or web address] [name]

## 2018-05-02 DIAGNOSIS — R278 Other lack of coordination: Secondary | ICD-10-CM | POA: Diagnosis not present

## 2018-05-02 DIAGNOSIS — I6389 Other cerebral infarction: Secondary | ICD-10-CM | POA: Diagnosis not present

## 2018-05-02 DIAGNOSIS — M6389 Disorders of muscle in diseases classified elsewhere, multiple sites: Secondary | ICD-10-CM | POA: Diagnosis not present

## 2018-05-02 DIAGNOSIS — R262 Difficulty in walking, not elsewhere classified: Secondary | ICD-10-CM | POA: Diagnosis not present

## 2018-05-03 ENCOUNTER — Ambulatory Visit (INDEPENDENT_AMBULATORY_CARE_PROVIDER_SITE_OTHER): Payer: Medicare Other

## 2018-05-03 ENCOUNTER — Other Ambulatory Visit: Payer: Self-pay | Admitting: Physician Assistant

## 2018-05-03 DIAGNOSIS — G459 Transient cerebral ischemic attack, unspecified: Secondary | ICD-10-CM

## 2018-05-03 DIAGNOSIS — I4891 Unspecified atrial fibrillation: Secondary | ICD-10-CM

## 2018-05-03 DIAGNOSIS — I639 Cerebral infarction, unspecified: Secondary | ICD-10-CM

## 2018-05-04 DIAGNOSIS — M6281 Muscle weakness (generalized): Secondary | ICD-10-CM | POA: Diagnosis not present

## 2018-05-04 DIAGNOSIS — R262 Difficulty in walking, not elsewhere classified: Secondary | ICD-10-CM | POA: Diagnosis not present

## 2018-05-04 DIAGNOSIS — R278 Other lack of coordination: Secondary | ICD-10-CM | POA: Diagnosis not present

## 2018-05-07 DIAGNOSIS — M6281 Muscle weakness (generalized): Secondary | ICD-10-CM | POA: Diagnosis not present

## 2018-05-07 DIAGNOSIS — R278 Other lack of coordination: Secondary | ICD-10-CM | POA: Diagnosis not present

## 2018-05-07 DIAGNOSIS — R262 Difficulty in walking, not elsewhere classified: Secondary | ICD-10-CM | POA: Diagnosis not present

## 2018-05-08 DIAGNOSIS — R262 Difficulty in walking, not elsewhere classified: Secondary | ICD-10-CM | POA: Diagnosis not present

## 2018-05-08 DIAGNOSIS — I6389 Other cerebral infarction: Secondary | ICD-10-CM | POA: Diagnosis not present

## 2018-05-08 DIAGNOSIS — R278 Other lack of coordination: Secondary | ICD-10-CM | POA: Diagnosis not present

## 2018-05-08 DIAGNOSIS — M6389 Disorders of muscle in diseases classified elsewhere, multiple sites: Secondary | ICD-10-CM | POA: Diagnosis not present

## 2018-05-09 DIAGNOSIS — R262 Difficulty in walking, not elsewhere classified: Secondary | ICD-10-CM | POA: Diagnosis not present

## 2018-05-09 DIAGNOSIS — I6389 Other cerebral infarction: Secondary | ICD-10-CM | POA: Diagnosis not present

## 2018-05-09 DIAGNOSIS — R278 Other lack of coordination: Secondary | ICD-10-CM | POA: Diagnosis not present

## 2018-05-09 DIAGNOSIS — M6389 Disorders of muscle in diseases classified elsewhere, multiple sites: Secondary | ICD-10-CM | POA: Diagnosis not present

## 2018-05-10 DIAGNOSIS — R262 Difficulty in walking, not elsewhere classified: Secondary | ICD-10-CM | POA: Diagnosis not present

## 2018-05-10 DIAGNOSIS — R278 Other lack of coordination: Secondary | ICD-10-CM | POA: Diagnosis not present

## 2018-05-10 DIAGNOSIS — I6389 Other cerebral infarction: Secondary | ICD-10-CM | POA: Diagnosis not present

## 2018-05-10 DIAGNOSIS — M6389 Disorders of muscle in diseases classified elsewhere, multiple sites: Secondary | ICD-10-CM | POA: Diagnosis not present

## 2018-05-11 DIAGNOSIS — R262 Difficulty in walking, not elsewhere classified: Secondary | ICD-10-CM | POA: Diagnosis not present

## 2018-05-11 DIAGNOSIS — M6281 Muscle weakness (generalized): Secondary | ICD-10-CM | POA: Diagnosis not present

## 2018-05-11 DIAGNOSIS — R278 Other lack of coordination: Secondary | ICD-10-CM | POA: Diagnosis not present

## 2018-05-14 DIAGNOSIS — H8113 Benign paroxysmal vertigo, bilateral: Secondary | ICD-10-CM | POA: Diagnosis not present

## 2018-05-14 DIAGNOSIS — R260 Ataxic gait: Secondary | ICD-10-CM | POA: Diagnosis not present

## 2018-05-14 DIAGNOSIS — M545 Low back pain: Secondary | ICD-10-CM | POA: Diagnosis not present

## 2018-05-14 DIAGNOSIS — M6281 Muscle weakness (generalized): Secondary | ICD-10-CM | POA: Diagnosis not present

## 2018-05-15 DIAGNOSIS — Z7901 Long term (current) use of anticoagulants: Secondary | ICD-10-CM | POA: Diagnosis not present

## 2018-05-15 DIAGNOSIS — E871 Hypo-osmolality and hyponatremia: Secondary | ICD-10-CM | POA: Diagnosis not present

## 2018-05-15 DIAGNOSIS — G459 Transient cerebral ischemic attack, unspecified: Secondary | ICD-10-CM | POA: Diagnosis not present

## 2018-05-16 DIAGNOSIS — H8113 Benign paroxysmal vertigo, bilateral: Secondary | ICD-10-CM | POA: Diagnosis not present

## 2018-05-16 DIAGNOSIS — M6281 Muscle weakness (generalized): Secondary | ICD-10-CM | POA: Diagnosis not present

## 2018-05-16 DIAGNOSIS — R260 Ataxic gait: Secondary | ICD-10-CM | POA: Diagnosis not present

## 2018-05-16 DIAGNOSIS — M545 Low back pain: Secondary | ICD-10-CM | POA: Diagnosis not present

## 2018-05-21 DIAGNOSIS — M545 Low back pain: Secondary | ICD-10-CM | POA: Diagnosis not present

## 2018-05-21 DIAGNOSIS — H8113 Benign paroxysmal vertigo, bilateral: Secondary | ICD-10-CM | POA: Diagnosis not present

## 2018-05-21 DIAGNOSIS — M6281 Muscle weakness (generalized): Secondary | ICD-10-CM | POA: Diagnosis not present

## 2018-05-21 DIAGNOSIS — R260 Ataxic gait: Secondary | ICD-10-CM | POA: Diagnosis not present

## 2018-05-23 DIAGNOSIS — M6281 Muscle weakness (generalized): Secondary | ICD-10-CM | POA: Diagnosis not present

## 2018-05-23 DIAGNOSIS — M545 Low back pain: Secondary | ICD-10-CM | POA: Diagnosis not present

## 2018-05-23 DIAGNOSIS — H8113 Benign paroxysmal vertigo, bilateral: Secondary | ICD-10-CM | POA: Diagnosis not present

## 2018-05-23 DIAGNOSIS — R260 Ataxic gait: Secondary | ICD-10-CM | POA: Diagnosis not present

## 2018-05-30 DIAGNOSIS — R260 Ataxic gait: Secondary | ICD-10-CM | POA: Diagnosis not present

## 2018-05-30 DIAGNOSIS — M545 Low back pain: Secondary | ICD-10-CM | POA: Diagnosis not present

## 2018-05-30 DIAGNOSIS — M6281 Muscle weakness (generalized): Secondary | ICD-10-CM | POA: Diagnosis not present

## 2018-05-30 DIAGNOSIS — H8113 Benign paroxysmal vertigo, bilateral: Secondary | ICD-10-CM | POA: Diagnosis not present

## 2018-06-04 DIAGNOSIS — M6281 Muscle weakness (generalized): Secondary | ICD-10-CM | POA: Diagnosis not present

## 2018-06-04 DIAGNOSIS — H8113 Benign paroxysmal vertigo, bilateral: Secondary | ICD-10-CM | POA: Diagnosis not present

## 2018-06-04 DIAGNOSIS — R260 Ataxic gait: Secondary | ICD-10-CM | POA: Diagnosis not present

## 2018-06-04 DIAGNOSIS — M545 Low back pain: Secondary | ICD-10-CM | POA: Diagnosis not present

## 2018-06-05 ENCOUNTER — Encounter: Payer: Self-pay | Admitting: Diagnostic Neuroimaging

## 2018-06-05 ENCOUNTER — Ambulatory Visit: Payer: Medicare Other | Admitting: Diagnostic Neuroimaging

## 2018-06-05 VITALS — BP 145/73 | HR 69 | Ht 59.0 in | Wt 123.2 lb

## 2018-06-05 DIAGNOSIS — G459 Transient cerebral ischemic attack, unspecified: Secondary | ICD-10-CM | POA: Diagnosis not present

## 2018-06-05 NOTE — Patient Instructions (Signed)
TIA - aspirin 81mg  + plavix 75mg  daily x 3 months; then stop aspirin  RESTLESS LEGS - continue ropinirole and gabapentin - optimize B12, iron levels  FATIGUE - optimize nutrition, exercise, sleep, routine - follow up B12 level

## 2018-06-05 NOTE — Progress Notes (Signed)
[facility]  PATIENT: Valerie Barker DOB: 23-Sep-1922  REFERRING CLINICIAN: Audie Pinto, MD HISTORY FROM: patient and son REASON FOR VISIT: new consult / existing patient   HISTORICAL  CHIEF COMPLAINT:  Chief Complaint  Patient presents with  . Transient Ischemic Attack    rm 7, hospital FU, son- Nicki Reaper, "was supposed to take ASA 81 mg after hospital, but hasn't been added to my pills"    HISTORY OF PRESENT ILLNESS:   UPDATE (06/05/18, VRP): Since last visit, patient returns for TIA follow-up.  Patient had episode of left field cut and left neglect, left facial droop in January 2020.  CT, CTA, MRI were unremarkable.  Echocardiogram, LDL and A1c were unremarkable.  Patient was recommended to use dual antiplatelet aspirin plus Plavix for 3 months and then reduce to plavix alone.  Patient did not start aspirin however for some reason. Patient was also recommended to have 30-day cardiac monitoring as outpatient to rule out atrial fibrillation --> no atrial fibrillation was found  PRIOR HPI (06/23/17): 85 year old female here for evaluation of restless leg syndrome, fatigue, low back pain, headaches.  Patient has history of restless leg syndrome for many years, managed on gabapentin and ropinirole.  Symptoms were poorly controlled a few months ago but have improved now.  Patient is satisfied with her current medication.  She has history of low B12 level.  Patient asking about generalized fatigue over the last 2-3 years.  Patient also has been widowed for last 3 years after her husband with dementia passed away.  She has decreased interest and activity to do on daily basis.  Patient also has low back pain rating to the right hip and leg since December 2018.  She has been going through physical therapy with mild relief.  She also uses tramadol and Tylenol with mild relief.  She had MRI of the lumbar spine which showed disc protrusion at L3-4 level towards the right side.   She is continuing to pursue conservative treatments.  Patient also having new onset of headache, mainly on the right side, for past 1 month.  Sometimes headache is quite severe and hits her all of a sudden.  At other times she has a dull sensation on the right side.  Sometimes she sees some abnormal vision sensations with these headaches.  Patient is concerned about these headaches as she has never had anything like this before in her life.   REVIEW OF SYSTEMS: Full 14 system review of systems performed and negative with exception of: Fatigue eye pain constipation dry mouth decreased energy weakness sleepiness restless legs.  ALLERGIES: Allergies  Allergen Reactions  . Duloxetine Other (See Comments)    unknown  . Erythromycin   . Mirtazapine     Other reaction(s): Other (See Comments) unknown  . Trazodone     Other reaction(s): Other (See Comments) unknown  . Zolpidem     Other reaction(s): Other (See Comments) unknown  . Cyclobenzaprine Anxiety    Unknown  . Diazepam Rash    unknown    HOME MEDICATIONS: Outpatient Medications Prior to Visit  Medication Sig Dispense Refill  . atorvastatin (LIPITOR) 10 MG tablet TAKE 1/2 TABLET EVERY DAY. (Patient taking differently: Take 5 mg by mouth every evening. ) 45 tablet 4  . Biotin 10000 MCG TABS Take 10,000 mcg by mouth every evening.     . Calcium Carbonate-Vitamin D (CALCIUM-D PO) Take 1 tablet by mouth every evening. 1200/500    . Cholecalciferol (VITAMIN D)  2000 UNITS CAPS Take 2,000 Units by mouth every morning.     . clopidogrel (PLAVIX) 75 MG tablet Take 75 mg by mouth daily.    . Cyanocobalamin (VITAMIN B-12 PO) Take 2,500 mcg by mouth every morning.     . DULoxetine (CYMBALTA) 20 MG capsule 20 mg daily.    Marland Kitchen gabapentin (NEURONTIN) 100 MG capsule Take 1 capsule (100 mg total) by mouth at bedtime. 30 capsule 0  . guaiFENesin (MUCINEX PO) Take 1,200 mg by mouth as needed.    . Multiple Vitamin (MULTIVITAMIN) capsule Take 1  capsule by mouth every morning.     Marland Kitchen rOPINIRole (REQUIP) 1 MG tablet 1 mg. Takes 1 at evening, 2 at bedtime    . valsartan (DIOVAN) 320 MG tablet Take 320 mg by mouth every morning.     Marland Kitchen aspirin EC 81 MG EC tablet Take 1 tablet (81 mg total) by mouth daily. (Patient not taking: Reported on 06/05/2018) 21 tablet 0  . senna-docusate (SENOKOT-S) 8.6-50 MG tablet Take 1 tablet by mouth at bedtime as needed for mild constipation. (Patient not taking: Reported on 06/05/2018) 30 tablet 0  . rOPINIRole (REQUIP) 0.5 MG tablet Take 0.5-1 mg by mouth See admin instructions. Take 1 tablet (.5mg ) by mouth every evening, and 2 tablets (1mg ) every night     No facility-administered medications prior to visit.     PAST MEDICAL HISTORY: Past Medical History:  Diagnosis Date  . Heart disease   . Hypertension   . IBS (irritable bowel syndrome)   . Stroke ([place]) 04/17/2018   around 2011  . TIA (transient ischemic attack)     PAST SURGICAL HISTORY: Past Surgical History:  Procedure Laterality Date  . ABDOMINAL HYSTERECTOMY  1978  . BUNIONECTOMY  2006, 1996  . LAPAROSCOPIC SALPINGO OOPHERECTOMY  1988    FAMILY HISTORY: Family History  Problem Relation Age of Onset  . COPD Mother   . Schizophrenia Brother   . Stroke Father   . Breast cancer Neg Hx     SOCIAL HISTORY:  Social History   Socioeconomic History  . Marital status: Married    Spouse name: Not on file  . Number of children: Not on file  . Years of education: Not on file  . Highest education level: Not on file  Occupational History  . Not on file  Social Needs  . Financial resource strain: Not on file  . Food insecurity:    Worry: Not on file    Inability: Not on file  . Transportation needs:    Medical: Not on file    Non-medical: Not on file  Tobacco Use  . Smoking status: Never Smoker  . Smokeless tobacco: Never Used  Substance and Sexual Activity  . Alcohol use: No  . Drug use: No  . Sexual activity: Not on file    Lifestyle  . Physical activity:    Days per week: Not on file    Minutes per session: Not on file  . Stress: Not on file  Relationships  . Social connections:    Talks on phone: Not on file    Gets together: Not on file    Attends religious service: Not on file    Active member of club or organization: Not on file    Attends meetings of clubs or organizations: Not on file    Relationship status: Not on file  . Intimate partner violence:    Fear of current or ex partner: Not on  file    Emotionally abused: Not on file    Physically abused: Not on file    Forced sexual activity: Not on file  Other Topics Concern  . Not on file  Social History Narrative   Lives in [place] at [place].  Education BA degree.  Retired.  3 children.  Son, [place].  Caffeine 1=2 cups daily.     PHYSICAL EXAM  GENERAL EXAM/CONSTITUTIONAL: Vitals:  Vitals:   06/05/18 1321  BP: (!) 145/73  Pulse: 69  Weight: 123 lb 3.2 oz (55.9 kg)  Height: 4\' 11"  (1.499 m)   Body mass index is 24.88 kg/m. No exam data present  Patient is in no distress; well developed, nourished and groomed; neck is supple  CARDIOVASCULAR:  Examination of carotid arteries is normal; no carotid bruits  Regular rate and rhythm, no murmurs  Examination of peripheral vascular system by observation and palpation is normal  EYES:  Ophthalmoscopic exam of optic discs and posterior segments is normal; no papilledema or hemorrhages  MUSCULOSKELETAL:  Gait, strength, tone, movements noted in Neurologic exam below  NEUROLOGIC: MENTAL STATUS:  No flowsheet data found.  awake, alert, oriented to person, place and time  recent and remote memory intact  normal attention and concentration  language fluent, comprehension intact, naming intact,   fund of knowledge appropriate  CRANIAL NERVE:   2nd - no papilledema on fundoscopic exam  2nd, 3rd, 4th, 6th - pupils equal and reactive to light, visual fields  full to confrontation, extraocular muscles intact, no nystagmus  5th - facial sensation symmetric  7th - facial strength symmetric  8th - hearing intact  9th - palate elevates symmetrically, uvula midline  11th - shoulder shrug symmetric  12th - tongue protrusion midline  MOTOR:   normal bulk and tone, full strength in the BUE, BLE  SENSORY:   normal and symmetric to light touch, temperature, vibration; DECR VIBRATION AT ANKLES AND TOES  COORDINATION:   finger-nose-finger, fine finger movements normal  REFLEXES:   deep tendon reflexes TRACE and symmetric  GAIT/STATION:   narrow based gait; SLOW CAUTIOUS GAIT; USES WALKER    DIAGNOSTIC DATA (LABS, IMAGING, TESTING) - I reviewed patient records, labs, notes, testing and imaging myself where available.  Lab Results  Component Value Date   WBC 6.6 04/19/2018   HGB 11.6 (L) 04/19/2018   HCT 35.4 (L) 04/19/2018   MCV 90.8 04/19/2018   PLT 164 04/19/2018      Component Value Date/Time   NA 138 04/19/2018 0621   K 3.8 04/19/2018 0621   CL 105 04/19/2018 0621   CO2 24 04/19/2018 0621   GLUCOSE 104 (H) 04/19/2018 0621   BUN 13 04/19/2018 0621   CREATININE 0.90 04/19/2018 0621   CALCIUM 8.9 04/19/2018 0621   PROT 5.7 (L) 04/19/2018 0621   ALBUMIN 3.3 (L) 04/19/2018 0621   AST 25 04/19/2018 0621   ALT 20 04/19/2018 0621   ALKPHOS 42 04/19/2018 0621   BILITOT 0.8 04/19/2018 0621   GFRNONAA 55 (L) 04/19/2018 0621   GFRAA >60 04/19/2018 0621   Lab Results  Component Value Date   CHOL 128 04/18/2018   HDL 67 04/18/2018   LDLCALC 48 04/18/2018   TRIG 67 04/18/2018   CHOLHDL 1.9 04/18/2018   Lab Results  Component Value Date   HGBA1C 5.6 04/18/2018   Lab Results  Component Value Date   VITAMINB12 164 (L) 04/29/2009   Lab Results  Component Value Date   TSH 2.19 10/09/2015  01/28/13 MRI HEAD   - No acute intracranial findings are evident. There is advanced atrophy with chronic microvascular  ischemic change.  01/28/13 MRA HEAD  - No intracranial flow reducing lesion is evident.  05/20/17 MRI lumbar spine [I reviewed images myself and agree with interpretation. -VRP] - Moderately large right-sided disc protrusion L3-4 with impingement of the right L3 nerve root in the subarticular zone. Mild spinal stenosis. - Grade 1 anterolisthesis L4-5 with mild spinal stenosis.  07/05/17 MRI brain - Moderate generalized atrophy.  - Mild-moderate chronic small vessel ischemic disease. - No acute findings. - No significant change from prior MRI on 01/28/13.   04/17/18 CTA head / neck - No large or medium vessel occlusion or correctable proximal stenosis.  04/18/18 MRI brain  1. No acute intracranial abnormality. 2. Extensive chronic small vessel ischemic disease.  05/03/18 - 30 day heart monitor - normal sinus rhythm; no atrial fibrillation  04/18/18 echocardiogram - Left ventricle: The cavity size was normal. Wall thickness was   normal. Systolic function was normal. The estimated ejection   fraction was in the range of 60% to 65%. Wall motion was normal;   there were no regional wall motion abnormalities. Features are   consistent with a pseudonormal left ventricular filling pattern,   with concomitant abnormal relaxation and increased filling   pressure (grade 2 diastolic dysfunction).     ASSESSMENT AND PLAN  85 y.o. year old female here with:  Dx:  1. TIA (transient ischemic attack)     PLAN:  TIA (Jan 2020) - aspirin 81mg  + plavix 75mg  daily x 3 months; then stop aspirin  RESTLESS LEGS - continue ropinirole and gabapentin - optimize B12, iron levels  FATIGUE - optimize nutrition, exercise, sleep, routine - follow up B12 level  RIGHT LOWER BACK PAIN / HIP PAIN - continue conservative mgmt / PT  Return for return to PCP, pending if symptoms worsen or fail to improve.    Penni Bombard, MD 11/05/3823, 0:53 PM Certified in Neurology, Neurophysiology and  Neuroimaging  [facility] [address], [place], [place] [address] [phone]

## 2018-06-06 DIAGNOSIS — M545 Low back pain: Secondary | ICD-10-CM | POA: Diagnosis not present

## 2018-06-06 DIAGNOSIS — H8113 Benign paroxysmal vertigo, bilateral: Secondary | ICD-10-CM | POA: Diagnosis not present

## 2018-06-06 DIAGNOSIS — R260 Ataxic gait: Secondary | ICD-10-CM | POA: Diagnosis not present

## 2018-06-06 DIAGNOSIS — M6281 Muscle weakness (generalized): Secondary | ICD-10-CM | POA: Diagnosis not present

## 2018-06-11 ENCOUNTER — Telehealth: Payer: Self-pay

## 2018-06-11 NOTE — Telephone Encounter (Signed)
Notes recorded by Marval Regal, RN on 06/11/2018 at 3:49 PM EDT I called patient about her cardiac monitor results. I stated it was negative for any irregular heat beat called atrial fibrillation. Continue treatment plan.Pt verbalized understanding.

## 2018-06-11 NOTE — Telephone Encounter (Signed)
-----   Message from Rosalin Hawking, MD sent at 06/10/2018  7:35 PM EDT ----- Could you please let the patient know that the heart monitoring test done recently was negative for irregular heart beat called afib. Please continue current treatment. Thanks.  Rosalin Hawking, MD PhD Stroke Neurology 06/10/2018 7:34 PM

## 2018-06-12 DIAGNOSIS — E538 Deficiency of other specified B group vitamins: Secondary | ICD-10-CM | POA: Diagnosis not present

## 2018-06-12 DIAGNOSIS — I10 Essential (primary) hypertension: Secondary | ICD-10-CM | POA: Diagnosis not present

## 2018-06-12 DIAGNOSIS — D638 Anemia in other chronic diseases classified elsewhere: Secondary | ICD-10-CM | POA: Diagnosis not present

## 2018-06-12 DIAGNOSIS — Z0000 Encounter for general adult medical examination without abnormal findings: Secondary | ICD-10-CM | POA: Diagnosis not present

## 2018-06-12 DIAGNOSIS — N390 Urinary tract infection, site not specified: Secondary | ICD-10-CM | POA: Diagnosis not present

## 2018-06-12 DIAGNOSIS — Z131 Encounter for screening for diabetes mellitus: Secondary | ICD-10-CM | POA: Diagnosis not present

## 2018-06-12 DIAGNOSIS — M810 Age-related osteoporosis without current pathological fracture: Secondary | ICD-10-CM | POA: Diagnosis not present

## 2018-06-13 DIAGNOSIS — M545 Low back pain: Secondary | ICD-10-CM | POA: Diagnosis not present

## 2018-06-13 DIAGNOSIS — H8113 Benign paroxysmal vertigo, bilateral: Secondary | ICD-10-CM | POA: Diagnosis not present

## 2018-06-13 DIAGNOSIS — R260 Ataxic gait: Secondary | ICD-10-CM | POA: Diagnosis not present

## 2018-06-13 DIAGNOSIS — M6281 Muscle weakness (generalized): Secondary | ICD-10-CM | POA: Diagnosis not present

## 2018-06-19 ENCOUNTER — Encounter: Payer: Self-pay | Admitting: Gastroenterology

## 2018-06-26 ENCOUNTER — Other Ambulatory Visit: Payer: Self-pay

## 2018-06-26 ENCOUNTER — Other Ambulatory Visit: Payer: Self-pay | Admitting: Cardiovascular Disease

## 2018-06-26 NOTE — Patient Outreach (Signed)
Valerie Barker [facility]) Care Management  06/26/2018  Valerie Barker Valerie Barker [birth year] [id number]   Medication Adherence call to Valerie Barker patient did not answer patient is due on Valsartan 320 mg I spoke with Valerie Barker they said they pill pack every 28 days on a regular basis.patient is showing past due under Valerie Barker.   Valerie Barker Management Direct Dial [phone]  Fax [phone] [email or web address]

## 2018-06-29 ENCOUNTER — Telehealth: Payer: Self-pay | Admitting: Emergency Medicine

## 2018-06-29 NOTE — Telephone Encounter (Signed)
Appointment made for Monday 3/30 she is very grateful.

## 2018-06-29 NOTE — Telephone Encounter (Signed)
Please make a 30 minute telephone visit. It would be an honor to be able to provider some assistance to her. Thanks.

## 2018-06-29 NOTE — Telephone Encounter (Signed)
Spoke with patient about her 4/17 appointment, although the referral states the visit is for loose stools she states she is actually having extreme constipation. She states she is ok right now but would like to talk to you. She is in a nursing home and on lockdown due to [phone]. She does not have a smart phone or a computer.

## 2018-07-02 ENCOUNTER — Other Ambulatory Visit: Payer: Self-pay

## 2018-07-02 ENCOUNTER — Encounter: Payer: Self-pay | Admitting: Gastroenterology

## 2018-07-02 ENCOUNTER — Ambulatory Visit (INDEPENDENT_AMBULATORY_CARE_PROVIDER_SITE_OTHER): Payer: Medicare Other | Admitting: Gastroenterology

## 2018-07-02 DIAGNOSIS — K5900 Constipation, unspecified: Secondary | ICD-10-CM | POA: Diagnosis not present

## 2018-07-02 NOTE — Patient Instructions (Addendum)
Drink plenty of water every day (at least 64 ounces)  Continue to take your Senekot supplemented with prune juice every day.  Consider additional applesauce and bran to your prune juice, as a way to control your constipation without medications.    I recommend that you use Miralax 17 grams daily as needed if your constipation is not adequately controlled with the Senekot and prune juice.  We will plan some imaging studies when you are able to leave [facility]  Please keep a stool diary so that we will know what is working.  Follow-up with me in one month, or earlier as needed.  Thank you for your patience with me and our technology today! Please stay safe and healthy. I look forward to meeting you in person in the future.

## 2018-07-02 NOTE — Progress Notes (Signed)
TELEHEALTH VISIT  Referring Provider: Deland Pretty, MD Primary Care Physician:  Deland Pretty, MD   Tele-visit due to COVID-19 pandemic Patient requested visit virtually, consented to the encounter telephone call Patient seems aware of limitations, risks, security and privacy concerns of performing an evaluation and management service by telephone and the ongoing risk and availability of in-person appointments  Contact made at: 2:30pm 07/02/18 Patient verified by name and date of birth Location of patient: Home Location provider: [facility] Names of persons participating: Me, patient, [name] (daughter, [phone]) Time spent on call: 49 minutes   Reason for Consultation:  Constipation   IMPRESSION:  Chronic constipation without alarm features Narrowed left colon present on colonoscopy in 1997 and 2002 Left-sided diverticulosis Colonoscopy with Dr. Olevia Perches 07/27/2000  Chronic constipation that has recently improved on daily Senekot and prune juice. No alarm features. Mostly concerned because of the unexpected, sudden death of her sister attributed to an impaction.  Narrowed colon noted on colonoscopy may be related to diverticulosis based on prior colonoscopy reports. Ideally, would perform cross-sectional imaging or [place] study to confirm her anatomy and exclude polyp, mass, or stricture as well as further characterize the type of constipation.    Discussed diagnosis of diverticulosis. Multiple questions answered.   PLAN: Drink plenty of water every day (at least 64 ounces) High fiber diet recommended - including use of psyllium for stool bulking Check TSH and calcium if they have not previously been obtained by Dr. Shelia Media Continue Senekot supplemented with prune juice Discussed possible combination of prune juice with applesauce and bran Use Miralax PRN if constipation is not adequately controlled Cross-sectional imaging when she is able to  leave [place] She will keep a stool diary Follow-up with me in one month, or earlier as needed  49 minutes spent with the patient today. Greater than 50% was spent in counseling and coordination of care with the patient   HPI: Valerie Barker is a 85 y.o. female referred by Dr. Shelia Media.  She has hypertension, TIA in January on Plavix, stroke, coronary artery disease, and carries a diagnosis of irritable bowel syndrome. She requested a phone call because she is unable to leave [place] in the setting of Covid19.  Lifetime history of constipation. Having a BM every 5-6 days with an aggressive regimen. Previously taking Miralax - but she doesn't remember the details.  Takes Metamucil but does not feel it was working.  Pharmacist recommended stool softener with laxative (generic Senekot) which she started last week. Taking twice daily with 7/8 of a cup of warm prune juice. After 5 days, she took 3 laxatives and prune juice. Yesterday she resumed 2 pills and prune juice. She had a good BM today.   No abdominal pain. But bloating if no BM after 4-5 days. No associated blood or mucous in the stool. Intermittent straining. No diarrhea. Sense of complete defecation. Weight is stable recently. She has lost some weight after her husband died 3.5 years ago. Appetite is okay but she doesn't enjoy eating alone, which she has had to do during the [place] issue.   Previously evaluated by a GI who thought she needed surgery. By her description it sounds like a possible issue with her anal canal. She does not remember the name of the physician who made the recommendation. Dr. Shelia Media said no to surgery.  Approximately 6 weeks ago, she tried to clarify this history with Dr. Shelia Media. So far, she has been unable  to locate supporting documents.   Would prefer an office visit when able. But is grateful for phone care as she is unable to leave her apartment.   Would like to not use medications to have a bowel  movement.   Believes her constipation is hereditary. Mother, grandmother, sister, and daughters have it. Sister died in Jan 23, 2023 due to impacted bowel (listed on death certificate). Her family is concerned that the same thing will happen to her.   Prior colonoscopy with Dr. Olevia Perches. Reviewed the procedure note from 07/27/2000 performed for constipation with infrequent stools, difficulty evacuating, and straining with stool passage. Procedure notes diverticulosis in the sigmoid and descending colon with marked narrowing of the left colon. Dr. Olevia Perches mentions that this was also seen on her colonoscopy in 1997.  I am unable to locate and prior imaging in [facility].  No known family history of colon cancer or polyps. No family history of uterine/endometrial cancer, pancreatic cancer or gastric/stomach cancer.  Past Medical History:  Diagnosis Date  . Heart disease   . Hypertension   . IBS (irritable bowel syndrome)   . Stroke ([place]) 04/17/2018   around 2011  . TIA (transient ischemic attack)     Past Surgical History:  Procedure Laterality Date  . ABDOMINAL HYSTERECTOMY  1978  . BUNIONECTOMY  2006, 1996  . LAPAROSCOPIC SALPINGO OOPHERECTOMY  1988    Current Outpatient Medications  Medication Sig Dispense Refill  . aspirin EC 81 MG EC tablet Take 1 tablet (81 mg total) by mouth daily. (Patient not taking: Reported on 06/05/2018) 21 tablet 0  . atorvastatin (LIPITOR) 10 MG tablet TAKE 1/2 TABLET EVERY DAY. 45 tablet 2  . Biotin 10000 MCG TABS Take 10,000 mcg by mouth every evening.     . Calcium Carbonate-Vitamin D (CALCIUM-D PO) Take 1 tablet by mouth every evening. 1200/500    . Cholecalciferol (VITAMIN D) 2000 UNITS CAPS Take 2,000 Units by mouth every morning.     . clopidogrel (PLAVIX) 75 MG tablet Take 75 mg by mouth daily.    . Cyanocobalamin (VITAMIN B-12 PO) Take 2,500 mcg by mouth every morning.     . DULoxetine (CYMBALTA) 20 MG capsule 20 mg daily.    Marland Kitchen gabapentin (NEURONTIN) 100 MG  capsule Take 1 capsule (100 mg total) by mouth at bedtime. 30 capsule 0  . guaiFENesin (MUCINEX PO) Take 1,200 mg by mouth as needed.    . Multiple Vitamin (MULTIVITAMIN) capsule Take 1 capsule by mouth every morning.     Marland Kitchen rOPINIRole (REQUIP) 1 MG tablet 1 mg. Takes 1 at evening, 2 at bedtime    . senna-docusate (SENOKOT-S) 8.6-50 MG tablet Take 1 tablet by mouth at bedtime as needed for mild constipation. (Patient not taking: Reported on 06/05/2018) 30 tablet 0  . valsartan (DIOVAN) 320 MG tablet Take 320 mg by mouth every morning.      No current facility-administered medications for this visit.     Allergies as of 07/02/2018 - Review Complete 06/05/2018  Allergen Reaction Noted  . Duloxetine Other (See Comments) 08/26/2015  . Erythromycin  07/01/2013  . Mirtazapine  08/26/2015  . Trazodone  08/26/2015  . Zolpidem  08/26/2015  . Cyclobenzaprine Anxiety 08/26/2015  . Diazepam Rash 08/26/2015    Family History  Problem Relation Age of Onset  . COPD Mother   . Schizophrenia Brother   . Stroke Father   . Breast cancer Neg Hx     Social History   Socioeconomic History  .  Marital status: Married    Spouse name: Not on file  . Number of children: Not on file  . Years of education: Not on file  . Highest education level: Not on file  Occupational History  . Not on file  Social Needs  . Financial resource strain: Not on file  . Food insecurity:    Worry: Not on file    Inability: Not on file  . Transportation needs:    Medical: Not on file    Non-medical: Not on file  Tobacco Use  . Smoking status: Never Smoker  . Smokeless tobacco: Never Used  Substance and Sexual Activity  . Alcohol use: No  . Drug use: No  . Sexual activity: Not on file  Lifestyle  . Physical activity:    Days per week: Not on file    Minutes per session: Not on file  . Stress: Not on file  Relationships  . Social connections:    Talks on phone: Not on file    Gets together: Not on file     Attends religious service: Not on file    Active member of club or organization: Not on file    Attends meetings of clubs or organizations: Not on file    Relationship status: Not on file  . Intimate partner violence:    Fear of current or ex partner: Not on file    Emotionally abused: Not on file    Physically abused: Not on file    Forced sexual activity: Not on file  Other Topics Concern  . Not on file  Social History Narrative   Lives in [place] at [place].  Education BA degree.  Retired.  3 children.  Son, [place].  Caffeine 1=2 cups daily.    Review of Systems: ALL ROS discussed and all others negative except listed in HPI.  Physical Exam: General: in no acute distress Neuro: Alert and appropriate Psych: Normal affect and normal insight   Diona Peregoy L. Tarri Glenn, MD, MPH [facility] 07/02/2018, 11:08 AM

## 2018-07-20 ENCOUNTER — Ambulatory Visit: Payer: Medicare Other | Admitting: Gastroenterology

## 2018-07-23 NOTE — Addendum Note (Signed)
Addended by: Donell Beers on: 07/23/2018 08:56 PM   Modules accepted: Level of Service

## 2018-10-02 ENCOUNTER — Telehealth: Payer: Self-pay | Admitting: Cardiovascular Disease

## 2018-10-02 NOTE — Telephone Encounter (Signed)
Pt cx 10-03-18 appt with Dr Claiborne Billings. She is under lock down at her facility, because of COVID-19.

## 2018-10-03 ENCOUNTER — Ambulatory Visit: Payer: Medicare Other | Admitting: Cardiovascular Disease

## 2018-11-28 DIAGNOSIS — E039 Hypothyroidism, unspecified: Secondary | ICD-10-CM | POA: Diagnosis not present

## 2018-11-28 DIAGNOSIS — Z23 Encounter for immunization: Secondary | ICD-10-CM | POA: Diagnosis not present

## 2018-11-28 DIAGNOSIS — R5383 Other fatigue: Secondary | ICD-10-CM | POA: Diagnosis not present

## 2018-11-28 DIAGNOSIS — G4720 Circadian rhythm sleep disorder, unspecified type: Secondary | ICD-10-CM | POA: Diagnosis not present

## 2019-03-25 DIAGNOSIS — Z1159 Encounter for screening for other viral diseases: Secondary | ICD-10-CM | POA: Diagnosis not present

## 2019-03-26 ENCOUNTER — Other Ambulatory Visit: Payer: Self-pay | Admitting: Cardiovascular Disease

## 2019-04-01 ENCOUNTER — Other Ambulatory Visit: Payer: Self-pay | Admitting: Cardiovascular Disease

## 2019-04-01 DIAGNOSIS — Z1159 Encounter for screening for other viral diseases: Secondary | ICD-10-CM | POA: Diagnosis not present

## 2019-04-08 DIAGNOSIS — Z1159 Encounter for screening for other viral diseases: Secondary | ICD-10-CM | POA: Diagnosis not present

## 2019-04-09 ENCOUNTER — Telehealth (INDEPENDENT_AMBULATORY_CARE_PROVIDER_SITE_OTHER): Payer: Medicare Other | Admitting: Cardiovascular Disease

## 2019-04-09 ENCOUNTER — Encounter: Payer: Self-pay | Admitting: Cardiovascular Disease

## 2019-04-09 ENCOUNTER — Telehealth: Payer: Self-pay | Admitting: *Deleted

## 2019-04-09 DIAGNOSIS — G2581 Restless legs syndrome: Secondary | ICD-10-CM | POA: Diagnosis not present

## 2019-04-09 DIAGNOSIS — M7989 Other specified soft tissue disorders: Secondary | ICD-10-CM

## 2019-04-09 DIAGNOSIS — I10 Essential (primary) hypertension: Secondary | ICD-10-CM

## 2019-04-09 DIAGNOSIS — R5383 Other fatigue: Secondary | ICD-10-CM

## 2019-04-09 DIAGNOSIS — E7800 Pure hypercholesterolemia, unspecified: Secondary | ICD-10-CM

## 2019-04-09 DIAGNOSIS — Z8673 Personal history of transient ischemic attack (TIA), and cerebral infarction without residual deficits: Secondary | ICD-10-CM

## 2019-04-09 NOTE — Patient Instructions (Addendum)
Medication Instructions:  TAKE: half a tablet of the Hydrochlorothiazide (12.5 mg) for the next 2-3 days (on January 6th, 7th, and 8th) then you may take it as needed for swelling.  *If you need a refill on your cardiac medications before your next appointment, please call your pharmacy*  Lab Work: Your provider would like for you to have the following labs: Lipid, CMET, CBC and TSH  If you have labs (blood work) drawn today and your tests are completely normal, you will receive your results only by: Marland Kitchen MyChart Message (if you have MyChart) OR . A paper copy in the mail If you have any lab test that is abnormal or we need to change your treatment, we will call you to review the results.  Testing/Procedures: None ordered  Follow-Up: At [facility], you and your health needs are our priority.  As part of our continuing mission to provide you with exceptional heart care, we have created designated Provider Care Teams.  These Care Teams include your primary Cardiologist (physician) and Advanced Practice Providers (APPs -  Physician Assistants and Nurse Practitioners) who all work together to provide you with the care you need, when you need it.  Your next appointment:   3 month(s)  The format for your next appointment:   In Person  Provider:   Shelva Majestic, MD

## 2019-04-09 NOTE — Progress Notes (Signed)
Virtual Visit via Telephone Note   This visit type was conducted due to national recommendations for restrictions regarding the COVID-19 Pandemic (e.g. social distancing) in an effort to limit this patient's exposure and mitigate transmission in our community.  Due to her co-morbid illnesses, this patient is at least at moderate risk for complications without adequate follow up.  This format is felt to be most appropriate for this patient at this time.  The patient did not have access to video technology/had technical difficulties with video requiring transitioning to audio format only (telephone).  All issues noted in this document were discussed and addressed.  No physical exam could be performed with this format.  Please refer to the patient's chart for her  consent to telehealth for [facility].   Date:  04/11/2019   ID:  Valerie Barker, DOB Aug 21, 1922, MRN [id number]  Patient Location: Home Provider Location: Home  PCP:  Deland Pretty, MD  Cardiologist:  Shelva Majestic, MD Electrophysiologist:  None   Evaluation Performed:  Follow-Up Visit  Chief Complaint: 45-month follow-up  History of Present Illness:    Valerie Barker is a 86 y.o. female who is the wife of my former patient Dr. Buckner Barker who died on 04/09/15.  Remotely, Valerie Barker suffered a TIA and has documented old asymptomatic lacunar infarcts. Prior to undergoing urologic surgery at [facility] in 2012 a nuclear perfusion study showed normal perfusion. An echo Doppler study demonstrated mild to moderate TR, trace MR, and mild aortic sclerosis without stenosis. She had normal systolic function. Carotid studies done in 2011 were normal.  She was  hospitalized for 2 nights after experiencing an TIA on 01/08/2013. Her symptoms resolved spontaneously. A CT of her head showed mild atrophy with extensive supratentorial small vessel disease without intracranial mass, hemorrhage or acute appearing infarction. An  MRI of her brain did not show any acute intracranial findings but did show advanced atrophy with chronic microvascular ischemic changes. MRA did not reveal any intracranial flow reducing lesions. During that hospitalization a f/u echo Doppler study  on 01/29/2013 showed an ejection fraction at 65%. There was mild aortic sclerosis without stenosis. Carotid Doppler study showed minimal plaque with less than 39% reduction. She  was sent home on atorvastatin 10 mg Plavix 75 mg in addition to her valsartan HCT 320/12.5 and zolpidem.  She has had issues with swelling of legs and feet.  She also has continued issues with restless legs.  She has been taking requip 0.5 mg shortly before going to bed, but it takes a while for the medication to work and usually does not completely suffice.  At times she has taken a half a pill if she is to have a daytime nap.  When I last saw her, she was complaining of experiencing a "bobble" sensation.  She underwent carotid duplex imaging which was normal in November 2016.  She denies any episodes of chest pain.  She is unaware of palpitations.  She admits to being fatigued , but actually feels improved from previously.  At times if she is on her back for certain duration.  She does note transient right arm paresthesias.  She denies associated chest pressure.  She presents for evaluation.  She underwent extensive dental work with her dentist, Chief Financial Officer, and endodontist.  She also had eyelid lift surgery at  [facility] in December 2017 and tolerated this well from a cardiovascular standpoint.  When I last saw she felt well was experiencing significant  tiredness and fatigue.  At times she noted  some dizziness with movement.  She has restless legs for which she is on requip in addition to gabapentin.  She denied chest pain.  Was unaware of palpitations.  I saw her in May 2019 following an episode of dizziness.  As she was getting off the toilet and was trying to get  or toilet paper she bent over into the cabinet and when she stood up she became dizzy lightheaded fell backwards and hit her head.  Since she was on chronic Plavix therapy she presented to the emergency room for evaluation.  No acute abnormality was noted.  However, her pulse in the emergency room was 52 and blood pressure 107/41.  She has been on losartan HCT 100/25 mg, Toprol-XL 12.5 mg and this has been held if her heart rate is less than 55.  I reviewed her emergency room evaluation.  At that time, I elected to wean and discontinue her very low-dose Toprol-XL 12.5 mg.    I last evaluated her in September 2019.  Her 13 year old sister passed away 4 weeks prior to that evaluation.  She had spoken to her sister on a daily basis.  Valerie Barker continues to live in independent living at [facility].  She denies chest pain.  She is unaware of palpitations.  She has not required use of HCTZ.  Continues to be on valsartan 320 mg for hypertension.  She is tolerating atorvastatin for hyperlipidemia.  Since I last saw her, she has continued to live in independent living at [facility].  She denies any chest pain or shortness of breath or palpitations.  She admits to significant fatigability.  She believes she is sleeping a lot of the time.  She does sleep well at night.  She has not been as active as she had in the past due to the COVID-19 pandemic.  She is concerned about the possibility of early Alzheimer's disease since she is starting to notice some short-term memory loss.  She also admits to swelling in her right foot.  She was recently given a medicine by Dr. Shelia Media of HCTZ 25 mg but she had not started that yet.  She presents for telemedicine visit.  The patient does not have symptoms concerning for COVID-19 infection (fever, chills, cough, or new shortness of breath).    Past Medical History:  Diagnosis Date  . Heart disease   . Hypertension   . IBS (irritable bowel syndrome)   . Stroke ([place])  04/17/2018   around 2011  . TIA (transient ischemic attack)    Past Surgical History:  Procedure Laterality Date  . ABDOMINAL HYSTERECTOMY  1978  . BUNIONECTOMY  2006, 1996  . LAPAROSCOPIC SALPINGO OOPHERECTOMY  1988     Current Meds  Medication Sig  . aspirin EC 81 MG EC tablet Take 1 tablet (81 mg total) by mouth daily.  Marland Kitchen atorvastatin (LIPITOR) 10 MG tablet TAKE 1/2 TABLET EVERY DAY.  Marland Kitchen Biotin 10000 MCG TABS Take 10,000 mcg by mouth every evening.   . Calcium Carbonate-Vitamin D (CALCIUM-D PO) Take 1 tablet by mouth every evening. 1200/500  . Cholecalciferol (VITAMIN D) 2000 UNITS CAPS Take 2,000 Units by mouth every morning.   . clopidogrel (PLAVIX) 75 MG tablet Take 75 mg by mouth daily.  . Cyanocobalamin (VITAMIN B-12 PO) Take 2,500 mcg by mouth every morning.   . DULoxetine (CYMBALTA) 20 MG capsule 20 mg daily.  Marland Kitchen gabapentin (NEURONTIN) 100 MG capsule Take 1  capsule (100 mg total) by mouth at bedtime.  Marland Kitchen guaiFENesin (MUCINEX PO) Take 1,200 mg by mouth as needed.  . hydrochlorothiazide (HYDRODIURIL) 25 MG tablet Take 12.5 mg by mouth daily. As needed for swelling  . levothyroxine (SYNTHROID) 25 MCG tablet Take 25 mcg by mouth daily.  . Multiple Vitamin (MULTIVITAMIN) capsule Take 1 capsule by mouth every morning.   Marland Kitchen rOPINIRole (REQUIP) 1 MG tablet 1 mg. Takes 1 at evening, 2 at bedtime  . senna-docusate (SENOKOT-S) 8.6-50 MG tablet Take 1 tablet by mouth at bedtime as needed for mild constipation.  . valsartan (DIOVAN) 320 MG tablet Take 320 mg by mouth every morning.      Allergies:   Duloxetine, Erythromycin, Mirtazapine, Trazodone, Zolpidem, Cyclobenzaprine, and Diazepam   Social History   Tobacco Use  . Smoking status: Never Smoker  . Smokeless tobacco: Never Used  Substance Use Topics  . Alcohol use: No  . Drug use: No    Socially she is widowed as of December 2016.  Her husband was a former Scientist, physiological of education that [employer] and developed progressive  dementia prior to his death  Family Hx: The patient's family history includes COPD in her mother; Schizophrenia in her brother; Stroke in her father. There is no history of Breast cancer.  ROS:   Please see the history of present illness.    General: Negative; No fevers, chills, or night sweats;  HEENT: Negative; No changes in vision or hearing, sinus congestion, difficulty swallowing Pulmonary: Negative; No cough, wheezing, shortness of breath, hemoptysis Cardiovascular: Negative; No chest pain, presyncope, syncope, palpitations Positive for leg swelling  GI: Negative; No nausea, vomiting, diarrhea, or abdominal pain GU: Positive for recurrent urinary incontinence despite her prior urologic surgery; No dysuria, hematuria, or difficulty voiding Musculoskeletal: Negative; no myalgias, joint pain, or weakness Hematologic/Oncology: Negative; no easy bruising, bleeding Endocrine: Negative; no heat/cold intolerance; no diabetes Neuro: Positive for decreased short-term memory and history of TIA; Episode of right arm numbness in July 2017; no changes in balance, headaches Skin: Negative; No rashes or skin lesions Psychiatric: Negative; No behavioral problems, depression Sleep: Positive for restless leg syndrome; No snoring, daytime sleepiness, hypersomnolence, bruxism, hypnogognic hallucinations, no cataplexy Other comprehensive 14 point system review is negative.   Prior CV studies:   The following studies were reviewed today:  ------------------------------------------------------------------- ECHO 04/18/2018 Study Conclusions  - Left ventricle: The cavity size was normal. Wall thickness was   normal. Systolic function was normal. The estimated ejection   fraction was in the range of 60% to 65%. Wall motion was normal;   there were no regional wall motion abnormalities. Features are   consistent with a pseudonormal left ventricular filling pattern,   with concomitant abnormal relaxation  and increased filling   pressure (grade 2 diastolic dysfunction).   Labs/Other Tests and Data Reviewed:    EKG:  An ECG dated 9/25/22019 was personally reviewed today and demonstrated:  NSR at 61; no ectopy; normal intervals  Recent Labs: 04/19/2018: ALT 20; BUN 13; Creatinine, Ser 0.90; Hemoglobin 11.6; Magnesium 1.9; Platelets 164; Potassium 3.8; Sodium 138   Recent Lipid Panel Lab Results  Component Value Date/Time   CHOL 128 04/18/2018 04:58 AM   TRIG 67 04/18/2018 04:58 AM   HDL 67 04/18/2018 04:58 AM   CHOLHDL 1.9 04/18/2018 04:58 AM   LDLCALC 48 04/18/2018 04:58 AM    Wt Readings from Last 3 Encounters:  04/09/19 130 lb (59 kg)  06/05/18 123 lb 3.2 oz (55.9 kg)  04/11/18 124 lb (56.2 kg)     Objective:    Vital Signs:  BP (!) 150/77   Pulse 64   Temp (!) 97 F (36.1 C)   Ht 4\' 11"  (1.499 m)   Wt 130 lb (59 kg)   SpO2 97%   BMI 26.26 kg/m      ASSESSMENT & PLAN:    1. Essential hypertension   2. Fatigue, unspecified type   3. Pure hypercholesterolemia   4. History of TIA (transient ischemic attack)   5. Restless leg syndrome   6. Leg swelling    Ms. Haizley Leiker is a 86 year old female has remote history of prior TIAs and has been diagnosed as having TIA with old asymptomatic lacunar infarcts.  She has been found to have significant small vessel disease on CT, MRA and MRI imaging.  She has documented normal systolic function with aortic valve sclerosis.  She has been on Plavix for TIA history with no recurrent symptomatology.  On beta-blocker therapy but this was discontinued secondary to bradycardia.  Presently she has started to notice leg swelling and has been given a prescription for HCTZ 25 mg by Dr. Loney Loh which she had taken on 3 occasions.  Rather than take HCTZ 25 mg, I have suggested that she take 12.5 mg daily for the next 3 days and then she can take this on an as-needed basis depending upon leg swelling.  Dates that she is sleeping well at night  but has had issues with frequent sleepiness during the day.  She also is bothered by restless legs and continues to take Requip.  She is concerned of possible short-term memory loss.  I have suggested that when she sees Dr. Shelia Media this can be further addressed with possible consideration for Aricept if needed.  I reviewed her most recent echo Doppler study from January 2020 which showed diastolic dysfunction, grade 2 with normal systolic function.  She has not had recent laboratory and I am recommending in the fasting state that she have chemistry profile CBC lipid studies and TSH levels obtained.  This can be done when she feels more comfortable and after receiving a Covid vaccination in the future.  As long as she is stable, I will see her in 6 months for follow-up evaluation.  COVID-19 Education: The signs and symptoms of COVID-19 were discussed with the patient and how to seek care for testing (follow up with PCP or arrange E-visit).  The importance of social distancing was discussed today.  Time:   Today, I have spent 27 minutes with the patient with telehealth technology discussing the above problems.     Medication Adjustments/Labs and Tests Ordered: Current medicines are reviewed at length with the patient today.  Concerns regarding medicines are outlined above.   Tests Ordered: Orders Placed This Encounter  Procedures  . CBC  . Comprehensive metabolic panel  . TSH  . Lipid panel    Medication Changes: No orders of the defined types were placed in this encounter.   Follow Up:  6 months  Signed, Shelva Majestic, MD  04/11/2019 6:46 PM    [place]

## 2019-04-09 NOTE — Telephone Encounter (Signed)
The patient has been called and made aware of the instructions from the virtual visit today. AVS has been mailed.   TAKE: half a tablet of the Hydrochlorothiazide (12.5 mg) for the next 2-3 days (Jan 6, 7 and 8th) then you may take it as needed for swelling.

## 2019-04-11 DIAGNOSIS — Z1159 Encounter for screening for other viral diseases: Secondary | ICD-10-CM | POA: Diagnosis not present

## 2019-04-15 DIAGNOSIS — Z1159 Encounter for screening for other viral diseases: Secondary | ICD-10-CM | POA: Diagnosis not present

## 2019-04-18 DIAGNOSIS — B20 Human immunodeficiency virus [HIV] disease: Secondary | ICD-10-CM | POA: Diagnosis not present

## 2019-04-18 DIAGNOSIS — Z1159 Encounter for screening for other viral diseases: Secondary | ICD-10-CM | POA: Diagnosis not present

## 2019-04-22 DIAGNOSIS — Z1159 Encounter for screening for other viral diseases: Secondary | ICD-10-CM | POA: Diagnosis not present

## 2019-04-24 ENCOUNTER — Other Ambulatory Visit: Payer: Self-pay | Admitting: Cardiovascular Disease

## 2019-04-29 DIAGNOSIS — Z1159 Encounter for screening for other viral diseases: Secondary | ICD-10-CM | POA: Diagnosis not present

## 2019-05-06 DIAGNOSIS — Z1159 Encounter for screening for other viral diseases: Secondary | ICD-10-CM | POA: Diagnosis not present

## 2019-05-13 DIAGNOSIS — Z1159 Encounter for screening for other viral diseases: Secondary | ICD-10-CM | POA: Diagnosis not present

## 2019-05-20 DIAGNOSIS — Z1159 Encounter for screening for other viral diseases: Secondary | ICD-10-CM | POA: Diagnosis not present

## 2019-05-24 ENCOUNTER — Other Ambulatory Visit: Payer: Self-pay | Admitting: Cardiovascular Disease

## 2019-05-27 DIAGNOSIS — Z1159 Encounter for screening for other viral diseases: Secondary | ICD-10-CM | POA: Diagnosis not present

## 2019-06-03 DIAGNOSIS — Z1159 Encounter for screening for other viral diseases: Secondary | ICD-10-CM | POA: Diagnosis not present

## 2019-06-10 DIAGNOSIS — R413 Other amnesia: Secondary | ICD-10-CM | POA: Diagnosis not present

## 2019-06-10 DIAGNOSIS — E039 Hypothyroidism, unspecified: Secondary | ICD-10-CM | POA: Diagnosis not present

## 2019-06-10 DIAGNOSIS — E7800 Pure hypercholesterolemia, unspecified: Secondary | ICD-10-CM | POA: Diagnosis not present

## 2019-06-10 DIAGNOSIS — Z1159 Encounter for screening for other viral diseases: Secondary | ICD-10-CM | POA: Diagnosis not present

## 2019-06-10 DIAGNOSIS — I10 Essential (primary) hypertension: Secondary | ICD-10-CM | POA: Diagnosis not present

## 2019-06-10 DIAGNOSIS — R400 Somnolence: Secondary | ICD-10-CM | POA: Diagnosis not present

## 2019-06-17 DIAGNOSIS — Z1159 Encounter for screening for other viral diseases: Secondary | ICD-10-CM | POA: Diagnosis not present

## 2019-06-20 ENCOUNTER — Telehealth: Payer: Self-pay | Admitting: Cardiovascular Disease

## 2019-06-20 NOTE — Telephone Encounter (Signed)
Spoke to patient she was calling to verify her appointment.Stated she had wrote down 3/23 but had marked through it.Advised she has a appointment with Almyra Deforest PA 4/9 at 9:45 am.

## 2019-06-20 NOTE — Telephone Encounter (Signed)
New Message  Patient states that she has wrote down on her calender that she was supposed to have an appointment on 06/25/19 at 1:30pm with the Pharmacist. Informed patient that no appointment with the pharmacist was scheduled. Patient would like to discuss if she needs an appt with a pharmacist. Please give patient a call back.

## 2019-06-24 DIAGNOSIS — Z1159 Encounter for screening for other viral diseases: Secondary | ICD-10-CM | POA: Diagnosis not present

## 2019-06-25 DIAGNOSIS — R682 Dry mouth, unspecified: Secondary | ICD-10-CM | POA: Diagnosis not present

## 2019-06-25 DIAGNOSIS — R400 Somnolence: Secondary | ICD-10-CM | POA: Diagnosis not present

## 2019-07-01 DIAGNOSIS — Z1159 Encounter for screening for other viral diseases: Secondary | ICD-10-CM | POA: Diagnosis not present

## 2019-07-12 ENCOUNTER — Encounter: Payer: Self-pay | Admitting: Physician Assistant

## 2019-07-12 ENCOUNTER — Ambulatory Visit: Payer: Medicare Other | Admitting: Physician Assistant

## 2019-07-12 ENCOUNTER — Other Ambulatory Visit: Payer: Self-pay

## 2019-07-12 VITALS — BP 100/52 | HR 66 | Temp 97.7°F | Ht 59.0 in | Wt 141.0 lb

## 2019-07-12 DIAGNOSIS — Z8673 Personal history of transient ischemic attack (TIA), and cerebral infarction without residual deficits: Secondary | ICD-10-CM

## 2019-07-12 DIAGNOSIS — R5383 Other fatigue: Secondary | ICD-10-CM

## 2019-07-12 DIAGNOSIS — I10 Essential (primary) hypertension: Secondary | ICD-10-CM

## 2019-07-12 NOTE — Progress Notes (Signed)
Cardiology Office Note:    Date:  07/14/2019   ID:  Valerie Barker, DOB 11-08-1922, MRN [id number]  PCP:  Deland Pretty, MD  Cardiologist:  Shelva Majestic, MD  Electrophysiologist:  None   Referring MD: Deland Pretty, MD   Chief Complaint  Patient presents with  . Follow-up    seen for Dr. Claiborne Billings.     History of Present Illness:    Valerie Barker is a 86 y.o. female with a hx of HTN, IBS and h/o CVA.  Myoview in 2012 was normal.  Echocardiogram showed mild to moderate TR, trace MR, mild aortic sclerosis without stenosis, normal EF.  Carotid study in 2011 was normal.  She had a TIA in October 2014, symptoms spontaneously resolved.  MRI of the brain did not show any acute intracranial abnormality, however does show advanced atrophy and chronic microvascular ischemic changes.  Echocardiogram obtained in January [id number] showed a diastolic dysfunction, grade 2 DD, normal EF.  Beta-blocker therapy was discontinued secondary to bradycardia.  Previously, she was given HCTZ 25 mg for leg swelling, Dr. Claiborne Billings recommended change this to as needed dose given her advanced age.  Patient presents today for cardiology office visit.  She currently lives in [place] [facility].  She denies any recent chest pain however does admit to have shortness of breath with exertion.  Her short-term memory is not good however her long-term memory is fine.  She did receive her 2 doses of MODERNA COVID vaccine.  She is no longer taking losartan, instead she is on the olmesartan 40 mg daily.  Instead of taking hydrochlorothiazide as needed, she is actually taking 25 mg daily.  On presentation, she complained of increasing fatigue and dry mouth.  I recommended a CBC, basic metabolic panel and a TSH.  I suspect she is somewhat dehydrated.  Based on the lab work, I likely will change hydrochlorothiazide to as needed again.  She mentions she does occasionally have some leg swelling.  However on exam I do not see  any leg swelling at all.  I suspect the leg swelling she mentioned is dependent edema due to venous stasis.  I recommended leg elevation as conservative management.  Given her age, she would be at high risk for dehydration.  Otherwise she can follow-up with Dr. Claiborne Billings in 4 to 5 months.  Past Medical History:  Diagnosis Date  . Heart disease   . Hypertension   . IBS (irritable bowel syndrome)   . Stroke ([place]) 04/17/2018   around 2011  . TIA (transient ischemic attack)     Past Surgical History:  Procedure Laterality Date  . ABDOMINAL HYSTERECTOMY  1978  . BUNIONECTOMY  2006, 1996  . LAPAROSCOPIC SALPINGO OOPHERECTOMY  1988    Current Medications: Current Meds  Medication Sig  . aspirin EC 81 MG EC tablet Take 1 tablet (81 mg total) by mouth daily.  Marland Kitchen atorvastatin (LIPITOR) 10 MG tablet Take 0.5 tablets (5 mg total) by mouth daily.  . Biotin 10000 MCG TABS Take 10,000 mcg by mouth every evening.   . Calcium Carbonate-Vitamin D (CALCIUM-D PO) Take 1 tablet by mouth every evening. 1200/500  . Cholecalciferol (VITAMIN D) 2000 UNITS CAPS Take 2,000 Units by mouth every morning.   . clopidogrel (PLAVIX) 75 MG tablet Take 75 mg by mouth daily.  . Cyanocobalamin (VITAMIN B-12 PO) Take 2,500 mcg by mouth every morning.   . DULoxetine (CYMBALTA) 20 MG capsule 20 mg daily.  Marland Kitchen guaiFENesin (MUCINEX PO)  Take 1,200 mg by mouth as needed.  . hydrochlorothiazide (HYDRODIURIL) 25 MG tablet Take 12.5 mg by mouth daily. As needed for swelling  . levothyroxine (SYNTHROID) 25 MCG tablet Take 25 mcg by mouth daily.  . Multiple Vitamin (MULTIVITAMIN) capsule Take 1 capsule by mouth every morning.   Marland Kitchen OLMESARTAN MEDOXOMIL PO Take 40 mg by mouth daily.  Marland Kitchen rOPINIRole (REQUIP) 1 MG tablet 1 mg. Takes 1 at evening, 2 at bedtime  . senna-docusate (SENOKOT-S) 8.6-50 MG tablet Take 1 tablet by mouth at bedtime as needed for mild constipation.     Allergies:   Duloxetine, Erythromycin, Mirtazapine, Trazodone,  Zolpidem, Cyclobenzaprine, and Diazepam   Social History   Socioeconomic History  . Marital status: Married    Spouse name: Not on file  . Number of children: Not on file  . Years of education: Not on file  . Highest education level: Not on file  Occupational History  . Not on file  Tobacco Use  . Smoking status: Never Smoker  . Smokeless tobacco: Never Used  Substance and Sexual Activity  . Alcohol use: No  . Drug use: No  . Sexual activity: Not on file  Other Topics Concern  . Not on file  Social History Narrative   Lives in [place] at [place].  Education BA degree.  Retired.  3 children.  Son, [place].  Caffeine 1=2 cups daily.   Social Determinants of Health   Financial Resource Strain:   . Difficulty of Paying Living Expenses:   Food Insecurity:   . Worried About Charity fundraiser in the Last Year:   . Arboriculturist in the Last Year:   Transportation Needs:   . Film/video editor (Medical):   Marland Kitchen Lack of Transportation (Non-Medical):   Physical Activity:   . Days of Exercise per Week:   . Minutes of Exercise per Session:   Stress:   . Feeling of Stress :   Social Connections:   . Frequency of Communication with Friends and Family:   . Frequency of Social Gatherings with Friends and Family:   . Attends Religious Services:   . Active Member of Clubs or Organizations:   . Attends Archivist Meetings:   Marland Kitchen Marital Status:      Family History: The patient's family history includes COPD in her mother; Schizophrenia in her brother; Stroke in her father. There is no history of Breast cancer.  ROS:   Please see the history of present illness.     All other systems reviewed and are negative.  EKGs/Labs/Other Studies Reviewed:    The following studies were reviewed today:  Echo 04/18/2018 LV EF: 60% -  65%   -------------------------------------------------------------------  Indications:   CVA 436.    -------------------------------------------------------------------  History:  Risk factors: Hypertension.   -------------------------------------------------------------------  Study Conclusions   - Left ventricle: The cavity size was normal. Wall thickness was  normal. Systolic function was normal. The estimated ejection  fraction was in the range of 60% to 65%. Wall motion was normal;  there were no regional wall motion abnormalities. Features are  consistent with a pseudonormal left ventricular filling pattern,  with concomitant abnormal relaxation and increased filling  pressure (grade 2 diastolic dysfunction).   EKG:  EKG is ordered today.  The ekg ordered today demonstrates sinus bradycardia, poor R wave progression in the anterior leads  Recent Labs: 07/12/2019: BUN 23; Creatinine, Ser 0.97; Hemoglobin 12.7; Platelets 190; Potassium 4.6; Sodium 140; TSH  5.800  Recent Lipid Panel    Component Value Date/Time   CHOL 128 04/18/2018 0458   TRIG 67 04/18/2018 0458   HDL 67 04/18/2018 0458   CHOLHDL 1.9 04/18/2018 0458   VLDL 13 04/18/2018 0458   LDLCALC 48 04/18/2018 0458    Physical Exam:    VS:  BP (!) 100/52   Pulse 66   Temp 97.7 F (36.5 C)   Ht 4\' 11"  (1.499 m)   Wt 141 lb (64 kg)   SpO2 96%   BMI 28.48 kg/m     Wt Readings from Last 3 Encounters:  07/12/19 141 lb (64 kg)  04/09/19 130 lb (59 kg)  06/05/18 123 lb 3.2 oz (55.9 kg)     GEN:  Well nourished, well developed in no acute distress HEENT: Normal NECK: No JVD; No carotid bruits LYMPHATICS: No lymphadenopathy CARDIAC: RRR, no murmurs, rubs, gallops RESPIRATORY:  Clear to auscultation without rales, wheezing or rhonchi  ABDOMEN: Soft, non-tender, non-distended MUSCULOSKELETAL:  No edema; No deformity  SKIN: Warm and dry NEUROLOGIC:  Alert and oriented x 3 PSYCHIATRIC:  Normal affect   ASSESSMENT:    1. Fatigue, unspecified type   2. Essential hypertension   3. H/O: CVA  (cerebrovascular accident)    PLAN:    In order of problems listed above:  1. Fatigue: Obtain CBC, TSH and basic metabolic panel. I likely will change her hydrochlorothiazide to as needed due to concern of dehydration given her advanced age  27. Hypertension: Blood pressure stable  3. History of CVA: No recent recurrence   Medication Adjustments/Labs and Tests Ordered: Current medicines are reviewed at length with the patient today.  Concerns regarding medicines are outlined above.  Orders Placed This Encounter  Procedures  . Basic metabolic panel  . CBC  . TSH  . EKG 12-Lead   No orders of the defined types were placed in this encounter.   Patient Instructions  Medication Instructions:  Your physician recommends that you continue on your current medications as directed. Please refer to the Current Medication list given to you today.  *If you need a refill on your cardiac medications before your next appointment, please call your pharmacy*   Lab Work: Your physician recommends that you return for lab work TODAY:   BMET  CBC  TSH  If you have labs (blood work) drawn today and your tests are completely normal, you will receive your results only by: Marland Kitchen MyChart Message (if you have MyChart) OR . A paper copy in the mail If you have any lab test that is abnormal or we need to change your treatment, we will call you to review the results.  Testing/Procedures: NONE ordered at this time of appointment   Follow-Up: At [facility], you and your health needs are our priority.  As part of our continuing mission to provide you with exceptional heart care, we have created designated Provider Care Teams.  These Care Teams include your primary Cardiologist (physician) and Advanced Practice Providers (APPs -  Physician Assistants and Nurse Practitioners) who all work together to provide you with the care you need, when you need it.   Your next appointment:   4-5 month(s)  The  format for your next appointment:   In Person  Provider:   Shelva Majestic, MD  Other Instructions      Signed, Almyra Deforest, [place]  07/14/2019 11:10 PM    [place]

## 2019-07-12 NOTE — Patient Instructions (Addendum)
Medication Instructions:  Your physician recommends that you continue on your current medications as directed. Please refer to the Current Medication list given to you today.  *If you need a refill on your cardiac medications before your next appointment, please call your pharmacy*   Lab Work: Your physician recommends that you return for lab work TODAY:   BMET  CBC  TSH  If you have labs (blood work) drawn today and your tests are completely normal, you will receive your results only by: Marland Kitchen MyChart Message (if you have MyChart) OR . A paper copy in the mail If you have any lab test that is abnormal or we need to change your treatment, we will call you to review the results.  Testing/Procedures: NONE ordered at this time of appointment   Follow-Up: At [facility], you and your health needs are our priority.  As part of our continuing mission to provide you with exceptional heart care, we have created designated Provider Care Teams.  These Care Teams include your primary Cardiologist (physician) and Advanced Practice Providers (APPs -  Physician Assistants and Nurse Practitioners) who all work together to provide you with the care you need, when you need it.   Your next appointment:   4-5 month(s)  The format for your next appointment:   In Person  Provider:   Shelva Majestic, MD  Other Instructions

## 2019-07-13 LAB — BASIC METABOLIC PANEL
BUN/Creatinine Ratio: 24 (ref 12–28)
BUN: 23 mg/dL (ref 10–36)
CO2: 25 mmol/L (ref 20–29)
Calcium: 9.2 mg/dL (ref 8.7–10.3)
Chloride: 102 mmol/L (ref 96–106)
Creatinine, Ser: 0.97 mg/dL (ref 0.57–1.00)
GFR calc Af Amer: 58 mL/min/{1.73_m2} — ABNORMAL LOW (ref >59)
GFR calc non Af Amer: 50 mL/min/{1.73_m2} — ABNORMAL LOW (ref >59)
Glucose: 82 mg/dL (ref 65–99)
Potassium: 4.6 mmol/L (ref 3.5–5.2)
Sodium: 140 mmol/L (ref 134–144)

## 2019-07-13 LAB — CBC
Hematocrit: 38.2 % (ref 34.0–46.6)
Hemoglobin: 12.7 g/dL (ref 11.1–15.9)
MCH: 30.2 pg (ref 26.6–33.0)
MCHC: 33.2 g/dL (ref 31.5–35.7)
MCV: 91 fL (ref 79–97)
Platelets: 190 10*3/uL (ref 150–450)
RBC: 4.2 x10E6/uL (ref 3.77–5.28)
RDW: 12.7 % (ref 11.7–15.4)
WBC: 7.2 10*3/uL (ref 3.4–10.8)

## 2019-07-13 LAB — TSH: TSH: 5.8 u[IU]/mL — ABNORMAL HIGH (ref 0.450–4.500)

## 2019-07-14 ENCOUNTER — Encounter: Payer: Self-pay | Admitting: Physician Assistant

## 2019-07-15 DIAGNOSIS — Z1159 Encounter for screening for other viral diseases: Secondary | ICD-10-CM | POA: Diagnosis not present

## 2019-07-15 DIAGNOSIS — H52203 Unspecified astigmatism, bilateral: Secondary | ICD-10-CM | POA: Diagnosis not present

## 2019-07-15 DIAGNOSIS — H04123 Dry eye syndrome of bilateral lacrimal glands: Secondary | ICD-10-CM | POA: Diagnosis not present

## 2019-07-15 DIAGNOSIS — Z961 Presence of intraocular lens: Secondary | ICD-10-CM | POA: Diagnosis not present

## 2019-07-15 NOTE — Progress Notes (Signed)
Stable renal function and electrolyte, given her age, recommend change HCTZ to as needed

## 2019-07-15 NOTE — Progress Notes (Signed)
Note, although her med list says she is to take HCTZ as needed however patient told me last Friday she has been taking daily. Now we will change it back to as needed. CBC normal, but note TSH was high, this could mean that she has not been taking enough levothyroxine which may explain her recent fatigue. Recommend forward lab to Dr. Shelia Media to manage her levothyroxine dose

## 2019-07-16 DIAGNOSIS — E538 Deficiency of other specified B group vitamins: Secondary | ICD-10-CM | POA: Diagnosis not present

## 2019-07-16 DIAGNOSIS — R682 Dry mouth, unspecified: Secondary | ICD-10-CM | POA: Diagnosis not present

## 2019-07-16 DIAGNOSIS — I10 Essential (primary) hypertension: Secondary | ICD-10-CM | POA: Diagnosis not present

## 2019-07-16 DIAGNOSIS — R400 Somnolence: Secondary | ICD-10-CM | POA: Diagnosis not present

## 2019-07-16 DIAGNOSIS — Z8673 Personal history of transient ischemic attack (TIA), and cerebral infarction without residual deficits: Secondary | ICD-10-CM | POA: Diagnosis not present

## 2019-07-22 DIAGNOSIS — Z1159 Encounter for screening for other viral diseases: Secondary | ICD-10-CM | POA: Diagnosis not present

## 2019-07-29 DIAGNOSIS — Z1159 Encounter for screening for other viral diseases: Secondary | ICD-10-CM | POA: Diagnosis not present

## 2019-08-05 DIAGNOSIS — Z1159 Encounter for screening for other viral diseases: Secondary | ICD-10-CM | POA: Diagnosis not present

## 2019-08-12 DIAGNOSIS — Z1159 Encounter for screening for other viral diseases: Secondary | ICD-10-CM | POA: Diagnosis not present

## 2019-08-14 DIAGNOSIS — I10 Essential (primary) hypertension: Secondary | ICD-10-CM | POA: Diagnosis not present

## 2019-08-14 DIAGNOSIS — E538 Deficiency of other specified B group vitamins: Secondary | ICD-10-CM | POA: Diagnosis not present

## 2019-08-14 DIAGNOSIS — E039 Hypothyroidism, unspecified: Secondary | ICD-10-CM | POA: Diagnosis not present

## 2019-08-14 DIAGNOSIS — Z8673 Personal history of transient ischemic attack (TIA), and cerebral infarction without residual deficits: Secondary | ICD-10-CM | POA: Diagnosis not present

## 2019-08-19 DIAGNOSIS — Z1159 Encounter for screening for other viral diseases: Secondary | ICD-10-CM | POA: Diagnosis not present

## 2019-08-19 DIAGNOSIS — M810 Age-related osteoporosis without current pathological fracture: Secondary | ICD-10-CM | POA: Diagnosis not present

## 2019-08-19 DIAGNOSIS — E7800 Pure hypercholesterolemia, unspecified: Secondary | ICD-10-CM | POA: Diagnosis not present

## 2019-08-19 DIAGNOSIS — R0600 Dyspnea, unspecified: Secondary | ICD-10-CM | POA: Diagnosis not present

## 2019-08-19 DIAGNOSIS — I10 Essential (primary) hypertension: Secondary | ICD-10-CM | POA: Diagnosis not present

## 2019-08-19 DIAGNOSIS — G2581 Restless legs syndrome: Secondary | ICD-10-CM | POA: Diagnosis not present

## 2019-08-19 DIAGNOSIS — Z0000 Encounter for general adult medical examination without abnormal findings: Secondary | ICD-10-CM | POA: Diagnosis not present

## 2019-08-20 ENCOUNTER — Other Ambulatory Visit (HOSPITAL_COMMUNITY): Payer: Self-pay | Admitting: Internal Medicine

## 2019-08-20 DIAGNOSIS — R0600 Dyspnea, unspecified: Secondary | ICD-10-CM | POA: Diagnosis not present

## 2019-08-20 DIAGNOSIS — R0609 Other forms of dyspnea: Secondary | ICD-10-CM

## 2019-08-21 ENCOUNTER — Ambulatory Visit (HOSPITAL_COMMUNITY)
Admission: RE | Admit: 2019-08-21 | Discharge: 2019-08-21 | Disposition: A | Discharge status: Home / Self Care | Payer: Medicare Other | Source: Ambulatory Visit | Attending: Internal Medicine | Admitting: Internal Medicine

## 2019-08-21 ENCOUNTER — Other Ambulatory Visit: Payer: Self-pay

## 2019-08-21 DIAGNOSIS — Z8673 Personal history of transient ischemic attack (TIA), and cerebral infarction without residual deficits: Secondary | ICD-10-CM | POA: Insufficient documentation

## 2019-08-21 DIAGNOSIS — I082 Rheumatic disorders of both aortic and tricuspid valves: Secondary | ICD-10-CM | POA: Diagnosis not present

## 2019-08-21 DIAGNOSIS — I119 Hypertensive heart disease without heart failure: Secondary | ICD-10-CM | POA: Insufficient documentation

## 2019-08-21 DIAGNOSIS — R0600 Dyspnea, unspecified: Secondary | ICD-10-CM

## 2019-08-21 DIAGNOSIS — R0609 Other forms of dyspnea: Secondary | ICD-10-CM | POA: Insufficient documentation

## 2019-08-21 NOTE — Progress Notes (Signed)
  Echocardiogram 2D Echocardiogram has been performed.  Valerie Barker 08/21/2019, 3:55 PM

## 2019-08-26 DIAGNOSIS — Z1159 Encounter for screening for other viral diseases: Secondary | ICD-10-CM | POA: Diagnosis not present

## 2019-09-05 ENCOUNTER — Telehealth: Payer: Self-pay | Admitting: Cardiovascular Disease

## 2019-09-05 NOTE — Telephone Encounter (Signed)
Patient's daughter Manuela Schwartz calling stating patient's PCP had an echo done for the patient 08/21/2019. She states after looking at the test he told her everything was fine and not to worry. Patient's daughter states the patient is still experiencing SOB and swelling in her feet, ankles and lower legs. She is requesting Dr. Claiborne Billings looks at the echo. She states the patient has been having SOB on occasion and believes it may be stress related. She states she believes the patient has gained 20 lbs over the pandemic, but is not sure how much she's gained. She also states the patient was prescribed hydrochlorothiazide but it has not been coming in her pill pack. She states the patient has also been extremely tired and sleeps a lot. She states it is like she is drugged. She states to call her sister Gwinda Passe at [phone], who will be with the patient all day.

## 2019-09-05 NOTE — Telephone Encounter (Signed)
Spoke with patient's daughters. They just wanted to have Dr. Claiborne Billings review the echo that was done recently (08/21/19) and see if Dr. Claiborne Billings agrees with Dr. Pennie Banter interpretation. They would like a follow up call afterwards to hear Dr. Evette Georges recommendations. Message will be sent to MD and primary nurse [facility] for review.

## 2019-09-06 NOTE — Telephone Encounter (Signed)
I reviewed the echo which was interpreted by Dr. Davina Poke.  LV function is normal with EF 60 to 65%.  There is mild increased LVH with mild relaxation impairment.  She has mild increase in pulmonary pressures.  There is evidence for mild aortic sclerosis without stenosis.  The patient's echo looks fairly good.  Mild diastolic dysfunction can contribute to some shortness of breath.  Her pulmonary pressure is only mildly increased.

## 2019-09-09 DIAGNOSIS — G2581 Restless legs syndrome: Secondary | ICD-10-CM | POA: Diagnosis not present

## 2019-09-09 DIAGNOSIS — R400 Somnolence: Secondary | ICD-10-CM | POA: Diagnosis not present

## 2019-09-09 DIAGNOSIS — Z1159 Encounter for screening for other viral diseases: Secondary | ICD-10-CM | POA: Diagnosis not present

## 2019-09-09 DIAGNOSIS — R600 Localized edema: Secondary | ICD-10-CM | POA: Diagnosis not present

## 2019-09-09 DIAGNOSIS — E039 Hypothyroidism, unspecified: Secondary | ICD-10-CM | POA: Diagnosis not present

## 2019-09-09 NOTE — Telephone Encounter (Signed)
Called and reviewed Dr.Kelly's interpretation of pt's echo with pt's daughter (per DPR) daughter verbalized understanding and was thankful for the call. no other questions at this time.

## 2019-09-10 DIAGNOSIS — R600 Localized edema: Secondary | ICD-10-CM | POA: Diagnosis not present

## 2019-09-13 ENCOUNTER — Encounter (INDEPENDENT_AMBULATORY_CARE_PROVIDER_SITE_OTHER): Payer: Self-pay | Admitting: Otolaryngology

## 2019-09-13 ENCOUNTER — Other Ambulatory Visit: Payer: Self-pay

## 2019-09-13 ENCOUNTER — Ambulatory Visit (INDEPENDENT_AMBULATORY_CARE_PROVIDER_SITE_OTHER): Payer: Medicare Other | Admitting: Otolaryngology

## 2019-09-13 VITALS — Temp 97.3°F

## 2019-09-13 DIAGNOSIS — H6121 Impacted cerumen, right ear: Secondary | ICD-10-CM

## 2019-09-13 DIAGNOSIS — H903 Sensorineural hearing loss, bilateral: Secondary | ICD-10-CM | POA: Diagnosis not present

## 2019-09-13 NOTE — Progress Notes (Signed)
HPI: Valerie Barker is a 86 y.o. female who presents for evaluation of hearing.  She has noticed a gradual decline in hearing.  She is having trouble at the community where she is living where she eats with different people and has trouble hearing them.  Also has trouble hearing the TV.  Past Medical History:  Diagnosis Date  . Heart disease   . Hypertension   . IBS (irritable bowel syndrome)   . Stroke ([place]) 04/17/2018   around 2011  . TIA (transient ischemic attack)    Past Surgical History:  Procedure Laterality Date  . ABDOMINAL HYSTERECTOMY  1978  . BUNIONECTOMY  2006, 1996  . LAPAROSCOPIC SALPINGO OOPHERECTOMY  1988   Social History   Socioeconomic History  . Marital status: Married    Spouse name: Not on file  . Number of children: Not on file  . Years of education: Not on file  . Highest education level: Not on file  Occupational History  . Not on file  Tobacco Use  . Smoking status: Never Smoker  . Smokeless tobacco: Never Used  Vaping Use  . Vaping Use: Never used  Substance and Sexual Activity  . Alcohol use: No  . Drug use: No  . Sexual activity: Not on file  Other Topics Concern  . Not on file  Social History Narrative   Lives in [place] at [place].  Education BA degree.  Retired.  3 children.  Son, [place].  Caffeine 1=2 cups daily.   Social Determinants of Health   Financial Resource Strain:   . Difficulty of Paying Living Expenses:   Food Insecurity:   . Worried About Charity fundraiser in the Last Year:   . Arboriculturist in the Last Year:   Transportation Needs:   . Film/video editor (Medical):   Marland Kitchen Lack of Transportation (Non-Medical):   Physical Activity:   . Days of Exercise per Week:   . Minutes of Exercise per Session:   Stress:   . Feeling of Stress :   Social Connections:   . Frequency of Communication with Friends and Family:   . Frequency of Social Gatherings with Friends and Family:   . Attends Religious  Services:   . Active Member of Clubs or Organizations:   . Attends Archivist Meetings:   Marland Kitchen Marital Status:    Family History  Problem Relation Age of Onset  . COPD Mother   . Schizophrenia Brother   . Stroke Father   . Breast cancer Neg Hx    Allergies  Allergen Reactions  . Duloxetine Other (See Comments)    unknown  . Erythromycin   . Mirtazapine     Other reaction(s): Other (See Comments) unknown  . Trazodone     Other reaction(s): Other (See Comments) unknown  . Zolpidem     Other reaction(s): Other (See Comments) unknown  . Cyclobenzaprine Anxiety    Unknown  . Diazepam Rash    unknown   Prior to Admission medications   Medication Sig Start Date End Date Taking? Authorizing Provider  aspirin EC 81 MG EC tablet Take 1 tablet (81 mg total) by mouth daily. 04/20/18  Yes Sheikh, Omair Latif, DO  atorvastatin (LIPITOR) 10 MG tablet Take 0.5 tablets (5 mg total) by mouth daily. 05/24/19  Yes Troy Sine, MD  Biotin 10000 MCG TABS Take 10,000 mcg by mouth every evening.    Yes [provider]  Calcium Carbonate-Vitamin D (  CALCIUM-D PO) Take 1 tablet by mouth every evening. 1200/500   Yes [provider]  Cholecalciferol (VITAMIN D) 2000 UNITS CAPS Take 2,000 Units by mouth every morning.    Yes [provider]  clopidogrel (PLAVIX) 75 MG tablet Take 75 mg by mouth daily.   Yes [provider]  Cyanocobalamin (VITAMIN B-12 PO) Take 2,500 mcg by mouth every morning.    Yes [provider]  DULoxetine (CYMBALTA) 20 MG capsule 20 mg daily. 04/30/18  Yes [provider]  guaiFENesin (MUCINEX PO) Take 1,200 mg by mouth as needed.   Yes [provider]  hydrochlorothiazide (HYDRODIURIL) 25 MG tablet Take 12.5 mg by mouth daily. As needed for swelling   Yes [provider]  levothyroxine (SYNTHROID) 25 MCG tablet Take 25 mcg by mouth daily. 04/01/19  Yes [provider]  Multiple Vitamin  (MULTIVITAMIN) capsule Take 1 capsule by mouth every morning.    Yes [provider]  OLMESARTAN MEDOXOMIL PO Take 40 mg by mouth daily.   Yes [provider]  rOPINIRole (REQUIP) 1 MG tablet 1 mg. Takes 1 at evening, 2 at bedtime 05/26/18  Yes [provider]  senna-docusate (SENOKOT-S) 8.6-50 MG tablet Take 1 tablet by mouth at bedtime as needed for mild constipation. 04/19/18  Yes Sheikh, Omair Latif, DO     Positive ROS: Otherwise negative  All other systems have been reviewed and were otherwise negative with the exception of those mentioned in the HPI and as above.  Physical Exam: Constitutional: Alert, well-appearing, no acute distress Ears: External ears without lesions or tenderness.  Right TM with a large amount of wax that was removed with forceps and curettes.  The right TM is clear.  Left TM with minimal wax with a clear left TM. Nasal: External nose without lesions. Clear nasal passages Oral: Lips and gums without lesions. Tongue and palate mucosa without lesions. Posterior oropharynx clear. Neck: No palpable adenopathy or masses Respiratory: Breathing comfortably  Skin: No facial/neck lesions or rash noted.  Audiogram in the office today demonstrated moderate bilateral sensorineural hearing loss which was symmetric in both ears.  SRT's were 40 dB on the right and 45 dB on the left.  She had type A tympanograms bilaterally.  Cerumen impaction removal  Date/Time: 09/13/2019 3:41 PM Performed by: Rozetta Nunnery, MD Authorized by: Rozetta Nunnery, MD   Consent:    Consent obtained:  Verbal   Consent given by:  Patient   Risks discussed:  Pain and bleeding Procedure details:    Location:  R ear   Procedure type: curette and forceps   Post-procedure details:    Inspection:  TM intact and canal normal   Hearing quality:  Improved   Patient tolerance of procedure:  Tolerated well, no immediate complications Comments:     TMs are clear  bilaterally.    Assessment: Wax buildup on the right side Bilateral sensorineural hearing loss with hearing of 40 and 45 dB  Plan: Recommended obtaining hearing aids and referred her back to hearing life.  Radene Journey, MD

## 2019-09-16 ENCOUNTER — Encounter (INDEPENDENT_AMBULATORY_CARE_PROVIDER_SITE_OTHER): Payer: Self-pay

## 2019-09-16 DIAGNOSIS — Z1159 Encounter for screening for other viral diseases: Secondary | ICD-10-CM | POA: Diagnosis not present

## 2019-09-23 DIAGNOSIS — Z1159 Encounter for screening for other viral diseases: Secondary | ICD-10-CM | POA: Diagnosis not present

## 2019-09-30 ENCOUNTER — Telehealth: Payer: Self-pay | Admitting: Cardiovascular Disease

## 2019-09-30 DIAGNOSIS — Z1159 Encounter for screening for other viral diseases: Secondary | ICD-10-CM | POA: Diagnosis not present

## 2019-09-30 NOTE — Telephone Encounter (Signed)
Spoke with pt, she reports a gradual increase in weight over the last 6 weeks. She reports edema in the right foot, ankle and lower leg. The left is not as bad. She is unable to wear her shoe on the right foot and she also reports the waist band of her pants are tighter than usual. She reports SOB if she tries to increase her walking pace for exercise. She denies orthopnea. For the last week she has taken her HCTZ daily with little change in the swelling. She would like to know what dr Claiborne Billings feels she should do. Will forward to dr Claiborne Billings.

## 2019-09-30 NOTE — Telephone Encounter (Signed)
New  Message  Pt c/o swelling: STAT is pt has developed SOB within 24 hours  1) How much weight have you gained and in what time span?patient states that she has gained 8lbs with the last 2 months  2) If swelling, where is the swelling located? Ankles and feet and up both legs   3) Are you currently taking a fluid pill? yes  4) Are you currently SOB? yes  5) Do you have a log of your daily weights (if so, list)?no   6) Have you gained 3 pounds in a day or 5 pounds in a week? No   7) Have you traveled recently? no

## 2019-10-01 NOTE — Telephone Encounter (Signed)
If she continues to have swelling, she can increase her HCTZ from 12.5 mg to 25 mg as needed

## 2019-10-02 NOTE — Telephone Encounter (Signed)
Spoke with Valerie Barker, aware of dr kelly's recommendations. 

## 2019-10-04 ENCOUNTER — Ambulatory Visit: Payer: Medicare Other | Admitting: Podiatry

## 2019-10-07 DIAGNOSIS — Z1159 Encounter for screening for other viral diseases: Secondary | ICD-10-CM | POA: Diagnosis not present

## 2019-10-09 ENCOUNTER — Other Ambulatory Visit: Payer: Self-pay | Admitting: Neurology

## 2019-10-09 ENCOUNTER — Other Ambulatory Visit: Payer: Self-pay

## 2019-10-09 ENCOUNTER — Encounter: Payer: Self-pay | Admitting: Neurology

## 2019-10-09 ENCOUNTER — Ambulatory Visit: Payer: Medicare Other | Admitting: Neurology

## 2019-10-09 VITALS — BP 119/68 | HR 64 | Ht 59.0 in | Wt 143.0 lb

## 2019-10-09 DIAGNOSIS — G459 Transient cerebral ischemic attack, unspecified: Secondary | ICD-10-CM | POA: Diagnosis not present

## 2019-10-09 DIAGNOSIS — G4719 Other hypersomnia: Secondary | ICD-10-CM

## 2019-10-09 DIAGNOSIS — G2581 Restless legs syndrome: Secondary | ICD-10-CM | POA: Diagnosis not present

## 2019-10-09 DIAGNOSIS — R001 Bradycardia, unspecified: Secondary | ICD-10-CM | POA: Diagnosis not present

## 2019-10-09 NOTE — Progress Notes (Signed)
Provider:  Larey Seat, M D  Referring Provider: Deland Pretty, MD Primary Care Physician:  Deland Pretty, MD  Chief Complaint  Patient presents with  . New Patient (Initial Visit)    rm 10. with daughers. pt referred to [place] to address an unusual increase in fatigue. she has recently since april/may started to fall asleep in middle of conversations/ middle of eating or drinking. she also carries a dg of RLS in which she is treated with requip. the pcp has increased the medication and is at a point where he doesnt feel the dosage is helping her. patient describes some memory concerns as well long term is ok but short term memory is a problem.   . Other    states she fall asleep 1/2 am, sleeps until 11 am and tired throughout the day. 10/11 pm becomes increasing more active.     HPI:  Valerie Barker is a meanwhile  86 y.o. female patient, very articulate , well groomed, and is seen here in the presence of 2 daughters- seen here upon a referral   from Dr. Shelia Media for this established patient at [facility] , who has seen Dr Leta Baptist last March 2020- in person- two visits. I have quoted his notes in essence below.   Patient reports RLS- has been already addressed as has been fatigue. Now she is reportedly falling asleep while drinking a cup of coffee- and she has been describing diplopia, horizontal. " so real that I have to touch an object to know which one is diplopic" she has all her life been a deep sleeper, not moving- now her bedcovers are disturbed and sometimes things end up on the floor.  She reports an irresistible urge to sleep.   UPDATE (06/05/18, VRP): Since last visit, patient returns for TIA follow-up.  Patient had episode of left field cut and left neglect, left facial droop in January 2020.  CT, CTA, MRI were unremarkable.  Echocardiogram, LDL and A1c were unremarkable.  Patient was recommended to use dual antiplatelet aspirin plus Plavix for 3 months and then reduce to plavix  alone.  Patient did not start aspirin however for some reason. Patient was also recommended to have 30-day cardiac monitoring as outpatient to rule out atrial fibrillation --> no atrial fibrillation was found PRIOR HPI (06/23/17): 86 year old female here for evaluation of restless leg syndrome, fatigue, low back pain, headaches Patient has history of restless leg syndrome for many years, managed on gabapentin and ropinirole.  Symptoms were poorly controlled a few months ago but have improved now.  Patient is satisfied with her current medication.  She has history of low B12 level. Patient asking about generalized fatigue over the last 2-3 years.  Patient also has been widowed for last 3 years after her husband with dementia passed away.  She has decreased interest and activity to do on daily basis. Patient also has low back pain rating to the right hip and leg since December 2018.  She has been going through physical therapy with mild relief.  She also uses tramadol and Tylenol with mild relief.  She had MRI of the lumbar spine which showed disc protrusion at L3-4 level towards the right side.  She is continuing to pursue conservative treatments.   Sleep related history reviewed.  social history , widowed- Print production planner, Librarian, academic. 3 adult children.  No tobacco use, no ETOH, 1 cup of coffee in AM ,  caffeine: diet coke also 1 glass every  other day.     Sleep habits: dinner time - 7.30, bedtime - midnight. Sleep time 1 AM- Rise time between 9-10 AM now.  6 Am is one break for the bathroom, takes her thyroid medication. Returns to sleep for 2 more hours.  Sideways, 1 pillow- "my pillow".     Review of Systems: Out of a complete 14 system review, the patient complains of only the following symptoms, and all other reviewed systems are negative. TIA, diplopia, scoliosis, back pain.  Used a walker-   Chronic cough, clearing her through- 2016. she is nocturnal- she is night active. She has  always liked to work at night, pay her bills, write notes , etc. She used to get by with 5 hours. Now she reports sleep attacks, 7 hours of sleep needed. No naps taken until age 81!!  I will evaluate the sleep related concern, otherwise she will follow her primary neurologist.   How likely are you to doze in the following situations: 0 = not likely, 1 = slight chance, 2 = moderate chance, 3 = high chance  Sitting and Reading? Watching Television? Sitting inactive in a public place (theater or meeting)? Lying down in the afternoon when circumstances permit? Sitting and talking to someone? Sitting quietly after lunch without alcohol? In a car, while stopped for a few minutes in traffic? As a passenger in a car for an hour without a break?  Total = 16/ 24 points.     Social History   Socioeconomic History  . Marital status: Married    Spouse name: Not on file  . Number of children: Not on file  . Years of education: Not on file  . Highest education level: Not on file  Occupational History  . Not on file  Tobacco Use  . Smoking status: Never Smoker  . Smokeless tobacco: Never Used  Vaping Use  . Vaping Use: Never used  Substance and Sexual Activity  . Alcohol use: No  . Drug use: No  . Sexual activity: Not on file  Other Topics Concern  . Not on file  Social History Narrative   Lives in [place] at [place].  Education BA degree.  Retired.  3 children.  Son, [place].  Caffeine 1=2 cups daily.   Social Determinants of Health   Financial Resource Strain:   . Difficulty of Paying Living Expenses:   Food Insecurity:   . Worried About Charity fundraiser in the Last Year:   . Arboriculturist in the Last Year:   Transportation Needs:   . Film/video editor (Medical):   Marland Kitchen Lack of Transportation (Non-Medical):   Physical Activity:   . Days of Exercise per Week:   . Minutes of Exercise per Session:   Stress:   . Feeling of Stress :   Social Connections:    . Frequency of Communication with Friends and Family:   . Frequency of Social Gatherings with Friends and Family:   . Attends Religious Services:   . Active Member of Clubs or Organizations:   . Attends Archivist Meetings:   Marland Kitchen Marital Status:   Intimate Partner Violence:   . Fear of Current or Ex-Partner:   . Emotionally Abused:   Marland Kitchen Physically Abused:   . Sexually Abused:     Family History  Problem Relation Age of Onset  . COPD Mother   . Schizophrenia Brother   . Stroke Father   . Breast cancer Neg Hx  Past Medical History:  Diagnosis Date  . Heart disease   . Hypertension   . IBS (irritable bowel syndrome)   . Stroke ([place]) 04/17/2018   around 2011  . TIA (transient ischemic attack)     Past Surgical History:  Procedure Laterality Date  . ABDOMINAL HYSTERECTOMY  1978  . BUNIONECTOMY  2006, 1996  . LAPAROSCOPIC SALPINGO OOPHERECTOMY  1988    Current Outpatient Medications  Medication Sig Dispense Refill  . atorvastatin (LIPITOR) 10 MG tablet Take 0.5 tablets (5 mg total) by mouth daily. 45 tablet 1  . Biotin 10000 MCG TABS Take 10,000 mcg by mouth every evening.     . Calcium Carbonate-Vitamin D (CALCIUM-D PO) Take 1 tablet by mouth every evening. 1200/500    . Cholecalciferol (VITAMIN D) 2000 UNITS CAPS Take 2,000 Units by mouth every morning.     . clopidogrel (PLAVIX) 75 MG tablet Take 75 mg by mouth daily.    . Cyanocobalamin (VITAMIN B-12 PO) Take 2,500 mcg by mouth every morning.     . hydrochlorothiazide (HYDRODIURIL) 25 MG tablet Take 12.5 mg by mouth daily. As needed for swelling    . levothyroxine (SYNTHROID) 25 MCG tablet Take 25 mcg by mouth daily.    . Multiple Vitamin (MULTIVITAMIN) capsule Take 1 capsule by mouth every morning.     Marland Kitchen OLMESARTAN MEDOXOMIL PO Take 40 mg by mouth daily.    Marland Kitchen rOPINIRole (REQUIP) 1 MG tablet 1 mg. Takes 1 at evening, 2 at bedtime     No current facility-administered medications for this visit.     Allergies as of 10/09/2019 - Review Complete 10/09/2019  Allergen Reaction Noted  . Duloxetine Other (See Comments) 08/26/2015  . Erythromycin  07/01/2013  . Mirtazapine  08/26/2015  . Trazodone  08/26/2015  . Zolpidem  08/26/2015  . Cyclobenzaprine Anxiety 08/26/2015  . Diazepam Rash 08/26/2015    Vitals: BP 119/68   Pulse 64   Ht 4\' 11"  (1.499 m)   Wt 143 lb (64.9 kg)   BMI 28.88 kg/m  Last Weight:  Wt Readings from Last 1 Encounters:  10/09/19 143 lb (64.9 kg)   Last Height:   Ht Readings from Last 1 Encounters:  10/09/19 4\' 11"  (1.499 m)    Physical exam:  General: The patient is awake, alert and appears not in acute distress. The patient is well groomed. Head: Normocephalic, atraumatic. Neck is supple. Mallampati 2, neck circumference: 15" scoliosis.  Cardiovascular:  Regular rate and rhythm , without  murmurs or carotid bruit, and without distended neck veins. Respiratory: Lungs are clear to auscultation. Skin:  Without evidence of edema, or rash Trunk: BMI is* 28.elevated and patient  has normal posture.  Neurologic exam : The patient is awake and alert, oriented to place and time.   Memory subjective  described as intact. There is a normal attention span & concentration ability. Speech is fluent without  dysarthria, but there is dysphonia. Mood and affect are appropriate.  Cranial nerves: Pupils are equal and briskly reactive to light. Funduscopic exam without evidence of pallor or edema. Extraocular movements  in vertical and horizontal planes intact and without nystagmus.  Visual fields by finger perimetry are intact. Hearing to finger rub intact.  Facial sensation intact to fine touch. Facial motor strength is symmetric and tongue and uvula move midline. Tongue protrusion into either cheek is normal. Shoulder shrug is normal.   Motor exam:  Normal tone ,muscle bulk and symmetric  strength in all extremities. Sensory:  Fine touch, pinprick and vibration  were normal. Coordination: Finger-to-nose maneuver  normal without evidence of ataxia, dysmetria or tremor. Gait and station: Patient walks with a walker -Deep tendon reflexes: in the upper and lower extremities are symmetric and intact. Babinski maneuver response is downgoing.   Assessment:  After physical and neurologic examination, review of laboratory studies, imaging, neurophysiology testing and pre-existing records, assessment is that of :   Mrs. Kossman reports an excessive amount of daytime sleepiness that is new which.  She used to be somebody who can drive is 5 hours of sleep at night and she would often prefer to be active in the late hours of the day now she goes to bed at about midnight falls asleep at 1 stays in bed until at least 9 even if she is not all the time asleep.  In daytime however she struggles with an irresistible urge to go to sleep.  Her Epworth sleepiness score was endorsed at 16 points which is very high but she is not always fatigued it is more like sleepiness.  The patient has seen a therapist who also stated that it was not depression or anxiety that affected her sleepiness.    The patient has had a history of neurovascular events in the year 2020 and those have affected the left body at the time this weakness numbness and the facial droop.  New for her is a diplopia not new as a history of restless legs that has been present for many years and has been treated with dopaminergic agonists.    Our goal will be to rule out nocturnal hypoxemia, periodic limb movements as a translation of restless legs into sleep and sleep apnea.  Plan:  Treatment plan and additional workup :  Patient would like to start with a HST as screening test.  Attended sleep study with evaluation PLM may follow.   Asencion Partridge Corleen Otwell MD 10/09/2019

## 2019-10-09 NOTE — Patient Instructions (Signed)

## 2019-10-14 DIAGNOSIS — Z1159 Encounter for screening for other viral diseases: Secondary | ICD-10-CM | POA: Diagnosis not present

## 2019-10-21 DIAGNOSIS — Z1159 Encounter for screening for other viral diseases: Secondary | ICD-10-CM | POA: Diagnosis not present

## 2019-10-28 DIAGNOSIS — Z1159 Encounter for screening for other viral diseases: Secondary | ICD-10-CM | POA: Diagnosis not present

## 2019-11-03 ENCOUNTER — Ambulatory Visit (INDEPENDENT_AMBULATORY_CARE_PROVIDER_SITE_OTHER): Payer: Medicare Other | Admitting: Neurology

## 2019-11-03 DIAGNOSIS — G459 Transient cerebral ischemic attack, unspecified: Secondary | ICD-10-CM

## 2019-11-03 DIAGNOSIS — G4710 Hypersomnia, unspecified: Secondary | ICD-10-CM

## 2019-11-03 DIAGNOSIS — R001 Bradycardia, unspecified: Secondary | ICD-10-CM

## 2019-11-03 DIAGNOSIS — G2581 Restless legs syndrome: Secondary | ICD-10-CM

## 2019-11-03 DIAGNOSIS — G4719 Other hypersomnia: Secondary | ICD-10-CM

## 2019-11-04 DIAGNOSIS — Z1159 Encounter for screening for other viral diseases: Secondary | ICD-10-CM | POA: Diagnosis not present

## 2019-11-11 DIAGNOSIS — Z1159 Encounter for screening for other viral diseases: Secondary | ICD-10-CM | POA: Diagnosis not present

## 2019-11-18 DIAGNOSIS — Z1159 Encounter for screening for other viral diseases: Secondary | ICD-10-CM | POA: Diagnosis not present

## 2019-11-20 DIAGNOSIS — G4719 Other hypersomnia: Secondary | ICD-10-CM | POA: Insufficient documentation

## 2019-11-20 NOTE — Procedures (Signed)
PATIENT'S NAME:  Valerie Barker, Valerie Barker DOB:      [birth year]      MR#:    [id number]     DATE OF RECORDING: 11/03/2019 AL REFERRING M.D.:  Deland Pretty MD Study Performed:   Baseline Polysomnogram HISTORY:  This 86 y.o. female patient is seen here in the presence of her 2 daughters- upon a consultation requested by Dr. Shelia Media for this otherwise established patient at [facility] , who has seen Dr Leta Baptist  twice in 2020- in person. I have quoted his notes in essence below. The chief concern is increasing daytime sleepiness, excessive and RLS. Patient reports RLS- has been already addressed as has been fatigue. Now she is reportedly falling asleep while drinking a cup of coffee- and she has been describing diplopia, horizontal. " so real that I have to touch an object to know which one is real". Reportedly, she has all her life been a deep sleeper, not moving- now her bedcovers are disturbed and sometimes things end up on the floor. She reports an irresistible urge to sleep.    UPDATE (06/05/18, VRP): Since last visit, patient returns for TIA follow-up.  Patient had episode of left field cut and left neglect, left facial droop in January 2020.  CT, CTA, MRI were unremarkable.  Echocardiogram, LDL and A1c were unremarkable.  Patient was recommended to use dual antiplatelet aspirin plus Plavix for 3 months and then reduce to plavix alone.  Patient did not start aspirin however for some reason. Patient was also recommended to have 30-day cardiac monitoring as outpatient to rule out atrial fibrillation --> no atrial fibrillation was found PRIOR HPI (06/23/17): 86 year old female here for evaluation of restless leg syndrome, fatigue, low back pain, headaches. Patient has history of restless leg syndrome for many years, managed on gabapentin and ropinirole.  Symptoms were poorly controlled a few months ago but have improved now.  Patient is satisfied with her current medication. She has history of low B12 level. Patient asking about  generalized fatigue over the last 2-3 years.  Patient also has been widowed for last 3 years after her husband with dementia passed away.  She has decreased interest and activity to do on daily basis. Patient also has low back pain rating to the right hip and leg since December 2018.  She has been going through physical therapy and uses tramadol and Tylenol with mild relief.  She had MRI of the lumbar spine which showed disc protrusion at L3-4 level towards the right side.  She is continuing to pursue conservative treatments.  The patient endorsed the Epworth Sleepiness Scale at 16 points.   The patient's weight 143 pounds with a height of 59 (inches), resulting in a BMI of 28.9 kg/m2. The patient's neck circumference measured 15 inches.  CURRENT MEDICATIONS: Lipitor, Plavix, Hydrodiuril, Synthroid, Requip  PROCEDURE:  This is a multichannel digital polysomnogram utilizing the Somnostar 11.2 system.  Electrodes and sensors were applied and monitored per AASM Specifications.   EEG, EOG, Chin and Limb EMG, were sampled at 200 Hz.  ECG, Snore and Nasal Pressure, Thermal Airflow, Respiratory Effort, CPAP Flow and Pressure, Oximetry was sampled at 50 Hz. Digital video and audio were recorded.        BASELINE STUDY: Lights Out was at 21:20 and Lights On at 04:49.  Total recording time (TRT) was 449 minutes, with a total sleep time (TST) of 263.5 minutes.   The patient's sleep latency was 1 minute.  REM latency was 0 minutes.  The sleep efficiency was 58.7 %.     SLEEP ARCHITECTURE: WASO (Wake after sleep onset) was 184.5 minutes.  There were 87.5 minutes in Stage N1, 101 minutes Stage N2, 75 minutes Stage N3 and 0 minutes in Stage REM.  The percentage of Stage N1 was 33.2%, Stage N2 was 38.3%, Stage N3 was 28.5% and Stage R (REM sleep) was 0%.  RESPIRATORY ANALYSIS:  There were a total of 46 respiratory events:  2 obstructive apneas, 7 central apneas and 4 mixed apneas with 33 hypopneas.    The total  APNEA/HYPOPNEA INDEX (AHI) was 10.5/hour - 76 events in NREM. The REM AHI was  0 /hour, versus a non-REM AHI of 10.5. The patient spent 0 minutes of total sleep time in the supine position and 264 minutes in non-supine. The supine AHI was 0.0 versus a non-supine AHI of 10.5/h. Central events and cyclic breathing were present.   OXYGEN SATURATION & C02:  The Wake baseline 02 saturation was 94%, with the lowest being 81%. Time spent below 89% saturation equaled 8 minutes. The arousals were noted as: 121 were spontaneous, 0 were associated with PLMs, 26 were associated with respiratory events. The Periodic Limb Movement (PLM) index was 0 and the PLM Arousal index was 0/hour. Audio and video analysis did show extreme sleep fragmentation.  Mild Snoring was noted. EKG was irregular. Atrial fibrillation? Heart block?  IMPRESSION: Severely fragmented sleep and a very short sleep latency.  1. Mild but complex sleep apnea.  2. NO evidence of Periodic Limb Movement Disorder (PLMD) 3. Cyclic Sleep Apnea  4. abnormal EKG, PACs and PVCs.   RECOMMENDATIONS: Dear Dr. Shelia Media, The cyclic apnea seems to correlate with irregular heart rate and rhythm. Cardiology/ PCP follow up needed. There is minimal oxygen desaturation associated.  PLMs were not present- the patient woke up spontaneously. I recommend a trial of KLONOPIN, 0.25 mg at night po, 30 days, to reduce nocturnal sleep fragmentation.    I certify that I have reviewed the entire raw data recording prior to the issuance of this report in accordance with the Standards of Accreditation of the American Academy of Sleep Medicine (AASM)      Larey Seat, MD Diplomat, American Board of Psychiatry and Neurology  Diplomat, American Board of Sleep Medicine Market researcher, [place] [facility] [employer]

## 2019-11-20 NOTE — Progress Notes (Signed)
IMPRESSION: Severely fragmented sleep and a very short sleep latency.  1. Mild but complex sleep apnea.  2. NO evidence of Periodic Limb Movement Disorder (PLMD) 3. Cyclic Sleep Apnea  4. abnormal EKG, PACs and PVCs.   RECOMMENDATIONS: Dear Dr. Shelia Media, The cyclic apnea seems to correlate with irregular heart rate and rhythm. Cardiology/ PCP follow up needed. There is minimal oxygen desaturation associated.  PLMs were not present (!)- the patient woke up spontaneously.  I recommend a trial of KLONOPIN, 0.25 mg at night po, 30 days, to reduce nocturnal sleep fragmentation. I am happy to follow up on your patient if you desire.

## 2019-11-21 ENCOUNTER — Telehealth: Payer: Self-pay | Admitting: Neurology

## 2019-11-21 DIAGNOSIS — H532 Diplopia: Secondary | ICD-10-CM | POA: Diagnosis not present

## 2019-11-21 NOTE — Telephone Encounter (Signed)
Called the daughter and discussed with her sleep study results. Advised that there was mild complex sleep apnea present. Informed there was several arousals during the night. There was some abnormal EKG findings during the sleep study and she would encourage the patient follow up with cardiology/pcp I regards to this. I have attached screen shot to the patient sleep study and placed in mail to daughter. Advised that we could also send the report to Dr Shelia Media and Dr Georgina Peer. Pt verbalized understanding. Pt had no questions at this time but was encouraged to call back if questions arise.

## 2019-11-21 NOTE — Telephone Encounter (Signed)
-----   Message from Larey Seat, MD sent at 11/20/2019 12:09 PM EDT ----- IMPRESSION: Severely fragmented sleep and a very short sleep latency.  1. Mild but complex sleep apnea.  2. NO evidence of Periodic Limb Movement Disorder (PLMD) 3. Cyclic Sleep Apnea  4. abnormal EKG, PACs and PVCs.   RECOMMENDATIONS: Dear Dr. Shelia Media, The cyclic apnea seems to correlate with irregular heart rate and rhythm. Cardiology/ PCP follow up needed. There is minimal oxygen desaturation associated.  PLMs were not present (!)- the patient woke up spontaneously.  I recommend a trial of KLONOPIN, 0.25 mg at night po, 30 days, to reduce nocturnal sleep fragmentation. I am happy to follow up on your patient if you desire.

## 2019-11-24 ENCOUNTER — Other Ambulatory Visit: Payer: Self-pay | Admitting: Cardiovascular Disease

## 2019-11-28 ENCOUNTER — Emergency Department (HOSPITAL_COMMUNITY)
Admission: EM | Admit: 2019-11-28 | Discharge: 2019-11-28 | Disposition: A | Discharge status: Home / Self Care | Payer: Medicare Other | Attending: Emergency Medicine | Admitting: Emergency Medicine

## 2019-11-28 ENCOUNTER — Emergency Department (HOSPITAL_COMMUNITY): Payer: Medicare Other

## 2019-11-28 ENCOUNTER — Encounter (HOSPITAL_COMMUNITY): Payer: Self-pay | Admitting: Emergency Medicine

## 2019-11-28 ENCOUNTER — Other Ambulatory Visit: Payer: Self-pay

## 2019-11-28 DIAGNOSIS — I10 Essential (primary) hypertension: Secondary | ICD-10-CM | POA: Insufficient documentation

## 2019-11-28 DIAGNOSIS — Y939 Activity, unspecified: Secondary | ICD-10-CM | POA: Diagnosis not present

## 2019-11-28 DIAGNOSIS — W010XXA Fall on same level from slipping, tripping and stumbling without subsequent striking against object, initial encounter: Secondary | ICD-10-CM | POA: Insufficient documentation

## 2019-11-28 DIAGNOSIS — S63114A Dislocation of metacarpophalangeal joint of right thumb, initial encounter: Secondary | ICD-10-CM | POA: Diagnosis not present

## 2019-11-28 DIAGNOSIS — Y999 Unspecified external cause status: Secondary | ICD-10-CM | POA: Diagnosis not present

## 2019-11-28 DIAGNOSIS — W19XXXA Unspecified fall, initial encounter: Secondary | ICD-10-CM

## 2019-11-28 DIAGNOSIS — Y929 Unspecified place or not applicable: Secondary | ICD-10-CM | POA: Diagnosis not present

## 2019-11-28 DIAGNOSIS — S62521A Displaced fracture of distal phalanx of right thumb, initial encounter for closed fracture: Secondary | ICD-10-CM | POA: Diagnosis not present

## 2019-11-28 DIAGNOSIS — S62511A Displaced fracture of proximal phalanx of right thumb, initial encounter for closed fracture: Secondary | ICD-10-CM | POA: Diagnosis not present

## 2019-11-28 DIAGNOSIS — G459 Transient cerebral ischemic attack, unspecified: Secondary | ICD-10-CM | POA: Diagnosis not present

## 2019-11-28 DIAGNOSIS — Z79899 Other long term (current) drug therapy: Secondary | ICD-10-CM | POA: Diagnosis not present

## 2019-11-28 DIAGNOSIS — Y92009 Unspecified place in unspecified non-institutional (private) residence as the place of occurrence of the external cause: Secondary | ICD-10-CM

## 2019-11-28 DIAGNOSIS — S62231A Other displaced fracture of base of first metacarpal bone, right hand, initial encounter for closed fracture: Secondary | ICD-10-CM | POA: Diagnosis not present

## 2019-11-28 DIAGNOSIS — S60931A Unspecified superficial injury of right thumb, initial encounter: Secondary | ICD-10-CM | POA: Diagnosis present

## 2019-11-28 MED ORDER — FENTANYL CITRATE (PF) 100 MCG/2ML IJ SOLN
75.0000 ug | Freq: Once | INTRAMUSCULAR | Status: AC
  Administered 2019-11-28: 75 ug via INTRAVENOUS
  Filled 2019-11-28: qty 2

## 2019-11-28 MED ORDER — HYDROCODONE-ACETAMINOPHEN 5-325 MG PO TABS: 0.5000 | ORAL_TABLET | Freq: Three times a day (TID) | ORAL | 0 refills | Status: AC | PRN

## 2019-11-28 MED ORDER — FENTANYL CITRATE (PF) 100 MCG/2ML IJ SOLN
INTRAMUSCULAR | Status: AC
  Administered 2019-11-28: 50 ug via INTRAVENOUS
  Filled 2019-11-28: qty 2

## 2019-11-28 MED ORDER — ACETAMINOPHEN 500 MG PO TABS
1000.0000 mg | ORAL_TABLET | Freq: Once | ORAL | Status: AC
  Administered 2019-11-28: 1000 mg via ORAL
  Filled 2019-11-28: qty 2

## 2019-11-28 MED ORDER — ACETAMINOPHEN 500 MG PO TABS: 1000.0000 mg | ORAL_TABLET | Freq: Three times a day (TID) | ORAL | 0 refills | Status: AC

## 2019-11-28 MED ORDER — LIDOCAINE HCL 2 % IJ SOLN
10.0000 mL | Freq: Once | INTRAMUSCULAR | Status: DC
  Filled 2019-11-28: qty 20

## 2019-11-28 MED ORDER — FENTANYL CITRATE (PF) 100 MCG/2ML IJ SOLN: 50.0000 ug | Freq: Once | INTRAMUSCULAR | Status: AC

## 2019-11-28 NOTE — ED Triage Notes (Signed)
Pt reports mechanical fall tonight. Denies hitting her head, deformity to right thumb.

## 2019-11-28 NOTE — Progress Notes (Signed)
Orthopedic Tech Progress Note Patient Details:  Valerie Barker 1922-08-26 [id number]  Ortho Devices Type of Ortho Device: Thumb spica splint Splint Material: Fiberglass Ortho Device/Splint Location: RUE Ortho Device/Splint Interventions: Application   Post Interventions Patient Tolerated: Well   Linus Salmons [name] 11/28/2019, 3:49 AM

## 2019-11-28 NOTE — ED Notes (Signed)
Pt had a mechanical fall, denying any LOC.  Daughter who was visiting said she found her sitting up and alert.  Pt is on Plavix.  Right thumb is swollen, discolored and joint appears deformed.

## 2019-11-28 NOTE — Progress Notes (Signed)
Orthopedic Tech Progress Note Patient Details:  Valerie Barker [birth year] [id number]  Ortho Devices Type of Ortho Device: Arm sling Splint Material: Fiberglass Ortho Device/Splint Location: RUE Ortho Device/Splint Interventions: Application, Adjustment   Post Interventions Patient Tolerated: Well Instructions Provided: Adjustment of device   Valerie Barker 11/28/2019, 4:17 AM

## 2019-11-28 NOTE — Discharge Instructions (Addendum)
For pain control you may take at 1000 mg of Tylenol every 8 hours scheduled.  In addition you can take 0.5 to 1 tablet of Norco/Vicodin every 8 hours for pain not controlled with the scheduled Tylenol.  

## 2019-11-28 NOTE — ED Provider Notes (Signed)
[facility] EMERGENCY DEPARTMENT Provider Note  CSN: [id number] Arrival date & time: 11/28/19 8502  Chief Complaint(s) Fall  HPI Valerie Barker is a 86 y.o. female with a past medical history listed below who presents to the emergency department after mechanical fall.  She reports that while using her walker, a calcar and the transition between hardwood floor and carpet.  Discussed order loss of balance and fall onto her right side.  Patient sustained injury to the right thumb.  Pain is mild to moderate ache.  Worse with range of motion and palpation of the right thumb.  Denies any head trauma or loss of consciousness.  Denies any neck pain, back pain, chest pain, abdominal pain, hip pain or other extremity pain.  She denies any other physical complaints.  HPI  Past Medical History Past Medical History:  Diagnosis Date  . Heart disease   . Hypertension   . IBS (irritable bowel syndrome)   . Stroke ([place]) 04/17/2018   around 2011  . TIA (transient ischemic attack)    Patient Active Problem List   Diagnosis Date Noted  . Excessive daytime sleepiness 11/20/2019  . 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 Acute urinary retention 04/17/2018  . Restless leg syndrome 08/26/2015  . Hyperlipidemia 08/26/2015  . Essential hypertension 02/06/2015  . Dizziness 02/06/2015  . Sinus bradycardia 02/06/2015  . Lower extremity edema 08/05/2014  . Short-term memory loss 02/01/2014  . Palpitation 06/03/2013  . TIA (transient ischemic attack) 01/28/2013  . Dyslipidemia 01/28/2013  . HTN (hypertension) 01/28/2013  . Arm numbness 01/28/2013   Home Medication(s) Prior to Admission medications   Medication Sig Start Date End Date Taking? Authorizing Provider  atorvastatin (LIPITOR) 10 MG tablet TAKE 1/2 TABLET EVERY DAY. 11/25/19  Yes Troy Sine, MD  Biotin 10000 MCG TABS Take 10,000 mcg by mouth every evening.    Yes [provider]  Calcium Carbonate-Vitamin D  (CALCIUM-D PO) Take 1 tablet by mouth every evening. 1200/500   Yes [provider]  Cholecalciferol (VITAMIN D) 2000 UNITS CAPS Take 2,000 Units by mouth every morning.    Yes [provider]  clopidogrel (PLAVIX) 75 MG tablet Take 75 mg by mouth daily.   Yes [provider]  Cyanocobalamin (VITAMIN B-12 PO) Take 2,500 mcg by mouth every morning.    Yes [provider]  hydrochlorothiazide (HYDRODIURIL) 25 MG tablet Take 25 mg by mouth daily as needed (swelling).    Yes [provider]  levothyroxine (SYNTHROID) 25 MCG tablet Take 25 mcg by mouth daily before breakfast.  04/01/19  Yes [provider]  Multiple Vitamin (MULTIVITAMIN) capsule Take 1 capsule by mouth every morning.    Yes [provider]  olmesartan (BENICAR) 40 MG tablet Take 40 mg by mouth daily.    Yes [provider]  rOPINIRole (REQUIP) 1 MG tablet Take 1-2 mg by mouth See admin instructions. Takes 1 at evening, 2 at bedtime 05/26/18  Yes [provider]  acetaminophen (TYLENOL) 500 MG tablet Take 2 tablets (1,000 mg total) by mouth every 8 (eight) hours for 5 days. Do not take more than 4000 mg of acetaminophen (Tylenol) in a 24-hour period. Please note that other medicines that you may be prescribed may have Tylenol as well. 11/28/19 12/03/19  Louretta Tantillo, Grayce Sessions, MD  HYDROcodone-acetaminophen (NORCO/VICODIN) 5-325 MG tablet Take 0.5-1 tablets by mouth every 8 (eight) hours as needed for up to 5 days for severe pain (That is not improved  by your scheduled acetaminophen regimen). Please do not exceed 4000 mg of acetaminophen (Tylenol) a 24-hour period. Please note that he may be prescribed additional medicine that contains acetaminophen. 11/28/19 12/03/19  Fatima Blank, MD                                                                                                                                    Past Surgical History Past Surgical History:   Procedure Laterality Date  . ABDOMINAL HYSTERECTOMY  1978  . BUNIONECTOMY  2006, 1996  . LAPAROSCOPIC SALPINGO OOPHERECTOMY  1988   Family History Family History  Problem Relation Age of Onset  . COPD Mother   . Schizophrenia Brother   . Stroke Father   . Breast cancer Neg Hx     Social History Social History   Tobacco Use  . Smoking status: Never Smoker  . Smokeless tobacco: Never Used  Vaping Use  . Vaping Use: Never used  Substance Use Topics  . Alcohol use: No  . Drug use: No   Allergies Duloxetine, Erythromycin, Mirtazapine, Trazodone, Zolpidem, Cyclobenzaprine, and Diazepam  Review of Systems Review of Systems All other systems are reviewed and are negative for acute change except as noted in the HPI  Physical Exam Vital Signs  I have reviewed the triage vital signs BP 125/73   Pulse (!) 46   Temp 98 F (36.7 C) (Oral)   Resp 18   SpO2 98%   Physical Exam Constitutional:      General: She is not in acute distress.    Appearance: She is well-developed. She is not diaphoretic.  HENT:     Head: Normocephalic and atraumatic.     Right Ear: External ear normal.     Left Ear: External ear normal.     Nose: Nose normal.  Eyes:     General: No scleral icterus.       Right eye: No discharge.        Left eye: No discharge.     Conjunctiva/sclera: Conjunctivae normal.     Pupils: Pupils are equal, round, and reactive to light.  Cardiovascular:     Rate and Rhythm: Normal rate and regular rhythm.     Pulses:          Radial pulses are 2+ on the right side and 2+ on the left side.       Dorsalis pedis pulses are 2+ on the right side and 2+ on the left side.     Heart sounds: Normal heart sounds. No murmur heard.  No friction rub. No gallop.   Pulmonary:     Effort: Pulmonary effort is normal. No respiratory distress.     Breath sounds: Normal breath sounds. No stridor. No wheezing.  Abdominal:     General: There is no distension.     Palpations:  Abdomen is soft.     Tenderness: There is no abdominal  tenderness.  Musculoskeletal:     Right hand: Deformity (of right thumb), tenderness and bony tenderness present. Decreased range of motion. Normal sensation.       Hands:     Cervical back: Normal range of motion and neck supple. No bony tenderness.     Thoracic back: No bony tenderness.     Lumbar back: No bony tenderness.     Comments: Clavicles stable. Chest stable to AP/Lat compression. Pelvis stable to Lat compression.  No chest or abdominal wall contusion.  Skin:    General: Skin is warm and dry.     Findings: No erythema or rash.  Neurological:     Mental Status: She is alert and oriented to person, place, and time.     Comments: Moving all extremities     ED Results and Treatments Labs (all labs ordered are listed, but only abnormal results are displayed) Labs Reviewed - No data to display                                                                                                                       EKG  EKG Interpretation  Date/Time:    Ventricular Rate:    PR Interval:    QRS Duration:   QT Interval:    QTC Calculation:   R Axis:     Text Interpretation:        Radiology DG Finger Thumb Right  Result Date: 11/28/2019 CLINICAL DATA:  Right thumb fracture dislocation, post reduction imaging EXAM: RIGHT THUMB 2+V COMPARISON:  3:18 a.m. FINDINGS: Two view radiograph of the right thumb performed within an external immobilizer demonstrates reduction of the first MCP dislocation with fracture fragments now in near anatomic alignment. Comminuted fracture of the base of the distal phalanx is again identified with fracture fragments in grossly anatomic alignment. There is persistent dorsal displacement of a ulnar fracture fragment of the base of the distal phalanx. Severe degenerative arthritis again noted at the first carpometacarpal joint. IMPRESSION: 1. Interval reduction of the first MCP dislocation,  fracture fragments in grossly anatomic alignment. 2. Comminuted fracture distal phalanx right thumb. Fracture fragments are in grossly anatomic alignment. Electronically Signed   By: Fidela Salisbury MD   On: 11/28/2019 03:59   DG Finger Thumb Right  Result Date: 11/28/2019 CLINICAL DATA:  86 year old female status post reduction of the right thumb. EXAM: RIGHT THUMB 2+V COMPARISON:  Earlier radiograph dated 11/28/2019. FINDINGS: Interval reduction of the previously seen dislocated first MCP joint and placement of a cast over the thumb. IMPRESSION: Interval reduction of the previously seen dislocated first MCP joint. Electronically Signed   By: Anner Crete M.D.   On: 11/28/2019 03:42   DG Finger Thumb Right  Result Date: 11/28/2019 CLINICAL DATA:  86 year old female with deformity of the right thumb. EXAM: RIGHT THUMB 2+V COMPARISON:  None. FINDINGS: There is a comminuted fracture of the base of the distal phalanx of the thumb with extension of the fracture into the interphalangeal  articular surface. There is dorsal dislocation of the first MCP joint. The bones are osteopenic. There is severe osteoarthritic changes of the base of the thumb. The soft tissues are grossly unremarkable. IMPRESSION: Comminuted intra-articular fracture of the base of the distal phalanx of the thumb and dorsal dislocation of the first MCP joint. Electronically Signed   By: Anner Crete M.D.   On: 11/28/2019 02:23    Pertinent labs & imaging results that were available during my care of the patient were reviewed by me and considered in my medical decision making (see chart for details).  Medications Ordered in ED Medications  lidocaine (XYLOCAINE) 2 % (with pres) injection 200 mg (has no administration in time range)  fentaNYL (SUBLIMAZE) injection 75 mcg (75 mcg Intravenous Given 11/28/19 0203)  fentaNYL (SUBLIMAZE) injection 50 mcg (50 mcg Intravenous Given 11/28/19 0259)  acetaminophen (TYLENOL) tablet 1,000 mg  (1,000 mg Oral Given 11/28/19 0357)                                                                                                                                    Procedures .Nerve Block  Date/Time: 11/28/2019 3:13 AM Performed by: Fatima Blank, MD Authorized by: Fatima Blank, MD   Consent:    Consent obtained:  Verbal   Consent given by:  Patient   Risks discussed:  Allergic reaction, bleeding, intravenous injection, pain and unsuccessful block   Alternatives discussed:  Delayed treatment and alternative treatment Indications:    Indications:  Procedural anesthesia Location:    Body area:  Upper extremity   Upper extremity nerve:  Radial   Laterality:  Right Pre-procedure details:    Skin preparation:  2% chlorhexidine   Preparation: Patient was prepped and draped in usual sterile fashion   Procedure details (see MAR for exact dosages):    Block needle gauge:  27 G   Anesthetic injected:  Lidocaine 2% w/o epi   Steroid injected:  None   Additive injected:  None   Injection procedure:  Anatomic landmarks identified, incremental injection, negative aspiration for blood, introduced needle and anatomic landmarks palpated Post-procedure details:    Outcome:  Pain improved   Patient tolerance of procedure:  Tolerated well, no immediate complications .Nerve Block  Date/Time: 11/28/2019 3:15 AM Performed by: Fatima Blank, MD Authorized by: Fatima Blank, MD   Indications:    Indications:  Procedural anesthesia Location:    Body area:  Upper extremity   Upper extremity nerve blocked: median.   Laterality:  Right Pre-procedure details:    Skin preparation:  2% chlorhexidine   Preparation: Patient was prepped and draped in usual sterile fashion   Procedure details (see MAR for exact dosages):    Block needle gauge:  27 G   Anesthetic injected:  Lidocaine 2% w/o epi   Steroid injected:  None   Injection procedure:  Anatomic landmarks  identified, incremental injection, negative aspiration  for blood, introduced needle and anatomic landmarks palpated Post-procedure details:    Outcome:  Pain improved   Patient tolerance of procedure:  Tolerated well, no immediate complications .Ortho Injury Treatment  Date/Time: 11/28/2019 3:16 AM Performed by: Fatima Blank, MD Authorized by: Fatima Blank, MD   Consent:    Consent obtained:  Verbal   Consent given by:  Patient   Risks discussed:  Irreducible dislocation, recurrent dislocation, stiffness, vascular damage and restricted joint movement   Alternatives discussed:  Alternative treatment and immobilizationInjury location: finger Location details: right thumb Injury type: fracture-dislocation Fracture type: proximal phalanx MCP joint involved: yes Pre-procedure distal perfusion: diminished Pre-procedure neurological function: normal Pre-procedure range of motion: reduced Anesthesia: nerve block  Anesthesia: Local anesthesia used: yes  Patient sedated: NoManipulation performed: yes Skeletal traction used: yes Reduction successful: yes X-ray confirmed reduction: yes Immobilization: splint Splint type: thumb spica Supplies used: Ortho-Glass Post-procedure neurovascular assessment: post-procedure neurovascularly intact Post-procedure distal perfusion: normal Post-procedure neurological function: normal Post-procedure range of motion: improved Patient tolerance: patient tolerated the procedure well with no immediate complications     (including critical care time)  Medical Decision Making / ED Course I have reviewed the nursing notes for this encounter and the patient's prior records (if available in EHR or on provided paperwork).   Quenesha Douglass was evaluated in Emergency Department on 11/28/2019 for the symptoms described in the history of present illness. She was evaluated in the context of the global COVID-19 pandemic, which  necessitated consideration that the patient might be at risk for infection with the SARS-CoV-2 virus that causes COVID-19. Institutional protocols and algorithms that pertain to the evaluation of patients at risk for COVID-19 are in a state of rapid change based on information released by regulatory bodies including the CDC and federal and state organizations. These policies and algorithms were followed during the patient's care in the ED.  Mechanical fall resulting in right thumb injury.  Evidence of decreased blood flow to the distal thumb.  Patient still has sensation intact.  Plain film confirmed fracture of the base of the phalanx with dislocation of the first MCP.   Patient provided with IV fentanyl.  Radial and medial nerve block plus hematoma block used in reduction.  Thumb spica applied.  Repeat plain film showed improved alignment after reduction.      Final Clinical Impression(s) / ED Diagnoses Final diagnoses:  Closed displaced fracture of proximal phalanx of right thumb, initial encounter  Fall in home, initial encounter   The patient appears reasonably screened and/or stabilized for discharge and I doubt any other medical condition or other [facility] requiring further screening, evaluation, or treatment in the ED at this time prior to discharge. Safe for discharge with strict return precautions.  Disposition: Discharge  Condition: Good  I have discussed the results, Dx and Tx plan with the patient/family who expressed understanding and agree(s) with the plan. Discharge instructions discussed at length. The patient/family was given strict return precautions who verbalized understanding of the instructions. No further questions at time of discharge.    ED Discharge Orders         Ordered    acetaminophen (TYLENOL) 500 MG tablet  Every 8 hours        11/28/19 0408    HYDROcodone-acetaminophen (NORCO/VICODIN) 5-325 MG tablet  Every 8 hours PRN        11/28/19 0408          [facility] narcotic database reviewed and no  active prescriptions noted.   Follow Up: Deland Pretty, [address] [place] [address] [phone]  Schedule an appointment as soon as possible for a visit  As needed  [address], [place] [address] [phone]  Schedule an appointment as soon as possible for a visit  For close follow up to assess for thumb fracture/dislocation      This chart was dictated using voice recognition software.  Despite best efforts to proofread,  errors can occur which can change the documentation meaning.   Fatima Blank, MD 11/28/19 [phone]

## 2019-11-29 DIAGNOSIS — S63114A Dislocation of metacarpophalangeal joint of right thumb, initial encounter: Secondary | ICD-10-CM | POA: Diagnosis not present

## 2019-11-29 DIAGNOSIS — M13841 Other specified arthritis, right hand: Secondary | ICD-10-CM | POA: Diagnosis not present

## 2019-11-29 DIAGNOSIS — M79644 Pain in right finger(s): Secondary | ICD-10-CM | POA: Diagnosis not present

## 2019-12-02 DIAGNOSIS — Z1159 Encounter for screening for other viral diseases: Secondary | ICD-10-CM | POA: Diagnosis not present

## 2019-12-04 ENCOUNTER — Telehealth: Payer: Self-pay | Admitting: Cardiovascular Disease

## 2019-12-04 NOTE — Telephone Encounter (Signed)
New Message:   Daughter called and wanted to know if Dr Claiborne Billings received a report from Dr Brett Fairy? It was a sleep Study that she had on 11-03-19. The doctor wants her to see Dr Claiborne Billings.

## 2019-12-04 NOTE — Telephone Encounter (Signed)
Dr. Brett Fairy had reccommended the pt sees Dr. Claiborne Billings re: a sleep study he had done on the pt 11/03/19.Valerie Barker pt will follow up with Dr. Claiborne Billings 12/12/19 and the report has been mailed to our office to Dr. Claiborne Billings. Will forward to Dr. Evette Georges nurse as Juluis Rainier for when she receives the report.

## 2019-12-09 DIAGNOSIS — Z1159 Encounter for screening for other viral diseases: Secondary | ICD-10-CM | POA: Diagnosis not present

## 2019-12-12 ENCOUNTER — Encounter: Payer: Self-pay | Admitting: Cardiovascular Disease

## 2019-12-12 ENCOUNTER — Ambulatory Visit (INDEPENDENT_AMBULATORY_CARE_PROVIDER_SITE_OTHER): Payer: Medicare Other | Admitting: Cardiovascular Disease

## 2019-12-12 ENCOUNTER — Other Ambulatory Visit: Payer: Self-pay

## 2019-12-12 DIAGNOSIS — E039 Hypothyroidism, unspecified: Secondary | ICD-10-CM

## 2019-12-12 DIAGNOSIS — I491 Atrial premature depolarization: Secondary | ICD-10-CM | POA: Diagnosis not present

## 2019-12-12 DIAGNOSIS — I10 Essential (primary) hypertension: Secondary | ICD-10-CM

## 2019-12-12 DIAGNOSIS — G2581 Restless legs syndrome: Secondary | ICD-10-CM | POA: Diagnosis not present

## 2019-12-12 DIAGNOSIS — G459 Transient cerebral ischemic attack, unspecified: Secondary | ICD-10-CM | POA: Diagnosis not present

## 2019-12-12 DIAGNOSIS — E7800 Pure hypercholesterolemia, unspecified: Secondary | ICD-10-CM

## 2019-12-12 DIAGNOSIS — Z8673 Personal history of transient ischemic attack (TIA), and cerebral infarction without residual deficits: Secondary | ICD-10-CM | POA: Diagnosis not present

## 2019-12-12 MED ORDER — HYDROCHLOROTHIAZIDE 12.5 MG PO TABS
12.5000 mg | ORAL_TABLET | Freq: Every day | ORAL | 1 refills | Status: DC
Start: 2019-12-12 — End: 2020-03-05

## 2019-12-12 NOTE — Patient Instructions (Signed)
Medication Instructions:  DECREASE YOUR HCTZ TO 12.5MG  DAILY  *If you need a refill on your cardiac medications before your next appointment, please call your pharmacy*   Lab Work: Your physician recommends that you return for lab work today: BMET, Magnesium  If you have labs (blood work) drawn today and your tests are completely normal, you will receive your results only by: Marland Kitchen MyChart Message (if you have MyChart) OR . A paper copy in the mail If you have any lab test that is abnormal or we need to change your treatment, we will call you to review the results.   Follow-Up: At [facility], you and your health needs are our priority.  As part of our continuing mission to provide you with exceptional heart care, we have created designated Provider Care Teams.  These Care Teams include your primary Cardiologist (physician) and Advanced Practice Providers (APPs -  Physician Assistants and Nurse Practitioners) who all work together to provide you with the care you need, when you need it.  We recommend signing up for the patient portal called "MyChart".  Sign up information is provided on this After Visit Summary.  MyChart is used to connect with patients for Virtual Visits (Telemedicine).  Patients are able to view lab/test results, encounter notes, upcoming appointments, etc.  Non-urgent messages can be sent to your provider as well.   To learn more about what you can do with MyChart, go to [email or web address].    Your next appointment:   3-4 month(s)  The format for your next appointment:   In Person  Provider:   You may see Shelva Majestic, MD or one of the following Advanced Practice Providers on your designated Care Team:    Almyra Deforest, PA-C  Fabian Sharp, PA-C or   Roby Lofts, [place]

## 2019-12-12 NOTE — Progress Notes (Signed)
Patient ID: Valerie Barker, female   DOB: 11-06-1922, 86 y.o.   MRN: [id number]     HPI: Valerie Barker is a 86 y.o. female who presents to the office for an 8 month cardiology followup evaluation.  Valerie Barker is the wife of my former patient Dr. Buckner Malta who died on 2015/03/19.  Remotely, Valerie Barker suffered a TIA and has documented old asymptomatic lacunar infarcts. Prior to undergoing urologic surgery at [facility] in 2012 a nuclear perfusion study showed normal perfusion. An echo Doppler study demonstrated mild to moderate TR, trace MR, and mild aortic sclerosis without stenosis. Valerie Barker had normal systolic function. Carotid studies done in 2011 were normal.  Valerie Barker was  hospitalized for 2 nights after experiencing an TIA on 01/08/2013. Valerie Barker symptoms resolved spontaneously. A CT of Valerie Barker head showed mild atrophy with extensive supratentorial small vessel disease without intracranial mass, hemorrhage or acute appearing infarction. An MRI of Valerie Barker brain did not show any acute intracranial findings but did show advanced atrophy with chronic microvascular ischemic changes. MRA did not reveal any intracranial flow reducing lesions. During that hospitalization a f/u echo Doppler study  on 01/29/2013 showed an ejection fraction at 65%. There was mild aortic sclerosis without stenosis. Carotid Doppler study showed minimal plaque with less than 39% reduction. Valerie Barker  was sent home on atorvastatin 10 mg Plavix 75 mg in addition to Valerie Barker valsartan HCT 320/12.5 and zolpidem.  Valerie Barker has had issues with swelling of legs and feet.  Valerie Barker also has continued issues with restless legs.  Valerie Barker has been taking requip 0.5 mg shortly before going to bed, but it takes a while for the medication to work and usually does not completely suffice.  At times Valerie Barker has taken a half a pill if Valerie Barker is to have a daytime nap.  When I last saw Valerie Barker, Valerie Barker was complaining of experiencing a "bobble" sensation.  Valerie Barker underwent carotid duplex imaging  which was normal in November 2016.  Valerie Barker denies any episodes of chest pain.  Valerie Barker is unaware of palpitations.  Valerie Barker admits to being fatigued , but actually feels improved from previously.  At times if Valerie Barker is on Valerie Barker back for certain duration.  Valerie Barker does note transient right arm paresthesias.  Valerie Barker denies associated chest pressure.  Valerie Barker presents for evaluation.  Valerie Barker underwent extensive dental work with Valerie Barker dentist, Chief Financial Officer, and endodontist.  Valerie Barker also had eyelid lift surgery at  [facility] in December 2017 and tolerated this well from a cardiovascular standpoint.  When I last saw Valerie Barker felt well was experiencing significant tiredness and fatigue.  At times Valerie Barker noted  some dizziness with movement.  Valerie Barker has restless legs for which Valerie Barker is on requip in addition to gabapentin.  Valerie Barker denied chest pain.  Was unaware of palpitations.  I last saw Valerie Barker in May 2019 following an episode of dizziness.  As Valerie Barker was getting off the toilet and was trying to get or toilet paper Valerie Barker bent over into the cabinet and when Valerie Barker stood up Valerie Barker became dizzy lightheaded fell backwards and hit Valerie Barker head.  Since Valerie Barker was on chronic Plavix therapy Valerie Barker presented to the emergency room for evaluation.  No acute abnormality was noted.  However, Valerie Barker pulse in the emergency room was 52 and blood pressure 107/41.  Valerie Barker has been on losartan HCT 100/25 mg, Toprol-XL 12.5 mg and this has been held if Valerie Barker heart rate is less than 55.  I reviewed Valerie Barker emergency room evaluation.  At that time, I elected to wean and discontinue Valerie Barker very low-dose Toprol-XL 12.5 mg.    I saw Valerie Barker in September 2019. Unfortunately Valerie Barker 33 year old sister passed away 4 weeks ago.  Valerie Barker had spoken to Valerie Barker sister on a daily basis.  Valerie Barker continues to live in independent living at [facility].  Valerie Barker denies chest pain.  Valerie Barker is unaware of palpitations.  Valerie Barker has not required use of HCTZ.  Continues to be on valsartan 320 mg for hypertension.  Valerie Barker is tolerating atorvastatin for  hyperlipidemia.   Valerie Barker has continued to live in independent living at [facility].  I last evaluated Valerie Barker in a telemedicine visit in January 2021.  At that time Valerie Barker denied  any chest pain or shortness of breath or palpitations.  Valerie Barker admits to significant fatigability.  Valerie Barker believes Valerie Barker is sleeping a lot of the time.  Valerie Barker does sleep well at night.  Valerie Barker has not been as active as Valerie Barker had in the past due to the COVID-19 pandemic.  Valerie Barker was concerned about the possibility of early Alzheimer's disease since Valerie Barker is starting to notice some short-term memory loss.  Valerie Barker also admits to swelling in Valerie Barker right foot and was given HCTZ 25 mg by Dr. Shelia Media..   Since Valerie Barker last evaluation Valerie Barker was evaluated by Almyra Deforest, PA in April 2021.  At that time Valerie Barker was taking HCTZ 25 mg daily rather than as needed as result of leg swelling.  He was concerned that Valerie Barker dependent edema was due to venous stasis and recommended Valerie Barker change this back to just as needed.  He scheduled Valerie Barker for an echo Doppler study which was done on 08/11/2019 which showed an EF of 60 to 65%.  There is grade 1 diastolic dysfunction.  There were no wall motion abnormalities.  Valerie Barker had mildly increased RV systolic pressure 35 mg.  There was mild aortic sclerosis without stenosis.     Valerie Barker was evaluated by Dr. Maureen Chatters of [facility] neurology.  Valerie Barker had previously seen Dr. Leta Baptist and has been on treatment for restless leg syndrome.  Valerie Barker was evaluated for memory issues but due to complaints of significant fatigability and sleepiness Valerie Barker was referred for a sleep study at their office which was done on 11/03/2019.  This apparently showed mild overall sleep apnea with an AHI of 10.5/h.  AHI during REM sleep was 0.  There was concern that Valerie Barker may have had heart rate irregularity throughout the test and Valerie Barker cyclic apnea seem to correlate with Valerie Barker irregular heart rate and rhythm.  There was concern of whether or not Valerie Barker had atrial fibrillation and cardiology evaluation was  advised.  Valerie Barker was recently evaluated in the emergency room after a mechanical fall when Valerie Barker was walking with Valerie Barker walker and tripped between the transition of the hardwood floor on carpet.  Valerie Barker sustained injury to Valerie Barker right thumb and apparently is now wearing a thumb splint for small fracture.  Presently, Valerie Barker denies chest pain or shortness of breath.  Valerie Barker continues to note daytime sleepiness.  Valerie Barker apparently has continued to take hydrochlorothiazide daily, olmesartan 40 mg, and is on record for Valerie Barker restless legs.  Valerie Barker also is on atorvastatin for hyperlipidemia and levothyroxine for hypothyroidism.  Valerie Barker is unaware of any palpitations.  Of note, in the past when very low-dose beta-blocker therapy was instituted Valerie Barker did get bradycardic in the low 50s.   Past Medical History:  Diagnosis Date   Heart disease    Hypertension  IBS (irritable bowel syndrome)    Stroke ([place]) 04/17/2018   around 2011   TIA (transient ischemic attack)     Past Surgical History:  Procedure Laterality Date   ABDOMINAL HYSTERECTOMY  1978   BUNIONECTOMY  2006, [place]    Allergies  Allergen Reactions   Duloxetine Other (See Comments)    unknown   Erythromycin    Mirtazapine     Other reaction(s): Other (See Comments) unknown   Trazodone     Other reaction(s): Other (See Comments) unknown   Zolpidem     Other reaction(s): Other (See Comments) unknown   Cyclobenzaprine Anxiety    Unknown   Diazepam Rash    unknown    Current Outpatient Medications  Medication Sig Dispense Refill   atorvastatin (LIPITOR) 10 MG tablet TAKE 1/2 TABLET EVERY DAY. 15 tablet 5   Biotin 10000 MCG TABS Take 10,000 mcg by mouth every evening.      Calcium Carbonate-Vitamin D (CALCIUM-D PO) Take 1 tablet by mouth every evening. 1200/500     Cholecalciferol (VITAMIN D) 2000 UNITS CAPS Take 2,000 Units by mouth every morning.      clopidogrel (PLAVIX) 75 MG tablet Take  75 mg by mouth daily.     Cyanocobalamin (VITAMIN B-12 PO) Take 2,500 mcg by mouth every morning.      hydrochlorothiazide (HYDRODIURIL) 12.5 MG tablet Take 1 tablet (12.5 mg total) by mouth daily. 90 tablet 1   levothyroxine (SYNTHROID) 25 MCG tablet Take 25 mcg by mouth daily before breakfast.      Multiple Vitamin (MULTIVITAMIN) capsule Take 1 capsule by mouth every morning.      olmesartan (BENICAR) 40 MG tablet Take 40 mg by mouth daily.      penicillin v potassium (VEETID) 500 MG tablet Take 3 tablets by mouth daily.     rOPINIRole (REQUIP) 1 MG tablet Take 1-2 mg by mouth See admin instructions. Takes 1 at evening, 2 at bedtime     No current facility-administered medications for this visit.    Social History   Socioeconomic History   Marital status: Married    Spouse name: Not on file   Number of children: Not on file   Years of education: Not on file   Highest education level: Not on file  Occupational History   Not on file  Tobacco Use   Smoking status: Never Smoker   Smokeless tobacco: Never Used  Vaping Use   Vaping Use: Never used  Substance and Sexual Activity   Alcohol use: No   Drug use: No   Sexual activity: Not on file  Other Topics Concern   Not on file  Social History Narrative   Lives in [place] at [place].  Education BA degree.  Retired.  3 children.  Son, [place].  Caffeine 1=2 cups daily.   Social Determinants of Health   Financial Resource Strain:    Difficulty of Paying Living Expenses: Not on file  Food Insecurity:    Worried About Charity fundraiser in the Last Year: Not on file   [employer] of Food in the Last Year: Not on file  Transportation Needs:    Lack of Transportation (Medical): Not on file   Lack of Transportation (Non-Medical): Not on file  Physical Activity:    Days of Exercise per Week: Not on file   Minutes of Exercise per Session: Not on file  Stress:    Feeling of Stress :  Not on file   Social Connections:    Frequency of Communication with Friends and Family: Not on file   Frequency of Social Gatherings with Friends and Family: Not on file   Attends Religious Services: Not on file   Active Member of Clubs or Organizations: Not on file   Attends Club or Organization Meetings: Not on file   Marital Status: Not on file  Intimate Partner Violence:    Fear of Current or Ex-Partner: Not on file   Emotionally Abused: Not on file   Physically Abused: Not on file   Sexually Abused: Not on file   Socially Valerie Barker is widowed as of December 2016.  Valerie Barker husband was a former Scientist, physiological of education that [employer] and developed progressive dementia Prior to his death  Family History  Problem Relation Age of Onset   COPD Mother    Schizophrenia Brother    Stroke Father    Breast cancer Neg Hx    ROS General: Negative; No fevers, chills, or night sweats;  HEENT: Negative; No changes in vision or hearing, sinus congestion, difficulty swallowing Pulmonary: Negative; No cough, wheezing, shortness of breath, hemoptysis Cardiovascular: Negative; No chest pain, presyncope, syncope, palpitations Positive for leg swelling  GI: Negative; No nausea, vomiting, diarrhea, or abdominal pain GU: Positive for recurrent urinary incontinence despite Valerie Barker prior urologic surgery; No dysuria, hematuria, or difficulty voiding Musculoskeletal: Right thumb fracture Hematologic/Oncology: Negative; no easy bruising, bleeding Endocrine: Negative; no heat/cold intolerance; no diabetes Neuro: Positive for decreased short-term memory and history of TIA; Episode of right arm numbness in July 2017; no changes in balance, headaches Skin: Negative; No rashes or skin lesions Psychiatric: Negative; No behavioral problems, depression Sleep: Positive for restless leg syndrome; positive for daytime sleepiness no snoring,  bruxism, hypnogognic hallucinations, no cataplexy Other comprehensive 14 point system  review is negative.   PE BP 122/84    Pulse 71    Ht 4' 11" (1.499 m)    Wt 143 lb (64.9 kg)    BMI 28.88 kg/m    Repeat blood pressure by me was 116/80  Wt Readings from Last 3 Encounters:  12/12/19 143 lb (64.9 kg)  10/09/19 143 lb (64.9 kg)  07/12/19 141 lb (64 kg)   General: Alert, oriented, no distress.  Skin: normal turgor, no rashes, warm and dry HEENT: Normocephalic, atraumatic. Pupils equal round and reactive to light; sclera anicteric; extraocular muscles intact;  Nose without nasal septal hypertrophy Mouth/Parynx benign; Mallinpatti scale 2 Neck: No JVD, no carotid bruits; normal carotid upstroke Lungs: clear to ausculatation and percussion; no wheezing or rales Chest wall: without tenderness to palpitation Heart: PMI not displaced, RRR, s1 s2 normal, 1/6 systolic murmur, no diastolic murmur, no rubs, gallops, thrills, or heaves Abdomen: soft, nontender; no hepatosplenomehaly, BS+; abdominal aorta nontender and not dilated by palpation. Back: no CVA tenderness Pulses 2+ Musculoskeletal: full range of motion, normal strength, no joint deformities Extremities: no clubbing cyanosis or edema, Homan's sign negative  Neurologic: grossly nonfocal; Cranial nerves grossly wnl Psychologic: Normal mood and affect   ECG (independently read by me): Sinus rhythm with frequent PACs, QTc interval 412 ms.  No ST segment changes   September 2019 ECG (independently read by me): Normal sinus rhythm at 61 bpm.  No ectopy.  Normal intervals.  September 2018 ECG (independently read by me): sinus bradycardia 53 bpm.  Normal intervals.  No ST segment changes.  Decreased voltage anterolaterally  January 2018 ECG (independently read by me): Sinus bradycardia  57 bpm.  No ST segment changes.  There was malpositioning of Valerie Barker anterior leads.  May 2017 ECG (independently read by me): Sinus bradycardia 53 bpm.  No ectopy.  Normal intervals.  No ST segment changes.  November 2016 ECG  (independently read by me): Sinus bradycardia 58 bpm.  Normal intervals.    October 2015 ECG (and apparently read by me) : Sinus rhythm with frequent PACs and transient atrial bigeminal pattern  Prior November 2014 ECG: Sinus rhythm with occasional PACs. Intervals are normal. Nonspecific T changes.  LABS: BMP Latest Ref Rng & Units 12/12/2019 07/12/2019 04/19/2018  Glucose 65 - 99 mg/dL 97 82 104(H)  BUN 10 - 36 mg/dL _0 Creatinine 0.57 - 1.00 mg/dL 0.94 0.97 0.90  BUN/Creat Ratio 12 - _1 -  Sodium 134 - 144 mmol/L 138 140 138  Potassium 3.5 - 5.2 mmol/L 4.6 4.6 3.8  Chloride 96 - 106 mmol/L 100 102 105  CO2 20 - 29 mmol/L _2 Calcium 8.7 - 10.3 mg/dL 10.3 9.2 8.9   Hepatic Function Latest Ref Rng & Units 04/19/2018 04/18/2018 04/17/2018  Total Protein 6.5 - 8.1 g/dL 5.7(L) 5.9(L) 6.1(L)  Albumin 3.5 - 5.0 g/dL 3.3(L) 3.6 3.9  AST 15 - 41 U/L _3 ALT 0 - 44 U/L _4 Alk Phosphatase 38 - 126 U/L 42 47 51  Total Bilirubin 0.3 - 1.2 mg/dL 0.8 0.8 0.8  Bilirubin, Direct 0.0 - 0.3 mg/dL - - -   CBC Latest Ref Rng & Units 07/12/2019 04/19/2018 04/18/2018  WBC 3.4 - 10.8 x10E3/uL 7.2 6.6 8.2  Hemoglobin 11.1 - 15.9 g/dL 12.7 11.6(L) 12.1  Hematocrit 34.0 - 46.6 % 38.2 35.4(L) 36.7  Platelets 150 - 450 x10E3/uL 190 164 184   Lab Results  Component Value Date   TSH 5.800 (H) 07/12/2019     Lipid Panel     Component Value Date/Time   CHOL 128 04/18/2018 0458   TRIG 67 04/18/2018 0458   HDL 67 04/18/2018 0458   CHOLHDL 1.9 04/18/2018 0458   VLDL 13 04/18/2018 0458   LDLCALC 48 04/18/2018 0458     RADIOLOGY: Ct Head (brain) Wo Contrast  01/28/2013   CLINICAL DATA:  Right upper extremity weakness and numbness  EXAM: CT HEAD WITHOUT CONTRAST  TECHNIQUE: Contiguous axial images were obtained from the base of the skull through the vertex without intravenous contrast. Study was obtained within 24 hr of patient's arrival at the emergency department.  COMPARISON:   Brain MRI April 27, 2009 and brain CT April 27, 2009  FINDINGS: There is mild diffuse atrophy. There is no mass, hemorrhage, extra-axial fluid collection, or midline shift. There is extensive small vessel disease throughout the centra semiovale bilaterally, a stable finding. There is no new gray-white compartment lesion. There is no demonstrable acute infarct. Bony calvarium appears intact. The mastoid air cells are clear.  IMPRESSION: Mild atrophy with extensive supratentorial small vessel disease. No intracranial mass, hemorrhage, or acute appearing infarct.   Electronically Signed   By: Lowella Grip M.D.   On: 01/28/2013 14:42   Mr Brain Wo Contrast  01/28/2013   CLINICAL DATA:  Numbness in the arms. Dry hacking cough. History of hypertension and dyslipidemia.  EXAM: MRI HEAD WITHOUT CONTRAST  MRA HEAD WITHOUT CONTRAST  TECHNIQUE: Multiplanar, multiecho pulse sequences of the brain and surrounding structures were obtained without intravenous contrast. Angiographic images of the head were obtained using MRA  technique without contrast.  COMPARISON:  CT 01/28/2013.  FINDINGS: MRI HEAD FINDINGS  The patient was unable to remain motionless for the exam. Small or subtle lesions could be overlooked.  No evidence for acute infarction, hemorrhage, mass lesion, hydrocephalus, or extra-axial fluid. Moderate age-related atrophy. Extensive chronic microvascular ischemic change. Basal ganglia mineralization but no foci of chronic hemorrhage. Flow voids are maintained. No osseous findings. No remote large vessel infarct. Bilateral cataract extraction. No acute sinus or mastoid fluid. Good general agreement with prior CT.  MRA HEAD FINDINGS  Grossly patent internal carotid arteries, and basilar artery. Vertebrals are codominant. No flow-limiting intracranial stenosis, branch occlusion, or aneurysm is evident on this motion degraded exam.  IMPRESSION: MRI HEAD IMPRESSION  No acute intracranial findings are evident.  There is advanced atrophy with chronic microvascular ischemic change.  MRA HEAD IMPRESSION  No intracranial flow reducing lesion is evident.   Electronically Signed   By: Rolla Flatten M.D.   On: 01/28/2013 18:47   Mr Jodene Nam Head/brain Wo Cm  01/28/2013   CLINICAL DATA:  Numbness in the arms. Dry hacking cough. History of hypertension and dyslipidemia.  EXAM: MRI HEAD WITHOUT CONTRAST  MRA HEAD WITHOUT CONTRAST  TECHNIQUE: Multiplanar, multiecho pulse sequences of the brain and surrounding structures were obtained without intravenous contrast. Angiographic images of the head were obtained using MRA technique without contrast.  COMPARISON:  CT 01/28/2013.  FINDINGS: MRI HEAD FINDINGS  The patient was unable to remain motionless for the exam. Small or subtle lesions could be overlooked.  No evidence for acute infarction, hemorrhage, mass lesion, hydrocephalus, or extra-axial fluid. Moderate age-related atrophy. Extensive chronic microvascular ischemic change. Basal ganglia mineralization but no foci of chronic hemorrhage. Flow voids are maintained. No osseous findings. No remote large vessel infarct. Bilateral cataract extraction. No acute sinus or mastoid fluid. Good general agreement with prior CT.  MRA HEAD FINDINGS  Grossly patent internal carotid arteries, and basilar artery. Vertebrals are codominant. No flow-limiting intracranial stenosis, branch occlusion, or aneurysm is evident on this motion degraded exam.  IMPRESSION: MRI HEAD IMPRESSION  No acute intracranial findings are evident. There is advanced atrophy with chronic microvascular ischemic change.  MRA HEAD IMPRESSION  No intracranial flow reducing lesion is evident.   Electronically Signed   By: Rolla Flatten M.D.   On: 01/28/2013 18:47   IMPRESSION:  1. Premature atrial contractions   2. Essential hypertension   3. Pure hypercholesterolemia   4. History of TIA (transient ischemic attack)   5. Restless leg syndrome   6. Hypothyroidism,  unspecified type     ASSESSMENT AND PLAN: Ms. Estill is a 86 years old Caucasian female with history of prior TIA's and remotely had been  diagnosed as having a TIA with old asymptomatic lacunar infarct. On CT, MRI and MRA studies  significant small vessel disease of Valerie Barker brain had been demonstrated.  Valerie Barker has normal systolic function and evidence for aortic sclerosis on echo assessment. Valerie Barker has been Plavix with Valerie Barker TIA history.  At a previous evaluation, Valerie Barker had developed significant sinus bradycardia and ultimately Valerie Barker low-dose Toprol-XL 12.5 mg was weaned and DC'd.  At follow-up evaluation resting pulse was in the 60s.  Apparently Valerie Barker has been taking hydrochlorothiazide and recently Valerie Barker had had a fall.  Valerie Barker did not have any presyncope or syncope in the fall was due to tripping over the carpet with Valerie Barker walker on the transition zone  between the hardwood floor.  I reviewed Valerie Barker records from [place],  Dr. Brett Fairy, echo Doppler study in May 2021 and recent ER evaluation.  Valerie Barker continues to have hyperdynamic LV function with mild grade 1 diastolic dysfunction and mild aortic sclerosis without stenosis.  I have recommended Valerie Barker reduce Valerie Barker HCTZ from 25 mg down to 12.5 mg and take this on an as-needed basis.  I reviewed Valerie Barker sleep study which revealed mild overall sleep apnea and there was concern of heart rate irregularity which was associated with some of Valerie Barker apnea cycles.  Valerie Barker ECG today shows PACs.  Valerie Barker is in sinus rhythm.  Valerie Barker EKG today does not demonstrate atrial fibrillation.  I suspect Valerie Barker may have had frequent PACs noted on Valerie Barker sleep study.  I did discuss with Valerie Barker and Valerie Barker son the possibility of very low-dose beta-blocker therapy with Bystolic but I will defer this in light of with Valerie Barker history of bradycardia with very low-dose beta-blocker in the past I will not start therapy at this time.  I will check a bmet and magnesium level to make certain Valerie Barker electrolytes are in order.  Valerie Barker is on levothyroxine thyroid  function studies in April showed a TSH of 5.8.  I will see Valerie Barker in 3 to 4 months for follow-up evaluation or sooner as needed.  Troy Sine, MD, [facility]  12/14/2019 2:33 PM

## 2019-12-13 LAB — BASIC METABOLIC PANEL
BUN/Creatinine Ratio: 19 (ref 12–28)
BUN: 18 mg/dL (ref 10–36)
CO2: 27 mmol/L (ref 20–29)
Calcium: 10.3 mg/dL (ref 8.7–10.3)
Chloride: 100 mmol/L (ref 96–106)
Creatinine, Ser: 0.94 mg/dL (ref 0.57–1.00)
GFR calc Af Amer: 60 mL/min/{1.73_m2} (ref >59)
GFR calc non Af Amer: 52 mL/min/{1.73_m2} — ABNORMAL LOW (ref >59)
Glucose: 97 mg/dL (ref 65–99)
Potassium: 4.6 mmol/L (ref 3.5–5.2)
Sodium: 138 mmol/L (ref 134–144)

## 2019-12-13 LAB — MAGNESIUM: Magnesium: 1.9 mg/dL (ref 1.6–2.3)

## 2019-12-13 NOTE — Telephone Encounter (Signed)
Pt had ov with Dr.Kelly 9/9- discussed there.

## 2019-12-14 ENCOUNTER — Encounter: Payer: Self-pay | Admitting: Cardiovascular Disease

## 2019-12-16 DIAGNOSIS — Z1159 Encounter for screening for other viral diseases: Secondary | ICD-10-CM | POA: Diagnosis not present

## 2019-12-18 DIAGNOSIS — R609 Edema, unspecified: Secondary | ICD-10-CM | POA: Diagnosis not present

## 2019-12-18 DIAGNOSIS — I10 Essential (primary) hypertension: Secondary | ICD-10-CM | POA: Diagnosis not present

## 2019-12-20 DIAGNOSIS — S63114A Dislocation of metacarpophalangeal joint of right thumb, initial encounter: Secondary | ICD-10-CM | POA: Diagnosis not present

## 2019-12-20 DIAGNOSIS — M13841 Other specified arthritis, right hand: Secondary | ICD-10-CM | POA: Diagnosis not present

## 2019-12-23 DIAGNOSIS — Z1159 Encounter for screening for other viral diseases: Secondary | ICD-10-CM | POA: Diagnosis not present

## 2019-12-30 DIAGNOSIS — Z1159 Encounter for screening for other viral diseases: Secondary | ICD-10-CM | POA: Diagnosis not present

## 2020-01-13 DIAGNOSIS — Z1159 Encounter for screening for other viral diseases: Secondary | ICD-10-CM | POA: Diagnosis not present

## 2020-01-14 DIAGNOSIS — S63114A Dislocation of metacarpophalangeal joint of right thumb, initial encounter: Secondary | ICD-10-CM | POA: Diagnosis not present

## 2020-01-20 DIAGNOSIS — Z1159 Encounter for screening for other viral diseases: Secondary | ICD-10-CM | POA: Diagnosis not present

## 2020-01-27 DIAGNOSIS — Z1159 Encounter for screening for other viral diseases: Secondary | ICD-10-CM | POA: Diagnosis not present

## 2020-02-03 DIAGNOSIS — Z1159 Encounter for screening for other viral diseases: Secondary | ICD-10-CM | POA: Diagnosis not present

## 2020-02-10 DIAGNOSIS — Z1159 Encounter for screening for other viral diseases: Secondary | ICD-10-CM | POA: Diagnosis not present

## 2020-02-13 DIAGNOSIS — Z1159 Encounter for screening for other viral diseases: Secondary | ICD-10-CM | POA: Diagnosis not present

## 2020-02-17 DIAGNOSIS — Z1159 Encounter for screening for other viral diseases: Secondary | ICD-10-CM | POA: Diagnosis not present

## 2020-02-24 DIAGNOSIS — Z1159 Encounter for screening for other viral diseases: Secondary | ICD-10-CM | POA: Diagnosis not present

## 2020-03-02 DIAGNOSIS — Z1159 Encounter for screening for other viral diseases: Secondary | ICD-10-CM | POA: Diagnosis not present

## 2020-03-05 ENCOUNTER — Encounter: Payer: Self-pay | Admitting: Cardiovascular Disease

## 2020-03-05 ENCOUNTER — Ambulatory Visit: Payer: Medicare Other | Admitting: Cardiovascular Disease

## 2020-03-05 ENCOUNTER — Other Ambulatory Visit: Payer: Self-pay

## 2020-03-05 VITALS — BP 129/60 | HR 61 | Ht 59.0 in | Wt 140.0 lb

## 2020-03-05 DIAGNOSIS — I491 Atrial premature depolarization: Secondary | ICD-10-CM | POA: Diagnosis not present

## 2020-03-05 MED ORDER — HYDROCHLOROTHIAZIDE 12.5 MG PO TABS
ORAL_TABLET | ORAL | 3 refills | Status: DC
Start: 2020-03-05 — End: 2020-11-23

## 2020-03-05 NOTE — Progress Notes (Signed)
Patient ID: Valerie Barker, female   DOB: 16-Oct-1922, 86 y.o.   MRN: [id number]     HPI: Valerie Barker is a 86 y.o. female who presents to the office for a 3 month cardiology followup evaluation.  Valerie Barker is the wife of my former patient Dr. Buckner Malta who died on 04/11/15.  Remotely, Valerie Barker suffered a TIA and has documented old asymptomatic lacunar infarcts. Prior to undergoing urologic surgery at [facility] in 2012 a nuclear perfusion study showed normal perfusion. An echo Doppler study demonstrated mild to moderate TR, trace MR, and mild aortic sclerosis without stenosis. She had normal systolic function. Carotid studies done in 2011 were normal.  She was  hospitalized for 2 nights after experiencing an TIA on 01/08/2013. Her symptoms resolved spontaneously. A CT of her head showed mild atrophy with extensive supratentorial small vessel disease without intracranial mass, hemorrhage or acute appearing infarction. An MRI of her brain did not show any acute intracranial findings but did show advanced atrophy with chronic microvascular ischemic changes. MRA did not reveal any intracranial flow reducing lesions. During that hospitalization a f/u echo Doppler study  on 01/29/2013 showed an ejection fraction at 65%. There was mild aortic sclerosis without stenosis. Carotid Doppler study showed minimal plaque with less than 39% reduction. She  was sent home on atorvastatin 10 mg Plavix 75 mg in addition to her valsartan HCT 320/12.5 and zolpidem.  She has had issues with swelling of legs and feet.  She also has continued issues with restless legs.  She has been taking requip 0.5 mg shortly before going to bed, but it takes a while for the medication to work and usually does not completely suffice.  At times she has taken a half a pill if she is to have a daytime nap.  When I last saw her, she was complaining of experiencing a "bobble" sensation.  She underwent carotid duplex imaging  which was normal in November 2016.  She denies any episodes of chest pain.  She is unaware of palpitations.  She admits to being fatigued , but actually feels improved from previously.  At times if she is on her back for certain duration.  She does note transient right arm paresthesias.  She denies associated chest pressure.  She presents for evaluation.  She underwent extensive dental work with her dentist, Chief Financial Officer, and endodontist.  She also had eyelid lift surgery at  [facility] in December 2017 and tolerated this well from a cardiovascular standpoint.  When I last saw she felt well was experiencing significant tiredness and fatigue.  At times she noted  some dizziness with movement.  She has restless legs for which she is on requip in addition to gabapentin.  She denied chest pain.  Was unaware of palpitations.  I  saw her in May 2019 following an episode of dizziness.  As she was getting off the toilet and was trying to get or toilet paper she bent over into the cabinet and when she stood up she became dizzy lightheaded fell backwards and hit her head.  Since she was on chronic Plavix therapy she presented to the emergency room for evaluation.  No acute abnormality was noted.  However, her pulse in the emergency room was 52 and blood pressure 107/41.  She has been on losartan HCT 100/25 mg, Toprol-XL 12.5 mg and this has been held if her heart rate is less than 55.  I reviewed her emergency room evaluation.  At that time, I elected to wean and discontinue her very low-dose Toprol-XL 12.5 mg.    I saw her in September 2019. Unfortunately her 32 year old sister passed away 4 weeks ago.  She had spoken to her sister on a daily basis.  Valerie Barker continues to live in independent living at [facility].  She denies chest pain.  She is unaware of palpitations.  She has not required use of HCTZ.  Continues to be on valsartan 320 mg for hypertension.  She is tolerating atorvastatin for  hyperlipidemia.   She has continued to live in independent living at [facility].  I last evaluated her in a telemedicine visit in January 2021.  At that time she denied  any chest pain or shortness of breath or palpitations.  She admits to significant fatigability.  She believes she is sleeping a lot of the time.  She does sleep well at night.  She has not been as active as she had in the past due to the COVID-19 pandemic.  She was concerned about the possibility of early Alzheimer's disease since she is starting to notice some short-term memory loss.  She also admits to swelling in her right foot and was given HCTZ 25 mg by Dr. Shelia Media..   She was evaluated by Almyra Deforest, PA in April 2021.  At that time she was taking HCTZ 25 mg daily rather than as needed as result of leg swelling.  He was concerned that her dependent edema was due to venous stasis and recommended she change this back to just as needed.  He scheduled her for an echo Doppler study which was done on 08/11/2019 which showed an EF of 60 to 65%.  There is grade 1 diastolic dysfunction.  There were no wall motion abnormalities.  She had mildly increased RV systolic pressure 35 mg.  There was mild aortic sclerosis without stenosis.     She was evaluated by Dr. Maureen Chatters of [facility] neurology.  She had previously seen Dr. Leta Baptist and has been on treatment for restless leg syndrome.  She was evaluated for memory issues but due to complaints of significant fatigability and sleepiness she was referred for a sleep study at their office which was done on 11/03/2019.  This apparently showed mild overall sleep apnea with an AHI of 10.5/h.  AHI during REM sleep was 0.  There was concern that she may have had heart rate irregularity throughout the test and her cyclic apnea seem to correlate with her irregular heart rate and rhythm.  There was concern of whether or not she had atrial fibrillation and cardiology evaluation was advised.  She was  evaluated in the  emergency room after a mechanical fall when she was walking with her walker and tripped between the transition of the hardwood floor on carpet.  She sustained injury to her right thumb and apparently is now wearing a thumb splint for small fracture.  I last saw her in December 2021 at which time she denied any chest pain or shortness of breath.  She admits to fatigue and daytime sleepiness.  She was taking hydrochlorothiazide daily, olmesartan 40 mg, and requipfor her restless legs.  She also is on atorvastatin for hyperlipidemia and levothyroxine for hypothyroidism.  She is unaware of any palpitations.  Of note, in the past when very low-dose beta-blocker therapy was instituted she did get bradycardic in the low 50s.  During that evaluation, I reviewed her sleep study and felt she most likely had frequent  PACs.  Over the past several months, she has continued to live in independent living at [facility].  She denies chest pain or shortness of breath.  She does admit to some feet and ankle swelling.  She denies any chest pain or shortness of breath.  She still admits to being tired.  She presents for evaluation.   Past Medical History:  Diagnosis Date  . Heart disease   . Hypertension   . IBS (irritable bowel syndrome)   . Stroke ([place]) 04/17/2018   around 2011  . TIA (transient ischemic attack)     Past Surgical History:  Procedure Laterality Date  . ABDOMINAL HYSTERECTOMY  1978  . BUNIONECTOMY  2006, 1996  . LAPAROSCOPIC SALPINGO OOPHERECTOMY  1988    Allergies  Allergen Reactions  . Duloxetine Other (See Comments)    unknown  . Erythromycin   . Mirtazapine     Other reaction(s): Other (See Comments) unknown  . Trazodone     Other reaction(s): Other (See Comments) unknown  . Zolpidem     Other reaction(s): Other (See Comments) unknown  . Cyclobenzaprine Anxiety    Unknown  . Diazepam Rash    unknown    Current Outpatient Medications  Medication Sig Dispense Refill  .  atorvastatin (LIPITOR) 10 MG tablet TAKE 1/2 TABLET EVERY DAY. 15 tablet 5  . Biotin 10000 MCG TABS Take 10,000 mcg by mouth every evening.     . Calcium Carbonate-Vitamin D (CALCIUM-D PO) Take 1 tablet by mouth every evening. 1200/500    . Cholecalciferol (VITAMIN D) 2000 UNITS CAPS Take 2,000 Units by mouth every morning.     . clopidogrel (PLAVIX) 75 MG tablet Take 75 mg by mouth daily.    . Cyanocobalamin (VITAMIN B-12 PO) Take 2,500 mcg by mouth every morning.     . hydrochlorothiazide (HYDRODIURIL) 12.5 MG tablet Alternate taking 1 tablet (12.5 mg) on one day, and 2 tablets (25 mg) the next. 90 tablet 3  . levothyroxine (SYNTHROID) 25 MCG tablet Take 25 mcg by mouth daily before breakfast.     . Multiple Vitamin (MULTIVITAMIN) capsule Take 1 capsule by mouth every morning.     . olmesartan (BENICAR) 40 MG tablet Take 40 mg by mouth daily.     . penicillin v potassium (VEETID) 500 MG tablet Take 3 tablets by mouth daily.    Marland Kitchen rOPINIRole (REQUIP) 1 MG tablet Take 1-2 mg by mouth See admin instructions. Takes 1 at evening, 2 at bedtime     No current facility-administered medications for this visit.    Social History   Socioeconomic History  . Marital status: Married    Spouse name: Not on file  . Number of children: Not on file  . Years of education: Not on file  . Highest education level: Not on file  Occupational History  . Not on file  Tobacco Use  . Smoking status: Never Smoker  . Smokeless tobacco: Never Used  Vaping Use  . Vaping Use: Never used  Substance and Sexual Activity  . Alcohol use: No  . Drug use: No  . Sexual activity: Not on file  Other Topics Concern  . Not on file  Social History Narrative   Lives in [place] at [place].  Education BA degree.  Retired.  3 children.  Son, Davison.  Caffeine 1=2 cups daily.   Social Determinants of Health   Financial Resource Strain:   . Difficulty of Paying Living Expenses: Not on file  Food Insecurity:    . Worried About Charity fundraiser in the Last Year: Not on file  . Ran Out of Food in the Last Year: Not on file  Transportation Needs:   . Lack of Transportation (Medical): Not on file  . Lack of Transportation (Non-Medical): Not on file  Physical Activity:   . Days of Exercise per Week: Not on file  . Minutes of Exercise per Session: Not on file  Stress:   . Feeling of Stress : Not on file  Social Connections:   . Frequency of Communication with Friends and Family: Not on file  . Frequency of Social Gatherings with Friends and Family: Not on file  . Attends Religious Services: Not on file  . Active Member of Clubs or Organizations: Not on file  . Attends Archivist Meetings: Not on file  . Marital Status: Not on file  Intimate Partner Violence:   . Fear of Current or Ex-Partner: Not on file  . Emotionally Abused: Not on file  . Physically Abused: Not on file  . Sexually Abused: Not on file   Socially she is widowed as of December 2016.  Her husband was a former Scientist, physiological of education that [employer] and developed progressive dementia Prior to his death  Family History  Problem Relation Age of Onset  . COPD Mother   . Schizophrenia Brother   . Stroke Father   . Breast cancer Neg Hx    ROS General: Negative; No fevers, chills, or night sweats;  HEENT: Negative; No changes in vision or hearing, sinus congestion, difficulty swallowing Pulmonary: Negative; No cough, wheezing, shortness of breath, hemoptysis Cardiovascular: Negative; No chest pain, presyncope, syncope, palpitations Positive for leg swelling  GI: Negative; No nausea, vomiting, diarrhea, or abdominal pain GU: Positive for recurrent urinary incontinence despite her prior urologic surgery; No dysuria, hematuria, or difficulty voiding Musculoskeletal: Right thumb fracture Hematologic/Oncology: Negative; no easy bruising, bleeding Endocrine: Negative; no heat/cold intolerance; no diabetes Neuro:  Positive for decreased short-term memory and history of TIA; Episode of right arm numbness in July 2017; no changes in balance, headaches Skin: Negative; No rashes or skin lesions Psychiatric: Negative; No behavioral problems, depression Sleep: Positive for restless leg syndrome; positive for daytime sleepiness no snoring,  bruxism, hypnogognic hallucinations, no cataplexy Other comprehensive 14 point system review is negative.   PE BP 129/60 (BP Location: Left Arm, Patient Position: Sitting)   Pulse 61   Ht $R'4\' 11"'tE$  (1.499 m)   Wt 140 lb (63.5 kg)   SpO2 (!) 61%   BMI 28.28 kg/m    Repeat blood pressure by me was 132/76  Wt Readings from Last 3 Encounters:  03/05/20 140 lb (63.5 kg)  12/12/19 143 lb (64.9 kg)  10/09/19 143 lb (64.9 kg)   General: Alert, oriented, no distress.  Young appearing 86 year old. Skin: normal turgor, no rashes, warm and dry HEENT: Normocephalic, atraumatic. Pupils equal round and reactive to light; sclera anicteric; extraocular muscles intact;  Nose without nasal septal hypertrophy Mouth/Parynx benign; Mallinpatti scale 3 Neck: No JVD, no carotid bruits; normal carotid upstroke Lungs: clear to ausculatation and percussion; no wheezing or rales Chest wall: without tenderness to palpitation Heart: PMI not displaced, RRR, s1 s2 normal, 1/6 systolic murmur, no diastolic murmur, no rubs, gallops, thrills, or heaves Abdomen: soft, nontender; no hepatosplenomehaly, BS+; abdominal aorta nontender and not dilated by palpation. Back: no CVA tenderness Pulses 2+ Musculoskeletal: full range of motion, normal strength, no joint deformities Extremities:  no clubbing cyanosis or edema, Homan's sign negative  Neurologic: grossly nonfocal; Cranial nerves grossly wnl Psychologic: Normal mood and affect   ECG (independently read by me): NSR at 16, PAC in transient trigeminal pattern  September 2021 ECG (independently read by me): Sinus rhythm with frequent PACs, QTc  interval 412 ms.  No ST segment changes   September 2019 ECG (independently read by me): Normal sinus rhythm at 61 bpm.  No ectopy.  Normal intervals.  September 2018 ECG (independently read by me): sinus bradycardia 53 bpm.  Normal intervals.  No ST segment changes.  Decreased voltage anterolaterally  January 2018 ECG (independently read by me): Sinus bradycardia 57 bpm.  No ST segment changes.  There was malpositioning of her anterior leads.  May 2017 ECG (independently read by me): Sinus bradycardia 53 bpm.  No ectopy.  Normal intervals.  No ST segment changes.  November 2016 ECG (independently read by me): Sinus bradycardia 58 bpm.  Normal intervals.    October 2015 ECG (and apparently read by me) : Sinus rhythm with frequent PACs and transient atrial bigeminal pattern  Prior November 2014 ECG: Sinus rhythm with occasional PACs. Intervals are normal. Nonspecific T changes.  LABS: BMP Latest Ref Rng & Units 12/12/2019 07/12/2019 04/19/2018  Glucose 65 - 99 mg/dL 97 82 104(H)  BUN 10 - 36 mg/dL $Remove'18 23 13  'CllyxGo$ Creatinine 0.57 - 1.00 mg/dL 0.94 0.97 0.90  BUN/Creat Ratio 12 - $Re'28 19 24 'Khk$ -  Sodium 134 - 144 mmol/L 138 140 138  Potassium 3.5 - 5.2 mmol/L 4.6 4.6 3.8  Chloride 96 - 106 mmol/L 100 102 105  CO2 20 - 29 mmol/L $RemoveB'27 25 24  'upjQkIlv$ Calcium 8.7 - 10.3 mg/dL 10.3 9.2 8.9   Hepatic Function Latest Ref Rng & Units 04/19/2018 04/18/2018 04/17/2018  Total Protein 6.5 - 8.1 g/dL 5.7(L) 5.9(L) 6.1(L)  Albumin 3.5 - 5.0 g/dL 3.3(L) 3.6 3.9  AST 15 - 41 U/L $Remo'25 25 23  'oLvNv$ ALT 0 - 44 U/L $Remo'20 21 21  'CJwUv$ Alk Phosphatase 38 - 126 U/L 42 47 51  Total Bilirubin 0.3 - 1.2 mg/dL 0.8 0.8 0.8  Bilirubin, Direct 0.0 - 0.3 mg/dL - - -   CBC Latest Ref Rng & Units 07/12/2019 04/19/2018 04/18/2018  WBC 3.4 - 10.8 x10E3/uL 7.2 6.6 8.2  Hemoglobin 11.1 - 15.9 g/dL 12.7 11.6(L) 12.1  Hematocrit 34.0 - 46.6 % 38.2 35.4(L) 36.7  Platelets 150 - 450 x10E3/uL 190 164 184   Lab Results  Component Value Date   TSH 5.800 (H)  07/12/2019     Lipid Panel     Component Value Date/Time   CHOL 128 04/18/2018 0458   TRIG 67 04/18/2018 0458   HDL 67 04/18/2018 0458   CHOLHDL 1.9 04/18/2018 0458   VLDL 13 04/18/2018 0458   LDLCALC 48 04/18/2018 0458     RADIOLOGY: Ct Head (brain) Wo Contrast  01/28/2013   CLINICAL DATA:  Right upper extremity weakness and numbness  EXAM: CT HEAD WITHOUT CONTRAST  TECHNIQUE: Contiguous axial images were obtained from the base of the skull through the vertex without intravenous contrast. Study was obtained within 24 hr of patient's arrival at the emergency department.  COMPARISON:  Brain MRI April 27, 2009 and brain CT April 27, 2009  FINDINGS: There is mild diffuse atrophy. There is no mass, hemorrhage, extra-axial fluid collection, or midline shift. There is extensive small vessel disease throughout the centra semiovale bilaterally, a stable finding. There is no new gray-white compartment  lesion. There is no demonstrable acute infarct. Bony calvarium appears intact. The mastoid air cells are clear.  IMPRESSION: Mild atrophy with extensive supratentorial small vessel disease. No intracranial mass, hemorrhage, or acute appearing infarct.   Electronically Signed   By: Bretta Bang M.D.   On: 01/28/2013 14:42   Mr Brain Wo Contrast  01/28/2013   CLINICAL DATA:  Numbness in the arms. Dry hacking cough. History of hypertension and dyslipidemia.  EXAM: MRI HEAD WITHOUT CONTRAST  MRA HEAD WITHOUT CONTRAST  TECHNIQUE: Multiplanar, multiecho pulse sequences of the brain and surrounding structures were obtained without intravenous contrast. Angiographic images of the head were obtained using MRA technique without contrast.  COMPARISON:  CT 01/28/2013.  FINDINGS: MRI HEAD FINDINGS  The patient was unable to remain motionless for the exam. Small or subtle lesions could be overlooked.  No evidence for acute infarction, hemorrhage, mass lesion, hydrocephalus, or extra-axial fluid. Moderate  age-related atrophy. Extensive chronic microvascular ischemic change. Basal ganglia mineralization but no foci of chronic hemorrhage. Flow voids are maintained. No osseous findings. No remote large vessel infarct. Bilateral cataract extraction. No acute sinus or mastoid fluid. Good general agreement with prior CT.  MRA HEAD FINDINGS  Grossly patent internal carotid arteries, and basilar artery. Vertebrals are codominant. No flow-limiting intracranial stenosis, branch occlusion, or aneurysm is evident on this motion degraded exam.  IMPRESSION: MRI HEAD IMPRESSION  No acute intracranial findings are evident. There is advanced atrophy with chronic microvascular ischemic change.  MRA HEAD IMPRESSION  No intracranial flow reducing lesion is evident.   Electronically Signed   By: Davonna Belling M.D.   On: 01/28/2013 18:47   Mr Maxine Glenn Head/brain Wo Cm  01/28/2013   CLINICAL DATA:  Numbness in the arms. Dry hacking cough. History of hypertension and dyslipidemia.  EXAM: MRI HEAD WITHOUT CONTRAST  MRA HEAD WITHOUT CONTRAST  TECHNIQUE: Multiplanar, multiecho pulse sequences of the brain and surrounding structures were obtained without intravenous contrast. Angiographic images of the head were obtained using MRA technique without contrast.  COMPARISON:  CT 01/28/2013.  FINDINGS: MRI HEAD FINDINGS  The patient was unable to remain motionless for the exam. Small or subtle lesions could be overlooked.  No evidence for acute infarction, hemorrhage, mass lesion, hydrocephalus, or extra-axial fluid. Moderate age-related atrophy. Extensive chronic microvascular ischemic change. Basal ganglia mineralization but no foci of chronic hemorrhage. Flow voids are maintained. No osseous findings. No remote large vessel infarct. Bilateral cataract extraction. No acute sinus or mastoid fluid. Good general agreement with prior CT.  MRA HEAD FINDINGS  Grossly patent internal carotid arteries, and basilar artery. Vertebrals are codominant. No  flow-limiting intracranial stenosis, branch occlusion, or aneurysm is evident on this motion degraded exam.  IMPRESSION: MRI HEAD IMPRESSION  No acute intracranial findings are evident. There is advanced atrophy with chronic microvascular ischemic change.  MRA HEAD IMPRESSION  No intracranial flow reducing lesion is evident.   Electronically Signed   By: Davonna Belling M.D.   On: 01/28/2013 18:47   IMPRESSION:  1. Premature atrial contractions     ASSESSMENT AND PLAN: Ms. Vasbinder is a young appearing 86 year old Caucasian female who has a history of prior TIA's and remotely had been  diagnosed as having a TIA with old asymptomatic lacunar infarct. On CT, MRI and MRA studies  significant small vessel disease of her brain had been demonstrated.  She has normal systolic function and evidence for aortic sclerosis on echo assessment. She has been Plavix with her TIA  history.  At a previous evaluation, she had developed significant sinus bradycardia and ultimately her low-dose Toprol-XL 12.5 mg was weaned and DC'd.  At follow-up evaluation resting pulse was in the 60s.  Apparently she has been taking hydrochlorothiazide and  had a fall.  She did not have any presyncope or syncope in the fall was due to tripping over the carpet with her walker on the transition zone  between the hardwood floor.  On her most recent echo Doppler study she continued to have hyperdynamic LV function with mild grade 1 diastolic dysfunction and mild aortic sclerosis without stenosis.   Her sleep study revealed mild overall sleep apnea and there was concern of heart rate irregularity which was associated with some of her apnea cycles.  When I last saw her she did have PACs noted on ECG and today her ECG also shows some occasional PACs and a transient trigeminal pattern.  In the past she developed significant bradycardia with low-dose beta-blocker therapy.  Presently she has noticed increasing feet and ankle swelling despite taking  hydrochlorothiazide 12.5 mg daily.  I have suggested for the next 3 days she increase her dose to 25 mg and then after that depending upon leg swelling changed to 12.5 mg alternating with 25 mg every other day or every third day as needed.  She continues to be on olmesartan 40 mg for additional blood pressure control and her blood pressure today is stable.  She is on low-dose levothyroxine at 25 mcg.  She continues to be on Plavix following her history of TIA and is on record for her restless legs.  She continues to be on very low-dose atorvastatin at 5 mg.  She sees Dr. Deland Pretty for primary care.  I will see her in 6 months for follow-up evaluation.   Troy Sine, MD, [facility]  03/07/2020 3:01 PM

## 2020-03-05 NOTE — Patient Instructions (Signed)
Medication Instructions:  INCREASE your HCTZ to 25 mg (2 pills) per day for 3 DAYs to reduce your swelling. After 3 days, alternate between 1 tablet (12.5 mg) one day and 2 tablets (25 mg) the next. *If you need a refill on your cardiac medications before your next appointment, please call your pharmacy*   Lab Work: None ordered If you have labs (blood work) drawn today and your tests are completely normal, you will receive your results only by: Marland Kitchen MyChart Message (if you have MyChart) OR . A paper copy in the mail If you have any lab test that is abnormal or we need to change your treatment, we will call you to review the results.   Testing/Procedures: None ordered   Follow-Up: At [facility], you and your health needs are our priority.  As part of our continuing mission to provide you with exceptional heart care, we have created designated Provider Care Teams.  These Care Teams include your primary Cardiologist (physician) and Advanced Practice Providers (APPs -  Physician Assistants and Nurse Practitioners) who all work together to provide you with the care you need, when you need it.  We recommend signing up for the patient portal called "MyChart".  Sign up information is provided on this After Visit Summary.  MyChart is used to connect with patients for Virtual Visits (Telemedicine).  Patients are able to view lab/test results, encounter notes, upcoming appointments, etc.  Non-urgent messages can be sent to your provider as well.   To learn more about what you can do with MyChart, go to [email or web address].    Your next appointment:   6 month(s)  The format for your next appointment:   In Person  Provider:   Shelva Majestic, MD   Your physician wants you to follow-up in: 6 months. You will receive a reminder letter in the mail two months in advance. If you don't receive a letter, please call our office to schedule the follow-up appointment.    Other Instructions Look into  getting some below-the-knee support hose to manage leg swelling

## 2020-03-07 ENCOUNTER — Encounter: Payer: Self-pay | Admitting: Cardiovascular Disease

## 2020-03-09 DIAGNOSIS — Z1159 Encounter for screening for other viral diseases: Secondary | ICD-10-CM | POA: Diagnosis not present

## 2020-03-16 DIAGNOSIS — Z1159 Encounter for screening for other viral diseases: Secondary | ICD-10-CM | POA: Diagnosis not present

## 2020-03-23 DIAGNOSIS — S63114A Dislocation of metacarpophalangeal joint of right thumb, initial encounter: Secondary | ICD-10-CM | POA: Diagnosis not present

## 2020-03-23 DIAGNOSIS — Z1159 Encounter for screening for other viral diseases: Secondary | ICD-10-CM | POA: Diagnosis not present

## 2020-04-02 DIAGNOSIS — Z1159 Encounter for screening for other viral diseases: Secondary | ICD-10-CM | POA: Diagnosis not present

## 2020-04-06 DIAGNOSIS — Z1159 Encounter for screening for other viral diseases: Secondary | ICD-10-CM | POA: Diagnosis not present

## 2020-04-13 DIAGNOSIS — Z1159 Encounter for screening for other viral diseases: Secondary | ICD-10-CM | POA: Diagnosis not present

## 2020-04-20 DIAGNOSIS — Z1159 Encounter for screening for other viral diseases: Secondary | ICD-10-CM | POA: Diagnosis not present

## 2020-04-27 DIAGNOSIS — Z1159 Encounter for screening for other viral diseases: Secondary | ICD-10-CM | POA: Diagnosis not present

## 2020-04-30 DIAGNOSIS — Z1159 Encounter for screening for other viral diseases: Secondary | ICD-10-CM | POA: Diagnosis not present

## 2020-05-04 DIAGNOSIS — Z1159 Encounter for screening for other viral diseases: Secondary | ICD-10-CM | POA: Diagnosis not present

## 2020-05-07 DIAGNOSIS — R609 Edema, unspecified: Secondary | ICD-10-CM | POA: Diagnosis not present

## 2020-05-07 DIAGNOSIS — R2681 Unsteadiness on feet: Secondary | ICD-10-CM | POA: Diagnosis not present

## 2020-05-07 DIAGNOSIS — Z8673 Personal history of transient ischemic attack (TIA), and cerebral infarction without residual deficits: Secondary | ICD-10-CM | POA: Diagnosis not present

## 2020-05-07 DIAGNOSIS — I10 Essential (primary) hypertension: Secondary | ICD-10-CM | POA: Diagnosis not present

## 2020-05-07 DIAGNOSIS — E039 Hypothyroidism, unspecified: Secondary | ICD-10-CM | POA: Diagnosis not present

## 2020-05-07 DIAGNOSIS — Z7901 Long term (current) use of anticoagulants: Secondary | ICD-10-CM | POA: Diagnosis not present

## 2020-05-07 DIAGNOSIS — E538 Deficiency of other specified B group vitamins: Secondary | ICD-10-CM | POA: Diagnosis not present

## 2020-05-11 DIAGNOSIS — Z1159 Encounter for screening for other viral diseases: Secondary | ICD-10-CM | POA: Diagnosis not present

## 2020-05-16 IMAGING — MR MR HEAD W/O CM
9 of 10 series · 36 of 48 positions shown · non-contrast
Comparison: Head CT 04/17/2018 and MRI 07/05/2017

CLINICAL DATA: Visual disturbance.  Left-sided numbness.

EXAM:
MRI HEAD WITHOUT CONTRAST
TECHNIQUE: Multiplanar, multiecho pulse sequences of the brain and surrounding
structures were obtained without intravenous contrast.

[Series 4: DWI · axial · 3.0mm · 0.94mm/px · z∈[-130,+11]mm · 9 of 98 slices shown (1 of 2)]
[im 1/98]
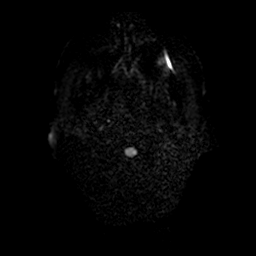
[im 13/98]
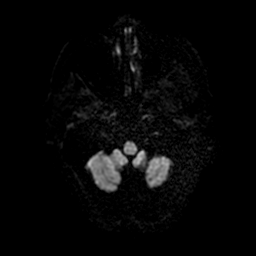
[im 25/98]
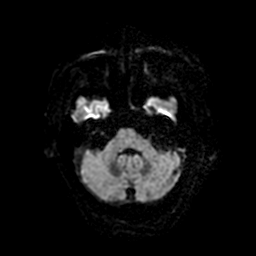
[im 37/98]
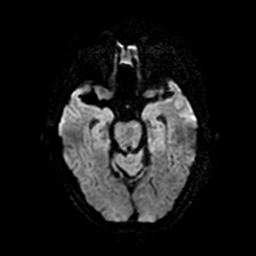
[im 49/98]
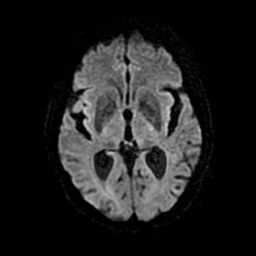
[im 61/98]
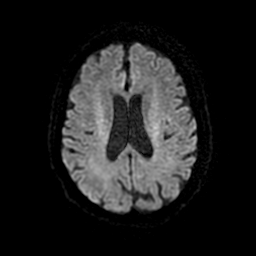
[im 73/98]
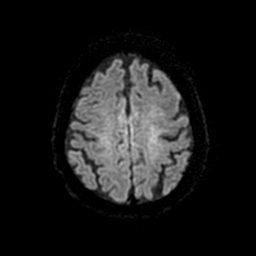
[im 85/98]
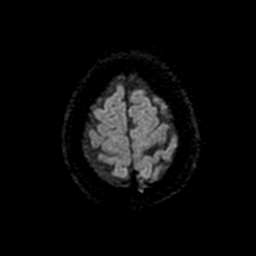
[im 98/98]
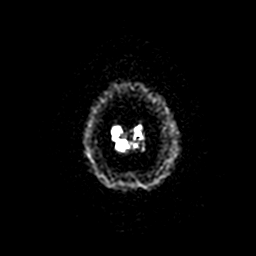

[Series 5: FLAIR · axial · 3.0mm · 0.47mm/px · z∈[-130,+11]mm · 2 of 25 slices shown (1 of 2)]
[im 1/25]
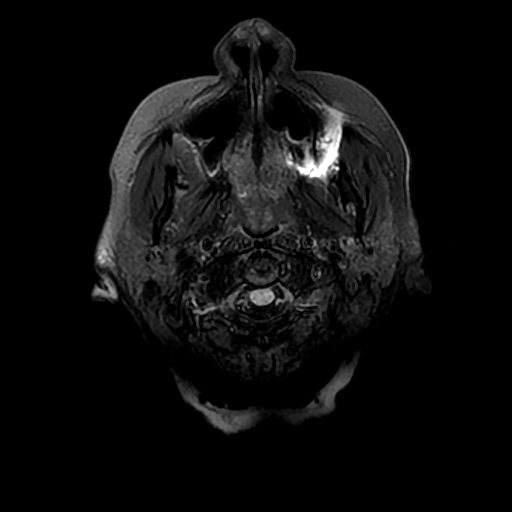
[im 25/25]
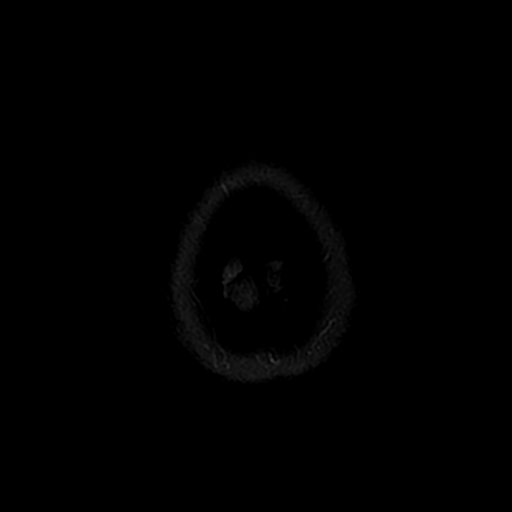

[Series 6: (person_name) · axial · 3.0mm · 0.47mm/px · z∈[-131,-83]mm · 3 of 100 slices shown]
[im 1/100]
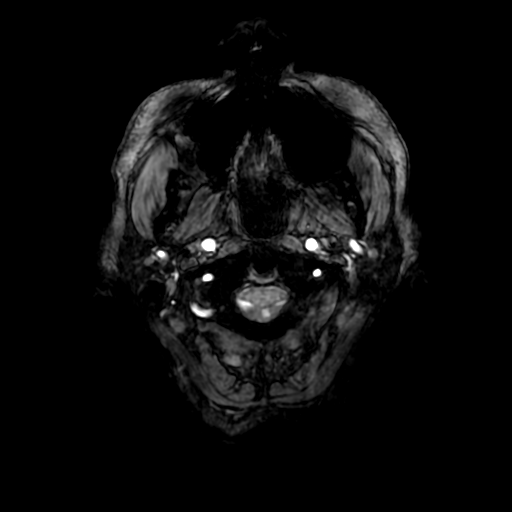
[im 12/100]
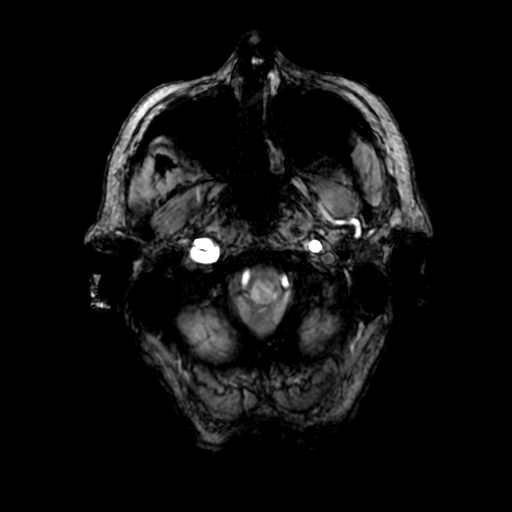
[im 34/100]
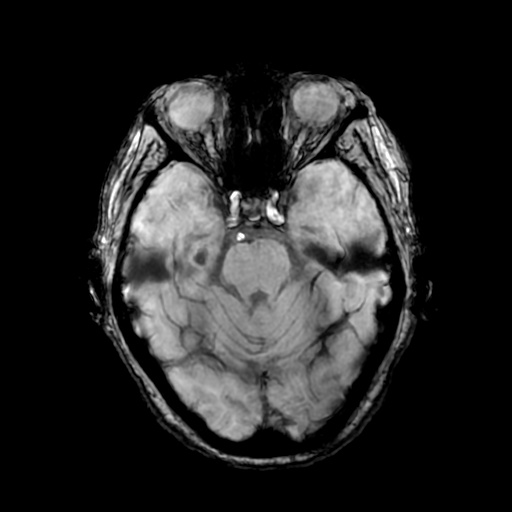

[Series 8: T2 · axial · 5.0mm · 0.47mm/px · z∈[-130,+11]mm · 2 of 25 slices shown (1 of 2)]
[im 1/25]
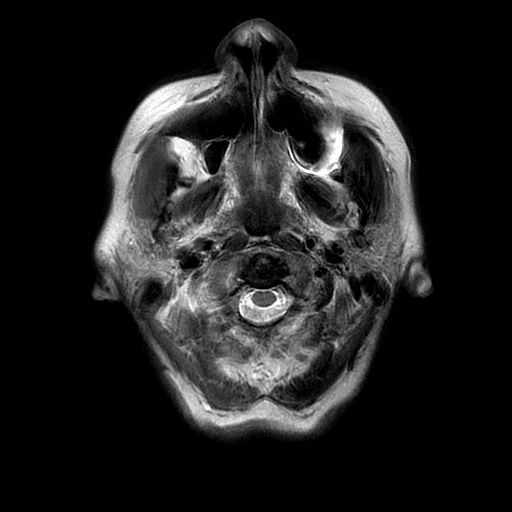
[im 25/25]
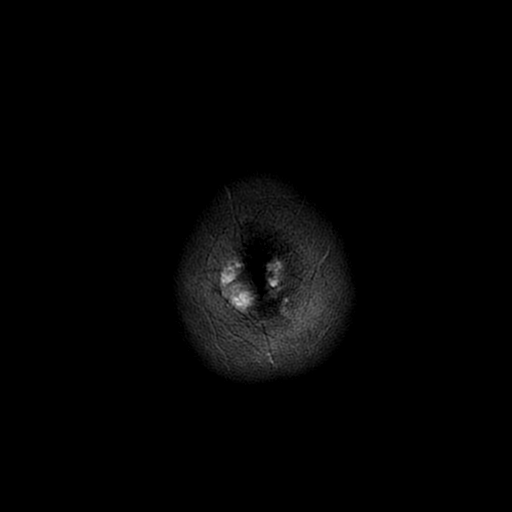

[Series 9: DWI · coronal · 4.0mm · 0.94mm/px · 7 of 70 slices shown (2 of 2)]
[im 1/70]
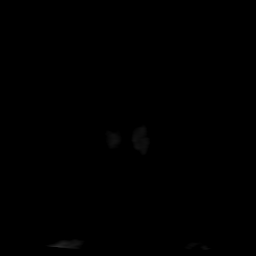
[im 12/70]
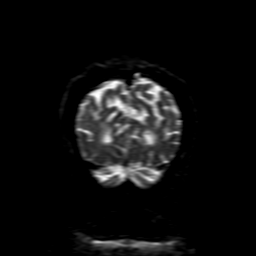
[im 24/70]
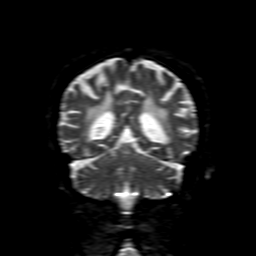
[im 35/70]
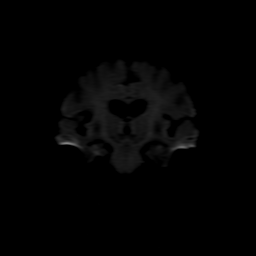
[im 47/70]
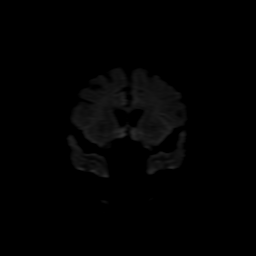
[im 58/70]
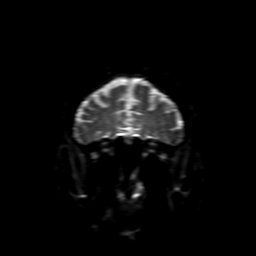
[im 70/70]
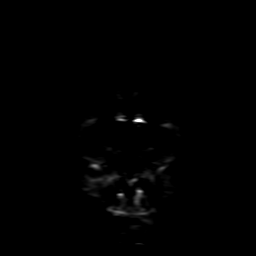

[Series 10: FLAIR · sagittal · 5.0mm · 0.47mm/px · 2 of 23 slices shown (2 of 2)]
[im 1/23]
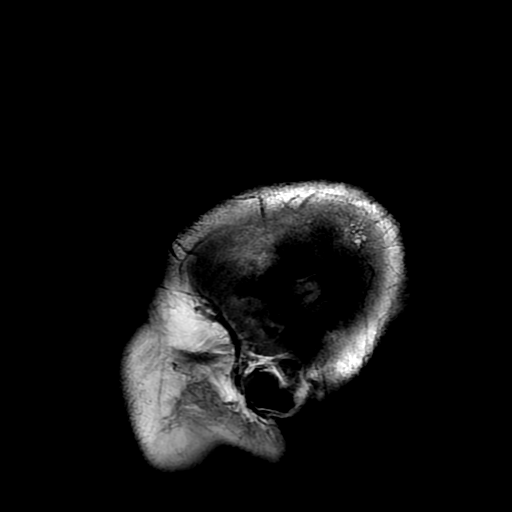
[im 23/23]
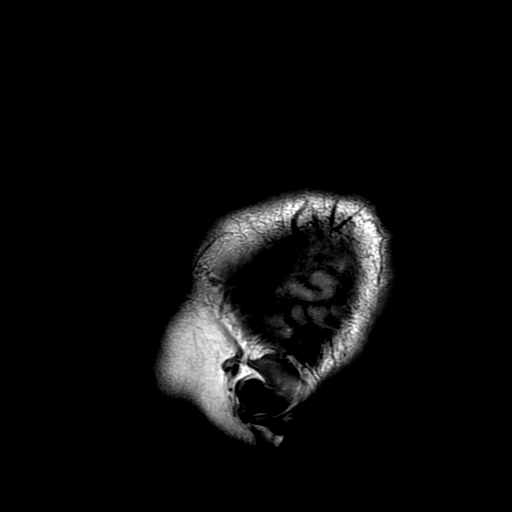

[Series 12: T2 · coronal · 5.0mm · 0.43mm/px · 3 of 29 slices shown (2 of 2)]
[im 1/29]
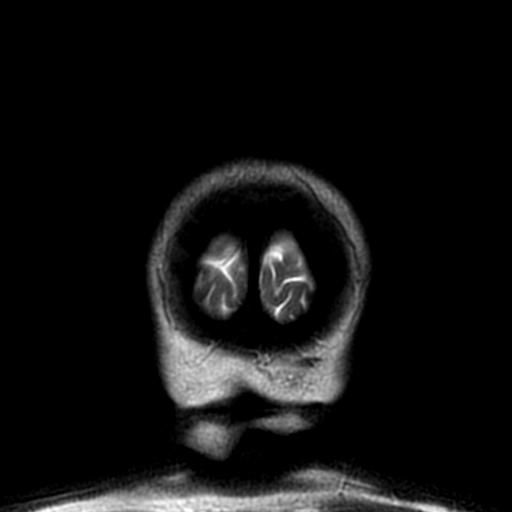
[im 15/29]
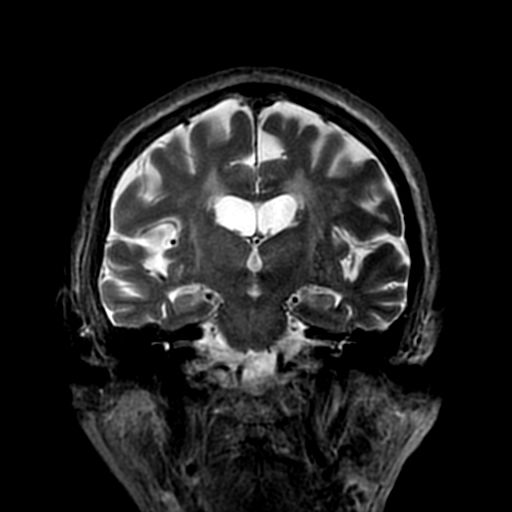
[im 29/29]
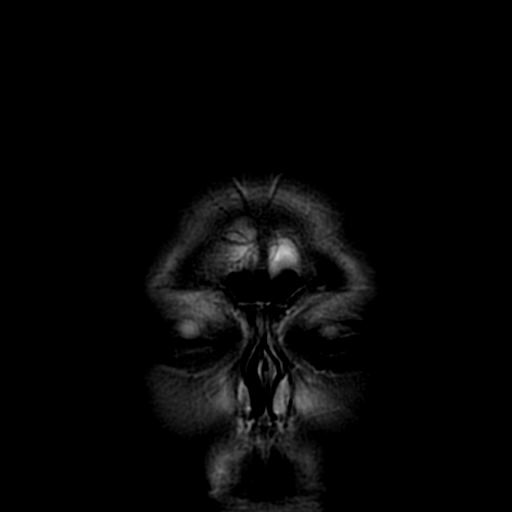

[Series 450: ADC · axial · 3.0mm · 0.94mm/px · z∈[-130,+11]mm · 5 of 49 slices shown (1 of 2)]
[im 1/49]
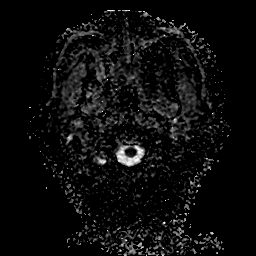
[im 13/49]
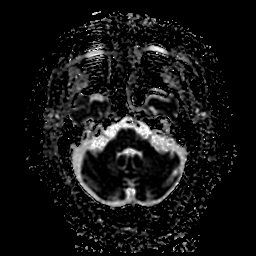
[im 25/49]
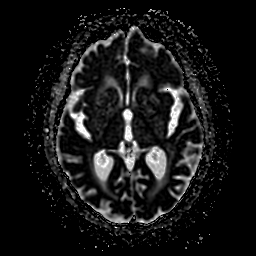
[im 37/49]
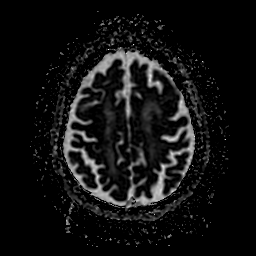
[im 49/49]
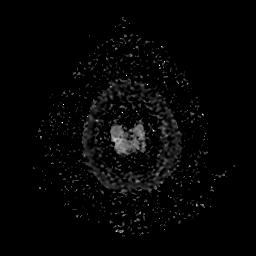

[Series 950: ADC · coronal · 4.0mm · 0.94mm/px · 3 of 35 slices shown (2 of 2)]
[im 1/35]
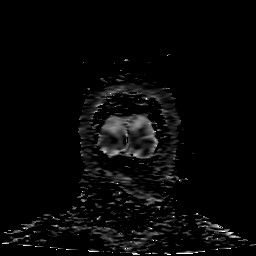
[im 18/35]
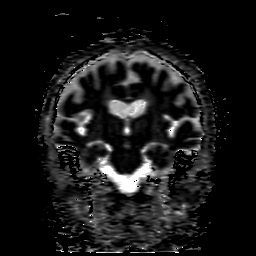
[im 35/35]
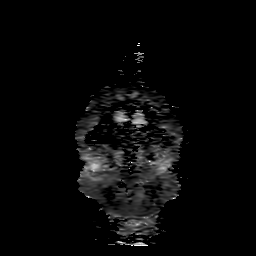

[36 of 48 positions shown; findings below may reference images not displayed]

FINDINGS: Brain: There is no evidence of acute infarct, mass, midline shift,
or extra-axial fluid collection. A few scattered chronic cerebral
microhemorrhages are noted. Patchy to confluent T2 hyperintensities
in the cerebral white matter and pons are unchanged from the prior
MRI and nonspecific but compatible with extensive chronic small
vessel ischemic disease. Cerebral atrophy is mild for age.

Vascular: Major intracranial vascular flow voids are preserved.

Skull and upper cervical spine: Unremarkable bone marrow signal.

Sinuses/Orbits: Bilateral cataract extraction. Small volume fluid in
the left sphenoid sinus. Clear mastoid air cells.

Other: None.
IMPRESSION: 1. No acute intracranial abnormality.
2. Extensive chronic small vessel ischemic disease.

## 2020-05-18 DIAGNOSIS — Z1159 Encounter for screening for other viral diseases: Secondary | ICD-10-CM | POA: Diagnosis not present

## 2020-05-24 ENCOUNTER — Other Ambulatory Visit: Payer: Self-pay | Admitting: Cardiovascular Disease

## 2020-05-25 DIAGNOSIS — Z1159 Encounter for screening for other viral diseases: Secondary | ICD-10-CM | POA: Diagnosis not present

## 2020-05-26 DIAGNOSIS — R2681 Unsteadiness on feet: Secondary | ICD-10-CM | POA: Diagnosis not present

## 2020-05-26 DIAGNOSIS — R2689 Other abnormalities of gait and mobility: Secondary | ICD-10-CM | POA: Diagnosis not present

## 2020-05-26 DIAGNOSIS — M542 Cervicalgia: Secondary | ICD-10-CM | POA: Diagnosis not present

## 2020-06-05 DIAGNOSIS — R2689 Other abnormalities of gait and mobility: Secondary | ICD-10-CM | POA: Diagnosis not present

## 2020-06-05 DIAGNOSIS — R2681 Unsteadiness on feet: Secondary | ICD-10-CM | POA: Diagnosis not present

## 2020-06-05 DIAGNOSIS — M542 Cervicalgia: Secondary | ICD-10-CM | POA: Diagnosis not present

## 2020-06-08 DIAGNOSIS — Z1159 Encounter for screening for other viral diseases: Secondary | ICD-10-CM | POA: Diagnosis not present

## 2020-06-09 DIAGNOSIS — R2689 Other abnormalities of gait and mobility: Secondary | ICD-10-CM | POA: Diagnosis not present

## 2020-06-09 DIAGNOSIS — M542 Cervicalgia: Secondary | ICD-10-CM | POA: Diagnosis not present

## 2020-06-09 DIAGNOSIS — R2681 Unsteadiness on feet: Secondary | ICD-10-CM | POA: Diagnosis not present

## 2020-06-11 ENCOUNTER — Telehealth: Payer: Self-pay | Admitting: Cardiovascular Disease

## 2020-06-11 DIAGNOSIS — M542 Cervicalgia: Secondary | ICD-10-CM | POA: Diagnosis not present

## 2020-06-11 DIAGNOSIS — R2689 Other abnormalities of gait and mobility: Secondary | ICD-10-CM | POA: Diagnosis not present

## 2020-06-11 DIAGNOSIS — R2681 Unsteadiness on feet: Secondary | ICD-10-CM | POA: Diagnosis not present

## 2020-06-11 NOTE — Telephone Encounter (Signed)
Spoke with physical therapist who state pt voiced she has been experiencing SOB for the past 6 week with exertions that's resolved with rest. He state pt only report mild swelling in both feet, but this isn't new. Current BP 170/90, O2 97% and he report pulse feels a little irregular.  Appointment scheduled for first available on 3/28. Nurse will forward message to Dr. Claiborne Billings to make aware but pt advised to report to ER if SOB worsens or experience symptoms of feeling like she is going to pass out. Per PT, pt verbalized understanding.

## 2020-06-12 DIAGNOSIS — R2689 Other abnormalities of gait and mobility: Secondary | ICD-10-CM | POA: Diagnosis not present

## 2020-06-12 DIAGNOSIS — M542 Cervicalgia: Secondary | ICD-10-CM | POA: Diagnosis not present

## 2020-06-12 DIAGNOSIS — R2681 Unsteadiness on feet: Secondary | ICD-10-CM | POA: Diagnosis not present

## 2020-06-15 DIAGNOSIS — Z1159 Encounter for screening for other viral diseases: Secondary | ICD-10-CM | POA: Diagnosis not present

## 2020-06-16 DIAGNOSIS — R2681 Unsteadiness on feet: Secondary | ICD-10-CM | POA: Diagnosis not present

## 2020-06-16 DIAGNOSIS — M542 Cervicalgia: Secondary | ICD-10-CM | POA: Diagnosis not present

## 2020-06-16 DIAGNOSIS — R2689 Other abnormalities of gait and mobility: Secondary | ICD-10-CM | POA: Diagnosis not present

## 2020-06-18 DIAGNOSIS — R2689 Other abnormalities of gait and mobility: Secondary | ICD-10-CM | POA: Diagnosis not present

## 2020-06-18 DIAGNOSIS — R2681 Unsteadiness on feet: Secondary | ICD-10-CM | POA: Diagnosis not present

## 2020-06-18 DIAGNOSIS — M542 Cervicalgia: Secondary | ICD-10-CM | POA: Diagnosis not present

## 2020-06-19 NOTE — Telephone Encounter (Signed)
Call pt and rescheduled appointment for next week on 3/23 with Dr. Claiborne Billings.

## 2020-06-19 NOTE — Telephone Encounter (Signed)
Try to see if patient can be seen sooner than her current schedule date.

## 2020-06-22 DIAGNOSIS — R2681 Unsteadiness on feet: Secondary | ICD-10-CM | POA: Diagnosis not present

## 2020-06-22 DIAGNOSIS — M542 Cervicalgia: Secondary | ICD-10-CM | POA: Diagnosis not present

## 2020-06-22 DIAGNOSIS — R2689 Other abnormalities of gait and mobility: Secondary | ICD-10-CM | POA: Diagnosis not present

## 2020-06-22 DIAGNOSIS — Z1159 Encounter for screening for other viral diseases: Secondary | ICD-10-CM | POA: Diagnosis not present

## 2020-06-24 ENCOUNTER — Encounter: Payer: Self-pay | Admitting: Cardiovascular Disease

## 2020-06-24 ENCOUNTER — Ambulatory Visit: Payer: Medicare Other | Admitting: Cardiovascular Disease

## 2020-06-24 ENCOUNTER — Other Ambulatory Visit: Payer: Self-pay

## 2020-06-24 DIAGNOSIS — I493 Ventricular premature depolarization: Secondary | ICD-10-CM

## 2020-06-24 DIAGNOSIS — G2581 Restless legs syndrome: Secondary | ICD-10-CM

## 2020-06-24 DIAGNOSIS — I10 Essential (primary) hypertension: Secondary | ICD-10-CM

## 2020-06-24 DIAGNOSIS — R0600 Dyspnea, unspecified: Secondary | ICD-10-CM

## 2020-06-24 DIAGNOSIS — E039 Hypothyroidism, unspecified: Secondary | ICD-10-CM | POA: Diagnosis not present

## 2020-06-24 DIAGNOSIS — M7989 Other specified soft tissue disorders: Secondary | ICD-10-CM | POA: Diagnosis not present

## 2020-06-24 DIAGNOSIS — Z8673 Personal history of transient ischemic attack (TIA), and cerebral infarction without residual deficits: Secondary | ICD-10-CM

## 2020-06-24 DIAGNOSIS — R0609 Other forms of dyspnea: Secondary | ICD-10-CM

## 2020-06-24 MED ORDER — OLMESARTAN MEDOXOMIL 20 MG PO TABS
20.0000 mg | ORAL_TABLET | Freq: Every day | ORAL | 3 refills | Status: DC
Start: 2020-06-24 — End: 2022-11-16

## 2020-06-24 NOTE — Patient Instructions (Addendum)
Medication Instructions:  DECREASE- Olmesartan 20 mg by mouth daily  *If you need a refill on your cardiac medications before your next appointment, please call your pharmacy*   Lab Work: None Ordered  Testing/Procedures: None Ordered   Follow-Up: At [facility], you and your health needs are our priority.  As part of our continuing mission to provide you with exceptional heart care, we have created designated Provider Care Teams.  These Care Teams include your primary Cardiologist (physician) and Advanced Practice Providers (APPs -  Physician Assistants and Nurse Practitioners) who all work together to provide you with the care you need, when you need it.  We recommend signing up for the patient portal called "MyChart".  Sign up information is provided on this After Visit Summary.  MyChart is used to connect with patients for Virtual Visits (Telemedicine).  Patients are able to view lab/test results, encounter notes, upcoming appointments, etc.  Non-urgent messages can be sent to your provider as well.   To learn more about what you can do with MyChart, go to [email or web address].    Your next appointment:   4 month(s)  The format for your next appointment:   In Person  Provider:   You may see Shelva Majestic, MD or one of the following Advanced Practice Providers on your designated Care Team:    Almyra Deforest, PA-C  Fabian Sharp, PA-C or   Roby Lofts, [place]

## 2020-06-24 NOTE — Progress Notes (Signed)
Patient ID: Valerie Barker, female   DOB: 30-Apr-1922, 87 y.o.   MRN: [id number]     HPI: Valerie Barker is a 87 y.o. female who presents to the office for a 4 month cardiology followup evaluation.  Valerie Barker is the wife of my former patient Valerie Barker who died on 04-06-15.  Remotely, Valerie Barker suffered a TIA and has documented old asymptomatic lacunar infarcts. Prior to undergoing urologic surgery at [place] in 2012 a nuclear perfusion study showed normal perfusion. An echo Doppler study demonstrated mild to moderate TR, trace MR, and mild aortic sclerosis without stenosis. She had normal systolic function. Carotid studies done in 2011 were normal.  She was hospitalized for 2 nights after experiencing an TIA on 01/08/2013. Her symptoms resolved spontaneously. A CT of her head showed mild atrophy with extensive supratentorial small vessel disease without intracranial mass, hemorrhage or acute appearing infarction. An MRI of her brain did not show any acute intracranial findings but did show advanced atrophy with chronic microvascular ischemic changes. MRA did not reveal any intracranial flow reducing lesions. During that hospitalization a f/u echo Doppler study  on 01/29/2013 showed an ejection fraction at 65%. There was mild aortic sclerosis without stenosis. Carotid Doppler study showed minimal plaque with less than 39% reduction. She  was sent home on atorvastatin 10 mg Plavix 75 mg in addition to her valsartan HCT 320/12.5 and zolpidem.  She has had issues with swelling of legs and feet.  She also has continued issues with restless legs.  She has been taking requip 0.5 mg shortly before going to bed, but it takes a while for the medication to work and usually does not completely suffice.  At times she has taken a half a pill if she is to have a daytime nap.  When I last saw her, she was complaining of experiencing a "bobble" sensation.  She underwent carotid duplex imaging  which was normal in November 2016.  She denies any episodes of chest pain.  She is unaware of palpitations.  She admits to being fatigued , but actually feels improved from previously.  At times if she is on her back for certain duration.  She does note transient right arm paresthesias.  She denies associated chest pressure.  She presents for evaluation.  She underwent extensive dental work with her dentist, Valerie Barker, and endodontist.  She also had eyelid lift surgery at  [facility] in December 2017 and tolerated this well from a cardiovascular standpoint.  When I last saw she felt well was experiencing significant tiredness and fatigue.  At times she noted  some dizziness with movement.  She has restless legs for which she is on requip in addition to gabapentin.  She denied chest pain.  Was unaware of palpitations.  I  saw her in May 2019 following an episode of dizziness.  As she was getting off the toilet and was trying to get or toilet paper she bent over into the cabinet and when she stood up she became dizzy lightheaded fell backwards and hit her head.  Since she was on chronic Plavix therapy she presented to the emergency room for evaluation.  No acute abnormality was noted.  However, her pulse in the emergency room was 52 and blood pressure 107/41.  She has been on losartan HCT 100/25 mg, Toprol-XL 12.5 mg and this has been held if her heart rate is less than 55.  I reviewed her emergency room evaluation.  At that time, I elected to wean and discontinue her very low-dose Toprol-XL 12.5 mg.    I saw her in September 2019. Unfortunately her 61 year old sister passed away 4 weeks ago.  She had spoken to her sister on a daily basis.  Valerie Barker continues to live in independent living at [facility].  She denies chest pain.  She is unaware of palpitations.  She has not required use of HCTZ.  Continues to be on valsartan 320 mg for hypertension.  She is tolerating atorvastatin for  hyperlipidemia.   She has continued to live in independent living at [facility].  I last evaluated her in a telemedicine visit in January 2021.  At that time she denied  any chest pain or shortness of breath or palpitations.  She admits to significant fatigability.  She believes she is sleeping a lot of the time.  She does sleep well at night.  She has not been as active as she had in the past due to the COVID-19 pandemic.  She was concerned about the possibility of early Alzheimer's disease since she is starting to notice some short-term memory loss.  She also admits to swelling in her right foot and was given HCTZ 25 mg by Valerie Barker..   She was evaluated by Valerie Deforest, PA in April 2021.  At that time she was taking HCTZ 25 mg daily rather than as needed as result of leg swelling.  He was concerned that her dependent edema was due to venous stasis and recommended she change this back to just as needed.  He scheduled her for an echo Doppler study which was done on 08/11/2019 which showed an EF of 60 to 65%.  There is grade 1 diastolic dysfunction.  There were no wall motion abnormalities.  She had mildly increased RV systolic pressure 35 mg.  There was mild aortic sclerosis without stenosis.     She was evaluated by Valerie Barker of [facility] neurology.  She had previously seen Valerie Barker and has been on treatment for restless leg syndrome.  She was evaluated for memory issues but due to complaints of significant fatigability and sleepiness she was referred for a sleep study at their office which was done on 11/03/2019.  This apparently showed mild overall sleep apnea with an AHI of 10.5/h.  AHI during REM sleep was 0.  There was concern that she may have had heart rate irregularity throughout the test and her cyclic apnea seem to correlate with her irregular heart rate and rhythm.  There was concern of whether or not she had atrial fibrillation and cardiology evaluation was advised.  She was  evaluated in the  emergency room after a mechanical fall when she was walking with her walker and tripped between the transition of the hardwood floor on carpet.  She sustained injury to her right thumb and apparently is now wearing a thumb splint for small fracture.  I saw her in December 2021 at which time she denied any chest pain or shortness of breath.  She admits to fatigue and daytime sleepiness.  She was taking hydrochlorothiazide daily, olmesartan 40 mg, and requipfor her restless legs.  She also is on atorvastatin for hyperlipidemia and levothyroxine for hypothyroidism.  She is unaware of any palpitations.  Of note, in the past when very low-dose beta-blocker therapy was instituted she did get bradycardic in the low 50s.  During that evaluation, I reviewed her sleep study and felt she most likely had frequent PACs.  I last saw her in December 2021.  She has continued to live in independent living at [facility].  She denies any chest pain or significant shortness of breath.  She did experience occasional feet and ankle swelling.  She admitted to being tired frequently but was sleeping well.  With her feet and ankle edema I suggested over the next 3 days she increase her HCT dose to 25 mg and depending upon swelling to take either 12.5 mg and every now and then an extra dose for 25 mg perhaps every third day as needed.  She continued to be on olmesartan 40 mg for blood pressure and was on levothyroxine.  She continued to be on Plavix with her history of TIA and was on ropinirole for her restless leg syndrome.  Recently, she admits to being very tired and essentially can sleep anytime.  She is sleeping well at night.  She has a caregiver in the afternoon from noon until 5 PM.  She also has started physical therapy.  Her physical therapist had noted some mild heart rate irregularity in our office was notified.  She denies any chest pain.  She denies any dizziness.  Her last echo Doppler study in May 2021 showed an EF of  60 to 65% with grade 1 diastolic dysfunction, mild aortic sclerosis without stenosis and normal right heart pressures.  There was mild concentric LVH.  She presents for evaluation.  Past Medical History:  Diagnosis Date  . Heart disease   . Hypertension   . IBS (irritable bowel syndrome)   . Stroke ([place]) 04/17/2018   around 2011  . TIA (transient ischemic attack)     Past Surgical History:  Procedure Laterality Date  . ABDOMINAL HYSTERECTOMY  1978  . BUNIONECTOMY  2006, 1996  . LAPAROSCOPIC SALPINGO OOPHERECTOMY  1988    Allergies  Allergen Reactions  . Duloxetine Other (See Comments)    unknown  . Erythromycin   . Mirtazapine     Other reaction(s): Other (See Comments) unknown  . Trazodone     Other reaction(s): Other (See Comments) unknown  . Zolpidem     Other reaction(s): Other (See Comments) unknown  . Cyclobenzaprine Anxiety    Unknown  . Diazepam Rash    unknown    Current Outpatient Medications  Medication Sig Dispense Refill  . atorvastatin (LIPITOR) 10 MG tablet TAKE 1/2 TABLET EVERY DAY. 15 tablet 6  . Biotin 10000 MCG TABS Take 10,000 mcg by mouth every evening.     . Calcium Carbonate-Vitamin D (CALCIUM-D PO) Take 1 tablet by mouth every evening. 1200/500    . Cholecalciferol (VITAMIN D) 2000 UNITS CAPS Take 2,000 Units by mouth every morning.     . clopidogrel (PLAVIX) 75 MG tablet Take 75 mg by mouth daily.    . Cyanocobalamin (VITAMIN B-12 PO) Take 2,500 mcg by mouth every morning.     . hydrochlorothiazide (HYDRODIURIL) 12.5 MG tablet Alternate taking 1 tablet (12.5 mg) on one day, and 2 tablets (25 mg) the next. 90 tablet 3  . levothyroxine (SYNTHROID) 25 MCG tablet Take 25 mcg by mouth daily before breakfast.     . Multiple Vitamin (MULTIVITAMIN) capsule Take 1 capsule by mouth every morning.     . penicillin v potassium (VEETID) 500 MG tablet Take 3 tablets by mouth daily.    Marland Kitchen rOPINIRole (REQUIP) 1 MG tablet Take 1-2 mg by mouth See admin  instructions. Takes 1 at evening, 2 at bedtime    .  olmesartan (BENICAR) 20 MG tablet Take 1 tablet (20 mg total) by mouth daily. 90 tablet 3   No current facility-administered medications for this visit.    Social History   Socioeconomic History  . Marital status: Married    Spouse name: Not on file  . Number of children: Not on file  . Years of education: Not on file  . Highest education level: Not on file  Occupational History  . Not on file  Tobacco Use  . Smoking status: Never Smoker  . Smokeless tobacco: Never Used  Vaping Use  . Vaping Use: Never used  Substance and Sexual Activity  . Alcohol use: No  . Drug use: No  . Sexual activity: Not on file  Other Topics Concern  . Not on file  Social History Narrative   Lives in [place] at [place].  Education BA degree.  Retired.  3 children.  Son, [place].  Caffeine 1=2 cups daily.   Social Determinants of Health   Financial Resource Strain: Not on file  Food Insecurity: Not on file  Transportation Needs: Not on file  Physical Activity: Not on file  Stress: Not on file  Social Connections: Not on file  Intimate Partner Violence: Not on file   Socially she is widowed as of December 2016.  Her husband was a former Scientist, physiological of education that [employer] and developed progressive dementia Prior to his death  Family History  Problem Relation Age of Onset  . COPD Mother   . Schizophrenia Brother   . Stroke Father   . Breast cancer Neg Hx    ROS General: Negative; No fevers, chills, or night sweats;  HEENT: Negative; No changes in vision or hearing, sinus congestion, difficulty swallowing Pulmonary: Negative; No cough, wheezing, shortness of breath, hemoptysis Cardiovascular: Negative; No chest pain, presyncope, syncope, palpitations Positive for leg swelling  GI: Negative; No nausea, vomiting, diarrhea, or abdominal pain GU: Positive for recurrent urinary incontinence despite her prior urologic surgery;  No dysuria, hematuria, or difficulty voiding Musculoskeletal: Right thumb fracture Hematologic/Oncology: Negative; no easy bruising, bleeding Endocrine: Negative; no heat/cold intolerance; no diabetes Neuro: Positive for decreased short-term memory and history of TIA; Episode of right arm numbness in July 2017; no changes in balance, headaches Skin: Negative; No rashes or skin lesions Psychiatric: Negative; No behavioral problems, depression Sleep: Positive for restless leg syndrome; positive for daytime sleepiness no snoring,  bruxism, hypnogognic hallucinations, no cataplexy Other comprehensive 14 point system review is negative.   PE BP (!) 108/56 (BP Location: Left Arm, Patient Position: Sitting)   Pulse 60   Ht _0  (1.499 m)   Wt 133 lb 9.6 oz (60.6 kg)   SpO2 95%   BMI 26.98 kg/m    Blood pressure by me was 110/60  Wt Readings from Last 3 Encounters:  06/24/20 133 lb 9.6 oz (60.6 kg)  03/05/20 140 lb (63.5 kg)  12/12/19 143 lb (64.9 kg)   General: Alert, oriented, no distress.  Skin: normal turgor, no rashes, warm and dry HEENT: Normocephalic, atraumatic. Pupils equal round and reactive to light; sclera anicteric; extraocular muscles intact;  Nose without nasal septal hypertrophy Mouth/Parynx benign; Mallinpatti scale 3 Neck: No JVD, no carotid bruits; normal carotid upstroke Lungs: clear to ausculatation and percussion; no wheezing or rales Chest wall: without tenderness to palpitation Heart: PMI not displaced, RRR, s1 s2 normal, 1/6 systolic murmur, no diastolic murmur, no rubs, gallops, thrills, or heaves Abdomen: soft, nontender; no hepatosplenomehaly, BS+; abdominal aorta  nontender and not dilated by palpation. Back: no CVA tenderness Pulses 2+ Musculoskeletal: full range of motion, normal strength, no joint deformities Extremities: No residual edema today; no clubbing cyanosis, Homan's sign negative  Neurologic: grossly nonfocal; Cranial nerves grossly  wnl Psychologic: Normal mood and affect   ECG (independently read by me): Sinus rhythm at 60, PVCs     March 05, 2020 ECG (independently read by me): NSR at 16, PAC in transient trigeminal pattern  September 2021 ECG (independently read by me): Sinus rhythm with frequent PACs, QTc interval 412 ms.  No ST segment changes   September 2019 ECG (independently read by me): Normal sinus rhythm at 61 bpm.  No ectopy.  Normal intervals.  September 2018 ECG (independently read by me): sinus bradycardia 53 bpm.  Normal intervals.  No ST segment changes.  Decreased voltage anterolaterally  January 2018 ECG (independently read by me): Sinus bradycardia 57 bpm.  No ST segment changes.  There was malpositioning of her anterior leads.  May 2017 ECG (independently read by me): Sinus bradycardia 53 bpm.  No ectopy.  Normal intervals.  No ST segment changes.  November 2016 ECG (independently read by me): Sinus bradycardia 58 bpm.  Normal intervals.    October 2015 ECG (and apparently read by me) : Sinus rhythm with frequent PACs and transient atrial bigeminal pattern  Prior November 2014 ECG: Sinus rhythm with occasional PACs. Intervals are normal. Nonspecific T changes.  LABS: BMP Latest Ref Rng & Units 12/12/2019 07/12/2019 04/19/2018  Glucose 65 - 99 mg/dL 97 82 104(H)  BUN 10 - 36 mg/dL _0 Creatinine 0.57 - 1.00 mg/dL 0.94 0.97 0.90  BUN/Creat Ratio 12 - _1 -  Sodium 134 - 144 mmol/L 138 140 138  Potassium 3.5 - 5.2 mmol/L 4.6 4.6 3.8  Chloride 96 - 106 mmol/L 100 102 105  CO2 20 - 29 mmol/L _2 Calcium 8.7 - 10.3 mg/dL 10.3 9.2 8.9   Hepatic Function Latest Ref Rng & Units 04/19/2018 04/18/2018 04/17/2018  Total Protein 6.5 - 8.1 g/dL 5.7(L) 5.9(L) 6.1(L)  Albumin 3.5 - 5.0 g/dL 3.3(L) 3.6 3.9  AST 15 - 41 U/L _3 ALT 0 - 44 U/L _4 Alk Phosphatase 38 - 126 U/L 42 47 51  Total Bilirubin 0.3 - 1.2 mg/dL 0.8 0.8 0.8  Bilirubin, Direct 0.0 - 0.3 mg/dL - - -   CBC  Latest Ref Rng & Units 07/12/2019 04/19/2018 04/18/2018  WBC 3.4 - 10.8 x10E3/uL 7.2 6.6 8.2  Hemoglobin 11.1 - 15.9 g/dL 12.7 11.6(L) 12.1  Hematocrit 34.0 - 46.6 % 38.2 35.4(L) 36.7  Platelets 150 - 450 x10E3/uL 190 164 184   Lab Results  Component Value Date   TSH 5.800 (H) 07/12/2019     Lipid Panel     Component Value Date/Time   CHOL 128 04/18/2018 0458   TRIG 67 04/18/2018 0458   HDL 67 04/18/2018 0458   CHOLHDL 1.9 04/18/2018 0458   VLDL 13 04/18/2018 0458   LDLCALC 48 04/18/2018 0458     RADIOLOGY: Ct Head (brain) Wo Contrast  01/28/2013   CLINICAL DATA:  Right upper extremity weakness and numbness  EXAM: CT HEAD WITHOUT CONTRAST  TECHNIQUE: Contiguous axial images were obtained from the base of the skull through the vertex without intravenous contrast. Study was obtained within 24 hr of patient's arrival at the emergency department.  COMPARISON:  Brain MRI April 27, 2009 and brain  CT April 27, 2009  FINDINGS: There is mild diffuse atrophy. There is no mass, hemorrhage, extra-axial fluid collection, or midline shift. There is extensive small vessel disease throughout the centra semiovale bilaterally, a stable finding. There is no new gray-white compartment lesion. There is no demonstrable acute infarct. Bony calvarium appears intact. The mastoid air cells are clear.  IMPRESSION: Mild atrophy with extensive supratentorial small vessel disease. No intracranial mass, hemorrhage, or acute appearing infarct.   Electronically Signed   By: Lowella Grip M.D.   On: 01/28/2013 14:42   Mr Brain Wo Contrast  01/28/2013   CLINICAL DATA:  Numbness in the arms. Dry hacking cough. History of hypertension and dyslipidemia.  EXAM: MRI HEAD WITHOUT CONTRAST  MRA HEAD WITHOUT CONTRAST  TECHNIQUE: Multiplanar, multiecho pulse sequences of the brain and surrounding structures were obtained without intravenous contrast. Angiographic images of the head were obtained using MRA technique without  contrast.  COMPARISON:  CT 01/28/2013.  FINDINGS: MRI HEAD FINDINGS  The patient was unable to remain motionless for the exam. Small or subtle lesions could be overlooked.  No evidence for acute infarction, hemorrhage, mass lesion, hydrocephalus, or extra-axial fluid. Moderate age-related atrophy. Extensive chronic microvascular ischemic change. Basal ganglia mineralization but no foci of chronic hemorrhage. Flow voids are maintained. No osseous findings. No remote large vessel infarct. Bilateral cataract extraction. No acute sinus or mastoid fluid. Good general agreement with prior CT.  MRA HEAD FINDINGS  Grossly patent internal carotid arteries, and basilar artery. Vertebrals are codominant. No flow-limiting intracranial stenosis, branch occlusion, or aneurysm is evident on this motion degraded exam.  IMPRESSION: MRI HEAD IMPRESSION  No acute intracranial findings are evident. There is advanced atrophy with chronic microvascular ischemic change.  MRA HEAD IMPRESSION  No intracranial flow reducing lesion is evident.   Electronically Signed   By: Rolla Flatten M.D.   On: 01/28/2013 18:47   Mr Jodene Nam Head/brain Wo Cm  01/28/2013   CLINICAL DATA:  Numbness in the arms. Dry hacking cough. History of hypertension and dyslipidemia.  EXAM: MRI HEAD WITHOUT CONTRAST  MRA HEAD WITHOUT CONTRAST  TECHNIQUE: Multiplanar, multiecho pulse sequences of the brain and surrounding structures were obtained without intravenous contrast. Angiographic images of the head were obtained using MRA technique without contrast.  COMPARISON:  CT 01/28/2013.  FINDINGS: MRI HEAD FINDINGS  The patient was unable to remain motionless for the exam. Small or subtle lesions could be overlooked.  No evidence for acute infarction, hemorrhage, mass lesion, hydrocephalus, or extra-axial fluid. Moderate age-related atrophy. Extensive chronic microvascular ischemic change. Basal ganglia mineralization but no foci of chronic hemorrhage. Flow voids are  maintained. No osseous findings. No remote large vessel infarct. Bilateral cataract extraction. No acute sinus or mastoid fluid. Good general agreement with prior CT.  MRA HEAD FINDINGS  Grossly patent internal carotid arteries, and basilar artery. Vertebrals are codominant. No flow-limiting intracranial stenosis, branch occlusion, or aneurysm is evident on this motion degraded exam.  IMPRESSION: MRI HEAD IMPRESSION  No acute intracranial findings are evident. There is advanced atrophy with chronic microvascular ischemic change.  MRA HEAD IMPRESSION  No intracranial flow reducing lesion is evident.   Electronically Signed   By: Rolla Flatten M.D.   On: 01/28/2013 18:47   IMPRESSION:  1. Essential hypertension   2. PVC's (premature ventricular contractions)   3. Dyspnea on exertion   4. History of TIA (transient ischemic attack)   5. Restless leg syndrome   6. Hypothyroidism, unspecified type   7.  Leg swelling     ASSESSMENT AND PLAN: Ms. Marcelli is a young appearing 87 year old Caucasian female who has a history of prior TIA's and remotely had been  diagnosed as having a TIA with old asymptomatic lacunar infarct. On CT, MRI and MRA studies  significant small vessel disease of her brain had been demonstrated.  She has normal systolic function and evidence for aortic sclerosis on echo assessment. She has been Plavix with her TIA history.  At a previous evaluation, she had developed significant sinus bradycardia and ultimately her low-dose Toprol-XL 12.5 mg was weaned and DC'd.  At follow-up evaluation resting pulse was in the 60s.  Her most recent echo Doppler study in May 2021 showed EF at 60 to 65%, mild concentric LVH with grade 1 diastolic dysfunction.  Estimated RV systolic pressure was minimally increased at 35 mm.  There was mild aortic sclerosis without stenosis.  When I saw her in December she was having increased leg edema.  This has improved.  Remotely, she had undergone a sleep evaluation which  showed only mild overall sleep apnea.  She believes she is sleeping well but she is tired during the daytime.  Her blood pressure today is somewhat low and on repeat by me was 110/60.  I have suggested she decrease her olmesartan from 40 mg down to 20 mg and she will continue her hydrochlorothiazide as prescribed for  leg edema.  Her ECG today shows isolated PVCs with a ventricular rate at 60 bpm.  With her prior beta-blocker induced bradycardia I will not reinitiate therapy particularly in the setting of normal LV function.  She admits to some shortness of breath with activity.  There is no associated chest tightness or awareness of arrhythmia.  She continues to be on low-dose levothyroxine for hypothyroidism.  TSH February 2022 was 4.27.  She has restless leg syndrome and continues to be on Requip I will see her in 4 months for reevaluation or sooner as needed.   Troy Sine, MD, [facility]  06/24/2020 12:58 PM

## 2020-06-29 ENCOUNTER — Ambulatory Visit: Payer: Medicare Other | Admitting: Medical

## 2020-06-30 DIAGNOSIS — M542 Cervicalgia: Secondary | ICD-10-CM | POA: Diagnosis not present

## 2020-06-30 DIAGNOSIS — R2689 Other abnormalities of gait and mobility: Secondary | ICD-10-CM | POA: Diagnosis not present

## 2020-06-30 DIAGNOSIS — R2681 Unsteadiness on feet: Secondary | ICD-10-CM | POA: Diagnosis not present

## 2020-07-06 DIAGNOSIS — Z1159 Encounter for screening for other viral diseases: Secondary | ICD-10-CM | POA: Diagnosis not present

## 2020-07-07 DIAGNOSIS — M542 Cervicalgia: Secondary | ICD-10-CM | POA: Diagnosis not present

## 2020-07-07 DIAGNOSIS — R2689 Other abnormalities of gait and mobility: Secondary | ICD-10-CM | POA: Diagnosis not present

## 2020-07-07 DIAGNOSIS — R2681 Unsteadiness on feet: Secondary | ICD-10-CM | POA: Diagnosis not present

## 2020-07-08 ENCOUNTER — Telehealth: Payer: Self-pay | Admitting: Cardiovascular Disease

## 2020-07-08 NOTE — Telephone Encounter (Signed)
I attempted to contact patient, unable to reach anyone- unable to leave a voicemail.  Will try again later.

## 2020-07-08 NOTE — Telephone Encounter (Signed)
Pt c/o medication issue:  1. Name of Medication: olmesartan (BENICAR) 20 MG tablet  2. How are you currently taking this medication (dosage and times per day)? As directed   3. Are you having a reaction (difficulty breathing--STAT)?   4. What is your medication issue? Patient feels a numb sensation in her feet and hands upon waking up. She said once she gets up and moving it gets better. She is not sure if the medicine is causing the change

## 2020-07-09 DIAGNOSIS — M542 Cervicalgia: Secondary | ICD-10-CM | POA: Diagnosis not present

## 2020-07-09 DIAGNOSIS — R2681 Unsteadiness on feet: Secondary | ICD-10-CM | POA: Diagnosis not present

## 2020-07-09 DIAGNOSIS — R2689 Other abnormalities of gait and mobility: Secondary | ICD-10-CM | POA: Diagnosis not present

## 2020-07-13 DIAGNOSIS — Z1159 Encounter for screening for other viral diseases: Secondary | ICD-10-CM | POA: Diagnosis not present

## 2020-07-14 DIAGNOSIS — R2681 Unsteadiness on feet: Secondary | ICD-10-CM | POA: Diagnosis not present

## 2020-07-14 DIAGNOSIS — R2689 Other abnormalities of gait and mobility: Secondary | ICD-10-CM | POA: Diagnosis not present

## 2020-07-14 DIAGNOSIS — M542 Cervicalgia: Secondary | ICD-10-CM | POA: Diagnosis not present

## 2020-07-17 DIAGNOSIS — R2689 Other abnormalities of gait and mobility: Secondary | ICD-10-CM | POA: Diagnosis not present

## 2020-07-17 DIAGNOSIS — R2681 Unsteadiness on feet: Secondary | ICD-10-CM | POA: Diagnosis not present

## 2020-07-17 DIAGNOSIS — M542 Cervicalgia: Secondary | ICD-10-CM | POA: Diagnosis not present

## 2020-07-17 NOTE — Telephone Encounter (Signed)
Unable to reach patient - VM is not set up

## 2020-07-20 DIAGNOSIS — Z1159 Encounter for screening for other viral diseases: Secondary | ICD-10-CM | POA: Diagnosis not present

## 2020-07-21 DIAGNOSIS — R2689 Other abnormalities of gait and mobility: Secondary | ICD-10-CM | POA: Diagnosis not present

## 2020-07-21 DIAGNOSIS — M542 Cervicalgia: Secondary | ICD-10-CM | POA: Diagnosis not present

## 2020-07-21 DIAGNOSIS — R2681 Unsteadiness on feet: Secondary | ICD-10-CM | POA: Diagnosis not present

## 2020-07-22 DIAGNOSIS — H524 Presbyopia: Secondary | ICD-10-CM | POA: Diagnosis not present

## 2020-07-22 DIAGNOSIS — Z961 Presence of intraocular lens: Secondary | ICD-10-CM | POA: Diagnosis not present

## 2020-07-22 DIAGNOSIS — H532 Diplopia: Secondary | ICD-10-CM | POA: Diagnosis not present

## 2020-07-22 DIAGNOSIS — Z9889 Other specified postprocedural states: Secondary | ICD-10-CM | POA: Diagnosis not present

## 2020-07-22 DIAGNOSIS — H52201 Unspecified astigmatism, right eye: Secondary | ICD-10-CM | POA: Diagnosis not present

## 2020-07-23 DIAGNOSIS — R2689 Other abnormalities of gait and mobility: Secondary | ICD-10-CM | POA: Diagnosis not present

## 2020-07-23 DIAGNOSIS — R2681 Unsteadiness on feet: Secondary | ICD-10-CM | POA: Diagnosis not present

## 2020-07-23 DIAGNOSIS — M542 Cervicalgia: Secondary | ICD-10-CM | POA: Diagnosis not present

## 2020-07-27 DIAGNOSIS — Z1159 Encounter for screening for other viral diseases: Secondary | ICD-10-CM | POA: Diagnosis not present

## 2020-07-28 DIAGNOSIS — M542 Cervicalgia: Secondary | ICD-10-CM | POA: Diagnosis not present

## 2020-07-28 DIAGNOSIS — R2689 Other abnormalities of gait and mobility: Secondary | ICD-10-CM | POA: Diagnosis not present

## 2020-07-28 DIAGNOSIS — R2681 Unsteadiness on feet: Secondary | ICD-10-CM | POA: Diagnosis not present

## 2020-07-30 DIAGNOSIS — R2689 Other abnormalities of gait and mobility: Secondary | ICD-10-CM | POA: Diagnosis not present

## 2020-07-30 DIAGNOSIS — R2681 Unsteadiness on feet: Secondary | ICD-10-CM | POA: Diagnosis not present

## 2020-07-30 DIAGNOSIS — M542 Cervicalgia: Secondary | ICD-10-CM | POA: Diagnosis not present

## 2020-08-10 DIAGNOSIS — Z1159 Encounter for screening for other viral diseases: Secondary | ICD-10-CM | POA: Diagnosis not present

## 2020-08-17 DIAGNOSIS — Z1159 Encounter for screening for other viral diseases: Secondary | ICD-10-CM | POA: Diagnosis not present

## 2020-08-20 DIAGNOSIS — Z1159 Encounter for screening for other viral diseases: Secondary | ICD-10-CM | POA: Diagnosis not present

## 2020-08-24 DIAGNOSIS — Z1159 Encounter for screening for other viral diseases: Secondary | ICD-10-CM | POA: Diagnosis not present

## 2020-08-27 DIAGNOSIS — H9319 Tinnitus, unspecified ear: Secondary | ICD-10-CM | POA: Diagnosis not present

## 2020-08-31 DIAGNOSIS — Z1159 Encounter for screening for other viral diseases: Secondary | ICD-10-CM | POA: Diagnosis not present

## 2020-09-07 DIAGNOSIS — Z1159 Encounter for screening for other viral diseases: Secondary | ICD-10-CM | POA: Diagnosis not present

## 2020-09-21 DIAGNOSIS — Z1159 Encounter for screening for other viral diseases: Secondary | ICD-10-CM | POA: Diagnosis not present

## 2020-10-05 DIAGNOSIS — Z1159 Encounter for screening for other viral diseases: Secondary | ICD-10-CM | POA: Diagnosis not present

## 2020-10-19 ENCOUNTER — Other Ambulatory Visit: Payer: Self-pay

## 2020-10-19 ENCOUNTER — Encounter: Payer: Self-pay | Admitting: Cardiovascular Disease

## 2020-10-19 ENCOUNTER — Ambulatory Visit: Payer: Medicare Other | Admitting: Cardiovascular Disease

## 2020-10-19 VITALS — BP 110/50 | HR 55 | Ht 59.0 in | Wt 132.8 lb

## 2020-10-19 DIAGNOSIS — Z1159 Encounter for screening for other viral diseases: Secondary | ICD-10-CM | POA: Diagnosis not present

## 2020-10-19 DIAGNOSIS — I10 Essential (primary) hypertension: Secondary | ICD-10-CM

## 2020-10-19 DIAGNOSIS — E039 Hypothyroidism, unspecified: Secondary | ICD-10-CM | POA: Diagnosis not present

## 2020-10-19 DIAGNOSIS — M7989 Other specified soft tissue disorders: Secondary | ICD-10-CM

## 2020-10-19 DIAGNOSIS — Z8673 Personal history of transient ischemic attack (TIA), and cerebral infarction without residual deficits: Secondary | ICD-10-CM | POA: Diagnosis not present

## 2020-10-19 DIAGNOSIS — G2581 Restless legs syndrome: Secondary | ICD-10-CM

## 2020-10-19 DIAGNOSIS — I493 Ventricular premature depolarization: Secondary | ICD-10-CM | POA: Diagnosis not present

## 2020-10-19 NOTE — Patient Instructions (Signed)
Medication Instructions:  Your physician recommends that you continue on your current medications as directed. Please refer to the Current Medication list given to you today.  *If you need a refill on your cardiac medications before your next appointment, please call your pharmacy*   Lab Work: None ordered.    Testing/Procedures: None ordered.    Follow-Up: At [facility], you and your health needs are our priority.  As part of our continuing mission to provide you with exceptional heart care, we have created designated Provider Care Teams.  These Care Teams include your primary Cardiologist (physician) and Advanced Practice Providers (APPs -  Physician Assistants and Nurse Practitioners) who all work together to provide you with the care you need, when you need it.  We recommend signing up for the patient portal called "MyChart".  Sign up information is provided on this After Visit Summary.  MyChart is used to connect with patients for Virtual Visits (Telemedicine).  Patients are able to view lab/test results, encounter notes, upcoming appointments, etc.  Non-urgent messages can be sent to your provider as well.   To learn more about what you can do with MyChart, go to [email or web address].    Your next appointment:   6 month(s)  The format for your next appointment:   In Person  Provider:   Thomas Kelly, MD     

## 2020-10-19 NOTE — Progress Notes (Signed)
Patient ID: Valerie Barker, female   DOB: 30-Apr-1922, 87 y.o.   MRN: [id number]     HPI: Valerie Barker is a 87 y.o. female who presents to the office for a 4 month cardiology followup evaluation.  Valerie Barker is the wife of my former patient Dr. Buckner Malta who died on 04-06-15.  Remotely, Valerie Barker suffered a TIA and has documented old asymptomatic lacunar infarcts. Prior to undergoing urologic surgery at [place] in 2012 a nuclear perfusion study showed normal perfusion. An echo Doppler study demonstrated mild to moderate TR, trace MR, and mild aortic sclerosis without stenosis. She had normal systolic function. Carotid studies done in 2011 were normal.  She was hospitalized for 2 nights after experiencing an TIA on 01/08/2013. Her symptoms resolved spontaneously. A CT of her head showed mild atrophy with extensive supratentorial small vessel disease without intracranial mass, hemorrhage or acute appearing infarction. An MRI of her brain did not show any acute intracranial findings but did show advanced atrophy with chronic microvascular ischemic changes. MRA did not reveal any intracranial flow reducing lesions. During that hospitalization a f/u echo Doppler study  on 01/29/2013 showed an ejection fraction at 65%. There was mild aortic sclerosis without stenosis. Carotid Doppler study showed minimal plaque with less than 39% reduction. She  was sent home on atorvastatin 10 mg Plavix 75 mg in addition to her valsartan HCT 320/12.5 and zolpidem.  She has had issues with swelling of legs and feet.  She also has continued issues with restless legs.  She has been taking requip 0.5 mg shortly before going to bed, but it takes a while for the medication to work and usually does not completely suffice.  At times she has taken a half a pill if she is to have a daytime nap.  When I last saw her, she was complaining of experiencing a "bobble" sensation.  She underwent carotid duplex imaging  which was normal in November 2016.  She denies any episodes of chest pain.  She is unaware of palpitations.  She admits to being fatigued , but actually feels improved from previously.  At times if she is on her back for certain duration.  She does note transient right arm paresthesias.  She denies associated chest pressure.  She presents for evaluation.  She underwent extensive dental work with her dentist, Chief Financial Officer, and endodontist.  She also had eyelid lift surgery at  [facility] in December 2017 and tolerated this well from a cardiovascular standpoint.  When I last saw she felt well was experiencing significant tiredness and fatigue.  At times she noted  some dizziness with movement.  She has restless legs for which she is on requip in addition to gabapentin.  She denied chest pain.  Was unaware of palpitations.  I  saw her in May 2019 following an episode of dizziness.  As she was getting off the toilet and was trying to get or toilet paper she bent over into the cabinet and when she stood up she became dizzy lightheaded fell backwards and hit her head.  Since she was on chronic Plavix therapy she presented to the emergency room for evaluation.  No acute abnormality was noted.  However, her pulse in the emergency room was 52 and blood pressure 107/41.  She has been on losartan HCT 100/25 mg, Toprol-XL 12.5 mg and this has been held if her heart rate is less than 55.  I reviewed her emergency room evaluation.  At that time, I elected to wean and discontinue her very low-dose Toprol-XL 12.5 mg.    I saw her in September 2019. Unfortunately her 14 year old sister passed away 4 weeks ago.  She had spoken to her sister on a daily basis.  Valerie Barker continues to live in independent living at [facility].  She denies chest pain.  She is unaware of palpitations.  She has not required use of HCTZ.  Continues to be on valsartan 320 mg for hypertension.  She is tolerating atorvastatin for  hyperlipidemia.   She has continued to live in independent living at [facility].  I last evaluated her in a telemedicine visit in January 2021.  At that time she denied  any chest pain or shortness of breath or palpitations.  She admits to significant fatigability.  She believes she is sleeping a lot of the time.  She does sleep well at night.  She has not been as active as she had in the past due to the COVID-19 pandemic.  She was concerned about the possibility of early Alzheimer's disease since she is starting to notice some short-term memory loss.  She also admits to swelling in her right foot and was given HCTZ 25 mg by Dr. Shelia Media..   She was evaluated by Almyra Deforest, PA in April 2021.  At that time she was taking HCTZ 25 mg daily rather than as needed as result of leg swelling.  He was concerned that her dependent edema was due to venous stasis and recommended she change this back to just as needed.  He scheduled her for an echo Doppler study which was done on 08/11/2019 which showed an EF of 60 to 65%.  There is grade 1 diastolic dysfunction.  There were no wall motion abnormalities.  She had mildly increased RV systolic pressure 35 mg.  There was mild aortic sclerosis without stenosis.     She was evaluated by Dr. Maureen Chatters of [facility] neurology.  She had previously seen Dr. Leta Baptist and has been on treatment for restless leg syndrome.  She was evaluated for memory issues but due to complaints of significant fatigability and sleepiness she was referred for a sleep study at their office which was done on 11/03/2019.  This apparently showed mild overall sleep apnea with an AHI of 10.5/h.  AHI during REM sleep was 0.  There was concern that she may have had heart rate irregularity throughout the test and her cyclic apnea seem to correlate with her irregular heart rate and rhythm.  There was concern of whether or not she had atrial fibrillation and cardiology evaluation was advised.  She was  evaluated in the  emergency room after a mechanical fall when she was walking with her walker and tripped between the transition of the hardwood floor on carpet.  She sustained injury to her right thumb and apparently is now wearing a thumb splint for small fracture.  I saw her in September 2021 at which time she denied any chest pain or shortness of breath.  She admits to fatigue and daytime sleepiness.  She was taking hydrochlorothiazide daily, olmesartan 40 mg, and requipfor her restless legs.  She also is on atorvastatin for hyperlipidemia and levothyroxine for hypothyroidism.  She is unaware of any palpitations.  Of note, in the past when very low-dose beta-blocker therapy was instituted she did get bradycardic in the low 50s.  During that evaluation, I reviewed her sleep study and felt she most likely had frequent PACs.  I saw her in December 2021.  She continued to live in independent living at [facility].  She denies any chest pain or significant shortness of breath.  She did experience occasional feet and ankle swelling.  She admitted to being tired frequently but was sleeping well.  With her feet and ankle edema I suggested over the next 3 days she increase her HCT dose to 25 mg and depending upon swelling to take either 12.5 mg and every now and then an extra dose for 25 mg perhaps every third day as needed.  She continued to be on olmesartan 40 mg for blood pressure and was on levothyroxine.  She continued to be on Plavix with her history of TIA and was on ropinirole for her restless leg syndrome.  I last saw her on June 24, 2020.  At that time she admitted to being very tired and could not sleep at any time.  She was sleeping well at night.  She has a caregiver in the afternoon from noon until 5 PM.  She also has started physical therapy.  Her physical therapist had noted some mild heart rate irregularity and our office was notified.  She denied any chest pain.  She denied any dizziness.  Her last echo Doppler  study in May 2021 showed an EF of 60 to 65% with grade 1 diastolic dysfunction, mild aortic sclerosis without stenosis and normal right heart pressures.  There was mild concentric LVH.  During that evaluation her blood pressure was low and a repeat by me was 110/60.  I suggested she decrease olmesartan from 40 mg down to 20 mg and continue the HCTZ as prescribed for leg edema.  Her ECG showed isolated PVCs with a ventricular rate at 60.  However with her prior beta-blocker induced bradycardia I did not recommend reinstitution of beta-blockade in the setting of normal LV function.  Since I last saw her, continue to live at independent living at [facility].  She has a caregiver through [facility] and she states the caregivers have been excellent.  She is able to walk with a walker.  She denies any chest pain.  She does note mild shortness of breath if she tries to walk fast but otherwise feels great.  She is sleeping well.  She denies any dizziness.  She has not had any recent falls.  She is unaware of palpitations.  She presents for evaluation.  Past Medical History:  Diagnosis Date   Heart disease    Hypertension    IBS (irritable bowel syndrome)    Stroke ([place]) 04/17/2018   around 2011   TIA (transient ischemic attack)     Past Surgical History:  Procedure Laterality Date   ABDOMINAL HYSTERECTOMY  1978   BUNIONECTOMY  2006, [place]    Allergies  Allergen Reactions   Duloxetine Other (See Comments)    unknown   Erythromycin    Mirtazapine     Other reaction(s): Other (See Comments) unknown   Trazodone     Other reaction(s): Other (See Comments) unknown   Zolpidem     Other reaction(s): Other (See Comments) unknown   Cyclobenzaprine Anxiety    Unknown   Diazepam Rash    unknown    Current Outpatient Medications  Medication Sig Dispense Refill   atorvastatin (LIPITOR) 10 MG tablet TAKE 1/2 TABLET EVERY DAY. 15 tablet 6   Biotin  10000 MCG TABS Take 10,000 mcg by mouth every evening.  Calcium Carbonate-Vitamin D (CALCIUM-D PO) Take 1 tablet by mouth every evening. 1200/500     Cholecalciferol (VITAMIN D) 2000 UNITS CAPS Take 2,000 Units by mouth every morning.      clopidogrel (PLAVIX) 75 MG tablet Take 75 mg by mouth daily.     Cyanocobalamin (VITAMIN B-12 PO) Take 2,500 mcg by mouth every morning.      hydrochlorothiazide (HYDRODIURIL) 12.5 MG tablet Alternate taking 1 tablet (12.5 mg) on one day, and 2 tablets (25 mg) the next. 90 tablet 3   levothyroxine (SYNTHROID) 25 MCG tablet Take 25 mcg by mouth daily before breakfast.      Multiple Vitamin (MULTIVITAMIN) capsule Take 1 capsule by mouth every morning.  (Patient not taking: Reported on 10/19/2020)     olmesartan (BENICAR) 20 MG tablet Take 1 tablet (20 mg total) by mouth daily. (Patient not taking: Reported on 10/19/2020) 90 tablet 3   penicillin v potassium (VEETID) 500 MG tablet Take 3 tablets by mouth daily. (Patient not taking: Reported on 10/19/2020)     rOPINIRole (REQUIP) 1 MG tablet Take 1-2 mg by mouth See admin instructions. Takes 1 at evening, 2 at bedtime (Patient not taking: Reported on 10/19/2020)     No current facility-administered medications for this visit.    Social History   Socioeconomic History   Marital status: Married    Spouse name: Not on file   Number of children: Not on file   Years of education: Not on file   Highest education level: Not on file  Occupational History   Not on file  Tobacco Use   Smoking status: Never   Smokeless tobacco: Never  Vaping Use   Vaping Use: Never used  Substance and Sexual Activity   Alcohol use: No   Drug use: No   Sexual activity: Not on file  Other Topics Concern   Not on file  Social History Narrative   Lives in [place] at [place].  Education BA degree.  Retired.  3 children.  Son, [place].  Caffeine 1=2 cups daily.   Social Determinants of Health   Financial Resource  Strain: Not on file  Food Insecurity: Not on file  Transportation Needs: Not on file  Physical Activity: Not on file  Stress: Not on file  Social Connections: Not on file  Intimate Partner Violence: Not on file   Socially she is widowed as of December 2016.  Her husband was a former Scientist, physiological of education that [employer] and developed progressive dementia Prior to his death  Family History  Problem Relation Age of Onset   COPD Mother    Schizophrenia Brother    Stroke Father    Breast cancer Neg Hx    ROS General: Negative; No fevers, chills, or night sweats;  HEENT: Negative; No changes in vision or hearing, sinus congestion, difficulty swallowing Pulmonary: Negative; No cough, wheezing, shortness of breath, hemoptysis Cardiovascular: Negative; No chest pain, presyncope, syncope, palpitations Positive for leg swelling  GI: Negative; No nausea, vomiting, diarrhea, or abdominal pain GU: Positive for recurrent urinary incontinence despite her prior urologic surgery; No dysuria, hematuria, or difficulty voiding Musculoskeletal: Right thumb fracture Hematologic/Oncology: Negative; no easy bruising, bleeding Endocrine: Negative; no heat/cold intolerance; no diabetes Neuro: Positive for decreased short-term memory and history of TIA; Episode of right arm numbness in July 2017; no changes in balance, headaches Skin: Negative; No rashes or skin lesions Psychiatric: Negative; No behavioral problems, depression Sleep: Positive for restless leg syndrome; positive for daytime sleepiness  no snoring,  bruxism, hypnogognic hallucinations, no cataplexy Other comprehensive 14 point system review is negative.   PE BP (!) 110/50 (BP Location: Left Arm)   Pulse (!) 55   Ht 4' 11" (1.499 m)   Wt 132 lb 12.8 oz (60.2 kg)   BMI 26.82 kg/m    Repeat blood pressure by me was 112/62.  Wt Readings from Last 3 Encounters:  10/19/20 132 lb 12.8 oz (60.2 kg)  06/24/20 133 lb 9.6 oz (60.6 kg)   03/05/20 140 lb (63.5 kg)   General: Alert, oriented, no distress.  Skin: normal turgor, no rashes, warm and dry HEENT: Normocephalic, atraumatic. Pupils equal round and reactive to light; sclera anicteric; extraocular muscles intact;  Nose without nasal septal hypertrophy Mouth/Parynx benign; Mallinpatti scale 2 Neck: No JVD, no carotid bruits; normal carotid upstroke Lungs: clear to ausculatation and percussion; no wheezing or rales Chest wall: without tenderness to palpitation Heart: PMI not displaced, RRR, s1 s2 normal, 1/6 systolic murmur, no diastolic murmur, no rubs, gallops, thrills, or heaves Abdomen: soft, nontender; no hepatosplenomehaly, BS+; abdominal aorta nontender and not dilated by palpation. Back: no CVA tenderness Pulses 2+ Musculoskeletal: full range of motion, normal strength, no joint deformities Extremities: no clubbing cyanosis or edema, Homan's sign negative  Neurologic: grossly nonfocal; Cranial nerves grossly wnl Psychologic: Normal mood and affect  ECG (independently read by me): Sinus bradycardia at 55 bpm.  No ectopy.  Normal intervals.  Low voltage precordial leads  June 24, 2020 ECG (independently read by me): Sinus rhythm at 60, PVCs     March 05, 2020 ECG (independently read by me): NSR at 16, PAC in transient trigeminal pattern  September 2021 ECG (independently read by me): Sinus rhythm with frequent PACs, QTc interval 412 ms.  No ST segment changes   September 2019 ECG (independently read by me): Normal sinus rhythm at 61 bpm.  No ectopy.  Normal intervals.  September 2018 ECG (independently read by me): sinus bradycardia 53 bpm.  Normal intervals.  No ST segment changes.  Decreased voltage anterolaterally  January 2018 ECG (independently read by me): Sinus bradycardia 57 bpm.  No ST segment changes.  There was malpositioning of her anterior leads.  May 2017 ECG (independently read by me): Sinus bradycardia 53 bpm.  No ectopy.  Normal  intervals.  No ST segment changes.  November 2016 ECG (independently read by me): Sinus bradycardia 58 bpm.  Normal intervals.    October 2015 ECG (and apparently read by me) : Sinus rhythm with frequent PACs and transient atrial bigeminal pattern  Prior November 2014 ECG: Sinus rhythm with occasional PACs. Intervals are normal. Nonspecific T changes.  LABS: BMP Latest Ref Rng & Units 12/12/2019 07/12/2019 04/19/2018  Glucose 65 - 99 mg/dL 97 82 104(H)  BUN 10 - 36 mg/dL _0 Creatinine 0.57 - 1.00 mg/dL 0.94 0.97 0.90  BUN/Creat Ratio 12 - _1 -  Sodium 134 - 144 mmol/L 138 140 138  Potassium 3.5 - 5.2 mmol/L 4.6 4.6 3.8  Chloride 96 - 106 mmol/L 100 102 105  CO2 20 - 29 mmol/L _2 Calcium 8.7 - 10.3 mg/dL 10.3 9.2 8.9   Hepatic Function Latest Ref Rng & Units 04/19/2018 04/18/2018 04/17/2018  Total Protein 6.5 - 8.1 g/dL 5.7(L) 5.9(L) 6.1(L)  Albumin 3.5 - 5.0 g/dL 3.3(L) 3.6 3.9  AST 15 - 41 U/L _3 ALT 0 - 44 U/L _4 Alk Phosphatase  38 - 126 U/L 42 47 51  Total Bilirubin 0.3 - 1.2 mg/dL 0.8 0.8 0.8  Bilirubin, Direct 0.0 - 0.3 mg/dL - - -   CBC Latest Ref Rng & Units 07/12/2019 04/19/2018 04/18/2018  WBC 3.4 - 10.8 x10E3/uL 7.2 6.6 8.2  Hemoglobin 11.1 - 15.9 g/dL 12.7 11.6(L) 12.1  Hematocrit 34.0 - 46.6 % 38.2 35.4(L) 36.7  Platelets 150 - 450 x10E3/uL 190 164 184   Lab Results  Component Value Date   TSH 5.800 (H) 07/12/2019     Lipid Panel     Component Value Date/Time   CHOL 128 04/18/2018 0458   TRIG 67 04/18/2018 0458   HDL 67 04/18/2018 0458   CHOLHDL 1.9 04/18/2018 0458   VLDL 13 04/18/2018 0458   LDLCALC 48 04/18/2018 0458     RADIOLOGY: Ct Head (brain) Wo Contrast  01/28/2013   CLINICAL DATA:  Right upper extremity weakness and numbness  EXAM: CT HEAD WITHOUT CONTRAST  TECHNIQUE: Contiguous axial images were obtained from the base of the skull through the vertex without intravenous contrast. Study was obtained within 24 hr of  patient's arrival at the emergency department.  COMPARISON:  Brain MRI April 27, 2009 and brain CT April 27, 2009  FINDINGS: There is mild diffuse atrophy. There is no mass, hemorrhage, extra-axial fluid collection, or midline shift. There is extensive small vessel disease throughout the centra semiovale bilaterally, a stable finding. There is no new gray-white compartment lesion. There is no demonstrable acute infarct. Bony calvarium appears intact. The mastoid air cells are clear.  IMPRESSION: Mild atrophy with extensive supratentorial small vessel disease. No intracranial mass, hemorrhage, or acute appearing infarct.   Electronically Signed   By: Lowella Grip M.D.   On: 01/28/2013 14:42   Mr Brain Wo Contrast  01/28/2013   CLINICAL DATA:  Numbness in the arms. Dry hacking cough. History of hypertension and dyslipidemia.  EXAM: MRI HEAD WITHOUT CONTRAST  MRA HEAD WITHOUT CONTRAST  TECHNIQUE: Multiplanar, multiecho pulse sequences of the brain and surrounding structures were obtained without intravenous contrast. Angiographic images of the head were obtained using MRA technique without contrast.  COMPARISON:  CT 01/28/2013.  FINDINGS: MRI HEAD FINDINGS  The patient was unable to remain motionless for the exam. Small or subtle lesions could be overlooked.  No evidence for acute infarction, hemorrhage, mass lesion, hydrocephalus, or extra-axial fluid. Moderate age-related atrophy. Extensive chronic microvascular ischemic change. Basal ganglia mineralization but no foci of chronic hemorrhage. Flow voids are maintained. No osseous findings. No remote large vessel infarct. Bilateral cataract extraction. No acute sinus or mastoid fluid. Good general agreement with prior CT.  MRA HEAD FINDINGS  Grossly patent internal carotid arteries, and basilar artery. Vertebrals are codominant. No flow-limiting intracranial stenosis, branch occlusion, or aneurysm is evident on this motion degraded exam.  IMPRESSION: MRI  HEAD IMPRESSION  No acute intracranial findings are evident. There is advanced atrophy with chronic microvascular ischemic change.  MRA HEAD IMPRESSION  No intracranial flow reducing lesion is evident.   Electronically Signed   By: Rolla Flatten M.D.   On: 01/28/2013 18:47   Mr Jodene Nam Head/brain Wo Cm  01/28/2013   CLINICAL DATA:  Numbness in the arms. Dry hacking cough. History of hypertension and dyslipidemia.  EXAM: MRI HEAD WITHOUT CONTRAST  MRA HEAD WITHOUT CONTRAST  TECHNIQUE: Multiplanar, multiecho pulse sequences of the brain and surrounding structures were obtained without intravenous contrast. Angiographic images of the head were obtained using MRA technique without contrast.  COMPARISON:  CT 01/28/2013.  FINDINGS: MRI HEAD FINDINGS  The patient was unable to remain motionless for the exam. Small or subtle lesions could be overlooked.  No evidence for acute infarction, hemorrhage, mass lesion, hydrocephalus, or extra-axial fluid. Moderate age-related atrophy. Extensive chronic microvascular ischemic change. Basal ganglia mineralization but no foci of chronic hemorrhage. Flow voids are maintained. No osseous findings. No remote large vessel infarct. Bilateral cataract extraction. No acute sinus or mastoid fluid. Good general agreement with prior CT.  MRA HEAD FINDINGS  Grossly patent internal carotid arteries, and basilar artery. Vertebrals are codominant. No flow-limiting intracranial stenosis, branch occlusion, or aneurysm is evident on this motion degraded exam.  IMPRESSION: MRI HEAD IMPRESSION  No acute intracranial findings are evident. There is advanced atrophy with chronic microvascular ischemic change.  MRA HEAD IMPRESSION  No intracranial flow reducing lesion is evident.   Electronically Signed   By: Rolla Flatten M.D.   On: 01/28/2013 18:47   IMPRESSION:  1. Essential hypertension   2. History of TIA (transient ischemic attack)   3. Restless leg syndrome   4. Hypothyroidism, unspecified type    5. Leg swelling: resolved   6. PVC's (premature ventricular contractions): resolved      ASSESSMENT AND PLAN: Valerie Barker is a young appearing 87 year old Caucasian female who has a history of prior TIA's and remotely had been  diagnosed as having a TIA with old asymptomatic lacunar infarct. On CT, MRI and MRA studies significant small vessel disease of her brain had been demonstrated.  She has normal systolic function and evidence for aortic sclerosis on echo assessment. She has been Plavix with her TIA history.  At a previous evaluation, she had developed significant sinus bradycardia and ultimately her low-dose Toprol-XL 12.5 mg was weaned and DC'd.  At follow-up evaluation resting pulse was in the 60s.  Her last echo Doppler study in May 2021 showed EF at 60 to 65%, mild concentric LVH with grade 1 diastolic dysfunction.  Estimated RV systolic pressure was minimally increased at 35 mm.  There was mild aortic sclerosis without stenosis.  When I saw her in December 2021 she was experiencing increasing leg swelling.  This had improved with HCTZ.  At her last evaluation in March 2022, her blood pressure was mildly low and olmesartan was reduced from 40 mg down to 20 mg and she continued to be on HCTZ for her leg edema.  At the time she did have isolated PVCs.  Presently, she feels well.  Her blood pressure is excellent.  She is unaware of any palpitations.  Her ECG shows sinus bradycardia at 55 bpm without ectopy.  Intervals are normal.  She continues to be on olmesartan 20 mg daily for blood pressure control.  Her restless legs is controlled with Requip.  She walks well with a walker and only experiences some mild shortness of breath if she tries to walk too fast.  She continues to be mentally sharp.  She recently ordered a book from the [facility] to learn about urinary incontinence which she states is her major concern of getting older.  She continues to be on levothyroxine 25 mcg for hypothyroidism.   TSH in February 2022 was 4.27.  She has not seen her primary physician Dr. Shelia Media in some time and will be making a follow-up appointment.  She prefers that I see her in 6 months for follow-up evaluation.    Troy Sine, MD, [facility]  10/19/2020 12:24 PM

## 2020-10-26 DIAGNOSIS — Z1159 Encounter for screening for other viral diseases: Secondary | ICD-10-CM | POA: Diagnosis not present

## 2020-11-02 DIAGNOSIS — Z1159 Encounter for screening for other viral diseases: Secondary | ICD-10-CM | POA: Diagnosis not present

## 2020-11-19 DIAGNOSIS — M545 Low back pain, unspecified: Secondary | ICD-10-CM | POA: Diagnosis not present

## 2020-11-19 DIAGNOSIS — N390 Urinary tract infection, site not specified: Secondary | ICD-10-CM | POA: Diagnosis not present

## 2020-11-19 DIAGNOSIS — R42 Dizziness and giddiness: Secondary | ICD-10-CM | POA: Diagnosis not present

## 2020-11-20 ENCOUNTER — Other Ambulatory Visit: Payer: Self-pay | Admitting: Cardiovascular Disease

## 2020-11-30 DIAGNOSIS — Z8616 Personal history of COVID-19: Secondary | ICD-10-CM | POA: Diagnosis not present

## 2020-12-14 ENCOUNTER — Other Ambulatory Visit: Payer: Self-pay | Admitting: Cardiovascular Disease

## 2020-12-14 DIAGNOSIS — Z8616 Personal history of COVID-19: Secondary | ICD-10-CM | POA: Diagnosis not present

## 2020-12-15 DIAGNOSIS — Z72820 Sleep deprivation: Secondary | ICD-10-CM | POA: Diagnosis not present

## 2020-12-15 DIAGNOSIS — R410 Disorientation, unspecified: Secondary | ICD-10-CM | POA: Diagnosis not present

## 2020-12-21 DIAGNOSIS — Z8616 Personal history of COVID-19: Secondary | ICD-10-CM | POA: Diagnosis not present

## 2020-12-21 DIAGNOSIS — Z72820 Sleep deprivation: Secondary | ICD-10-CM | POA: Diagnosis not present

## 2020-12-21 DIAGNOSIS — Z23 Encounter for immunization: Secondary | ICD-10-CM | POA: Diagnosis not present

## 2021-01-18 DIAGNOSIS — Z8616 Personal history of COVID-19: Secondary | ICD-10-CM | POA: Diagnosis not present

## 2021-02-22 DIAGNOSIS — Z20822 Contact with and (suspected) exposure to covid-19: Secondary | ICD-10-CM | POA: Diagnosis not present

## 2021-03-17 DIAGNOSIS — Z1159 Encounter for screening for other viral diseases: Secondary | ICD-10-CM | POA: Diagnosis not present

## 2021-03-19 DIAGNOSIS — Z1159 Encounter for screening for other viral diseases: Secondary | ICD-10-CM | POA: Diagnosis not present

## 2021-03-22 DIAGNOSIS — Z1159 Encounter for screening for other viral diseases: Secondary | ICD-10-CM | POA: Diagnosis not present

## 2021-04-26 DIAGNOSIS — Z20822 Contact with and (suspected) exposure to covid-19: Secondary | ICD-10-CM | POA: Diagnosis not present

## 2021-04-27 DIAGNOSIS — E039 Hypothyroidism, unspecified: Secondary | ICD-10-CM | POA: Diagnosis not present

## 2021-04-27 DIAGNOSIS — M810 Age-related osteoporosis without current pathological fracture: Secondary | ICD-10-CM | POA: Diagnosis not present

## 2021-04-27 DIAGNOSIS — I10 Essential (primary) hypertension: Secondary | ICD-10-CM | POA: Diagnosis not present

## 2021-05-26 DIAGNOSIS — R413 Other amnesia: Secondary | ICD-10-CM | POA: Diagnosis not present

## 2021-05-26 DIAGNOSIS — G2581 Restless legs syndrome: Secondary | ICD-10-CM | POA: Diagnosis not present

## 2021-05-26 DIAGNOSIS — E039 Hypothyroidism, unspecified: Secondary | ICD-10-CM | POA: Diagnosis not present

## 2021-05-26 DIAGNOSIS — M810 Age-related osteoporosis without current pathological fracture: Secondary | ICD-10-CM | POA: Diagnosis not present

## 2021-05-26 DIAGNOSIS — I10 Essential (primary) hypertension: Secondary | ICD-10-CM | POA: Diagnosis not present

## 2021-05-26 DIAGNOSIS — Z8673 Personal history of transient ischemic attack (TIA), and cerebral infarction without residual deficits: Secondary | ICD-10-CM | POA: Diagnosis not present

## 2021-05-26 DIAGNOSIS — N3000 Acute cystitis without hematuria: Secondary | ICD-10-CM | POA: Diagnosis not present

## 2021-05-26 DIAGNOSIS — Z0000 Encounter for general adult medical examination without abnormal findings: Secondary | ICD-10-CM | POA: Diagnosis not present

## 2021-06-01 ENCOUNTER — Encounter: Payer: Self-pay | Admitting: Diagnostic Neuroimaging

## 2021-06-01 ENCOUNTER — Ambulatory Visit: Payer: Medicare Other | Admitting: Diagnostic Neuroimaging

## 2021-06-01 ENCOUNTER — Other Ambulatory Visit: Payer: Self-pay

## 2021-06-01 VITALS — BP 112/62 | HR 68 | Ht 59.0 in | Wt 135.0 lb

## 2021-06-01 DIAGNOSIS — R413 Other amnesia: Secondary | ICD-10-CM

## 2021-06-01 NOTE — Progress Notes (Signed)
[facility]  PATIENT: Valerie Barker DOB: 1922/06/21  REFERRING CLINICIAN: Deland Pretty, MD  HISTORY FROM: patient and son REASON FOR VISIT: new consult / existing patient   HISTORICAL  CHIEF COMPLAINT:  Chief Complaint  Patient presents with   New Patient (Initial Visit)    Rm 6 with son Nicki Reaper; pt reports she is here to discuss RLS     HISTORY OF PRESENT ILLNESS:   UPDATE (06/01/21, VRP): Since last visit, here for evaluation of memory loss, since last 6-12 months. Mainly forgetting other residents names, repeating stories, may forget meds and appts. No alleviating or aggravating factors. RLS sxs are stable on ropinirole 1.5mg  / 2mg .  UPDATE (06/05/18, VRP): Since last visit, patient returns for TIA follow-up.  Patient had episode of left field cut and left neglect, left facial droop in January 2020.  CT, CTA, MRI were unremarkable.  Echocardiogram, LDL and A1c were unremarkable.  Patient was recommended to use dual antiplatelet aspirin plus Plavix for 3 months and then reduce to plavix alone.  Patient did not start aspirin however for some reason. Patient was also recommended to have 30-day cardiac monitoring as outpatient to rule out atrial fibrillation --> no atrial fibrillation was found  PRIOR HPI (06/23/17): 88 year old female here for evaluation of restless leg syndrome, fatigue, low back pain, headaches.  Patient has history of restless leg syndrome for many years, managed on gabapentin and ropinirole.  Symptoms were poorly controlled a few months ago but have improved now.  Patient is satisfied with her current medication.  She has history of low B12 level.  Patient asking about generalized fatigue over the last 2-3 years.  Patient also has been widowed for last 3 years after her husband with dementia passed away.  She has decreased interest and activity to do on daily basis.  Patient also has low back pain rating to the right hip and leg since  December 2018.  She has been going through physical therapy with mild relief.  She also uses tramadol and Tylenol with mild relief.  She had MRI of the lumbar spine which showed disc protrusion at L3-4 level towards the right side.  She is continuing to pursue conservative treatments.  Patient also having new onset of headache, mainly on the right side, for past 1 month.  Sometimes headache is quite severe and hits her all of a sudden.  At other times she has a dull sensation on the right side.  Sometimes she sees some abnormal vision sensations with these headaches.  Patient is concerned about these headaches as she has never had anything like this before in her life.   REVIEW OF SYSTEMS: Full 14 system review of systems performed and negative with exception of: as per HPI.  ALLERGIES: Allergies  Allergen Reactions   Duloxetine Other (See Comments)    unknown   Erythromycin    Mirtazapine     Other reaction(s): Other (See Comments) unknown   Trazodone     Other reaction(s): Other (See Comments) unknown   Zolpidem     Other reaction(s): Other (See Comments) unknown   Cyclobenzaprine Anxiety    Unknown   Diazepam Rash    unknown    HOME MEDICATIONS: Outpatient Medications Prior to Visit  Medication Sig Dispense Refill   atorvastatin (LIPITOR) 10 MG tablet TAKE 1/2 TABLET EVERY DAY. 135 tablet 3   Biotin 10000 MCG TABS Take 10,000 mcg by mouth every evening.      Calcium Carbonate-Vitamin D (  CALCIUM-D PO) Take 1 tablet by mouth every evening. 1200/500     Cholecalciferol (VITAMIN D) 2000 UNITS CAPS Take 2,000 Units by mouth every morning.      clopidogrel (PLAVIX) 75 MG tablet Take 75 mg by mouth daily.     Cyanocobalamin (VITAMIN B-12 PO) Take 2,500 mcg by mouth every morning.      hydrochlorothiazide (HYDRODIURIL) 12.5 MG tablet TAKE 1 TABLET DAILY ALTERNATING WITH 2 TABS NEXT DAY 90 tablet 3   levothyroxine (SYNTHROID) 25 MCG tablet Take 25 mcg by mouth daily before breakfast.       Multiple Vitamin (MULTIVITAMIN) capsule Take 1 capsule by mouth every morning.     olmesartan (BENICAR) 20 MG tablet Take 1 tablet (20 mg total) by mouth daily. 90 tablet 3   penicillin v potassium (VEETID) 500 MG tablet Take 3 tablets by mouth daily.     rOPINIRole (REQUIP) 1 MG tablet Take 1-2 mg by mouth See admin instructions. Takes 1 at evening, 2 at bedtime     No facility-administered medications prior to visit.    PAST MEDICAL HISTORY: Past Medical History:  Diagnosis Date   Heart disease    Hypertension    IBS (irritable bowel syndrome)    Stroke ([place]) 04/17/2018   around 2011   TIA (transient ischemic attack)     PAST SURGICAL HISTORY: Past Surgical History:  Procedure Laterality Date   ABDOMINAL HYSTERECTOMY  1978   BUNIONECTOMY  2006, 1996   LAPAROSCOPIC SALPINGO OOPHERECTOMY  1988    FAMILY HISTORY: Family History  Problem Relation Age of Onset   COPD Mother    Schizophrenia Brother    Stroke Father    Breast cancer Neg Hx     SOCIAL HISTORY:  Social History   Socioeconomic History   Marital status: Married    Spouse name: Not on file   Number of children: Not on file   Years of education: Not on file   Highest education level: Not on file  Occupational History   Not on file  Tobacco Use   Smoking status: Never   Smokeless tobacco: Never  Vaping Use   Vaping Use: Never used  Substance and Sexual Activity   Alcohol use: No   Drug use: No   Sexual activity: Not on file  Other Topics Concern   Not on file  Social History Narrative   Lives in [place].  Education BA degree.  Retired.  3 children.  Son, [place].  Caffeine 1=2 cups daily.   Social Determinants of Health   Financial Resource Strain: Not on file  Food Insecurity: Not on file  Transportation Needs: Not on file  Physical Activity: Not on file  Stress: Not on file  Social Connections: Not on file  Intimate Partner Violence: Not on file      PHYSICAL EXAM  GENERAL EXAM/CONSTITUTIONAL: Vitals:  Vitals:   06/01/21 1434  BP: 112/62  Pulse: 68  SpO2: 98%  Weight: 135 lb (61.2 kg)  Height: 4\' 11"  (1.499 m)   Body mass index is 27.27 kg/m. No results found. Patient is in no distress; well developed, nourished and groomed; neck is supple  CARDIOVASCULAR: Examination of carotid arteries is normal; no carotid bruits Regular rate and rhythm, no murmurs Examination of peripheral vascular system by observation and palpation is normal  EYES: Ophthalmoscopic exam of optic discs and posterior segments is normal; no papilledema or hemorrhages  MUSCULOSKELETAL: Gait, strength, tone, movements noted in Neurologic exam below  NEUROLOGIC: MENTAL STATUS:  MMSE - Mini Mental State Exam 06/01/2021  Orientation to time 5  Orientation to Place 5  Registration 3  Attention/ Calculation 5  Recall 2  Language- name 2 objects 2  Language- repeat 1  Language- follow 3 step command 2  Language- read & follow direction 1  Write a sentence 1  Copy design 0  Total score 27   awake, alert, oriented to person, place and time recent and remote memory intact normal attention and concentration language fluent, comprehension intact, naming intact,  fund of knowledge appropriate  CRANIAL NERVE:  2nd - no papilledema on fundoscopic exam 2nd, 3rd, 4th, 6th - pupils equal and reactive to light, visual fields full to confrontation, extraocular muscles intact, no nystagmus 5th - facial sensation symmetric 7th - facial strength symmetric 8th - hearing intact 9th - palate elevates symmetrically, uvula midline 11th - shoulder shrug symmetric 12th - tongue protrusion midline  MOTOR:  normal bulk and tone, full strength in the BUE, BLE  SENSORY:  normal and symmetric to light touch, temperature, vibration; DECR VIBRATION AT ANKLES AND TOES  COORDINATION:  finger-nose-finger, fine finger movements normal  REFLEXES:  deep tendon  reflexes TRACE and symmetric  GAIT/STATION:  narrow based gait; SLOW CAUTIOUS GAIT; USES WALKER    DIAGNOSTIC DATA (LABS, IMAGING, TESTING) - I reviewed patient records, labs, notes, testing and imaging myself where available.  Lab Results  Component Value Date   WBC 7.2 07/12/2019   HGB 12.7 07/12/2019   HCT 38.2 07/12/2019   MCV 91 07/12/2019   PLT 190 07/12/2019      Component Value Date/Time   NA 138 12/12/2019 1609   K 4.6 12/12/2019 1609   CL 100 12/12/2019 1609   CO2 27 12/12/2019 1609   GLUCOSE 97 12/12/2019 1609   GLUCOSE 104 (H) 04/19/2018 0621   BUN 18 12/12/2019 1609   CREATININE 0.94 12/12/2019 1609   CALCIUM 10.3 12/12/2019 1609   PROT 5.7 (L) 04/19/2018 0621   ALBUMIN 3.3 (L) 04/19/2018 0621   AST 25 04/19/2018 0621   ALT 20 04/19/2018 0621   ALKPHOS 42 04/19/2018 0621   BILITOT 0.8 04/19/2018 0621   GFRNONAA 52 (L) 12/12/2019 1609   GFRAA 60 12/12/2019 1609   Lab Results  Component Value Date   CHOL 128 04/18/2018   HDL 67 04/18/2018   LDLCALC 48 04/18/2018   TRIG 67 04/18/2018   CHOLHDL 1.9 04/18/2018   Lab Results  Component Value Date   HGBA1C 5.6 04/18/2018   Lab Results  Component Value Date   VITAMINB12 164 (L) 04/29/2009   Lab Results  Component Value Date   TSH 5.800 (H) 07/12/2019    01/28/13 MRI HEAD    - No acute intracranial findings are evident. There is advanced atrophy with chronic microvascular ischemic change.   01/28/13 MRA HEAD  - No intracranial flow reducing lesion is evident.  05/20/17 MRI lumbar spine [I reviewed images myself and agree with interpretation. -VRP] - Moderately large right-sided disc protrusion L3-4 with impingement of the right L3 nerve root in the subarticular zone. Mild spinal stenosis. - Grade 1 anterolisthesis L4-5 with mild spinal stenosis.  07/05/17 MRI brain - Moderate generalized atrophy.  - Mild-moderate chronic small vessel ischemic disease. - No acute findings. - No significant  change from prior MRI on 01/28/13.   04/17/18 CTA head / neck - No large or medium vessel occlusion or correctable proximal stenosis.  04/18/18 MRI brain  1. No  acute intracranial abnormality. 2. Extensive chronic small vessel ischemic disease.  05/03/18 - 30 day heart monitor - normal sinus rhythm; no atrial fibrillation  04/18/18 echocardiogram - Left ventricle: The cavity size was normal. Wall thickness was   normal. Systolic function was normal. The estimated ejection   fraction was in the range of 60% to 65%. Wall motion was normal;   there were no regional wall motion abnormalities. Features are   consistent with a pseudonormal left ventricular filling pattern,   with concomitant abnormal relaxation and increased filling   pressure (grade 2 diastolic dysfunction).     ASSESSMENT AND PLAN  88 y.o. year old female here with:  Dx:  1. Memory loss     PLAN:  MILD MEMORY LOSS (possible mild cognitive impairment; mild dementia possible but less likely; MMSE 27/30; mild changes in ADLs) - safety / supervision issues reviewed - daily physical activity / exercise (at least 15-30 minutes) - continue social activities, brain stimulation, games, puzzles, hobbies, crafts, arts, music - aim for at least 7-8 hours sleep per night (or more) - caution with medications, finances; no driving  TIA (Jan 7829) - continue plavix 75mg  daily  RESTLESS LEGS (stable) - continue ropinirole - optimize B12, iron levels  FATIGUE - optimize nutrition, exercise, sleep, routine - follow up B12 level (with PCP)  RIGHT LOWER BACK PAIN / HIP PAIN - continue conservative mgmt / PT  Return for pending if symptoms worsen or fail to improve.  I spent 40 minutes of face-to-face and non-face-to-face time with patient.  This included previsit chart review, lab review, study review, order entry, electronic health record documentation, patient education.     Penni Bombard, MD 5/62/1308, 6:57  PM Certified in Neurology, Neurophysiology and Neuroimaging  [facility] [address] [place], [place] [address] [phone]

## 2021-06-01 NOTE — Patient Instructions (Signed)
°  MILD MEMORY LOSS - mild memory loss; possible mild cognitive impairment vs mild dementia - safety / supervision issues reviewed - daily physical activity / exercise (at least 15-30 minutes) - continue social activities, brain stimulation, games, puzzles, hobbies, crafts, arts, music - aim for at least 7-8 hours sleep per night (or more) - caregiver resources provided - caution with medications, finances; no driving

## 2021-06-04 ENCOUNTER — Other Ambulatory Visit: Payer: Self-pay | Admitting: Cardiovascular Disease

## 2021-06-25 ENCOUNTER — Other Ambulatory Visit: Payer: Self-pay | Admitting: Cardiovascular Disease

## 2021-07-06 ENCOUNTER — Emergency Department (HOSPITAL_COMMUNITY): Payer: Medicare Other

## 2021-07-06 ENCOUNTER — Other Ambulatory Visit: Payer: Self-pay

## 2021-07-06 ENCOUNTER — Emergency Department (HOSPITAL_COMMUNITY)
Admission: EM | Admit: 2021-07-06 | Discharge: 2021-07-06 | Disposition: A | Discharge status: Home / Self Care | Payer: Medicare Other | Attending: Emergency Medicine | Admitting: Emergency Medicine

## 2021-07-06 DIAGNOSIS — S51811A Laceration without foreign body of right forearm, initial encounter: Secondary | ICD-10-CM | POA: Diagnosis not present

## 2021-07-06 DIAGNOSIS — R9082 White matter disease, unspecified: Secondary | ICD-10-CM | POA: Diagnosis not present

## 2021-07-06 DIAGNOSIS — W19XXXA Unspecified fall, initial encounter: Secondary | ICD-10-CM | POA: Diagnosis not present

## 2021-07-06 DIAGNOSIS — M79643 Pain in unspecified hand: Secondary | ICD-10-CM | POA: Diagnosis not present

## 2021-07-06 DIAGNOSIS — S0990XA Unspecified injury of head, initial encounter: Secondary | ICD-10-CM | POA: Insufficient documentation

## 2021-07-06 DIAGNOSIS — Z79899 Other long term (current) drug therapy: Secondary | ICD-10-CM | POA: Diagnosis not present

## 2021-07-06 DIAGNOSIS — W07XXXA Fall from chair, initial encounter: Secondary | ICD-10-CM | POA: Insufficient documentation

## 2021-07-06 DIAGNOSIS — M25511 Pain in right shoulder: Secondary | ICD-10-CM | POA: Diagnosis not present

## 2021-07-06 DIAGNOSIS — M4312 Spondylolisthesis, cervical region: Secondary | ICD-10-CM | POA: Diagnosis not present

## 2021-07-06 DIAGNOSIS — Z7902 Long term (current) use of antithrombotics/antiplatelets: Secondary | ICD-10-CM | POA: Insufficient documentation

## 2021-07-06 DIAGNOSIS — Z743 Need for continuous supervision: Secondary | ICD-10-CM | POA: Diagnosis not present

## 2021-07-06 DIAGNOSIS — G319 Degenerative disease of nervous system, unspecified: Secondary | ICD-10-CM | POA: Diagnosis not present

## 2021-07-06 DIAGNOSIS — G4489 Other headache syndrome: Secondary | ICD-10-CM | POA: Diagnosis not present

## 2021-07-06 DIAGNOSIS — S61511A Laceration without foreign body of right wrist, initial encounter: Secondary | ICD-10-CM | POA: Diagnosis not present

## 2021-07-06 DIAGNOSIS — S59911A Unspecified injury of right forearm, initial encounter: Secondary | ICD-10-CM | POA: Diagnosis present

## 2021-07-06 NOTE — Progress Notes (Signed)
Orthopedic Tech Progress Note ?Patient Details:  ?Dixie Dials ?[birth year] ?[id number] ? ?Level 2 trauma  ? ?Patient ID: Beautifull Cisar, female   DOB: 1922/10/24, 88 y.o.   MRN: [id number] ? ?Janit Pagan ?07/06/2021, 3:35 PM ? ?

## 2021-07-06 NOTE — ED Notes (Signed)
Patient transported to CT 

## 2021-07-06 NOTE — ED Provider Notes (Signed)
?[facility] ?Provider Note ? ? ?CSN: [id number] ?Arrival date & time: 07/06/21  1324 ? ?  ? ?History ? ?Chief Complaint  ?Patient presents with  ? level 2 / fall  ? ? ?Valerie Barker is a 88 y.o. female. ? ?88 year old female with prior medical history as detailed below presents for evaluation.  Patient reports that she fell.  She apparently slid out of her chair.  She did strike her head.  She did not pass out.  She has a superficial skin abrasion to the right hand.  She denies any specific pain to the right hand.  She complains of some mild pain to the lateral and posterior aspect of the right shoulder. She takes Plavix.  ? ?The history is provided by the patient.  ?Fall ?This is a new problem. The current episode started 1 to 2 hours ago. The problem occurs every several days. The problem has not changed since onset.Nothing aggravates the symptoms. Nothing relieves the symptoms.  ? ?  ? ?Home Medications ?Prior to Admission medications   ?Medication Sig Start Date End Date Taking? Authorizing Provider  ?atorvastatin (LIPITOR) 10 MG tablet TAKE 1/2 TABLET EVERY DAY. 12/15/20   Troy Sine, MD  ?Biotin 10000 MCG TABS Take 10,000 mcg by mouth every evening.     [provider]  ?Calcium Carbonate-Vitamin D (CALCIUM-D PO) Take 1 tablet by mouth every evening. 1200/500    [provider]  ?Cholecalciferol (VITAMIN D) 2000 UNITS CAPS Take 2,000 Units by mouth every morning.     [provider]  ?clopidogrel (PLAVIX) 75 MG tablet Take 75 mg by mouth daily.    [provider]  ?Cyanocobalamin (VITAMIN B-12 PO) Take 2,500 mcg by mouth every morning.     [provider]  ?hydrochlorothiazide (HYDRODIURIL) 12.5 MG tablet TAKE 1 TO 2 TABLETS EVERY DAY 06/25/21   Troy Sine, MD  ?levothyroxine (SYNTHROID) 25 MCG tablet Take 25 mcg by mouth daily before breakfast.  04/01/19   [provider]  ?Multiple Vitamin  (MULTIVITAMIN) capsule Take 1 capsule by mouth every morning.    [provider]  ?olmesartan (BENICAR) 20 MG tablet Take 1 tablet (20 mg total) by mouth daily. 06/24/20   Troy Sine, MD  ?penicillin v potassium (VEETID) 500 MG tablet Take 3 tablets by mouth daily. 12/05/19   [provider]  ?rOPINIRole (REQUIP) 1 MG tablet Take 1-2 mg by mouth See admin instructions. Takes 1 at evening, 2 at bedtime 05/26/18   [provider]  ?   ? ?Allergies    ?Duloxetine, Erythromycin, Mirtazapine, Trazodone, Zolpidem, Cyclobenzaprine, and Diazepam   ? ?Review of Systems   ?Review of Systems  ?All other systems reviewed and are negative. ? ?Physical Exam ?Updated Vital Signs ?BP 121/69 (BP Location: Right Arm)   Pulse 64   Temp 98.4 ?F (36.9 ?C)   Resp 16   Ht '4\' 11"'$  (1.499 m)   Wt 61 kg   SpO2 96%   BMI 27.16 kg/m?  ?Physical Exam ?Vitals and nursing note reviewed.  ?Constitutional:   ?   General: She is not in acute distress. ?   Appearance: Normal appearance. She is well-developed.  ?HENT:  ?   Head: Normocephalic and atraumatic.  ?Eyes:  ?   Conjunctiva/sclera: Conjunctivae normal.  ?   Pupils: Pupils are equal, round, and reactive to light.  ?Cardiovascular:  ?   Rate and Rhythm: Normal rate and regular rhythm.  ?  Heart sounds: Normal heart sounds.  ?Pulmonary:  ?   Effort: Pulmonary effort is normal. No respiratory distress.  ?   Breath sounds: Normal breath sounds.  ?Abdominal:  ?   General: There is no distension.  ?   Palpations: Abdomen is soft.  ?   Tenderness: There is no abdominal tenderness.  ?Musculoskeletal:     ?   General: No deformity. Normal range of motion.  ?   Cervical back: Normal range of motion and neck supple.  ?Skin: ?   General: Skin is warm and dry.  ?   Comments: Superficial skin tear to the right forearm and wrist.  ?Neurological:  ?   General: No focal deficit present.  ?   Mental Status: She is alert and oriented to person, place, and time. Mental status is  at baseline.  ?   Cranial Nerves: No cranial nerve deficit.  ?   Sensory: No sensory deficit.  ?   Motor: No weakness.  ?   Coordination: Coordination normal.  ? ? ?ED Results / Procedures / Treatments   ?Labs ?(all labs ordered are listed, but only abnormal results are displayed) ?Labs Reviewed - No data to display ? ?EKG ?None ? ?Radiology ?CT Head Wo Contrast ? ?Result Date: 07/06/2021 ?CLINICAL DATA:  Head trauma, moderate to severe head trauma EXAM: CT HEAD WITHOUT CONTRAST TECHNIQUE: Contiguous axial images were obtained from the base of the skull through the vertex without intravenous contrast. RADIATION DOSE REDUCTION: This exam was performed according to the departmental dose-optimization program which includes automated exposure control, adjustment of the mA and/or kV according to patient size and/or use of iterative reconstruction technique. COMPARISON:  None. FINDINGS: Brain: No acute intracranial hemorrhage. No focal mass lesion. No CT evidence of acute infarction. No midline shift or mass effect. No hydrocephalus. Basilar cisterns are patent. There are periventricular and subcortical white matter hypodensities. Generalized cortical atrophy. Vascular: No hyperdense vessel or unexpected calcification. Skull: Normal. Negative for fracture or focal lesion. Sinuses/Orbits: Paranasal sinuses and mastoid air cells are clear. Orbits are clear. Other: None. IMPRESSION: 1. No intracranial trauma. 2. Atrophy and white matter microvascular disease. Electronically Signed   By: Suzy Bouchard M.D.   On: 07/06/2021 14:47  ? ?CT Cervical Spine Wo Contrast ? ?Result Date: 07/06/2021 ?CLINICAL DATA:  Neck trauma in a 88 year old female. EXAM: CT CERVICAL SPINE WITHOUT CONTRAST TECHNIQUE: Multidetector CT imaging of the cervical spine was performed without intravenous contrast. Multiplanar CT image reconstructions were also generated. RADIATION DOSE REDUCTION: This exam was performed according to the departmental  dose-optimization program which includes automated exposure control, adjustment of the mA and/or kV according to patient size and/or use of iterative reconstruction technique. COMPARISON:  CT of the head which was acquired on the same date. FINDINGS: Alignment: Accentuation of normal cervical lordotic curvature which is similar to previous imaging. 2 mm anterolisthesis of C4 on C5 seen in the context of degenerative changes. Is neck neck. Skull base and vertebrae: No acute fracture. No primary bone lesion or focal pathologic process. Soft tissues and spinal canal: No prevertebral fluid or swelling. No visible canal hematoma. Disc levels: Multilevel degenerative changes of the cervical spine similar to prior imaging the exception of worsening of LEFT facet arthropathy which is greatest at C3-4 and C4-5. Arthropathy on the LEFT at these levels associated with substantial change from the prior study with irregular subcortical bone and osteophytes, no discrete erosions. Widening of the C3-4 facet on the LEFT all favored to  be related to worsening of arthropathy. Degenerative changes also at C1-2 with degenerative pannus similar to previous imaging. Upper chest: Negative. Other: None IMPRESSION: 1. No acute fracture or traumatic malalignment in the cervical spine. 2. Multilevel degenerative changes of the cervical spine similar to prior imaging the exception of worsening of LEFT facet arthropathy which is greatest at C3-4 and C4-5. This worsening of arthropathy is associated with widening of the facet joints which based on appearance could be related to either osteoarthritis or gout. Electronically Signed   By: Zetta Bills M.D.   On: 07/06/2021 15:26   ? ?Procedures ?Procedures  ? ? ?Medications Ordered in ED ?Medications - No data to display ? ?ED Course/ Medical Decision Making/ A&P ?  ?                        ?Medical Decision Making ?Amount and/or Complexity of Data Reviewed ?Radiology: ordered. ? ? ? ?Medical  Screen Complete ? ?This patient presented to the ED with complaint of fall / head injury. ? ?This complaint involves an extensive number of treatment options. The initial differential diagnosis includes, but is not limited to, sign

## 2021-07-06 NOTE — ED Triage Notes (Signed)
EMS stated, pt  fell around 1213 and has a skin abrasion to her rt. Hand on top and hit her head on the rt. Side. No LOC.  ?Pt. Stated I slipped off the chair. Pt takes Plavix  ?

## 2021-07-06 NOTE — Discharge Instructions (Signed)
Return for any problem. ? ?Use Tylenol as instructed if required for pain. ?

## 2021-07-06 NOTE — ED Notes (Signed)
Trauma Response Nurse Documentation ? ? ?Valerie Barker is a 88 y.o. female arriving to [facility] ED via EMS ? ?On clopidogrel 75 mg daily. Trauma was activated as a Level 2 by Charge RN based on the following trauma criteria Elderly patients > 65 with head trauma on anti-coagulation (excluding ASA). Trauma team at the bedside on patient arrival. Patient cleared for CT by Dr. Billy Fischer. Patient to CT with team. GCS 15. ? ?History  ? Past Medical History:  ?Diagnosis Date  ? Heart disease   ? Hypertension   ? IBS (irritable bowel syndrome)   ? Stroke ([place]) 04/17/2018  ? around 2011  ? TIA (transient ischemic attack)   ?  ? Past Surgical History:  ?Procedure Laterality Date  ? ABDOMINAL HYSTERECTOMY  1978  ? BUNIONECTOMY  2006, 1996  ? LAPAROSCOPIC SALPINGO OOPHERECTOMY  1988  ?  ? ? ?Initial Focused Assessment (If applicable, or please see trauma documentation): ?- GCS 15.  ?- Abrasion to R hand ?- c-collar in place ?- PERRLA ? ?CT's Completed:   ?CT Head and CT C-Spine  ? ?Interventions:  ?- PIV ?- CT head and neck ?- R shoulder XR ? ?Plan for disposition:  ?Discharge home  ? ?Consults completed:  ?none at 1500. ? ?Event Summary: ?EMS stated, pt  fell around 1213 and has a skin abrasion to her R hand on top and hit the R side of her head. No LOC.  ?Pt. Stated I slipped off the chair. Pt takes Plavix  ?MTP Summary (If applicable):  ? ?Bedside handoff with ED RN Emilee.   ? ?Dulcy Fanny W  ?Trauma Response RN ? ?Please call TRN at [phone] for further assistance. ? ? ?

## 2021-07-26 DIAGNOSIS — H5052 Exophoria: Secondary | ICD-10-CM | POA: Diagnosis not present

## 2021-07-26 DIAGNOSIS — Z961 Presence of intraocular lens: Secondary | ICD-10-CM | POA: Diagnosis not present

## 2021-08-23 DIAGNOSIS — G2581 Restless legs syndrome: Secondary | ICD-10-CM | POA: Diagnosis not present

## 2021-08-25 ENCOUNTER — Encounter: Payer: Self-pay | Admitting: Cardiovascular Disease

## 2021-08-25 ENCOUNTER — Ambulatory Visit: Payer: Medicare Other | Admitting: Cardiovascular Disease

## 2021-08-25 VITALS — BP 108/58 | HR 73 | Ht 59.0 in | Wt 135.8 lb

## 2021-08-25 DIAGNOSIS — M7989 Other specified soft tissue disorders: Secondary | ICD-10-CM | POA: Diagnosis not present

## 2021-08-25 DIAGNOSIS — Z8673 Personal history of transient ischemic attack (TIA), and cerebral infarction without residual deficits: Secondary | ICD-10-CM

## 2021-08-25 DIAGNOSIS — G2581 Restless legs syndrome: Secondary | ICD-10-CM | POA: Diagnosis not present

## 2021-08-25 DIAGNOSIS — E039 Hypothyroidism, unspecified: Secondary | ICD-10-CM | POA: Diagnosis not present

## 2021-08-25 DIAGNOSIS — L814 Other melanin hyperpigmentation: Secondary | ICD-10-CM | POA: Diagnosis not present

## 2021-08-25 DIAGNOSIS — I10 Essential (primary) hypertension: Secondary | ICD-10-CM

## 2021-08-25 DIAGNOSIS — L821 Other seborrheic keratosis: Secondary | ICD-10-CM | POA: Diagnosis not present

## 2021-08-25 DIAGNOSIS — L820 Inflamed seborrheic keratosis: Secondary | ICD-10-CM | POA: Diagnosis not present

## 2021-08-25 NOTE — Progress Notes (Signed)
Patient ID: Valerie Barker, female   DOB: 1922-11-09, 88 y.o.   MRN: [id number]     HPI: [name] is a 88 y.o. female who presents to the office for a 10 month cardiology followup evaluation.  [name] is the wife of my former patient [name] who died on 03/31/2015.  Remotely, [name] suffered a TIA and has documented old asymptomatic lacunar infarcts. Prior to undergoing urologic surgery at [facility] in 2012 a nuclear perfusion study showed normal perfusion. An echo Doppler study demonstrated mild to moderate TR, trace MR, and mild aortic sclerosis without stenosis. She had normal systolic function. Carotid studies done in 2011 were normal.  She was hospitalized for 2 nights after experiencing an TIA on 01/08/2013. Her symptoms resolved spontaneously. A CT of her head showed mild atrophy with extensive supratentorial small vessel disease without intracranial mass, hemorrhage or acute appearing infarction. An MRI of her brain did not show any acute intracranial findings but did show advanced atrophy with chronic microvascular ischemic changes. MRA did not reveal any intracranial flow reducing lesions. During that hospitalization a f/u echo Doppler study  on 01/29/2013 showed an ejection fraction at 65%. There was mild aortic sclerosis without stenosis. Carotid Doppler study showed minimal plaque with less than 39% reduction. She  was sent home on atorvastatin 10 mg Plavix 75 mg in addition to her valsartan HCT 320/12.5 and zolpidem.  She has had issues with swelling of legs and feet.  She also has continued issues with restless legs.  She has been taking requip 0.5 mg shortly before going to bed, but it takes a while for the medication to work and usually does not completely suffice.  At times she has taken a half a pill if she is to have a daytime nap.  When I last saw her, she was complaining of experiencing a "bobble" sensation.  She underwent carotid duplex imaging  which was normal in November 2016.  She denies any episodes of chest pain.  She is unaware of palpitations.  She admits to being fatigued , but actually feels improved from previously.  At times if she is on her back for certain duration.  She does note transient right arm paresthesias.  She denies associated chest pressure.  She presents for evaluation.  She underwent extensive dental work with her dentist, Transport planner, and endodontist.  She also had eyelid lift surgery at  [facility] in December 2017 and tolerated this well from a cardiovascular standpoint.  When I last saw she felt well was experiencing significant tiredness and fatigue.  At times she noted  some dizziness with movement.  She has restless legs for which she is on requip in addition to gabapentin.  She denied chest pain.  Was unaware of palpitations.  I  saw her in May 2019 following an episode of dizziness.  As she was getting off the toilet and was trying to get or toilet paper she bent over into the cabinet and when she stood up she became dizzy lightheaded fell backwards and hit her head.  Since she was on chronic Plavix therapy she presented to the emergency room for evaluation.  No acute abnormality was noted.  However, her pulse in the emergency room was 52 and blood pressure 107/41.  She has been on losartan HCT 100/25 mg, Toprol-XL 12.5 mg and this has been held if her heart rate is less than 55.  I reviewed her emergency room evaluation.  At that time, I elected to wean and discontinue her very low-dose Toprol-XL 12.5 mg.    I saw her in September 2019. Unfortunately her 14 year old sister passed away 4 weeks ago.  She had spoken to her sister on a daily basis.  Ms. Sarr continues to live in independent living at [facility].  She denies chest pain.  She is unaware of palpitations.  She has not required use of HCTZ.  Continues to be on valsartan 320 mg for hypertension.  She is tolerating atorvastatin for  hyperlipidemia.   She has continued to live in independent living at [facility].  I last evaluated her in a telemedicine visit in January 2021.  At that time she denied  any chest pain or shortness of breath or palpitations.  She admits to significant fatigability.  She believes she is sleeping a lot of the time.  She does sleep well at night.  She has not been as active as she had in the past due to the COVID-19 pandemic.  She was concerned about the possibility of early Alzheimer's disease since she is starting to notice some short-term memory loss.  She also admits to swelling in her right foot and was given HCTZ 25 mg by Dr. Shelia Media..   She was evaluated by Almyra Deforest, PA in April 2021.  At that time she was taking HCTZ 25 mg daily rather than as needed as result of leg swelling.  He was concerned that her dependent edema was due to venous stasis and recommended she change this back to just as needed.  He scheduled her for an echo Doppler study which was done on 08/11/2019 which showed an EF of 60 to 65%.  There is grade 1 diastolic dysfunction.  There were no wall motion abnormalities.  She had mildly increased RV systolic pressure 35 mg.  There was mild aortic sclerosis without stenosis.     She was evaluated by Dr. Maureen Chatters of [facility] neurology.  She had previously seen Dr. Leta Baptist and has been on treatment for restless leg syndrome.  She was evaluated for memory issues but due to complaints of significant fatigability and sleepiness she was referred for a sleep study at their office which was done on 11/03/2019.  This apparently showed mild overall sleep apnea with an AHI of 10.5/h.  AHI during REM sleep was 0.  There was concern that she may have had heart rate irregularity throughout the test and her cyclic apnea seem to correlate with her irregular heart rate and rhythm.  There was concern of whether or not she had atrial fibrillation and cardiology evaluation was advised.  She was  evaluated in the  emergency room after a mechanical fall when she was walking with her walker and tripped between the transition of the hardwood floor on carpet.  She sustained injury to her right thumb and apparently is now wearing a thumb splint for small fracture.  I saw her in September 2021 at which time she denied any chest pain or shortness of breath.  She admits to fatigue and daytime sleepiness.  She was taking hydrochlorothiazide daily, olmesartan 40 mg, and requipfor her restless legs.  She also is on atorvastatin for hyperlipidemia and levothyroxine for hypothyroidism.  She is unaware of any palpitations.  Of note, in the past when very low-dose beta-blocker therapy was instituted she did get bradycardic in the low 50s.  During that evaluation, I reviewed her sleep study and felt she most likely had frequent PACs.  I saw her in December 2021.  She continued to live in independent living at [facility].  She denies any chest pain or significant shortness of breath.  She did experience occasional feet and ankle swelling.  She admitted to being tired frequently but was sleeping well.  With her feet and ankle edema I suggested over the next 3 days she increase her HCT dose to 25 mg and depending upon swelling to take either 12.5 mg and every now and then an extra dose for 25 mg perhaps every third day as needed.  She continued to be on olmesartan 40 mg for blood pressure and was on levothyroxine.  She continued to be on Plavix with her history of TIA and was on ropinirole for her restless leg syndrome.  I saw her on June 24, 2020.  At that time she admitted to being very tired and could not sleep at any time.  She was sleeping well at night.  She has a caregiver in the afternoon from noon until 5 PM.  She also has started physical therapy.  Her physical therapist had noted some mild heart rate irregularity and our office was notified.  She denied any chest pain.  She denied any dizziness.  Her last echo Doppler study  in May 2021 showed an EF of 60 to 65% with grade 1 diastolic dysfunction, mild aortic sclerosis without stenosis and normal right heart pressures.  There was mild concentric LVH.  During that evaluation her blood pressure was low and a repeat by me was 110/60.  I suggested she decrease olmesartan from 40 mg down to 20 mg and continue the HCTZ as prescribed for leg edema.  Her ECG showed isolated PVCs with a ventricular rate at 60.  However with her prior beta-blocker induced bradycardia I did not recommend reinstitution of beta-blockade in the setting of normal LV function.  I last saw her in July 2022.  She continues to live in independent living at [facility].  She has a caregiver through [facility] and she states the caregivers have been excellent.  She is able to walk with a walker.  She denies any chest pain.  She does note mild shortness of breath if she tries to walk fast but otherwise feels great.  She is sleeping well.  She denies any dizziness.  She has not had any recent falls.  She is unaware of palpitations.   Presently, she continues to feel well.  She admits to taking frequent naps during the day.  She often goes to bed at midnight and wakes up at 9 AM.  Her caregiver is with her from noon until 5 PM.  Since last July 2022 she fell on 1 occasion and over the last 3 years had 3 falls which often occurred by tripping over her rug.  She sees Dr. Deland Pretty for primary care.  She denies any chest pain or palpitations.  She presents for evaluation.   Past Medical History:  Diagnosis Date   Heart disease    Hypertension    IBS (irritable bowel syndrome)    Stroke ([place]) 04/17/2018   around 2011   TIA (transient ischemic attack)     Past Surgical History:  Procedure Laterality Date   ABDOMINAL HYSTERECTOMY  1978   BUNIONECTOMY  2006, [place]    Allergies  Allergen Reactions   Duloxetine Other (See Comments)    unknown   Erythromycin     Mirtazapine  Other reaction(s): Other (See Comments) unknown   Trazodone     Other reaction(s): Other (See Comments) unknown   Zolpidem     Other reaction(s): Other (See Comments) unknown   Cyclobenzaprine Anxiety    Unknown   Diazepam Rash    unknown    Current Outpatient Medications  Medication Sig Dispense Refill   atorvastatin (LIPITOR) 10 MG tablet TAKE 1/2 TABLET EVERY DAY. 135 tablet 3   Biotin 10000 MCG TABS Take 10,000 mcg by mouth every evening.      Calcium Carbonate-Vitamin D (CALCIUM-D PO) Take 1 tablet by mouth every evening. 1200/500     Cholecalciferol (VITAMIN D) 2000 UNITS CAPS Take 2,000 Units by mouth every morning.      clopidogrel (PLAVIX) 75 MG tablet Take 75 mg by mouth daily.     Cyanocobalamin (VITAMIN B-12 PO) Take 2,500 mcg by mouth every morning.      hydrochlorothiazide (HYDRODIURIL) 12.5 MG tablet TAKE 1 TO 2 TABLETS EVERY DAY 90 tablet 1   levothyroxine (SYNTHROID) 25 MCG tablet Take 25 mcg by mouth daily before breakfast.      Multiple Vitamin (MULTIVITAMIN) capsule Take 1 capsule by mouth every morning.     olmesartan (BENICAR) 20 MG tablet Take 1 tablet (20 mg total) by mouth daily. 90 tablet 3   penicillin v potassium (VEETID) 500 MG tablet Take 3 tablets by mouth daily.     rOPINIRole (REQUIP) 1 MG tablet Take 1-2 mg by mouth See admin instructions. Takes 1 at evening, 2 at bedtime     No current facility-administered medications for this visit.    Social History   Socioeconomic History   Marital status: Married    Spouse name: Not on file   Number of children: Not on file   Years of education: Not on file   Highest education level: Not on file  Occupational History   Not on file  Tobacco Use   Smoking status: Never   Smokeless tobacco: Never  Vaping Use   Vaping Use: Never used  Substance and Sexual Activity   Alcohol use: No   Drug use: No   Sexual activity: Not on file  Other Topics Concern   Not on file  Social History  Narrative   Lives in [place] at [place].  Education BA degree.  Retired.  3 children.  Son, [place].  Caffeine 1=2 cups daily.   Social Determinants of Health   Financial Resource Strain: Not on file  Food Insecurity: Not on file  Transportation Needs: Not on file  Physical Activity: Not on file  Stress: Not on file  Social Connections: Not on file  Intimate Partner Violence: Not on file   Socially she is widowed as of December 2016.  Her husband was a former Scientist, physiological of education that [employer] and developed progressive dementia Prior to his death  Family History  Problem Relation Age of Onset   COPD Mother    Schizophrenia Brother    Stroke Father    Breast cancer Neg Hx    ROS General: Negative; No fevers, chills, or night sweats;  HEENT: Negative; No changes in vision or hearing, sinus congestion, difficulty swallowing Pulmonary: Negative; No cough, wheezing, shortness of breath, hemoptysis Cardiovascular: Negative; No chest pain, presyncope, syncope, palpitations Positive for leg swelling  GI: Negative; No nausea, vomiting, diarrhea, or abdominal pain GU: Positive for recurrent urinary incontinence despite her prior urologic surgery; No dysuria, hematuria, or difficulty voiding Musculoskeletal: History of right thumb  fracture Hematologic/Oncology: Negative; no easy bruising, bleeding Endocrine: Negative; no heat/cold intolerance; no diabetes Neuro: Positive for decreased short-term memory and history of TIA; Episode of right arm numbness in July 2017; no changes in balance, headaches Skin: Negative; No rashes or skin lesions Psychiatric: Negative; No behavioral problems, depression Sleep: Positive for restless leg syndrome; positive for daytime sleepiness no snoring,  bruxism, hypnogognic hallucinations, no cataplexy Other comprehensive 14 point system review is negative.   PE BP (!) 108/58   Pulse 73   Ht $R'4\' 11"'Ln$  (1.499 m)   Wt 135 lb 12.8 oz (61.6 kg)    SpO2 97%   BMI 27.43 kg/m    Repeat blood pressure by me was 122/64  Wt Readings from Last 3 Encounters:  08/25/21 135 lb 12.8 oz (61.6 kg)  07/06/21 134 lb 7.7 oz (61 kg)  06/01/21 135 lb (61.2 kg)   General: Alert, oriented, no distress.  Skin: normal turgor, no rashes, warm and dry HEENT: Normocephalic, atraumatic. Pupils equal round and reactive to light; sclera anicteric; extraocular muscles intact;  Nose without nasal septal hypertrophy Mouth/Parynx benign; Mallinpatti scale 3 Neck: No JVD, no carotid bruits; normal carotid upstroke Lungs: clear to ausculatation and percussion; no wheezing or rales Chest wall: without tenderness to palpitation; Heart: PMI not displaced, RRR, s1 s2 normal, 1/6 systolic murmur, no diastolic murmur, no rubs, gallops, thrills, or heaves Abdomen: soft, nontender; no hepatosplenomehaly, BS+; abdominal aorta nontender and not dilated by palpation. Back: no CVA tenderness; kyphotic Pulses 2+ Musculoskeletal: full range of motion, normal strength, no joint deformities Extremities: Trivial ankle swelling ; no clubbing cyanosis, Homan's sign negative  Neurologic: grossly nonfocal; Cranial nerves grossly wnl Psychologic: Normal mood and affect  Aug 25, 2021 ECG (independently read by me):  Sinus rhythm at 73 with PACs  October 19, 2020 ECG (independently read by me): Sinus bradycardia at 55 bpm.  No ectopy.  Normal intervals.  Low voltage precordial leads  June 24, 2020 ECG (independently read by me): Sinus rhythm at 60, PVCs     March 05, 2020 ECG (independently read by me): NSR at 16, PAC in transient trigeminal pattern  September 2021 ECG (independently read by me): Sinus rhythm with frequent PACs, QTc interval 412 ms.  No ST segment changes   September 2019 ECG (independently read by me): Normal sinus rhythm at 61 bpm.  No ectopy.  Normal intervals.  September 2018 ECG (independently read by me): sinus bradycardia 53 bpm.  Normal intervals.   No ST segment changes.  Decreased voltage anterolaterally  January 2018 ECG (independently read by me): Sinus bradycardia 57 bpm.  No ST segment changes.  There was malpositioning of her anterior leads.  May 2017 ECG (independently read by me): Sinus bradycardia 53 bpm.  No ectopy.  Normal intervals.  No ST segment changes.  November 2016 ECG (independently read by me): Sinus bradycardia 58 bpm.  Normal intervals.    October 2015 ECG (and apparently read by me) : Sinus rhythm with frequent PACs and transient atrial bigeminal pattern  Prior November 2014 ECG: Sinus rhythm with occasional PACs. Intervals are normal. Nonspecific T changes.  LABS:    Latest Ref Rng & Units 12/12/2019    4:09 PM 07/12/2019   11:41 AM 04/19/2018    6:21 AM  BMP  Glucose 65 - 99 mg/dL 97   82   104    BUN 10 - 36 mg/dL $Remove'18   23   13    'yQBDnGL$ Creatinine 0.57 -  1.00 mg/dL 0.94   0.97   0.90    BUN/Creat Ratio 12 - $Re'28 19   24     'PQl$ Sodium 134 - 144 mmol/L 138   140   138    Potassium 3.5 - 5.2 mmol/L 4.6   4.6   3.8    Chloride 96 - 106 mmol/L 100   102   105    CO2 20 - 29 mmol/L $RemoveB'27   25   24    'vnVcyKmu$ Calcium 8.7 - 10.3 mg/dL 10.3   9.2   8.9        Latest Ref Rng & Units 04/19/2018    6:21 AM 04/18/2018    8:47 AM 04/17/2018   12:46 PM  Hepatic Function  Total Protein 6.5 - 8.1 g/dL 5.7   5.9   6.1    Albumin 3.5 - 5.0 g/dL 3.3   3.6   3.9    AST 15 - 41 U/L $Remo'25   25   23    'iaOOG$ ALT 0 - 44 U/L $Remo'20   21   21    'xHwUk$ Alk Phosphatase 38 - 126 U/L 42   47   51    Total Bilirubin 0.3 - 1.2 mg/dL 0.8   0.8   0.8        Latest Ref Rng & Units 07/12/2019   11:41 AM 04/19/2018    6:21 AM 04/18/2018    8:47 AM  CBC  WBC 3.4 - 10.8 x10E3/uL 7.2   6.6   8.2    Hemoglobin 11.1 - 15.9 g/dL 12.7   11.6   12.1    Hematocrit 34.0 - 46.6 % 38.2   35.4   36.7    Platelets 150 - 450 x10E3/uL 190   164   184     Lab Results  Component Value Date   TSH 5.800 (H) 07/12/2019     Lipid Panel     Component Value Date/Time   CHOL 128 04/18/2018  0458   TRIG 67 04/18/2018 0458   HDL 67 04/18/2018 0458   CHOLHDL 1.9 04/18/2018 0458   VLDL 13 04/18/2018 0458   LDLCALC 48 04/18/2018 0458     RADIOLOGY: Ct Head (brain) Wo Contrast  01/28/2013   CLINICAL DATA:  Right upper extremity weakness and numbness  EXAM: CT HEAD WITHOUT CONTRAST  TECHNIQUE: Contiguous axial images were obtained from the base of the skull through the vertex without intravenous contrast. Study was obtained within 24 hr of patient's arrival at the emergency department.  COMPARISON:  Brain MRI April 27, 2009 and brain CT April 27, 2009  FINDINGS: There is mild diffuse atrophy. There is no mass, hemorrhage, extra-axial fluid collection, or midline shift. There is extensive small vessel disease throughout the centra semiovale bilaterally, a stable finding. There is no new gray-white compartment lesion. There is no demonstrable acute infarct. Bony calvarium appears intact. The mastoid air cells are clear.  IMPRESSION: Mild atrophy with extensive supratentorial small vessel disease. No intracranial mass, hemorrhage, or acute appearing infarct.   Electronically Signed   By: Lowella Grip M.D.   On: 01/28/2013 14:42   Mr Brain Wo Contrast  01/28/2013   CLINICAL DATA:  Numbness in the arms. Dry hacking cough. History of hypertension and dyslipidemia.  EXAM: MRI HEAD WITHOUT CONTRAST  MRA HEAD WITHOUT CONTRAST  TECHNIQUE: Multiplanar, multiecho pulse sequences of the brain and surrounding structures were obtained without intravenous contrast. Angiographic images of the head were obtained using  MRA technique without contrast.  COMPARISON:  CT 01/28/2013.  FINDINGS: MRI HEAD FINDINGS  The patient was unable to remain motionless for the exam. Small or subtle lesions could be overlooked.  No evidence for acute infarction, hemorrhage, mass lesion, hydrocephalus, or extra-axial fluid. Moderate age-related atrophy. Extensive chronic microvascular ischemic change. Basal ganglia  mineralization but no foci of chronic hemorrhage. Flow voids are maintained. No osseous findings. No remote large vessel infarct. Bilateral cataract extraction. No acute sinus or mastoid fluid. Good general agreement with prior CT.  MRA HEAD FINDINGS  Grossly patent internal carotid arteries, and basilar artery. Vertebrals are codominant. No flow-limiting intracranial stenosis, branch occlusion, or aneurysm is evident on this motion degraded exam.  IMPRESSION: MRI HEAD IMPRESSION  No acute intracranial findings are evident. There is advanced atrophy with chronic microvascular ischemic change.  MRA HEAD IMPRESSION  No intracranial flow reducing lesion is evident.   Electronically Signed   By: Rolla Flatten M.D.   On: 01/28/2013 18:47   Mr Jodene Nam Head/brain Wo Cm  01/28/2013   CLINICAL DATA:  Numbness in the arms. Dry hacking cough. History of hypertension and dyslipidemia.  EXAM: MRI HEAD WITHOUT CONTRAST  MRA HEAD WITHOUT CONTRAST  TECHNIQUE: Multiplanar, multiecho pulse sequences of the brain and surrounding structures were obtained without intravenous contrast. Angiographic images of the head were obtained using MRA technique without contrast.  COMPARISON:  CT 01/28/2013.  FINDINGS: MRI HEAD FINDINGS  The patient was unable to remain motionless for the exam. Small or subtle lesions could be overlooked.  No evidence for acute infarction, hemorrhage, mass lesion, hydrocephalus, or extra-axial fluid. Moderate age-related atrophy. Extensive chronic microvascular ischemic change. Basal ganglia mineralization but no foci of chronic hemorrhage. Flow voids are maintained. No osseous findings. No remote large vessel infarct. Bilateral cataract extraction. No acute sinus or mastoid fluid. Good general agreement with prior CT.  MRA HEAD FINDINGS  Grossly patent internal carotid arteries, and basilar artery. Vertebrals are codominant. No flow-limiting intracranial stenosis, branch occlusion, or aneurysm is evident on this  motion degraded exam.  IMPRESSION: MRI HEAD IMPRESSION  No acute intracranial findings are evident. There is advanced atrophy with chronic microvascular ischemic change.  MRA HEAD IMPRESSION  No intracranial flow reducing lesion is evident.   Electronically Signed   By: Rolla Flatten M.D.   On: 01/28/2013 18:47   IMPRESSION:  1. Essential hypertension   2. Hypothyroidism, unspecified type   3. History of TIA (transient ischemic attack)   4. Restless leg syndrome   5. Leg swelling      ASSESSMENT AND PLAN: Ms. Dial is a 88 year old Caucasian female who has a history of prior TIA's and remotely had been  diagnosed as having a TIA with old asymptomatic lacunar infarct. On CT, MRI and MRA studies significant small vessel disease of her brain had been demonstrated.  She has normal systolic function and evidence for aortic sclerosis on echo assessment. She has been Plavix with her TIA history.  At a previous evaluation, she had developed significant sinus bradycardia and ultimately her low-dose Toprol-XL 12.5 mg was weaned and DC'd.  At follow-up evaluation resting pulse was in the 60s.  An echo Doppler study in May 2021 showed EF at 60 to 65%, mild concentric LVH with grade 1 diastolic dysfunction.  Estimated RV systolic pressure was minimally increased at 35 mm.  There was mild aortic sclerosis without stenosis.  She has a history of increasing leg swelling which had improved with HCTZ.  On subsequent evaluations, due to low blood pressure her medical regimen has been reduced.  Her blood pressure today is stable and on repeat by me 122/64 on her regimen of HCTZ 12.5 mg alternating every other day with 25 mg and her reduced dose of olmesartan at 20 mg.  She continues to be on Requa for restless legs.  She continues to be on Plavix following her TIA.  She has a history of hypothyroidism on levothyroxine 25 mcg.  She continues to be on low-dose statin therapy and continues to tolerate atorvastatin 10 mg.  She  will be following up with Dr. Deland Pretty who will be rechecking laboratory.  She has been taking multiple naps at times during the day and staying up late going to bed at midnight.  I have suggested improved sleep hygiene if possible with going to bed earlier.  At times she experiences trace ankle swelling which typically is improved with her hydrochlorothiazide.  I will see her in 1 year for reevaluation or sooner as needed.    Troy Sine, MD, [facility]  09/01/2021 9:34 AM

## 2021-08-25 NOTE — Patient Instructions (Signed)
Medication Instructions:  The current medical regimen is effective;  continue present plan and medications as directed. Please refer to the Current Medication list given to you today.   *If you need a refill on your cardiac medications before your next appointment, please call your pharmacy*  Lab Work:   Testing/Procedures:  NONE    NONE If you have labs (blood work) drawn today and your tests are completely normal, you will receive your results only by: [place] (if you have MyChart) OR  A paper copy in the mail If you have any lab test that is abnormal or we need to change your treatment, we will call you to review the results.  Follow-Up: Your next appointment:  12 month(s) In Person with Shelva Majestic, MD     Please call our office 2 months in advance to schedule this appointment   At [facility], you and your health needs are our priority.  As part of our continuing mission to provide you with exceptional heart care, we have created designated Provider Care Teams.  These Care Teams include your primary Cardiologist (physician) and Advanced Practice Providers (APPs -  Physician Assistants and Nurse Practitioners) who all work together to provide you with the care you need, when you need it.  We recommend signing up for the patient portal called "MyChart".  Sign up information is provided on this After Visit Summary.  MyChart is used to connect with patients for Virtual Visits (Telemedicine).  Patients are able to view lab/test results, encounter notes, upcoming appointments, etc.  Non-urgent messages can be sent to your provider as well.   To learn more about what you can do with MyChart, go to [email or web address].      Important Information About [place]

## 2021-09-01 ENCOUNTER — Encounter: Payer: Self-pay | Admitting: Cardiovascular Disease

## 2021-09-20 DIAGNOSIS — R0981 Nasal congestion: Secondary | ICD-10-CM | POA: Diagnosis not present

## 2021-09-20 DIAGNOSIS — R0789 Other chest pain: Secondary | ICD-10-CM | POA: Diagnosis not present

## 2021-09-20 DIAGNOSIS — R051 Acute cough: Secondary | ICD-10-CM | POA: Diagnosis not present

## 2021-10-17 ENCOUNTER — Other Ambulatory Visit: Payer: Self-pay | Admitting: Cardiovascular Disease

## 2021-11-15 ENCOUNTER — Emergency Department (HOSPITAL_COMMUNITY): Payer: Medicare Other

## 2021-11-15 ENCOUNTER — Emergency Department (HOSPITAL_COMMUNITY)
Admission: EM | Admit: 2021-11-15 | Discharge: 2021-11-15 | Disposition: A | Discharge status: Skilled Nursing Facility | Payer: Medicare Other | Attending: Emergency Medicine | Admitting: Emergency Medicine

## 2021-11-15 ENCOUNTER — Encounter (HOSPITAL_COMMUNITY): Payer: Self-pay

## 2021-11-15 DIAGNOSIS — R519 Headache, unspecified: Secondary | ICD-10-CM | POA: Diagnosis not present

## 2021-11-15 DIAGNOSIS — R404 Transient alteration of awareness: Secondary | ICD-10-CM | POA: Diagnosis not present

## 2021-11-15 DIAGNOSIS — Z743 Need for continuous supervision: Secondary | ICD-10-CM | POA: Diagnosis not present

## 2021-11-15 DIAGNOSIS — M4312 Spondylolisthesis, cervical region: Secondary | ICD-10-CM | POA: Diagnosis not present

## 2021-11-15 DIAGNOSIS — S0101XA Laceration without foreign body of scalp, initial encounter: Secondary | ICD-10-CM | POA: Diagnosis not present

## 2021-11-15 DIAGNOSIS — Z043 Encounter for examination and observation following other accident: Secondary | ICD-10-CM | POA: Diagnosis not present

## 2021-11-15 DIAGNOSIS — M79644 Pain in right finger(s): Secondary | ICD-10-CM | POA: Insufficient documentation

## 2021-11-15 DIAGNOSIS — I10 Essential (primary) hypertension: Secondary | ICD-10-CM | POA: Diagnosis not present

## 2021-11-15 DIAGNOSIS — M25551 Pain in right hip: Secondary | ICD-10-CM | POA: Diagnosis not present

## 2021-11-15 DIAGNOSIS — S299XXA Unspecified injury of thorax, initial encounter: Secondary | ICD-10-CM | POA: Diagnosis not present

## 2021-11-15 DIAGNOSIS — Z7902 Long term (current) use of antithrombotics/antiplatelets: Secondary | ICD-10-CM | POA: Insufficient documentation

## 2021-11-15 DIAGNOSIS — W19XXXA Unspecified fall, initial encounter: Secondary | ICD-10-CM | POA: Insufficient documentation

## 2021-11-15 DIAGNOSIS — T148XXA Other injury of unspecified body region, initial encounter: Secondary | ICD-10-CM | POA: Diagnosis not present

## 2021-11-15 DIAGNOSIS — I700 Atherosclerosis of aorta: Secondary | ICD-10-CM | POA: Diagnosis not present

## 2021-11-15 DIAGNOSIS — S0990XA Unspecified injury of head, initial encounter: Secondary | ICD-10-CM | POA: Diagnosis not present

## 2021-11-15 LAB — BASIC METABOLIC PANEL
Anion gap: 9 (ref 5–15)
BUN: 23 mg/dL (ref 8–23)
CO2: 26 mmol/L (ref 22–32)
Calcium: 9.9 mg/dL (ref 8.9–10.3)
Chloride: 104 mmol/L (ref 98–111)
Creatinine, Ser: 1.03 mg/dL — ABNORMAL HIGH (ref 0.44–1.00)
GFR, Estimated: 50 mL/min — ABNORMAL LOW (ref >60)
Glucose, Bld: 124 mg/dL — ABNORMAL HIGH (ref 70–99)
Potassium: 3.6 mmol/L (ref 3.5–5.1)
Sodium: 139 mmol/L (ref 135–145)

## 2021-11-15 LAB — CBC WITH DIFFERENTIAL/PLATELET
Abs Immature Granulocytes: 0.06 10*3/uL (ref 0.00–0.07)
Basophils Absolute: 0 10*3/uL (ref 0.0–0.1)
Basophils Relative: 1 %
Eosinophils Absolute: 0.1 10*3/uL (ref 0.0–0.5)
Eosinophils Relative: 1 %
HCT: 36.6 % (ref 36.0–46.0)
Hemoglobin: 12.1 g/dL (ref 12.0–15.0)
Immature Granulocytes: 1 %
Lymphocytes Relative: 22 %
Lymphs Abs: 1.3 10*3/uL (ref 0.7–4.0)
MCH: 30.7 pg (ref 26.0–34.0)
MCHC: 33.1 g/dL (ref 30.0–36.0)
MCV: 92.9 fL (ref 80.0–100.0)
Monocytes Absolute: 0.5 10*3/uL (ref 0.1–1.0)
Monocytes Relative: 8 %
Neutro Abs: 4 10*3/uL (ref 1.7–7.7)
Neutrophils Relative %: 67 %
Platelets: 150 10*3/uL (ref 150–400)
RBC: 3.94 MIL/uL (ref 3.87–5.11)
RDW: 14.3 % (ref 11.5–15.5)
WBC: 5.9 10*3/uL (ref 4.0–10.5)
nRBC: 0 % (ref 0.0–0.2)

## 2021-11-15 LAB — I-STAT CHEM 8, ED
BUN: 23 mg/dL (ref 8–23)
Calcium, Ion: 1.22 mmol/L (ref 1.15–1.40)
Chloride: 102 mmol/L (ref 98–111)
Creatinine, Ser: 1 mg/dL (ref 0.44–1.00)
Glucose, Bld: 121 mg/dL — ABNORMAL HIGH (ref 70–99)
HCT: 36 % (ref 36.0–46.0)
Hemoglobin: 12.2 g/dL (ref 12.0–15.0)
Potassium: 3.6 mmol/L (ref 3.5–5.1)
Sodium: 140 mmol/L (ref 135–145)
TCO2: 26 mmol/L (ref 22–32)

## 2021-11-15 LAB — TROPONIN I (HIGH SENSITIVITY): Troponin I (High Sensitivity): 13 ng/L (ref <18)

## 2021-11-15 MED ORDER — LIDOCAINE-EPINEPHRINE (PF) 2 %-1:200000 IJ SOLN
20.0000 mL | Freq: Once | INTRAMUSCULAR | Status: DC
  Filled 2021-11-15: qty 20

## 2021-11-15 NOTE — ED Provider Notes (Signed)
88 year old female signed out to me by Dr. Kingsley Callander pending results imaging and repair of laceration.  Patient able to ambulate at this time.  Will discharge   Lacretia Leigh, MD 11/15/21 [phone]

## 2021-11-15 NOTE — ED Provider Notes (Signed)
[facility] EMERGENCY DEPARTMENT Provider Note   CSN: [id number] Arrival date & time: 11/15/21  [id number]     History  No chief complaint on file.   Valerie Barker is a 88 y.o. female.  Patient presents from facility with unwitnessed fall.  EMS reports she fell from standing position falling backwards striking her posterior head laceration.  Does take Plavix.  Patient does not recall falling.  States she remembers a staff member checking on her about 1 hour previously "to see if I was on the ground" but everything was fine.  She does not recall getting out of bed but believes she fell near the kitchen.  Struck the back of her head on an unknown object.  Bleeding is controlled.  Does have some pain to her right hip and right thumb as well.  No neck or back pain.  No chest pain or shortness of breath.  No abdominal pain.  No dizziness or lightheadedness.  Denies any preceding dizziness or room spinning.  The history is provided by the patient and the EMS personnel.       Home Medications Prior to Admission medications   Medication Sig Start Date End Date Taking? Authorizing Provider  atorvastatin (LIPITOR) 10 MG tablet TAKE 1/2 TABLET EVERY DAY. 12/15/20   Troy Sine, MD  Biotin 10000 MCG TABS Take 10,000 mcg by mouth every evening.     [provider]  Calcium Carbonate-Vitamin D (CALCIUM-D PO) Take 1 tablet by mouth every evening. 1200/500    [provider]  Cholecalciferol (VITAMIN D) 2000 UNITS CAPS Take 2,000 Units by mouth every morning.     [provider]  clopidogrel (PLAVIX) 75 MG tablet Take 75 mg by mouth daily.    [provider]  Cyanocobalamin (VITAMIN B-12 PO) Take 2,500 mcg by mouth every morning.     [provider]  hydrochlorothiazide (HYDRODIURIL) 12.5 MG tablet TAKE 1 TO 2 TABLETS EVERY DAY 10/19/21   Troy Sine, MD  levothyroxine (SYNTHROID) 25 MCG tablet Take 25 mcg by mouth daily before  breakfast.  04/01/19   [provider]  Multiple Vitamin (MULTIVITAMIN) capsule Take 1 capsule by mouth every morning.    [provider]  olmesartan (BENICAR) 20 MG tablet Take 1 tablet (20 mg total) by mouth daily. 06/24/20   Troy Sine, MD  penicillin v potassium (VEETID) 500 MG tablet Take 3 tablets by mouth daily. 12/05/19   [provider]  rOPINIRole (REQUIP) 1 MG tablet Take 1-2 mg by mouth See admin instructions. Takes 1 at evening, 2 at bedtime 05/26/18   [provider]      Allergies    Duloxetine, Erythromycin, Mirtazapine, Trazodone, Zolpidem, Cyclobenzaprine, and Diazepam    Review of Systems   Review of Systems  Unable to perform ROS: Dementia    Physical Exam Updated Vital Signs BP 116/68   Pulse 96   Temp 98.1 F (36.7 C)   Resp 16   Ht '4\' 11"'$  (1.499 m)   Wt 59 kg   SpO2 95%   BMI 26.26 kg/m  Physical Exam Vitals and nursing note reviewed.  Constitutional:      General: She is not in acute distress.    Appearance: She is well-developed. She is not ill-appearing.  HENT:     Head: Normocephalic.     Comments: 2 cm vertical laceration to occiput, bleeding controlled    Mouth/Throat:     Pharynx: No oropharyngeal exudate.  Eyes:     Conjunctiva/sclera: Conjunctivae normal.     Pupils: Pupils are equal, round, and reactive to light.  Neck:     Comments: C-collar placed on arrival Cardiovascular:     Rate and Rhythm: Normal rate and regular rhythm.     Heart sounds: Normal heart sounds. No murmur heard. Pulmonary:     Effort: Pulmonary effort is normal. No respiratory distress.     Breath sounds: Normal breath sounds.  Chest:     Chest wall: No tenderness.  Abdominal:     Palpations: Abdomen is soft.     Tenderness: There is no abdominal tenderness. There is no guarding or rebound.  Musculoskeletal:        General: No tenderness. Normal range of motion.     Cervical back: Normal range of motion and neck supple.      Comments: No T or L-spine tenderness.  Full range of motion of hips bilaterally without pain  Chronic appearing deformity to right thumb.  Nontender.  Skin:    General: Skin is warm.  Neurological:     General: No focal deficit present.     Mental Status: She is alert and oriented to person, place, and time. Mental status is at baseline.     Cranial Nerves: No cranial nerve deficit.     Motor: No abnormal muscle tone.     Coordination: Coordination normal.     Comments:  5/5 strength throughout. CN 2-12 intact.Equal grip strength.   Psychiatric:        Behavior: Behavior normal.     ED Results / Procedures / Treatments   Labs (all labs ordered are listed, but only abnormal results are displayed) Labs Reviewed  I-STAT CHEM 8, ED - Abnormal; Notable for the following components:      Result Value   Glucose, Bld 121 (*)    All other components within normal limits  CBC WITH DIFFERENTIAL/PLATELET  BASIC METABOLIC PANEL  TROPONIN I (HIGH SENSITIVITY)    EKG EKG Interpretation  Date/Time:  Monday November 15 2021 06:32:25 EDT Ventricular Rate:  85 PR Interval:  151 QRS Duration: 95 QT Interval:  359 QTC Calculation: 427 R Axis:   52 Text Interpretation: Sinus rhythm Atrial premature complexes Posterior infarct, old Borderline T abnormalities, diffuse leads Nonspecific T wave abnormality Confirmed by Ezequiel Essex [phone]) on 11/15/2021 6:39:41 AM  Radiology No results found.  Procedures Procedures    Medications Ordered in ED Medications  lidocaine-EPINEPHrine (XYLOCAINE W/EPI) 2 %-1:200000 (PF) injection 20 mL (has no administration in time range)    ED Course/ Medical Decision Making/ A&P                           Medical Decision Making Amount and/or Complexity of Data Reviewed Labs: ordered. Decision-making details documented in ED Course. Radiology: ordered and independent interpretation performed. Decision-making details documented in ED  Course. ECG/medicine tests: ordered and independent interpretation performed. Decision-making details documented in ED Course.  Risk Prescription drug management.  Fall with head injury on Plavix.  Does not recall circumstances of the fall but denies any preceding dizziness or lightheadedness.  GCS is 15, ABCs are intact.  EKG is sinus rhythm with PACs.  Neurologically intact.  Does have evidence of head laceration or hematoma.  Given her Plavix use will obtain imaging  Chest x-ray and pelvis x-ray ordered given her history of fall with right hip pain.  Also obtain right hand x-ray  given deformity on exam though this is suspected to be chronic.  Care to be transferred at shift change to oncoming team.  Anticipate discharge home if imaging is reassuring, she is able to ambulate and laceration repaired.        Final Clinical Impression(s) / ED Diagnoses Final diagnoses:  Fall, initial encounter  Laceration of scalp, initial encounter    Rx / DC Orders ED Discharge Orders     None         Zeola Brys, Annie Main, MD 11/15/21 [phone]

## 2021-11-15 NOTE — ED Notes (Signed)
Pt to CT scanner at this time with RN [place] with trauma team

## 2021-11-15 NOTE — ED Provider Notes (Signed)
S: Patient feeling better since arrival to the ED.  Complains of chronic pain.  Has some pain over site of laceration.  O: No new exam findings.  Imaging negative for intracranial bleeding and fracture.  A/P: 88 year old lady with laceration to occiput of head from fall.  Will repair with staples.  To return in 7 to 14 days for removal.   Nani Gasser, MD 11/15/21 [place], MD 11/16/21 [phone]

## 2021-11-15 NOTE — ED Notes (Signed)
Radiology at the bedside

## 2021-11-15 NOTE — ED Triage Notes (Addendum)
Pt from [facility] via [facility] d/t fall from standing position, pt fell backwards striking her posterior head, laceration noted, bleeding controlled, pt is on 75 mg Plavix daily. Pt does not remember fall, VSS per EMS, pt alert and answering questions, denies pain at current

## 2021-11-15 NOTE — Discharge Instructions (Addendum)
You were evaluated in the emergency department after a fall.  You are not found to have any bleeding on the brain or skull or neck fracture.  You have no new hip, wrist, or rib fractures.  You had a cut on the back of your head that was repaired with staples.  Please return to have your staples removed in 7 to 14 days.  Please return to the emergency department for new or worsening headache, confusion, or if you fall again.  I recommend following up with your primary care physician outside of the hospital.

## 2021-11-15 NOTE — ED Notes (Signed)
Pt placed in [place] at this time

## 2021-11-24 DIAGNOSIS — Z4802 Encounter for removal of sutures: Secondary | ICD-10-CM | POA: Diagnosis not present

## 2021-11-30 DIAGNOSIS — M810 Age-related osteoporosis without current pathological fracture: Secondary | ICD-10-CM | POA: Diagnosis not present

## 2021-11-30 DIAGNOSIS — D692 Other nonthrombocytopenic purpura: Secondary | ICD-10-CM | POA: Diagnosis not present

## 2021-11-30 DIAGNOSIS — E039 Hypothyroidism, unspecified: Secondary | ICD-10-CM | POA: Diagnosis not present

## 2021-11-30 DIAGNOSIS — I10 Essential (primary) hypertension: Secondary | ICD-10-CM | POA: Diagnosis not present

## 2021-11-30 DIAGNOSIS — E538 Deficiency of other specified B group vitamins: Secondary | ICD-10-CM | POA: Diagnosis not present

## 2021-11-30 DIAGNOSIS — G2581 Restless legs syndrome: Secondary | ICD-10-CM | POA: Diagnosis not present

## 2021-11-30 DIAGNOSIS — R5383 Other fatigue: Secondary | ICD-10-CM | POA: Diagnosis not present

## 2021-11-30 DIAGNOSIS — Z8673 Personal history of transient ischemic attack (TIA), and cerebral infarction without residual deficits: Secondary | ICD-10-CM | POA: Diagnosis not present

## 2021-12-20 ENCOUNTER — Other Ambulatory Visit: Payer: Self-pay | Admitting: Cardiovascular Disease

## 2022-01-21 ENCOUNTER — Other Ambulatory Visit: Payer: Self-pay | Admitting: Cardiovascular Disease

## 2022-01-21 DIAGNOSIS — Z23 Encounter for immunization: Secondary | ICD-10-CM | POA: Diagnosis not present

## 2022-02-28 DIAGNOSIS — M810 Age-related osteoporosis without current pathological fracture: Secondary | ICD-10-CM | POA: Diagnosis not present

## 2022-02-28 DIAGNOSIS — G2581 Restless legs syndrome: Secondary | ICD-10-CM | POA: Diagnosis not present

## 2022-02-28 DIAGNOSIS — Z23 Encounter for immunization: Secondary | ICD-10-CM | POA: Diagnosis not present

## 2022-02-28 DIAGNOSIS — M8589 Other specified disorders of bone density and structure, multiple sites: Secondary | ICD-10-CM | POA: Diagnosis not present

## 2022-02-28 DIAGNOSIS — I10 Essential (primary) hypertension: Secondary | ICD-10-CM | POA: Diagnosis not present

## 2022-02-28 DIAGNOSIS — E039 Hypothyroidism, unspecified: Secondary | ICD-10-CM | POA: Diagnosis not present

## 2022-04-25 NOTE — Progress Notes (Signed)
Cardiology Office Note:    Date:  04/27/2022   ID:  Valerie Barker, DOB 09/15/1922, MRN [id number]  PCP:  Deland Pretty, [place] Providers Cardiologist:  Shelva Majestic, MD {    Referring MD: Deland Pretty, MD   Chief Complaint  Patient presents with   Chest Pain         History of Present Illness:    Valerie Barker is a 89 y.o. female with a hx of HTN, restless leg syndrome, chronic diastolic heart failure, history of TIAs/CVA. She is followed by Dr. Claiborne Billings. Patient underwent echocardiogram on 04/18/2018 that showed EF 60-65%, no regional wall motion abnormalities, grade II diastolic dysfunction. 30 day cardiac monitor in 04/2018 showed predominantly sinus rhythm, no episodes of atrial fibrillation or SVT, no pauses. Most recent echocardiogram from 08/21/2019 showed EF 60-65%, mild LVH, grade I diastolic dysfunction.   Patient was last seen by Dr. Claiborne Billings on 08/25/21. At that time, patient denied chest pain, but did admit to SOB on exertion. Reported having poor short term memory, but her long-term memory was fine. She admitted that she was taking hydrochlorothiazide daily instead of as needed, and she was instructed to take the medication only as needed as she was complaining of feeling dehydrated.   Today, patient reports that she has had a few episodes of chest pain in the past month.  Unable to explain exactly how many.  Does admit that she has issues with her short term memory, so she has a difficult time recalling these episodes. The vast majority of these episodes occur at night and wake her up out of sleep.  Reports feeling a dull achy feeling located in the center of her chest.  Pain does not radiate.  Pain goes away if she chews a baby aspirin.  She has had 1 episode of this pain during the day, during which she was at rest.  She has not noticed any factors that make the pain worse.  She has not tried any other treatments besides aspirin.  She is able to  walk around without having chest pain.  Son notes that her apartment is the furthest from the dining room, and she is able to make the walk without developing chest pain.  Has some shortness of breath on exertion.  No palpitations, dizziness, shortness of breath at rest.   Past Medical History:  Diagnosis Date   Heart disease    Hypertension    IBS (irritable bowel syndrome)    Stroke ([place]) 04/17/2018   around 2011   TIA (transient ischemic attack)     Past Surgical History:  Procedure Laterality Date   [place]  2006, [place]    Current Medications: No outpatient medications have been marked as taking for the 04/27/22 encounter (Office Visit) with Ledora Bottcher, [place].     Allergies:   Duloxetine, Erythromycin, Mirtazapine, Trazodone, Zolpidem, Cyclobenzaprine, and Diazepam   Social History   Socioeconomic History   Marital status: Married    Spouse name: Not on file   Number of children: Not on file   Years of education: Not on file   Highest education level: Not on file  Occupational History   Not on file  Tobacco Use   Smoking status: Never   Smokeless tobacco: Never  Vaping Use   Vaping Use: Never used  Substance and Sexual Activity   Alcohol use: No  Drug use: No   Sexual activity: Not Currently  Other Topics Concern   Not on file  Social History Narrative   Lives in [place].  Education BA degree.  Retired.  3 children.  Son, [place].  Caffeine 1=2 cups daily.   Social Determinants of Health   Financial Resource Strain: Not on file  Food Insecurity: Not on file  Transportation Needs: Not on file  Physical Activity: Not on file  Stress: Not on file  Social Connections: Not on file     Family History: The patient's family history includes COPD in her mother; Schizophrenia in her brother; Stroke in her father. There is no history of Breast  cancer.  ROS:   Please see the history of present illness.     All other systems reviewed and are negative.  EKGs/Labs/Other Studies Reviewed:    The following studies were reviewed today:  Echocardiogram 08/21/19   1. Left ventricular ejection fraction, by estimation, is 60 to 65%. The  left ventricle has normal function. The left ventricle has no regional  wall motion abnormalities. There is mild concentric left ventricular  hypertrophy. Left ventricular diastolic  parameters are consistent with Grade I diastolic dysfunction (impaired  relaxation).   2. Right ventricular systolic function is normal. The right ventricular  size is normal. There is normal pulmonary artery systolic pressure. The  estimated right ventricular systolic pressure is 96.7 mmHg.   3. The mitral valve is grossly normal. Trivial mitral valve  regurgitation. No evidence of mitral stenosis.   4. The aortic valve is tricuspid. Aortic valve regurgitation is not  visualized. Mild aortic valve sclerosis is present, with no evidence of  aortic valve stenosis.   5. The inferior vena cava is normal in size with greater than 50%  respiratory variability, suggesting right atrial pressure of 3 mmHg.   Comparison(s): No significant change from prior study.   Cardiac Monitor 05/03/18 The patient was monitored from January 30 through June 01, 2018. The predominant rhythm was sinus rhythm. The average heart rate was 72 bpm. The minimum heart rate was sinus bradycardia at 54 bpm which occurred at 12:45 AM, most likely while sleeping. The maximal heart rate was sinus tachycardia at 116 bpm. There was one 3 beat atrial run at 120 bpm. There was a very rare isolated unifocal PVC. There were no episodes of atrial fibrillation or SVT. There were no pauses. There were no episodes of ventricular tachycardia. Started to warm up little bit sounds actually pretty nice    EKG:  EKG is ordered today.  The ekg ordered today demonstrates  sinus rhythm with PACs, HR 67 BPM   Recent Labs: 11/15/2021: BUN 23; Creatinine, Ser 1.00; Hemoglobin 12.2; Platelets 150; Potassium 3.6; Sodium 140  Recent Lipid Panel    Component Value Date/Time   CHOL 128 04/18/2018 0458   TRIG 67 04/18/2018 0458   HDL 67 04/18/2018 0458   CHOLHDL 1.9 04/18/2018 0458   VLDL 13 04/18/2018 0458   LDLCALC 48 04/18/2018 0458     Risk Assessment/Calculations:                Physical Exam:    VS:  BP 121/64   Pulse 67   Ht '4\' 11"'$  (1.499 m)   Wt 134 lb 9.6 oz (61.1 kg)   SpO2 95%   BMI 27.19 kg/m     Wt Readings from Last 3 Encounters:  04/27/22 134 lb 9.6 oz (61.1 kg)  11/15/21 130  lb (59 kg)  08/25/21 135 lb 12.8 oz (61.6 kg)     GEN: Elderly female, sitting comfortably in the chair. No acute distress  HEENT: Normal CARDIAC: RRR, no murmurs, rubs, gallops. Radial pulses 2+ bilaterally. Chest wall tender to palpation over the sternum  RESPIRATORY: Occasional expiratory wheezing in bilateral lung bases. No crackles, rhonchi. Normal WOB on room air  ABDOMEN: Soft, non-tender, non-distended MUSCULOSKELETAL:  Trace edema in BLE; No deformity  SKIN: Warm and dry NEUROLOGIC:  Poor short term memory, repeats herself multiple times throughout the conversation  PSYCHIATRIC:  Normal affect   ASSESSMENT:    1. Chest pain, unspecified type   2. SOB (shortness of breath)   3. History of TIA (transient ischemic attack)   4. Chronic diastolic heart failure ([place])   5. PAC (premature atrial contraction)   6. Essential hypertension    PLAN:    In order of problems listed above:  Nonspecific Chest Pain  - Patient complains of substernal chest pain that occurs predominantly at night when she is laying in bed. Patient reports that she has issues with her short term memory, so she is somewhat of a poor historian. However, chest pain is fairly atypical as it only happens when she is at rest, and she adamantly denies having chest pain on exertion.  Chest wall is tender to palpation over the sternum - Patient sleeps flat in her bed. As chest pain almost exclusively occurs at night when she is in bed, recommended trial of protonix. However, patient and son declined as she is concerned about side effects. Recommended using otc Tums when needed  - Ordered echocardiogram. Of note, patient/family are hesitant to consider invasive procedures, including cardiac catheterization. Regardless, echo can help guide medical management   SOB on exertion  Chronic diastolic heart failure  - Most recent echocardiogram from 08/21/19 showed EF 95-09%, grade I diastolic dysfunction  - Patient mentions having sob on exertion. Denies SOB while at rest. Not associated with chest pain. Son reports that patient is fairly sedentary in her day to day life - Ordered echocardiogram as above  - Patient euvolemic on exam. Suspect that SOB is mostly due to deconditioning  - Patient on HCTZ 12.45 mg daily. Tolerating well   History of TIA  - Patient on plavix, lipitor 10 mg daily  - Of note, patient's caregiver had been giving patient daily ASA in addition to plavix. Patient had not been instructed to be on GDMT by any providers. Asked son to make sure patient stops taking daily ASA due to increased bleeding risk   PACs - EKG today showed sinus rhythm with frequent PACs. PACs have been present on EKGs since at least 2015. Patient is asymptomatic  - Patient has been on BB in the past, but it was stopped due to bradycardia    HTN  - BP well controlled on current therapy   Medication Adjustments/Labs and Tests Ordered: Current medicines are reviewed at length with the patient today.  Concerns regarding medicines are outlined above.  Orders Placed This Encounter  Procedures   EKG 12-Lead   ECHOCARDIOGRAM COMPLETE   No orders of the defined types were placed in this encounter.   Patient Instructions  Medication Instructions:  Your physician recommends that you  continue on your current medications as directed. Please refer to the Current Medication list given to you today.  *If you need a refill on your cardiac medications before your next appointment, please call your pharmacy*  Lab Work: NONE If you have labs (blood work) drawn today and your tests are completely normal, you will receive your results only by: [place] (if you have MyChart) OR A paper copy in the mail If you have any lab test that is abnormal or we need to change your treatment, we will call you to review the results.   Testing/Procedures: Your physician has requested that you have an echocardiogram. Echocardiography is a painless test that uses sound waves to create images of your heart. It provides your doctor with information about the size and shape of your heart and how well your heart's chambers and valves are working. This procedure takes approximately one hour. There are no restrictions for this procedure. Please do NOT wear cologne, perfume, aftershave, or lotions (deodorant is allowed). Please arrive 15 minutes prior to your appointment time.    Follow-Up: At [facility], you and your health needs are our priority.  As part of our continuing mission to provide you with exceptional heart care, we have created designated Provider Care Teams.  These Care Teams include your primary Cardiologist (physician) and Advanced Practice Providers (APPs -  Physician Assistants and Nurse Practitioners) who all work together to provide you with the care you need, when you need it.  We recommend signing up for the patient portal called "MyChart".  Sign up information is provided on this After Visit Summary.  MyChart is used to connect with patients for Virtual Visits (Telemedicine).  Patients are able to view lab/test results, encounter notes, upcoming appointments, etc.  Non-urgent messages can be sent to your provider as well.   To learn more about what you can do with  MyChart, go to [email or web address].    Your next appointment:   4-6 week(s)  Provider:   Fabian Sharp, PA-C        Signed, Margie Billet, PA-C  04/27/2022 1:44 PM    [place]

## 2022-04-27 ENCOUNTER — Encounter: Payer: Self-pay | Admitting: Physician Assistant

## 2022-04-27 ENCOUNTER — Ambulatory Visit: Payer: Medicare Other | Attending: Physician Assistant | Admitting: Physician Assistant

## 2022-04-27 VITALS — BP 121/64 | HR 67 | Ht 59.0 in | Wt 134.6 lb

## 2022-04-27 DIAGNOSIS — I10 Essential (primary) hypertension: Secondary | ICD-10-CM | POA: Diagnosis not present

## 2022-04-27 DIAGNOSIS — Z8673 Personal history of transient ischemic attack (TIA), and cerebral infarction without residual deficits: Secondary | ICD-10-CM

## 2022-04-27 DIAGNOSIS — I5032 Chronic diastolic (congestive) heart failure: Secondary | ICD-10-CM

## 2022-04-27 DIAGNOSIS — R079 Chest pain, unspecified: Secondary | ICD-10-CM | POA: Diagnosis not present

## 2022-04-27 DIAGNOSIS — I491 Atrial premature depolarization: Secondary | ICD-10-CM | POA: Diagnosis not present

## 2022-04-27 DIAGNOSIS — R0602 Shortness of breath: Secondary | ICD-10-CM | POA: Diagnosis not present

## 2022-04-27 NOTE — Patient Instructions (Addendum)
Medication Instructions:  Your physician recommends that you continue on your current medications as directed. Please refer to the Current Medication list given to you today.  *If you need a refill on your cardiac medications before your next appointment, please call your pharmacy*   Lab Work: NONE If you have labs (blood work) drawn today and your tests are completely normal, you will receive your results only by: [place] (if you have MyChart) OR A paper copy in the mail If you have any lab test that is abnormal or we need to change your treatment, we will call you to review the results.   Testing/Procedures: Your physician has requested that you have an echocardiogram. Echocardiography is a painless test that uses sound waves to create images of your heart. It provides your doctor with information about the size and shape of your heart and how well your heart's chambers and valves are working. This procedure takes approximately one hour. There are no restrictions for this procedure. Please do NOT wear cologne, perfume, aftershave, or lotions (deodorant is allowed). Please arrive 15 minutes prior to your appointment time.    Follow-Up: At [facility], you and your health needs are our priority.  As part of our continuing mission to provide you with exceptional heart care, we have created designated Provider Care Teams.  These Care Teams include your primary Cardiologist (physician) and Advanced Practice Providers (APPs -  Physician Assistants and Nurse Practitioners) who all work together to provide you with the care you need, when you need it.  We recommend signing up for the patient portal called "MyChart".  Sign up information is provided on this After Visit Summary.  MyChart is used to connect with patients for Virtual Visits (Telemedicine).  Patients are able to view lab/test results, encounter notes, upcoming appointments, etc.  Non-urgent messages can be sent to your  provider as well.   To learn more about what you can do with MyChart, go to [email or web address].    Your next appointment:   4-6 week(s)  Provider:   Fabian Sharp, PA-C

## 2022-05-03 DIAGNOSIS — R300 Dysuria: Secondary | ICD-10-CM | POA: Diagnosis not present

## 2022-05-17 ENCOUNTER — Ambulatory Visit (HOSPITAL_COMMUNITY): Payer: Medicare Other | Attending: Cardiovascular Disease

## 2022-05-17 DIAGNOSIS — R0602 Shortness of breath: Secondary | ICD-10-CM | POA: Diagnosis not present

## 2022-05-17 LAB — ECHOCARDIOGRAM COMPLETE
Area-P 1/2: 3.21 cm2
S' Lateral: 2.2 cm

## 2022-05-20 DIAGNOSIS — R300 Dysuria: Secondary | ICD-10-CM | POA: Diagnosis not present

## 2022-05-26 NOTE — Progress Notes (Unsigned)
Cardiology Office Note:    Date:  06/02/2022   ID:  Valerie Barker, DOB [birth year], MRN [id number]  PCP:  Deland Pretty, [place] Providers Cardiologist:  Shelva Majestic, MD     Referring MD: Deland Pretty, MD   Chief Complaint  Patient presents with   Follow-up    DOE    History of Present Illness:    Valerie Barker is a 89 y.o. female with a hx of Valerie Barker is a 89 y.o. female with a hx of valvular heart disease, history of TIA/lacunar infarcts, hypertension, hyperlipidemia, frequent PACs, and bradycardia leading to discontinuation of beta-blocker. Nuclear stress test in 2012 was nonischemic.  Echocardiogram with mild to moderate TR, trace MR, mild aortic sclerosis and normal systolic function.  She had a TIA 01/2013.  MRI did not show acute intracranial abnormality but did show chronic microvascular ischemic changes.  Echocardiogram 04/2018 showed grade 2 DD, normal EF.  Beta-blocker therapy was discontinued secondary to bradycardia.  She has been on HCTZ 25 mg in the past for lower extremity swelling. She was last seen by Dr. Claiborne Billings 08/25/2021.  She remains on 10 mg Lipitor, 75 mg Plavix, 12.5-25 mg HCTZ as needed, and 20 mg olmesartan.  I saw her in follow-up on 04/27/2022 and she complained of chest pain that would wake her from sleep while laying in bed.  She adamantly denied exertional chest pain during the day.  I felt the chest pain was atypical and offered her a trial of PPI, but she declined stating she did not want to deal with the side effects.  Of note, she is not interested in invasive measures and I think this is a good decision.  I did opt to order a repeat an echocardiogram for new DOE.   I advised stopping ASA and continuation of plavix monotherapy for hx of TIA.   Echo 05/17/22 with hyperdynamic LVEF 70-75% and grade 2 DD, severe LAE, moderate TR, moderate pulmonic valve regurgitation.   She returns today for follow-up. She is  here with her son.  She is doing well and without pain. She has not had falls. She states her DOE is not troubling her. She does mention that she has chest pain from time to time and treats this with a chewable ASA. She reports taking 4 in the month of February. CP always occurs with rest.     Past Medical History:  Diagnosis Date   Heart disease    Hypertension    IBS (irritable bowel syndrome)    Stroke ([place]) 04/17/2018   around 2011   TIA (transient ischemic attack)     Past Surgical History:  Procedure Laterality Date   ABDOMINAL HYSTERECTOMY  1978   BUNIONECTOMY  2006, 1996   LAPAROSCOPIC SALPINGO OOPHERECTOMY  1988    Current Medications: Current Meds  Medication Sig   atorvastatin (LIPITOR) 10 MG tablet TAKE 1/2 TABLET EVERY EVENING   Biotin 10000 MCG TABS Take 10,000 mcg by mouth every evening.    Calcium Carbonate-Vitamin D (CALCIUM-D PO) Take 1 tablet by mouth every evening. 1200/500   Cholecalciferol (VITAMIN D) 2000 UNITS CAPS Take 2,000 Units by mouth every morning.    clopidogrel (PLAVIX) 75 MG tablet Take 75 mg by mouth daily.   Cyanocobalamin (VITAMIN B-12 PO) Take 2,500 mcg by mouth every morning.    hydrochlorothiazide (HYDRODIURIL) 12.5 MG tablet TAKE 1 OR 2 TABLETS BY MOUTH EVERY DAY   levothyroxine (SYNTHROID) 25 MCG  tablet Take 25 mcg by mouth daily before breakfast.    Multiple Vitamin (MULTIVITAMIN) capsule Take 1 capsule by mouth every morning.   olmesartan (BENICAR) 20 MG tablet Take 1 tablet (20 mg total) by mouth daily.   penicillin v potassium (VEETID) 500 MG tablet Take 3 tablets by mouth daily.   rOPINIRole (REQUIP) 1 MG tablet Take 1-2 mg by mouth See admin instructions. Takes 1 at evening, 2 at bedtime     Allergies:   Duloxetine, Erythromycin, Mirtazapine, Trazodone, Zolpidem, Cyclobenzaprine, and Diazepam   Social History   Socioeconomic History   Marital status: Married    Spouse name: Not on file   Number of children: Not on file    Years of education: Not on file   Highest education level: Not on file  Occupational History   Not on file  Tobacco Use   Smoking status: Never   Smokeless tobacco: Never  Vaping Use   Vaping Use: Never used  Substance and Sexual Activity   Alcohol use: No   Drug use: No   Sexual activity: Not Currently  Other Topics Concern   Not on file  Social History Narrative   Lives in [place] at [place].  Education BA degree.  Retired.  3 children.  Son, [place].  Caffeine 1=2 cups daily.   Social Determinants of Health   Financial Resource Strain: Not on file  Food Insecurity: Not on file  Transportation Needs: Not on file  Physical Activity: Not on file  Stress: Not on file  Social Connections: Not on file     Family History: The patient's family history includes COPD in her mother; Schizophrenia in her brother; Stroke in her father. There is no history of Breast cancer.  ROS:   Please see the history of present illness.     All other systems reviewed and are negative.  EKGs/Labs/Other Studies Reviewed:    The following studies were reviewed today:  Echo 05/17/22: 1. Left ventricular ejection fraction, by estimation, is 70 to 75%. The  left ventricle has hyperdynamic function. The left ventricle has no  regional wall motion abnormalities. Left ventricular diastolic parameters  are consistent with Grade II diastolic  dysfunction (pseudonormalization). Elevated left ventricular end-diastolic  pressure.   2. Right ventricular systolic function is normal. The right ventricular  size is normal. There is moderately elevated pulmonary artery systolic  pressure.   3. Left atrial size was severely dilated.   4. Right atrial size was mildly dilated.   5. The mitral valve is normal in structure. Mild mitral valve  regurgitation. No evidence of mitral stenosis.   6. Tricuspid valve regurgitation is moderate.   7. The aortic valve is tricuspid. Aortic valve regurgitation  is not  visualized. No aortic stenosis is present.   8. Pulmonic valve regurgitation is moderate.   9. The inferior vena cava is normal in size with greater than 50%  respiratory variability, suggesting right atrial pressure of 3 mmHg.   EKG:  EKG is not ordered today.    Recent Labs: 11/15/2021: BUN 23; Creatinine, Ser 1.00; Hemoglobin 12.2; Platelets 150; Potassium 3.6; Sodium 140  Recent Lipid Panel    Component Value Date/Time   CHOL 128 04/18/2018 0458   TRIG 67 04/18/2018 0458   HDL 67 04/18/2018 0458   CHOLHDL 1.9 04/18/2018 0458   VLDL 13 04/18/2018 0458   LDLCALC 48 04/18/2018 0458     Risk Assessment/Calculations:  Physical Exam:    VS:  BP 122/76   Pulse 61   Ht '4\' 11"'$  (1.499 m)   Wt 130 lb 12.8 oz (59.3 kg)   SpO2 98%   BMI 26.42 kg/m     Wt Readings from Last 3 Encounters:  06/02/22 130 lb 12.8 oz (59.3 kg)  04/27/22 134 lb 9.6 oz (61.1 kg)  11/15/21 130 lb (59 kg)     GEN:  Well nourished, well developed in no acute distress HEENT: Normal NECK: No JVD; No carotid bruits LYMPHATICS: No lymphadenopathy CARDIAC: RRR, no murmurs, rubs, gallops RESPIRATORY:  Clear to auscultation without rales, wheezing or rhonchi  ABDOMEN: Soft, non-tender, non-distended MUSCULOSKELETAL:  No edema; No deformity  SKIN: Warm and dry NEUROLOGIC:  Alert and oriented x 3 PSYCHIATRIC:  Normal affect   ASSESSMENT:    1. Chest pain, unspecified type   2. Dyspnea on exertion   3. Primary hypertension   4. TIA (transient ischemic attack)   5. Sinus bradycardia   6. PAC (premature atrial contraction)    PLAN:    In order of problems listed above:  Atypical chest pain She does have some short-term memory issues and does not recall some elements of her chest pain. She does not want to try PPI, does not want invasive measures She is taking intermittent chewable ASA. I just caution frequent use with her plavix and risk of GI bleed. I will not start an  anti-anginal - do not want to drop BP and risk falls   Dyspnea on exertion Chronic diastolic heart failure History of TR/MR Newly recognized pulmonic valve regurgitation Has been on HCTZ as needed Echocardiogram from 08/2019 with a preserved EF and grade 1 diastolic dysfunction She does mention shortness of breath with exertion not associated with chest pain.   Hypertension Benicar, HCTZ   TIA/CVA Plavix, statin 10 mg lipitor   Frequent PACs Bradycardia Beta-blocker weaned off Asymptomatic   Follow up in 6 months with Dr. Claiborne Billings.   Medication Adjustments/Labs and Tests Ordered: Current medicines are reviewed at length with the patient today.  Concerns regarding medicines are outlined above.  No orders of the defined types were placed in this encounter.  No orders of the defined types were placed in this encounter.   Patient Instructions  Medication Instructions:  No Changes *If you need a refill on your cardiac medications before your next appointment, please call your pharmacy*   Lab Work: No Labs If you have labs (blood work) drawn today and your tests are completely normal, you will receive your results only by: [place] (if you have MyChart) OR A paper copy in the mail If you have any lab test that is abnormal or we need to change your treatment, we will call you to review the results.   Testing/Procedures: No Testing   Follow-Up: At [facility], you and your health needs are our priority.  As part of our continuing mission to provide you with exceptional heart care, we have created designated Provider Care Teams.  These Care Teams include your primary Cardiologist (physician) and Advanced Practice Providers (APPs -  Physician Assistants and Nurse Practitioners) who all work together to provide you with the care you need, when you need it.  We recommend signing up for the patient portal called "MyChart".  Sign up information is provided on  this After Visit Summary.  MyChart is used to connect with patients for Virtual Visits (Telemedicine).  Patients are able to view lab/test  results, encounter notes, upcoming appointments, etc.  Non-urgent messages can be sent to your provider as well.   To learn more about what you can do with MyChart, go to [email or web address].    Your next appointment:   5-6 month(s)  Provider:   Shelva Majestic, MD     Signed, Tami Lin Matteson, [place]  06/02/2022 3:59 PM    [place]

## 2022-06-01 DIAGNOSIS — M542 Cervicalgia: Secondary | ICD-10-CM | POA: Diagnosis not present

## 2022-06-01 DIAGNOSIS — R293 Abnormal posture: Secondary | ICD-10-CM | POA: Diagnosis not present

## 2022-06-02 ENCOUNTER — Encounter: Payer: Self-pay | Admitting: Physician Assistant

## 2022-06-02 ENCOUNTER — Ambulatory Visit: Payer: Medicare Other | Attending: Physician Assistant | Admitting: Physician Assistant

## 2022-06-02 VITALS — BP 122/76 | HR 61 | Ht 59.0 in | Wt 130.8 lb

## 2022-06-02 DIAGNOSIS — I10 Essential (primary) hypertension: Secondary | ICD-10-CM | POA: Diagnosis not present

## 2022-06-02 DIAGNOSIS — R001 Bradycardia, unspecified: Secondary | ICD-10-CM

## 2022-06-02 DIAGNOSIS — I491 Atrial premature depolarization: Secondary | ICD-10-CM | POA: Diagnosis not present

## 2022-06-02 DIAGNOSIS — G459 Transient cerebral ischemic attack, unspecified: Secondary | ICD-10-CM | POA: Diagnosis not present

## 2022-06-02 DIAGNOSIS — R0609 Other forms of dyspnea: Secondary | ICD-10-CM

## 2022-06-02 DIAGNOSIS — R079 Chest pain, unspecified: Secondary | ICD-10-CM | POA: Diagnosis not present

## 2022-06-02 NOTE — Patient Instructions (Signed)
Medication Instructions:  No Changes *If you need a refill on your cardiac medications before your next appointment, please call your pharmacy*   Lab Work: No Labs If you have labs (blood work) drawn today and your tests are completely normal, you will receive your results only by: [place] (if you have MyChart) OR A paper copy in the mail If you have any lab test that is abnormal or we need to change your treatment, we will call you to review the results.   Testing/Procedures: No Testing   Follow-Up: At [facility], you and your health needs are our priority.  As part of our continuing mission to provide you with exceptional heart care, we have created designated Provider Care Teams.  These Care Teams include your primary Cardiologist (physician) and Advanced Practice Providers (APPs -  Physician Assistants and Nurse Practitioners) who all work together to provide you with the care you need, when you need it.  We recommend signing up for the patient portal called "MyChart".  Sign up information is provided on this After Visit Summary.  MyChart is used to connect with patients for Virtual Visits (Telemedicine).  Patients are able to view lab/test results, encounter notes, upcoming appointments, etc.  Non-urgent messages can be sent to your provider as well.   To learn more about what you can do with MyChart, go to [email or web address].    Your next appointment:   5-6 month(s)  Provider:   Shelva Majestic, MD

## 2022-06-06 DIAGNOSIS — R293 Abnormal posture: Secondary | ICD-10-CM | POA: Diagnosis not present

## 2022-06-06 DIAGNOSIS — M542 Cervicalgia: Secondary | ICD-10-CM | POA: Diagnosis not present

## 2022-06-08 DIAGNOSIS — R293 Abnormal posture: Secondary | ICD-10-CM | POA: Diagnosis not present

## 2022-06-08 DIAGNOSIS — M542 Cervicalgia: Secondary | ICD-10-CM | POA: Diagnosis not present

## 2022-06-15 DIAGNOSIS — M542 Cervicalgia: Secondary | ICD-10-CM | POA: Diagnosis not present

## 2022-06-15 DIAGNOSIS — R293 Abnormal posture: Secondary | ICD-10-CM | POA: Diagnosis not present

## 2022-06-16 DIAGNOSIS — I10 Essential (primary) hypertension: Secondary | ICD-10-CM | POA: Diagnosis not present

## 2022-06-20 DIAGNOSIS — M542 Cervicalgia: Secondary | ICD-10-CM | POA: Diagnosis not present

## 2022-06-20 DIAGNOSIS — R293 Abnormal posture: Secondary | ICD-10-CM | POA: Diagnosis not present

## 2022-06-23 DIAGNOSIS — R600 Localized edema: Secondary | ICD-10-CM | POA: Diagnosis not present

## 2022-06-23 DIAGNOSIS — I10 Essential (primary) hypertension: Secondary | ICD-10-CM | POA: Diagnosis not present

## 2022-06-23 DIAGNOSIS — D692 Other nonthrombocytopenic purpura: Secondary | ICD-10-CM | POA: Diagnosis not present

## 2022-06-23 DIAGNOSIS — K5900 Constipation, unspecified: Secondary | ICD-10-CM | POA: Diagnosis not present

## 2022-06-23 DIAGNOSIS — G2581 Restless legs syndrome: Secondary | ICD-10-CM | POA: Diagnosis not present

## 2022-06-23 DIAGNOSIS — Z23 Encounter for immunization: Secondary | ICD-10-CM | POA: Diagnosis not present

## 2022-06-23 DIAGNOSIS — R2681 Unsteadiness on feet: Secondary | ICD-10-CM | POA: Diagnosis not present

## 2022-06-23 DIAGNOSIS — M810 Age-related osteoporosis without current pathological fracture: Secondary | ICD-10-CM | POA: Diagnosis not present

## 2022-06-23 DIAGNOSIS — Z0000 Encounter for general adult medical examination without abnormal findings: Secondary | ICD-10-CM | POA: Diagnosis not present

## 2022-06-23 DIAGNOSIS — R413 Other amnesia: Secondary | ICD-10-CM | POA: Diagnosis not present

## 2022-06-23 DIAGNOSIS — E039 Hypothyroidism, unspecified: Secondary | ICD-10-CM | POA: Diagnosis not present

## 2022-06-27 DIAGNOSIS — R300 Dysuria: Secondary | ICD-10-CM | POA: Diagnosis not present

## 2022-06-29 DIAGNOSIS — R293 Abnormal posture: Secondary | ICD-10-CM | POA: Diagnosis not present

## 2022-06-29 DIAGNOSIS — M542 Cervicalgia: Secondary | ICD-10-CM | POA: Diagnosis not present

## 2022-07-04 DIAGNOSIS — M542 Cervicalgia: Secondary | ICD-10-CM | POA: Diagnosis not present

## 2022-07-04 DIAGNOSIS — R293 Abnormal posture: Secondary | ICD-10-CM | POA: Diagnosis not present

## 2022-07-06 DIAGNOSIS — R293 Abnormal posture: Secondary | ICD-10-CM | POA: Diagnosis not present

## 2022-07-06 DIAGNOSIS — M542 Cervicalgia: Secondary | ICD-10-CM | POA: Diagnosis not present

## 2022-07-12 DIAGNOSIS — M542 Cervicalgia: Secondary | ICD-10-CM | POA: Diagnosis not present

## 2022-07-12 DIAGNOSIS — R293 Abnormal posture: Secondary | ICD-10-CM | POA: Diagnosis not present

## 2022-07-13 DIAGNOSIS — R350 Frequency of micturition: Secondary | ICD-10-CM | POA: Diagnosis not present

## 2022-07-13 DIAGNOSIS — N390 Urinary tract infection, site not specified: Secondary | ICD-10-CM | POA: Diagnosis not present

## 2022-07-18 DIAGNOSIS — M542 Cervicalgia: Secondary | ICD-10-CM | POA: Diagnosis not present

## 2022-07-18 DIAGNOSIS — R293 Abnormal posture: Secondary | ICD-10-CM | POA: Diagnosis not present

## 2022-07-25 DIAGNOSIS — M542 Cervicalgia: Secondary | ICD-10-CM | POA: Diagnosis not present

## 2022-07-25 DIAGNOSIS — R293 Abnormal posture: Secondary | ICD-10-CM | POA: Diagnosis not present

## 2022-07-27 DIAGNOSIS — M542 Cervicalgia: Secondary | ICD-10-CM | POA: Diagnosis not present

## 2022-07-27 DIAGNOSIS — R293 Abnormal posture: Secondary | ICD-10-CM | POA: Diagnosis not present

## 2022-07-28 DIAGNOSIS — R221 Localized swelling, mass and lump, neck: Secondary | ICD-10-CM | POA: Diagnosis not present

## 2022-07-28 DIAGNOSIS — I10 Essential (primary) hypertension: Secondary | ICD-10-CM | POA: Diagnosis not present

## 2022-07-28 DIAGNOSIS — R413 Other amnesia: Secondary | ICD-10-CM | POA: Diagnosis not present

## 2022-07-28 DIAGNOSIS — Z23 Encounter for immunization: Secondary | ICD-10-CM | POA: Diagnosis not present

## 2022-07-28 DIAGNOSIS — M810 Age-related osteoporosis without current pathological fracture: Secondary | ICD-10-CM | POA: Diagnosis not present

## 2022-08-01 DIAGNOSIS — H538 Other visual disturbances: Secondary | ICD-10-CM | POA: Diagnosis not present

## 2022-08-01 DIAGNOSIS — Z961 Presence of intraocular lens: Secondary | ICD-10-CM | POA: Diagnosis not present

## 2022-08-02 DIAGNOSIS — R221 Localized swelling, mass and lump, neck: Secondary | ICD-10-CM | POA: Diagnosis not present

## 2022-08-03 DIAGNOSIS — M542 Cervicalgia: Secondary | ICD-10-CM | POA: Diagnosis not present

## 2022-08-03 DIAGNOSIS — R293 Abnormal posture: Secondary | ICD-10-CM | POA: Diagnosis not present

## 2022-08-08 DIAGNOSIS — M542 Cervicalgia: Secondary | ICD-10-CM | POA: Diagnosis not present

## 2022-08-08 DIAGNOSIS — R293 Abnormal posture: Secondary | ICD-10-CM | POA: Diagnosis not present

## 2022-08-15 DIAGNOSIS — R293 Abnormal posture: Secondary | ICD-10-CM | POA: Diagnosis not present

## 2022-08-15 DIAGNOSIS — M542 Cervicalgia: Secondary | ICD-10-CM | POA: Diagnosis not present

## 2022-08-17 DIAGNOSIS — R293 Abnormal posture: Secondary | ICD-10-CM | POA: Diagnosis not present

## 2022-08-17 DIAGNOSIS — M542 Cervicalgia: Secondary | ICD-10-CM | POA: Diagnosis not present

## 2022-08-24 DIAGNOSIS — M542 Cervicalgia: Secondary | ICD-10-CM | POA: Diagnosis not present

## 2022-08-24 DIAGNOSIS — R293 Abnormal posture: Secondary | ICD-10-CM | POA: Diagnosis not present

## 2022-09-01 DIAGNOSIS — N390 Urinary tract infection, site not specified: Secondary | ICD-10-CM | POA: Diagnosis not present

## 2022-09-01 DIAGNOSIS — N3020 Other chronic cystitis without hematuria: Secondary | ICD-10-CM | POA: Diagnosis not present

## 2022-09-09 ENCOUNTER — Other Ambulatory Visit: Payer: Self-pay | Admitting: Otolaryngology

## 2022-09-09 DIAGNOSIS — R293 Abnormal posture: Secondary | ICD-10-CM | POA: Diagnosis not present

## 2022-09-09 DIAGNOSIS — M542 Cervicalgia: Secondary | ICD-10-CM | POA: Diagnosis not present

## 2022-09-09 DIAGNOSIS — R221 Localized swelling, mass and lump, neck: Secondary | ICD-10-CM | POA: Diagnosis not present

## 2022-09-12 DIAGNOSIS — R293 Abnormal posture: Secondary | ICD-10-CM | POA: Diagnosis not present

## 2022-09-12 DIAGNOSIS — M542 Cervicalgia: Secondary | ICD-10-CM | POA: Diagnosis not present

## 2022-09-14 ENCOUNTER — Ambulatory Visit
Admission: RE | Admit: 2022-09-14 | Discharge: 2022-09-14 | Disposition: A | Discharge status: Home / Self Care | Payer: Medicare Other | Source: Ambulatory Visit | Attending: Otolaryngology | Admitting: Otolaryngology

## 2022-09-14 DIAGNOSIS — R221 Localized swelling, mass and lump, neck: Secondary | ICD-10-CM | POA: Diagnosis not present

## 2022-09-14 MED ORDER — IOPAMIDOL (ISOVUE-300) INJECTION 61%
75.0000 mL | Freq: Once | INTRAVENOUS | Status: AC | PRN
  Administered 2022-09-14: 75 mL via INTRAVENOUS

## 2022-09-17 ENCOUNTER — Other Ambulatory Visit: Payer: Self-pay | Admitting: Cardiovascular Disease

## 2022-09-20 DIAGNOSIS — K118 Other diseases of salivary glands: Secondary | ICD-10-CM | POA: Diagnosis not present

## 2022-09-21 ENCOUNTER — Ambulatory Visit: Payer: Medicare Other | Admitting: Podiatry

## 2022-09-21 ENCOUNTER — Encounter: Payer: Self-pay | Admitting: Podiatry

## 2022-09-21 VITALS — BP 116/56 | HR 72

## 2022-09-21 DIAGNOSIS — M79674 Pain in right toe(s): Secondary | ICD-10-CM | POA: Diagnosis not present

## 2022-09-21 DIAGNOSIS — M79675 Pain in left toe(s): Secondary | ICD-10-CM | POA: Diagnosis not present

## 2022-09-21 DIAGNOSIS — B351 Tinea unguium: Secondary | ICD-10-CM

## 2022-09-21 NOTE — Progress Notes (Signed)
   Chief Complaint  Patient presents with   Nail Problem    "I have an ingrown toenail." N - painful toenail L - hallux right D - 2 mos O - gradually worse C - tender, sore A - pressure, shoes T - Neosporin    SUBJECTIVE Patient presents to office today complaining of elongated, thickened nails that cause pain while ambulating in shoes.  Patient is unable to trim their own nails. Patient is here for further evaluation and treatment.  Past Medical History:  Diagnosis Date   Heart disease    Hypertension    IBS (irritable bowel syndrome)    Stroke ([facility]) 04/17/2018   around 2011   TIA (transient ischemic attack)     Allergies  Allergen Reactions   Duloxetine Other (See Comments)    unknown   Erythromycin    Mirtazapine     Other reaction(s): Other (See Comments) unknown   Trazodone     Other reaction(s): Other (See Comments) unknown   Zolpidem     Other reaction(s): Other (See Comments) unknown   Cyclobenzaprine Anxiety    Unknown   Diazepam Rash    unknown     OBJECTIVE General Patient is awake, alert, and oriented x 3 and in no acute distress. Derm Skin is dry and supple bilateral. Negative open lesions or macerations. Remaining integument unremarkable. Nails are tender, long, thickened and dystrophic with subungual debris, consistent with onychomycosis, 1-5 bilateral. No signs of infection noted. Vasc  DP and PT pedal pulses palpable bilaterally. Temperature gradient within normal limits.  Neuro Epicritic and protective threshold sensation grossly intact bilaterally.  Musculoskeletal Exam No symptomatic pedal deformities noted bilateral. Muscular strength within normal limits.  ASSESSMENT 1.  Pain due to onychomycosis of toenails both  PLAN OF CARE 1. Patient evaluated today.  2. Instructed to maintain good pedal hygiene and foot care.  3. Mechanical debridement of nails 1-5 bilaterally performed using a nail nipper. Filed with dremel without incident.   4. Return to clinic in 3 mos.    Felecia Shelling, DPM [facility]  Dr. Felecia Shelling, DPM    [address] [place], [place] [address]                Office [phone]  Fax [phone]

## 2022-09-22 ENCOUNTER — Other Ambulatory Visit (HOSPITAL_COMMUNITY): Payer: Self-pay | Admitting: Otolaryngology

## 2022-09-22 DIAGNOSIS — M542 Cervicalgia: Secondary | ICD-10-CM | POA: Diagnosis not present

## 2022-09-22 DIAGNOSIS — R293 Abnormal posture: Secondary | ICD-10-CM | POA: Diagnosis not present

## 2022-09-22 DIAGNOSIS — K118 Other diseases of salivary glands: Secondary | ICD-10-CM

## 2022-10-03 NOTE — Progress Notes (Signed)
Oley Balm, MD  Leodis Rains D Needs US neck (see below) then resubmit Thx DDH       Previous Messages    ----- Message ----- From: Scarlette Ar, MD Sent: 10/03/2022   6:54 AM EDT To: Oley Balm, MD Subject: RE: [place] FNA Neck 2nd request                    That sounds fine. ----- Message ----- From: Oley Balm, MD Sent: 09/30/2022   9:57 AM EDT To: Scarlette Ar, MD Subject: RE: [place] FNA Neck 2nd request                    Lets get [place] see if there is a focal lesion to target before tying up a biopsy slot. CT did not show anything. Unless you think there is some diffuse infiltrative lesion of the gland and just needs nontargeted biopsy. Let me know.  ----- Message ----- From: Scarlette Ar, MD Sent: 09/26/2022  11:15 PM EDT To: Oley Balm, MD Subject: RE: [place] FNA Neck 2nd request                    Hi Daniel.  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 On physician exam she has a visible growth in her left submandibular triangle. Would you be able to sample this area to ensure no malignancy?  Thanks  Scarlette Ar Cell [phone] ----- Message ----- From: Oley Balm, MD Sent: 09/26/2022   2:31 PM EDT To: Scarlette Ar, MD; Dorise Hiss Subject: FW: [place] FNA Neck 2nd request                    No lesion on CT Please clarify request Thanks Oley Balm MD IR

## 2022-10-07 ENCOUNTER — Ambulatory Visit (HOSPITAL_COMMUNITY)
Admission: RE | Admit: 2022-10-07 | Discharge: 2022-10-07 | Disposition: A | Discharge status: Home / Self Care | Payer: Medicare Other | Source: Ambulatory Visit | Attending: Otolaryngology | Admitting: Otolaryngology

## 2022-10-07 ENCOUNTER — Encounter (HOSPITAL_COMMUNITY): Payer: Self-pay | Admitting: Otolaryngology

## 2022-10-07 DIAGNOSIS — K118 Other diseases of salivary glands: Secondary | ICD-10-CM | POA: Diagnosis not present

## 2022-10-07 DIAGNOSIS — R220 Localized swelling, mass and lump, head: Secondary | ICD-10-CM | POA: Diagnosis not present

## 2022-10-07 NOTE — Progress Notes (Signed)
Suttle, Thressa Sheller, MD  Leodis Rains D I just messaged Dr. Ernestene Kiel about this - no indication for bx, can cancel order.  Thanks,  [employer]

## 2022-10-25 ENCOUNTER — Encounter: Payer: Self-pay | Admitting: Cardiovascular Disease

## 2022-10-25 ENCOUNTER — Ambulatory Visit: Payer: Medicare Other | Attending: Cardiovascular Disease | Admitting: Cardiovascular Disease

## 2022-10-25 VITALS — BP 108/58 | HR 69 | Ht 62.0 in | Wt 129.4 lb

## 2022-10-25 DIAGNOSIS — I10 Essential (primary) hypertension: Secondary | ICD-10-CM | POA: Diagnosis not present

## 2022-10-25 NOTE — Progress Notes (Signed)
Patient ID: Valerie Barker, female   DOB: 1922-12-06, 89 y.o.   MRN: [id number]       HPI: Valerie Barker is a 89 y.o. female who presents to the office for a 13 month cardiology followup evaluation.  Valerie Barker is the wife of my former patient Valerie Barker who died on 04-06-2015.  Remotely, Valerie Barker suffered a TIA and has documented old asymptomatic lacunar infarcts. Prior to undergoing urologic surgery at [facility] in 2012 a nuclear perfusion study showed normal perfusion. An echo Doppler study demonstrated mild to moderate TR, trace MR, and mild aortic sclerosis without stenosis. She had normal systolic function. Carotid studies done in 2011 were normal.  She was hospitalized for 2 nights after experiencing an TIA on 01/08/2013. Her symptoms resolved spontaneously. A CT of her head showed mild atrophy with extensive supratentorial small vessel disease without intracranial mass, hemorrhage or acute appearing infarction. An MRI of her brain did not show any acute intracranial findings but did show advanced atrophy with chronic microvascular ischemic changes. MRA did not reveal any intracranial flow reducing lesions. During that hospitalization a f/u echo Doppler study  on 01/29/2013 showed an ejection fraction at 65%. There was mild aortic sclerosis without stenosis. Carotid Doppler study showed minimal plaque with less than 39% reduction. She  was sent home on atorvastatin 10 mg Plavix 75 mg in addition to her valsartan HCT 320/12.5 and zolpidem.  She has had issues with swelling of legs and feet.  She also has continued issues with restless legs.  She has been taking requip 0.5 mg shortly before going to bed, but it takes a while for the medication to work and usually does not completely suffice.  At times she has taken a half a pill if she is to have a daytime nap.  When I last saw her, she was complaining of experiencing a "bobble" sensation.  She underwent carotid duplex  imaging which was normal in November 2016.  She denies any episodes of chest pain.  She is unaware of palpitations.  She admits to being fatigued , but actually feels improved from previously.  At times if she is on her back for certain duration.  She does note transient right arm paresthesias.  She denies associated chest pressure.  She presents for evaluation.  She underwent extensive dental work with her dentist, Valerie Barker, and endodontist.  She also had eyelid lift surgery at  [facility] in December 2017 and tolerated this well from a cardiovascular standpoint.  When I last saw she felt well was experiencing significant tiredness and fatigue.  At times she noted  some dizziness with movement.  She has restless legs for which she is on requip in addition to gabapentin.  She denied chest pain.  Was unaware of palpitations.  I saw her in May 2019 following an episode of dizziness.  As she was getting off the toilet and was trying to get or toilet paper she bent over into the cabinet and when she stood up she became dizzy lightheaded fell backwards and hit her head.  Since she was on chronic Plavix therapy she presented to the emergency room for evaluation.  No acute abnormality was noted.  However, her pulse in the emergency room was 52 and blood pressure 107/41.  She has been on losartan HCT 100/25 mg, Toprol-XL 12.5 mg and this has been held if her heart rate is less than 55.  I reviewed her emergency room evaluation.  At that time, I elected to wean and discontinue her very low-dose Toprol-XL 12.5 mg.    I saw her in September 2019. Unfortunately her 43 year old sister passed away 4 weeks ago.  She had spoken to her sister on a daily basis.  Valerie Barker continues to live in independent living at [facility].  She denies chest pain.  She is unaware of palpitations.  She has not required use of HCTZ.  Continues to be on valsartan 320 mg for hypertension.  She is tolerating atorvastatin for  hyperlipidemia.   She has continued to live in independent living at [facility].  I last evaluated her in a telemedicine visit in January 2021.  At that time she denied  any chest pain or shortness of breath or palpitations.  She admits to significant fatigability.  She believes she is sleeping a lot of the time.  She does sleep well at night.  She has not been as active as she had in the past due to the COVID-19 pandemic.  She was concerned about the possibility of early Alzheimer's disease since she is starting to notice some short-term memory loss.  She also admits to swelling in her right foot and was given HCTZ 25 mg by Dr. Renne Crigler..   She was evaluated by Valerie Course, PA in April 2021.  At that time she was taking HCTZ 25 mg daily rather than as needed as result of leg swelling.  He was concerned that her dependent edema was due to venous stasis and recommended she change this back to just as needed.  He scheduled her for an echo Doppler study which was done on 08/11/2019 which showed an EF of 60 to 65%.  There is grade 1 diastolic dysfunction.  There were no wall motion abnormalities.  She had mildly increased RV systolic pressure 35 mg.  There was mild aortic sclerosis without stenosis.     She was evaluated by Valerie Barker of [facility] neurology.  She had previously seen Dr. Marjory Lies and has been on treatment for restless leg syndrome.  She was evaluated for memory issues but due to complaints of significant fatigability and sleepiness she was referred for a sleep study at their office which was done on 11/03/2019.  This apparently showed mild overall sleep apnea with an AHI of 10.5/h.  AHI during REM sleep was 0.  There was concern that she may have had heart rate irregularity throughout the test and her cyclic apnea seem to correlate with her irregular heart rate and rhythm.  There was concern of whether or not she had atrial fibrillation and cardiology evaluation was advised.  She was  evaluated in the  emergency room after a mechanical fall when she was walking with her walker and tripped between the transition of the hardwood floor on carpet.  She sustained injury to her right thumb and apparently is now wearing a thumb splint for small fracture.  I saw her in September 2021 at which time she denied any chest pain or shortness of breath.  She admits to fatigue and daytime sleepiness.  She was taking hydrochlorothiazide daily, olmesartan 40 mg, and requipfor her restless legs.  She also is on atorvastatin for hyperlipidemia and levothyroxine for hypothyroidism.  She is unaware of any palpitations.  Of note, in the past when very low-dose beta-blocker therapy was instituted she did get bradycardic in the low 50s.  During that evaluation, I reviewed her sleep study and felt she most likely had frequent PACs.  I saw her in December 2021.  She continued to live in independent living at [facility].  She denies any chest pain or significant shortness of breath.  She did experience occasional feet and ankle swelling.  She admitted to being tired frequently but was sleeping well.  With her feet and ankle edema I suggested over the next 3 days she increase her HCT dose to 25 mg and depending upon swelling to take either 12.5 mg and every now and then an extra dose for 25 mg perhaps every third day as needed.  She continued to be on olmesartan 40 mg for blood pressure and was on levothyroxine.  She continued to be on Plavix with her history of TIA and was on ropinirole for her restless leg syndrome.  I saw her on June 24, 2020.  At that time she admitted to being very tired and could not sleep at any time.  She was sleeping well at night.  She has a caregiver in the afternoon from noon until 5 PM.  She also has started physical therapy.  Her physical therapist had noted some mild heart rate irregularity and our office was notified.  She denied any chest pain.  She denied any dizziness.  Her last echo Doppler study  in May 2021 showed an EF of 60 to 65% with grade 1 diastolic dysfunction, mild aortic sclerosis without stenosis and normal right heart pressures.  There was mild concentric LVH.  During that evaluation her blood pressure was low and a repeat by me was 110/60.  I suggested she decrease olmesartan from 40 mg down to 20 mg and continue the HCTZ as prescribed for leg edema.  Her ECG showed isolated PVCs with a ventricular rate at 60.  However with her prior beta-blocker induced bradycardia I did not recommend reinstitution of beta-blockade in the setting of normal LV function.  I  saw her in July 2022.  She continues to live in independent living at [facility].  She has a caregiver through [facility] and she states the caregivers have been excellent.  She is able to walk with a walker.  She denies any chest pain.  She does note mild shortness of breath if she tries to walk fast but otherwise feels great.  She is sleeping well.  She denies any dizziness.  She has not had any recent falls.  She is unaware of palpitations.   I last saw her on Aug 25, 2021 at which time she continued to feel well.   She admits to taking frequent naps during the day.  She often goes to bed at midnight and wakes up at 9 AM.  Her caregiver is with her from noon until 5 PM.  Since last July 2022 she fell on 1 occasion and over the last 3 years had 3 falls which often occurred by tripping over her rug.  She sees Dr. Merri Brunette for primary care.  She denies any chest pain or palpitations.  At that evaluation her blood pressure was stable on a regimen of HCTZ 12.5 mg alternating every other day with 25 mg and olmesartan 20 mg daily.  She continued to be on rec quit for restless legs and continue to be on Plavix following her TIA.  She was tolerating low-dose atorvastatin.  Since I saw her, she has been evaluated by Micah Flesher in January and most recently on June 02, 2022.  When seen in January 2024 she had noticed nonexertional  chest pain.  She underwent an echo Doppler study on May 17, 2022 which revealed hyperdynamic LV function with EF 70 to 75%, grade 2 diastolic dysfunction, severe left atrial enlargement, moderate TR, and moderate pulmonic valve regurgitation.  Presently, Ms. Canevari feels well.  She is here with her care worker.  She admits to mild shortness of breath but denies any chest pain.  She tells me 1 month ago she rode a stationary exercise bike for 7 and 8 minutes without difficulty.  She continues to see Dr. Merlinda Frederick far will be checking laboratory next month.  She continues to be on HCTZ and olmesartan as noted above.  She is on rec whip for restless legs.  She is on clopidogrel following her TIA and has no longer on aspirin.  She continues to be on atorvastatin with management at one half of the 10 mg pill.  She presents for evaluation   Past Medical History:  Diagnosis Date   Heart disease    Hypertension    IBS (irritable bowel syndrome)    Stroke (HCC) 04/17/2018   around 2011   TIA (transient ischemic attack)     Past Surgical History:  Procedure Laterality Date   ABDOMINAL HYSTERECTOMY  1978   BUNIONECTOMY  2006, 1996   LAPAROSCOPIC SALPINGO OOPHERECTOMY  1988    Allergies  Allergen Reactions   Duloxetine Other (See Comments)    unknown   Erythromycin    Mirtazapine     Other reaction(s): Other (See Comments) unknown   Trazodone     Other reaction(s): Other (See Comments) unknown   Zolpidem     Other reaction(s): Other (See Comments) unknown   Cyclobenzaprine Anxiety    Unknown   Diazepam Rash    unknown    Current Outpatient Medications  Medication Sig Dispense Refill   atorvastatin (LIPITOR) 10 MG tablet TAKE 1/2 TABLET EVERY EVENING 135 tablet 3   Biotin 06301 MCG TABS Take 10,000 mcg by mouth every evening.      Calcium Carbonate-Vitamin D (CALCIUM-D PO) Take 1 tablet by mouth every evening. 1200/500     Cholecalciferol (VITAMIN D) 2000 UNITS CAPS Take 2,000  Units by mouth every morning.      clopidogrel (PLAVIX) 75 MG tablet Take 75 mg by mouth daily.     Cyanocobalamin (VITAMIN B-12 PO) Take 2,500 mcg by mouth every morning.      donepezil (ARICEPT) 10 MG tablet Take 10 mg by mouth at bedtime.     gabapentin (NEURONTIN) 100 MG capsule Take by mouth.     hydrochlorothiazide (HYDRODIURIL) 12.5 MG tablet TAKE 1 OR 2 TABLETS BY MOUTH EVERY DAY 90 tablet 3   levothyroxine (SYNTHROID) 25 MCG tablet Take 25 mcg by mouth daily before breakfast.      Multiple Vitamin (MULTIVITAMIN) capsule Take 1 capsule by mouth every morning.     olmesartan (BENICAR) 20 MG tablet Take 1 tablet (20 mg total) by mouth daily. 90 tablet 3   penicillin v potassium (VEETID) 500 MG tablet Take 3 tablets by mouth daily.     rOPINIRole (REQUIP) 1 MG tablet Take 1-2 mg by mouth See admin instructions. Takes 1 at evening, 2 at bedtime     trimethoprim (TRIMPEX) 100 MG tablet Take 100 mg by mouth every morning.     No current facility-administered medications for this visit.    Social History   Socioeconomic History   Marital status: Married    Spouse name: Not on file   Number of children: Not  on file   Years of education: Not on file   Highest education level: Not on file  Occupational History   Not on file  Tobacco Use   Smoking status: Never   Smokeless tobacco: Never  Vaping Use   Vaping status: Never Used  Substance and Sexual Activity   Alcohol use: No   Drug use: No   Sexual activity: Not Currently  Other Topics Concern   Not on file  Social History Narrative   Lives in Independent Living at [facility].  Education BA degree.  Retired.  3 children.  Son, [place].  Caffeine 1=2 cups daily.   Social Determinants of Health   Financial Resource Strain: Not on file  Food Insecurity: Not on file  Transportation Needs: Not on file  Physical Activity: Not on file  Stress: Not on file  Social Connections: Not on file  Intimate Partner Violence: Not on file    Socially she is widowed as of December 2016.  Her husband was a former Public house manager of education that [employer] and developed progressive dementia Prior to his death  Family History  Problem Relation Age of Onset   COPD Mother    Schizophrenia Brother    Stroke Father    Breast cancer Neg Hx    ROS General: Negative; No fevers, chills, or night sweats;  HEENT: Negative; No changes in vision or hearing, sinus congestion, difficulty swallowing Pulmonary: Negative; No cough, wheezing, shortness of breath, hemoptysis Cardiovascular: Negative; No chest pain, presyncope, syncope, palpitations Positive for leg swelling  GI: Negative; No nausea, vomiting, diarrhea, or abdominal pain GU: Positive for recurrent urinary incontinence despite her prior urologic surgery; No dysuria, hematuria, or difficulty voiding Musculoskeletal: History of right thumb fracture Hematologic/Oncology: Negative; no easy bruising, bleeding Endocrine: Negative; no heat/cold intolerance; no diabetes Neuro: Positive for decreased short-term memory and history of TIA; Episode of right arm numbness in July 2017; no changes in balance, headaches Skin: Negative; No rashes or skin lesions Psychiatric: Negative; No behavioral problems, depression Sleep: Positive for restless leg syndrome; positive for daytime sleepiness no snoring,  bruxism, hypnogognic hallucinations, no cataplexy Other comprehensive 14 point system review is negative.   PE BP (!) 108/58 (BP Location: Left Arm, Patient Position: Sitting, Cuff Size: Normal)   Pulse 69   Ht 5\' 2"  (1.575 m)   Wt 129 lb 6.4 oz (58.7 kg)   SpO2 95%   BMI 23.67 kg/m    Repeat blood pressure by me was 112/60  Wt Readings from Last 3 Encounters:  10/25/22 129 lb 6.4 oz (58.7 kg)  06/02/22 130 lb 12.8 oz (59.3 kg)  04/27/22 134 lb 9.6 oz (61.1 kg)   General: Alert, oriented, no distress.  Skin: normal turgor, no rashes, warm and dry HEENT: Normocephalic, atraumatic.  Pupils equal round and reactive to light; sclera anicteric; extraocular muscles intact;  Nose without nasal septal hypertrophy Mouth/Parynx benign; Mallinpatti scale 3 Neck: No JVD, no carotid bruits; normal carotid upstroke Lungs: clear to ausculatation and percussion; no wheezing or rales Chest wall: without tenderness to palpitation Heart: PMI not displaced, RRR, PACs, s1 s2 normal, 1/6 systolic murmur, no diastolic murmur, no rubs, gallops, thrills, or heaves Abdomen: soft, nontender; no hepatosplenomehaly, BS+; abdominal aorta nontender and not dilated by palpation. Back: no CVA tenderness Pulses 2+ Musculoskeletal: full range of motion, normal strength, no joint deformities Extremities: no clubbing cyanosis or edema, Homan's sign negative  Neurologic: grossly nonfocal; Cranial nerves grossly wnl Psychologic: Normal mood and affect  EKG Interpretation Date/Time:  Tuesday October 25 2022 13:14:45 EDT Ventricular Rate:  69 PR Interval:  148 QRS Duration:  80 QT Interval:  404 QTC Calculation: 432 R Axis:   42  Text Interpretation: Sinus rhythm with Premature supraventricular complexes and with occasional Premature ventricular complexes Cannot rule out Anterior infarct , age undetermined When compared with ECG of 15-Nov-2021 06:32, PREVIOUS ECG IS PRESENT Confirmed by Nicki Guadalajara ([id number]) on 10/25/2022 2:12:11 PM    Aug 25, 2021 ECG (independently read by me):  Sinus rhythm at 73 with PACs  October 19, 2020 ECG (independently read by me): Sinus bradycardia at 55 bpm.  No ectopy.  Normal intervals.  Low voltage precordial leads  June 24, 2020 ECG (independently read by me): Sinus rhythm at 60, PVCs     March 05, 2020 ECG (independently read by me): NSR at 16, PAC in transient trigeminal pattern  September 2021 ECG (independently read by me): Sinus rhythm with frequent PACs, QTc interval 412 ms.  No ST segment changes   September 2019 ECG (independently read by me): Normal sinus  rhythm at 61 bpm.  No ectopy.  Normal intervals.  September 2018 ECG (independently read by me): sinus bradycardia 53 bpm.  Normal intervals.  No ST segment changes.  Decreased voltage anterolaterally  January 2018 ECG (independently read by me): Sinus bradycardia 57 bpm.  No ST segment changes.  There was malpositioning of her anterior leads.  May 2017 ECG (independently read by me): Sinus bradycardia 53 bpm.  No ectopy.  Normal intervals.  No ST segment changes.  November 2016 ECG (independently read by me): Sinus bradycardia 58 bpm.  Normal intervals.    October 2015 ECG (and apparently read by me) : Sinus rhythm with frequent PACs and transient atrial bigeminal pattern  Prior November 2014 ECG: Sinus rhythm with occasional PACs. Intervals are normal. Nonspecific T changes.  LABS:    Latest Ref Rng & Units 11/15/2021    6:43 AM 11/15/2021    6:30 AM 12/12/2019    4:09 PM  BMP  Glucose 70 - 99 mg/dL 846  962  97   BUN 8 - 23 mg/dL 23  23  18    Creatinine 0.44 - 1.00 mg/dL 9.52  8.41  3.24   BUN/Creat Ratio 12 - 28   19   Sodium 135 - 145 mmol/L 140  139  138   Potassium 3.5 - 5.1 mmol/L 3.6  3.6  4.6   Chloride 98 - 111 mmol/L 102  104  100   CO2 22 - 32 mmol/L  26  27   Calcium 8.9 - 10.3 mg/dL  9.9  40.1       Latest Ref Rng & Units 04/19/2018    6:21 AM 04/18/2018    8:47 AM 04/17/2018   12:46 PM  Hepatic Function  Total Protein 6.5 - 8.1 g/dL 5.7  5.9  6.1   Albumin 3.5 - 5.0 g/dL 3.3  3.6  3.9   AST 15 - 41 U/L 25  25  23    ALT 0 - 44 U/L 20  21  21    Alk Phosphatase 38 - 126 U/L 42  47  51   Total Bilirubin 0.3 - 1.2 mg/dL 0.8  0.8  0.8       Latest Ref Rng & Units 11/15/2021    6:43 AM 11/15/2021    6:30 AM 07/12/2019   11:41 AM  CBC  WBC 4.0 - 10.5 K/uL  5.9  7.2   Hemoglobin 12.0 - 15.0 g/dL 56.2  13.0  86.5   Hematocrit 36.0 - 46.0 % 36.0  36.6  38.2   Platelets 150 - 400 K/uL  150  190    Lab Results  Component Value Date   TSH 5.800 (H) 07/12/2019      Lipid Panel     Component Value Date/Time   CHOL 128 04/18/2018 0458   TRIG 67 04/18/2018 0458   HDL 67 04/18/2018 0458   CHOLHDL 1.9 04/18/2018 0458   VLDL 13 04/18/2018 0458   LDLCALC 48 04/18/2018 0458     RADIOLOGY: Ct Head (brain) Wo Contrast  01/28/2013   CLINICAL DATA:  Right upper extremity weakness and numbness  EXAM: CT HEAD WITHOUT CONTRAST  TECHNIQUE: Contiguous axial images were obtained from the base of the skull through the vertex without intravenous contrast. Study was obtained within 24 hr of patient's arrival at the emergency department.  COMPARISON:  Brain MRI April 27, 2009 and brain CT April 27, 2009  FINDINGS: There is mild diffuse atrophy. There is no mass, hemorrhage, extra-axial fluid collection, or midline shift. There is extensive small vessel disease throughout the centra semiovale bilaterally, a stable finding. There is no new gray-white compartment lesion. There is no demonstrable acute infarct. Bony calvarium appears intact. The mastoid air cells are clear.  IMPRESSION: Mild atrophy with extensive supratentorial small vessel disease. No intracranial mass, hemorrhage, or acute appearing infarct.   Electronically Signed   By: Bretta Bang M.D.   On: 01/28/2013 14:42   Mr Brain Wo Contrast  01/28/2013   CLINICAL DATA:  Numbness in the arms. Dry hacking cough. History of hypertension and dyslipidemia.  EXAM: MRI HEAD WITHOUT CONTRAST  MRA HEAD WITHOUT CONTRAST  TECHNIQUE: Multiplanar, multiecho pulse sequences of the brain and surrounding structures were obtained without intravenous contrast. Angiographic images of the head were obtained using MRA technique without contrast.  COMPARISON:  CT 01/28/2013.  FINDINGS: MRI HEAD FINDINGS  The patient was unable to remain motionless for the exam. Small or subtle lesions could be overlooked.  No evidence for acute infarction, hemorrhage, mass lesion, hydrocephalus, or extra-axial fluid. Moderate age-related atrophy.  Extensive chronic microvascular ischemic change. Basal ganglia mineralization but no foci of chronic hemorrhage. Flow voids are maintained. No osseous findings. No remote large vessel infarct. Bilateral cataract extraction. No acute sinus or mastoid fluid. Good general agreement with prior CT.  MRA HEAD FINDINGS  Grossly patent internal carotid arteries, and basilar artery. Vertebrals are codominant. No flow-limiting intracranial stenosis, branch occlusion, or aneurysm is evident on this motion degraded exam.  IMPRESSION: MRI HEAD IMPRESSION  No acute intracranial findings are evident. There is advanced atrophy with chronic microvascular ischemic change.  MRA HEAD IMPRESSION  No intracranial flow reducing lesion is evident.   Electronically Signed   By: Davonna Belling M.D.   On: 01/28/2013 18:47   Mr Maxine Glenn Head/brain Wo Cm  01/28/2013   CLINICAL DATA:  Numbness in the arms. Dry hacking cough. History of hypertension and dyslipidemia.  EXAM: MRI HEAD WITHOUT CONTRAST  MRA HEAD WITHOUT CONTRAST  TECHNIQUE: Multiplanar, multiecho pulse sequences of the brain and surrounding structures were obtained without intravenous contrast. Angiographic images of the head were obtained using MRA technique without contrast.  COMPARISON:  CT 01/28/2013.  FINDINGS: MRI HEAD FINDINGS  The patient was unable to remain motionless for the exam. Small or subtle lesions could be overlooked.  No evidence for acute infarction, hemorrhage, mass lesion, hydrocephalus,  or extra-axial fluid. Moderate age-related atrophy. Extensive chronic microvascular ischemic change. Basal ganglia mineralization but no foci of chronic hemorrhage. Flow voids are maintained. No osseous findings. No remote large vessel infarct. Bilateral cataract extraction. No acute sinus or mastoid fluid. Good general agreement with prior CT.  MRA HEAD FINDINGS  Grossly patent internal carotid arteries, and basilar artery. Vertebrals are codominant. No flow-limiting intracranial  stenosis, branch occlusion, or aneurysm is evident on this motion degraded exam.  IMPRESSION: MRI HEAD IMPRESSION  No acute intracranial findings are evident. There is advanced atrophy with chronic microvascular ischemic change.  MRA HEAD IMPRESSION  No intracranial flow reducing lesion is evident.   Electronically Signed   By: Davonna Belling M.D.   On: 01/28/2013 18:47   ECHO: 05/17/2022  1. Left ventricular ejection fraction, by estimation, is 70 to 75%. The  left ventricle has hyperdynamic function. The left ventricle has no  regional wall motion abnormalities. Left ventricular diastolic parameters  are consistent with Grade II diastolic  dysfunction (pseudonormalization). Elevated left ventricular end-diastolic  pressure.   2. Right ventricular systolic function is normal. The right ventricular  size is normal. There is moderately elevated pulmonary artery systolic  pressure.   3. Left atrial size was severely dilated.   4. Right atrial size was mildly dilated.   5. The mitral valve is normal in structure. Mild mitral valve  regurgitation. No evidence of mitral stenosis.   6. Tricuspid valve regurgitation is moderate.   7. The aortic valve is tricuspid. Aortic valve regurgitation is not  visualized. No aortic stenosis is present.   8. Pulmonic valve regurgitation is moderate.   9. The inferior vena cava is normal in size with greater than 50%  respiratory variability, suggesting right atrial pressure of 3 mmHg.    IMPRESSION:  1. Primary hypertension      ASSESSMENT AND PLAN: Ms. Soza is a 89 year old Caucasian female who has a history of prior TIA's and remotely had been  diagnosed as having a TIA with old asymptomatic lacunar infarct. On CT, MRI and MRA studies significant small vessel disease of her brain had been demonstrated.  She has normal systolic function and evidence for aortic sclerosis on echo assessment. She has been Plavix with her TIA history.  At a previous  evaluation, she had developed significant sinus bradycardia and ultimately her low-dose Toprol-XL 12.5 mg was weaned and DC'd.  At follow-up evaluation resting pulse was in the 60s.  An echo Doppler study in May 2021 showed EF at 60 to 65%, mild concentric LVH with grade 1 diastolic dysfunction.  Estimated RV systolic pressure was minimally increased at 35 mm.  There was mild aortic sclerosis without stenosis.  She has a history of increasing leg swelling which had improved with HCTZ.  On subsequent evaluations, due to low blood pressure her medical regimen has been reduced.  Presently, she is doing well without edema on her regimen of HCTZ 12.5 mg alternating with 25 mg every other day and olmesartan 20 mg daily.  Blood pressure today is stable.  She does note mild shortness of breath but no chest pain.  She is able to ride a stationary bike without difficulty.  I reviewed her most recent echo study from May 17, 2022 which showed hyperdynamic LV function with a EF 70 to 75%.  She had grade 2 diastolic dysfunction.  There was significant dilatation of her left atrium, mild right atrial dilatation, mild MR, moderate TR.Marland Kitchen  She will be  undergoing laboratory with Dr. Merri Brunette later this summer.  Her ECG is stable with occasional PACs.  I have recommended cardiology follow-up to receive Micah Flesher later this year and I will see her in the spring 2025.   Lennette Bihari, MD, [facility]  10/25/2022 2:06 PM

## 2022-10-25 NOTE — Patient Instructions (Signed)
Medication Instructions:  Your physician recommends that you continue on your current medications as directed. Please refer to the Current Medication list given to you today.   *If you need a refill on your cardiac medications before your next appointment, please call your pharmacy*   Lab Work: none If you have labs (blood work) drawn today and your tests are completely normal, you will receive your results only by: MyChart Message (if you have MyChart) OR A paper copy in the mail If you have any lab test that is abnormal or we need to change your treatment, we will call you to review the results.   Testing/Procedures: none   Follow-Up: At [facility], you and your health needs are our priority.  As part of our continuing mission to provide you with exceptional heart care, we have created designated Provider Care Teams.  These Care Teams include your primary Cardiologist (physician) and Advanced Practice Providers (APPs -  Physician Assistants and Nurse Practitioners) who all work together to provide you with the care you need, when you need it.  We recommend signing up for the patient portal called "MyChart".  Sign up information is provided on this After Visit Summary.  MyChart is used to connect with patients for Virtual Visits (Telemedicine).  Patients are able to view lab/test results, encounter notes, upcoming appointments, etc.  Non-urgent messages can be sent to your provider as well.   To learn more about what you can do with MyChart, go to [email or web address].    Your next appointment:   6 month(s)  Provider:   Micah Flesher PA

## 2022-10-29 ENCOUNTER — Encounter: Payer: Self-pay | Admitting: Cardiovascular Disease

## 2022-11-02 ENCOUNTER — Other Ambulatory Visit: Payer: Self-pay

## 2022-11-02 ENCOUNTER — Emergency Department (HOSPITAL_COMMUNITY): Payer: Medicare Other

## 2022-11-02 ENCOUNTER — Emergency Department (HOSPITAL_COMMUNITY)
Admission: EM | Admit: 2022-11-02 | Discharge: 2022-11-02 | Disposition: A | Discharge status: Home / Self Care | Payer: Medicare Other

## 2022-11-02 ENCOUNTER — Encounter (HOSPITAL_COMMUNITY): Payer: Self-pay

## 2022-11-02 DIAGNOSIS — M50321 Other cervical disc degeneration at C4-C5 level: Secondary | ICD-10-CM | POA: Diagnosis not present

## 2022-11-02 DIAGNOSIS — I6782 Cerebral ischemia: Secondary | ICD-10-CM | POA: Diagnosis not present

## 2022-11-02 DIAGNOSIS — M4312 Spondylolisthesis, cervical region: Secondary | ICD-10-CM | POA: Diagnosis not present

## 2022-11-02 DIAGNOSIS — I2510 Atherosclerotic heart disease of native coronary artery without angina pectoris: Secondary | ICD-10-CM | POA: Insufficient documentation

## 2022-11-02 DIAGNOSIS — I700 Atherosclerosis of aorta: Secondary | ICD-10-CM | POA: Diagnosis not present

## 2022-11-02 DIAGNOSIS — S0990XA Unspecified injury of head, initial encounter: Secondary | ICD-10-CM | POA: Diagnosis not present

## 2022-11-02 DIAGNOSIS — W1830XA Fall on same level, unspecified, initial encounter: Secondary | ICD-10-CM | POA: Diagnosis not present

## 2022-11-02 DIAGNOSIS — S199XXA Unspecified injury of neck, initial encounter: Secondary | ICD-10-CM | POA: Diagnosis not present

## 2022-11-02 DIAGNOSIS — Z79899 Other long term (current) drug therapy: Secondary | ICD-10-CM | POA: Diagnosis not present

## 2022-11-02 DIAGNOSIS — M25519 Pain in unspecified shoulder: Secondary | ICD-10-CM | POA: Diagnosis not present

## 2022-11-02 DIAGNOSIS — Z043 Encounter for examination and observation following other accident: Secondary | ICD-10-CM | POA: Diagnosis not present

## 2022-11-02 DIAGNOSIS — R079 Chest pain, unspecified: Secondary | ICD-10-CM | POA: Insufficient documentation

## 2022-11-02 DIAGNOSIS — W19XXXA Unspecified fall, initial encounter: Secondary | ICD-10-CM | POA: Diagnosis not present

## 2022-11-02 DIAGNOSIS — I10 Essential (primary) hypertension: Secondary | ICD-10-CM | POA: Insufficient documentation

## 2022-11-02 DIAGNOSIS — M25512 Pain in left shoulder: Secondary | ICD-10-CM | POA: Diagnosis not present

## 2022-11-02 DIAGNOSIS — M47812 Spondylosis without myelopathy or radiculopathy, cervical region: Secondary | ICD-10-CM | POA: Diagnosis not present

## 2022-11-02 DIAGNOSIS — R0789 Other chest pain: Secondary | ICD-10-CM | POA: Diagnosis not present

## 2022-11-02 DIAGNOSIS — I517 Cardiomegaly: Secondary | ICD-10-CM | POA: Diagnosis not present

## 2022-11-02 LAB — CBC WITH DIFFERENTIAL/PLATELET
Abs Immature Granulocytes: 0.02 10*3/uL (ref 0.00–0.07)
Basophils Absolute: 0 10*3/uL (ref 0.0–0.1)
Basophils Relative: 1 %
Eosinophils Absolute: 0.1 10*3/uL (ref 0.0–0.5)
Eosinophils Relative: 2 %
HCT: 39.6 % (ref 36.0–46.0)
Hemoglobin: 13.2 g/dL (ref 12.0–15.0)
Immature Granulocytes: 0 %
Lymphocytes Relative: 28 %
Lymphs Abs: 1.6 10*3/uL (ref 0.7–4.0)
MCH: 30.1 pg (ref 26.0–34.0)
MCHC: 33.3 g/dL (ref 30.0–36.0)
MCV: 90.4 fL (ref 80.0–100.0)
Monocytes Absolute: 0.6 10*3/uL (ref 0.1–1.0)
Monocytes Relative: 11 %
Neutro Abs: 3.3 10*3/uL (ref 1.7–7.7)
Neutrophils Relative %: 58 %
Platelets: 184 10*3/uL (ref 150–400)
RBC: 4.38 MIL/uL (ref 3.87–5.11)
RDW: 14.2 % (ref 11.5–15.5)
WBC: 5.6 10*3/uL (ref 4.0–10.5)
nRBC: 0 % (ref 0.0–0.2)

## 2022-11-02 LAB — COMPREHENSIVE METABOLIC PANEL
ALT: 32 U/L (ref 0–44)
AST: 35 U/L (ref 15–41)
Albumin: 4.1 g/dL (ref 3.5–5.0)
Alkaline Phosphatase: 53 U/L (ref 38–126)
Anion gap: 13 (ref 5–15)
BUN: 18 mg/dL (ref 8–23)
CO2: 24 mmol/L (ref 22–32)
Calcium: 9.6 mg/dL (ref 8.9–10.3)
Chloride: 101 mmol/L (ref 98–111)
Creatinine, Ser: 1.22 mg/dL — ABNORMAL HIGH (ref 0.44–1.00)
GFR, Estimated: 40 mL/min — ABNORMAL LOW (ref >60)
Glucose, Bld: 106 mg/dL — ABNORMAL HIGH (ref 70–99)
Potassium: 3.8 mmol/L (ref 3.5–5.1)
Sodium: 138 mmol/L (ref 135–145)
Total Bilirubin: 0.4 mg/dL (ref 0.3–1.2)
Total Protein: 6.9 g/dL (ref 6.5–8.1)

## 2022-11-02 LAB — TROPONIN I (HIGH SENSITIVITY)
Troponin I (High Sensitivity): 19 ng/L — ABNORMAL HIGH (ref <18)
Troponin I (High Sensitivity): 28 ng/L — ABNORMAL HIGH (ref <18)

## 2022-11-02 MED ORDER — SODIUM CHLORIDE 0.9 % IV BOLUS
500.0000 mL | Freq: Once | INTRAVENOUS | Status: AC
  Administered 2022-11-02: 500 mL via INTRAVENOUS

## 2022-11-02 MED ORDER — LIDOCAINE 5 % EX PTCH
1.0000 | MEDICATED_PATCH | Freq: Once | CUTANEOUS | Status: DC
  Administered 2022-11-02: 1 via TRANSDERMAL
  Filled 2022-11-02: qty 1

## 2022-11-02 MED ORDER — ROPINIROLE HCL 1 MG PO TABS
1.0000 mg | ORAL_TABLET | Freq: Once | ORAL | Status: AC
  Administered 2022-11-02: 1 mg via ORAL
  Filled 2022-11-02: qty 1

## 2022-11-02 MED ORDER — ACETAMINOPHEN 325 MG PO TABS
650.0000 mg | ORAL_TABLET | Freq: Once | ORAL | Status: AC
  Administered 2022-11-02: 650 mg via ORAL
  Filled 2022-11-02: qty 2

## 2022-11-02 NOTE — ED Provider Notes (Signed)
[facility] EMERGENCY DEPARTMENT AT [facility] Provider Note   CSN: [id number] Arrival date & time: 11/02/22  1136     History  Chief Complaint  Patient presents with   Valerie Barker    Valerie Barker is a 89 y.o. female.  89 year old female present emergency department after mechanical fall.  States she ambulates with a walker at baseline.  She stood up and walker was just out of reach.  She lost her balance and fell forward onto her face.  No LOC.  No nausea or vomiting.  She did report some pain to the top of her back/lower neck where the c-collar was placed in, but not reporting any pain from the fall on my initial evaluation.  She did noted that she started having some chest discomfort on arrival to the hospital.  But since resolved.  Reportedly saw her cardiologist several weeks ago and everything looked okay.  Denying any shortness of breath.  No abdominal pain.  Reportedly not on blood thinners.   Fall       Home Medications Prior to Admission medications   Medication Sig Start Date End Date Taking? Authorizing Provider  atorvastatin (LIPITOR) 10 MG tablet TAKE 1/2 TABLET EVERY EVENING Patient taking differently: Take 10 mg by mouth daily. 12/20/21  Yes Lennette Bihari, MD  Biotin 72536 MCG TABS Take 10,000 mcg by mouth every evening.    Yes [provider]  Calcium Carbonate-Vitamin D (CALCIUM-D PO) Take 1 tablet by mouth every evening. 1200/500   Yes [provider]  Cholecalciferol (VITAMIN D) 2000 UNITS CAPS Take 2,000 Units by mouth every morning.    Yes [provider]  clopidogrel (PLAVIX) 75 MG tablet Take 75 mg by mouth daily.   Yes [provider]  Cyanocobalamin (VITAMIN B-12 PO) Take 2,500 mcg by mouth every morning.    Yes [provider]  donepezil (ARICEPT) 10 MG tablet Take 10 mg by mouth at bedtime. 10/05/22  Yes [provider]  hydrochlorothiazide (HYDRODIURIL) 12.5 MG tablet TAKE 1 OR 2 TABLETS  BY MOUTH EVERY DAY Patient taking differently: Take 12.5 mg by mouth See admin instructions. Take 1 tablet daily, alternating with 2 tablets daily 09/19/22  Yes Lennette Bihari, MD  Multiple Vitamin (MULTIVITAMIN) tablet Take 1 tablet by mouth daily.   Yes [provider]  rOPINIRole (REQUIP) 1 MG tablet Take 1 mg by mouth See admin instructions. 2 tablets in the afternoon, 2 tablets in the evening. 05/26/18  Yes [provider]  trimethoprim (TRIMPEX) 100 MG tablet Take 100 mg by mouth every morning. 09/27/22  Yes [provider]  olmesartan (BENICAR) 20 MG tablet Take 1 tablet (20 mg total) by mouth daily. Patient not taking: Reported on 11/02/2022 06/24/20   Lennette Bihari, MD      Allergies    Duloxetine, Erythromycin, Mirtazapine, Trazodone, Zolpidem, Cyclobenzaprine, and Diazepam    Review of Systems   Review of Systems  Physical Exam Updated Vital Signs BP (!) 142/59   Pulse 90   Temp 97.8 F (36.6 C) (Oral)   Resp 19   Ht 5\' 2"  (1.575 m)   Wt 59 kg   SpO2 99%   BMI 23.79 kg/m  Physical Exam Vitals and nursing note reviewed.  Constitutional:      General: She is not in acute distress.    Appearance: She is not toxic-appearing.  HENT:     Head: Atraumatic.     Nose: Nose normal.  Mouth/Throat:     Mouth: Mucous membranes are moist.  Eyes:     Conjunctiva/sclera: Conjunctivae normal.  Neck:     Comments: In c-collar.  After CT scan removed and cleared. Cardiovascular:     Rate and Rhythm: Normal rate and regular rhythm.  Pulmonary:     Effort: Pulmonary effort is normal.     Breath sounds: Normal breath sounds.  Abdominal:     General: Abdomen is flat.     Palpations: Abdomen is soft.  Musculoskeletal:     Comments: Chest stable nontender.  Pelvis stable nontender.  No midline spinal tenderness.  No bony tenderness to her extremities.  Equal pulses throughout all extremities.  Good strength in all extremities.   Skin:    General: Skin  is warm.     Capillary Refill: Capillary refill takes less than 2 seconds.  Neurological:     Mental Status: She is alert and oriented to person, place, and time.  Psychiatric:        Mood and Affect: Mood normal.        Behavior: Behavior normal.     ED Results / Procedures / Treatments   Labs (all labs ordered are listed, but only abnormal results are displayed) Labs Reviewed  COMPREHENSIVE METABOLIC PANEL - Abnormal; Notable for the following components:      Result Value   Glucose, Bld 106 (*)    Creatinine, Ser 1.22 (*)    GFR, Estimated 40 (*)    All other components within normal limits  TROPONIN I (HIGH SENSITIVITY) - Abnormal; Notable for the following components:   Troponin I (High Sensitivity) 19 (*)    All other components within normal limits  CBC WITH DIFFERENTIAL/PLATELET  TROPONIN I (HIGH SENSITIVITY)    EKG None  Radiology DG Shoulder 1 View Left  Result Date: 11/02/2022 CLINICAL DATA:  Pain after fall EXAM: LEFT SHOULDER COMPARISON:  None Available. FINDINGS: Single limited portable view of the shoulder is without obvious fracture or dislocation. Preserved joint spaces and bone mineralization. Evaluation for dislocation is limited without orthogonal imaging. Complete series as clinically appropriate. IMPRESSION: No acute osseous abnormality on this single limited view. Electronically Signed   By: Karen Kays M.D.   On: 11/02/2022 15:21   CT Cervical Spine Wo Contrast  Result Date: 11/02/2022 CLINICAL DATA:  Fall with trauma to the head and neck. EXAM: CT CERVICAL SPINE WITHOUT CONTRAST TECHNIQUE: Multidetector CT imaging of the cervical spine was performed without intravenous contrast. Multiplanar CT image reconstructions were also generated. RADIATION DOSE REDUCTION: This exam was performed according to the departmental dose-optimization program which includes automated exposure control, adjustment of the mA and/or kV according to patient size and/or use of  iterative reconstruction technique. COMPARISON:  11/15/2021 FINDINGS: Alignment: 2 mm degenerative anterolisthesis C3-4 and C4-5. Skull base and vertebrae: No regional fracture. Soft tissues and spinal canal: No traumatic finding. Disc levels: Mild disc space narrowing at C4-5. Advanced facet arthropathy on the left at C3-4 and C4-5 which could be painful. No sign of central canal stenosis. Upper chest: Mild apical scarring.  No active process. Other: None IMPRESSION: No acute or traumatic finding. Degenerative spondylosis and facet arthropathy as outlined above. Electronically Signed   By: Paulina Fusi M.D.   On: 11/02/2022 13:29   CT Head Wo Contrast  Result Date: 11/02/2022 CLINICAL DATA:  Fall.  Trauma to the head and neck. EXAM: CT HEAD WITHOUT CONTRAST TECHNIQUE: Contiguous axial images were obtained from the base of  the skull through the vertex without intravenous contrast. RADIATION DOSE REDUCTION: This exam was performed according to the departmental dose-optimization program which includes automated exposure control, adjustment of the mA and/or kV according to patient size and/or use of iterative reconstruction technique. COMPARISON:  11/15/2021 FINDINGS: Brain: Generalized volume loss. Advanced chronic small-vessel ischemic changes of the white matter. No sign of acute infarction, mass lesion, hemorrhage, hydrocephalus or extra-axial collection. Vascular: There is atherosclerotic calcification of the major vessels at the base of the brain. Skull: Negative Sinuses/Orbits: Clear/normal Other: None IMPRESSION: No acute or traumatic finding. Generalized volume loss. Advanced chronic small-vessel ischemic changes of the white matter. Electronically Signed   By: Paulina Fusi M.D.   On: 11/02/2022 13:26   DG Chest Portable 1 View  Result Date: 11/02/2022 CLINICAL DATA:  Fall EXAM: PORTABLE CHEST 1 VIEW COMPARISON:  11/15/2021 FINDINGS: Mild cardiac enlargement. Aortic atherosclerotic calcification. The  lungs are clear. No edema, infiltrate, collapse or effusion. No acute bone finding. IMPRESSION: No active disease. Mild cardiac enlargement. Aortic atherosclerosis. Electronically Signed   By: Paulina Fusi M.D.   On: 11/02/2022 13:25    Procedures Procedures    Medications Ordered in ED Medications  lidocaine (LIDODERM) 5 % 1 patch (1 patch Transdermal Patch Applied 11/02/22 1337)  acetaminophen (TYLENOL) tablet 650 mg (650 mg Oral Given 11/02/22 1336)  sodium chloride 0.9 % bolus 500 mL (500 mLs Intravenous Bolus 11/02/22 1509)  rOPINIRole (REQUIP) tablet 1 mg (1 mg Oral Given 11/02/22 1510)    ED Course/ Medical Decision Making/ A&P Clinical Course as of 11/02/22 1712  Wed Nov 02, 2022  1359 CT Head Wo Contrast IMPRESSION: No acute or traumatic finding. Generalized volume loss. Advanced chronic small-vessel ischemic changes of the white matter.   [TY]  1359 CT Cervical Spine Wo Contrast MPRESSION: No acute or traumatic finding. Degenerative spondylosis and facet arthropathy as outlined above.   [TY]  1359 DG Chest Portable 1 View IMPRESSION: No active disease. Mild cardiac enlargement. Aortic atherosclerosis.   [TY]  1411 Son and caregiver at bedside.  Report patient is at baseline mentation.  They do note that she has been complaining of left shoulder pain seemingly acute on chronic at this point.  Neurovascular intact in upper extremities.  Will get x-ray to evaluate. [TY]  1411 Creatinine(!): 1.22 Minor elevation will give small fluid bolus as clinically dry  [TY]  1525 Stable HO  from TY 69 YOF with a chief complaint of fall. Is describing chest pain at this time. Seen by cardiology recently. Initial troponin up 6 from a year ago. Serial pending. [CC]    Clinical Course User Index [CC] Glyn Ade, MD [TY] Coral Spikes, DO                                 Medical Decision Making This is a 89 year old female history of CAD, hypertension, strokes, chronic pain  presenting emergency department after mechanical fall.  Physical exam largely reassuring with no overt signs of trauma.  Neurovascularly intact.  CT head and C-spine without acute pathology.  She did complain of some chest discomfort after arrival to the emergency department.  She is quite vague about what the chest pain was.  However, it has resolved.  Chest x-ray without pneumothorax or pneumonia.  She has no tenderness on exam.  Her initial troponin slightly elevated at 19, however no ST segment changes on EKG to indicate  ischemia.  No significant metabolic derangements.  Mild elevation in creatinine, small fluid bolus given.  She has no anemia or leukocytosis.  Patient treated supportively with Tylenol and lidocaine patch.  Care signed out to afternoon team; disposition pending repeat troponin and shared decision making with family regarding admission.  Amount and/or Complexity of Data Reviewed Independent Historian:     Details: Son at bedside and caregiver at bedside noting patient having some shoulder pain this is seemingly a subacute/chronic issue likely exacerbated by fall today.  X-ray negative. External Data Reviewed: notes.    Details: Resides at assisted living facility per chart review. Labs: ordered. Decision-making details documented in ED Course. Radiology: ordered. Decision-making details documented in ED Course. ECG/medicine tests: ordered.  Risk OTC drugs. Prescription drug management.         Final Clinical Impression(s) / ED Diagnoses Final diagnoses:  None    Rx / DC Orders ED Discharge Orders     None         Coral Spikes, DO 11/02/22 1712

## 2022-11-02 NOTE — ED Provider Notes (Signed)
Care of patient received from prior provider at 6:36 PM, please see their note for complete H/P and care plan.  Received handoff per ED course.  Clinical Course as of 11/02/22 1836  Wed Nov 02, 2022  1359 CT Head Wo Contrast IMPRESSION: No acute or traumatic finding. Generalized volume loss. Advanced chronic small-vessel ischemic changes of the white matter.   [TY]  1359 CT Cervical Spine Wo Contrast MPRESSION: No acute or traumatic finding. Degenerative spondylosis and facet arthropathy as outlined above.   [TY]  1359 DG Chest Portable 1 View IMPRESSION: No active disease. Mild cardiac enlargement. Aortic atherosclerosis.   [TY]  1411 Son and caregiver at bedside.  Report patient is at baseline mentation.  They do note that she has been complaining of left shoulder pain seemingly acute on chronic at this point.  Neurovascular intact in upper extremities.  Will get x-ray to evaluate. [TY]  1411 Creatinine(!): 1.22 Minor elevation will give small fluid bolus as clinically dry  [TY]  1525 Stable HO  from TY 71 YOF with a chief complaint of fall. Is describing chest pain at this time. Seen by cardiology recently. Initial troponin up 6 from a year ago. Serial pending. [CC]    Clinical Course User Index [CC] Glyn Ade, MD [TY] Coral Spikes, DO    Reassessment: Long conversation with son at bedside.  Troponin has climbed from 18-28.  Positive delta.  Chest pain has resolved per son.  Discussed with them.  Could consider her for observation given her age, DNR status, of wishes to be discharged, I believe patient can be safely monitored in the outpatient setting.  This might be an end-of-life event if it truly is a developing NSTEMI though her presentation seems more likely with demand from a fall.  Regardless, patient would not wish for intervention and is wishing for discharge she stated understanding that she should return if her symptoms worsen or chest pain recurs tonight.   Patient understanding risk of discharge at this time with strict return precautions reinforced.Glyn Ade, MD 11/02/22 [phone]

## 2022-11-02 NOTE — ED Triage Notes (Signed)
Face forward on concrete floor trying to reach for the phone and lost balance.  Alert and oriented c/o neck and shoulder pain denies LOC

## 2022-11-08 ENCOUNTER — Telehealth: Payer: Self-pay

## 2022-11-08 NOTE — Telephone Encounter (Signed)
Transition Care Management Follow-up Telephone Call Date of discharge and from where: [facility]  How have you been since you were released from the hospital? Doing fine  Any questions or concerns? No  Items Reviewed: Did the pt receive and understand the discharge instructions provided? Yes  Medications obtained and verified? No  Other? No  Any new allergies since your discharge? No  Dietary orders reviewed? No Do you have support at home? Yes     Follow up appointments reviewed:  PCP Hospital f/u appt confirmed? No  Scheduled to see  on  @ . Specialist Hospital f/u appt confirmed? No  Scheduled to see  on  @ . Are transportation arrangements needed? No  If their condition worsens, is the pt aware to call PCP or go to the Emergency Dept.? Yes Was the patient provided with contact information for the PCP's office or ED? Yes Was to pt encouraged to call back with questions or concerns? Yes

## 2022-11-16 ENCOUNTER — Other Ambulatory Visit: Payer: Self-pay

## 2022-11-16 ENCOUNTER — Observation Stay (HOSPITAL_COMMUNITY)
Admission: EM | Admit: 2022-11-16 | Discharge: 2022-11-18 | Disposition: A | Discharge status: Home / Self Care | Payer: Medicare Other | Attending: Internal Medicine | Admitting: Internal Medicine

## 2022-11-16 ENCOUNTER — Emergency Department (HOSPITAL_COMMUNITY): Payer: Medicare Other

## 2022-11-16 ENCOUNTER — Encounter (HOSPITAL_COMMUNITY): Payer: Self-pay

## 2022-11-16 DIAGNOSIS — E7800 Pure hypercholesterolemia, unspecified: Secondary | ICD-10-CM

## 2022-11-16 DIAGNOSIS — G2581 Restless legs syndrome: Secondary | ICD-10-CM | POA: Diagnosis not present

## 2022-11-16 DIAGNOSIS — I700 Atherosclerosis of aorta: Secondary | ICD-10-CM | POA: Diagnosis not present

## 2022-11-16 DIAGNOSIS — Z7902 Long term (current) use of antithrombotics/antiplatelets: Secondary | ICD-10-CM | POA: Diagnosis not present

## 2022-11-16 DIAGNOSIS — Z743 Need for continuous supervision: Secondary | ICD-10-CM | POA: Diagnosis not present

## 2022-11-16 DIAGNOSIS — M4316 Spondylolisthesis, lumbar region: Secondary | ICD-10-CM | POA: Diagnosis not present

## 2022-11-16 DIAGNOSIS — R079 Chest pain, unspecified: Secondary | ICD-10-CM | POA: Diagnosis not present

## 2022-11-16 DIAGNOSIS — M549 Dorsalgia, unspecified: Secondary | ICD-10-CM | POA: Diagnosis not present

## 2022-11-16 DIAGNOSIS — M25559 Pain in unspecified hip: Secondary | ICD-10-CM | POA: Diagnosis not present

## 2022-11-16 DIAGNOSIS — Z8673 Personal history of transient ischemic attack (TIA), and cerebral infarction without residual deficits: Secondary | ICD-10-CM | POA: Diagnosis not present

## 2022-11-16 DIAGNOSIS — R2689 Other abnormalities of gait and mobility: Secondary | ICD-10-CM | POA: Diagnosis not present

## 2022-11-16 DIAGNOSIS — S3992XA Unspecified injury of lower back, initial encounter: Secondary | ICD-10-CM | POA: Diagnosis not present

## 2022-11-16 DIAGNOSIS — I491 Atrial premature depolarization: Secondary | ICD-10-CM | POA: Diagnosis not present

## 2022-11-16 DIAGNOSIS — M48061 Spinal stenosis, lumbar region without neurogenic claudication: Secondary | ICD-10-CM | POA: Diagnosis not present

## 2022-11-16 DIAGNOSIS — R6889 Other general symptoms and signs: Secondary | ICD-10-CM | POA: Diagnosis not present

## 2022-11-16 DIAGNOSIS — R55 Syncope and collapse: Principal | ICD-10-CM | POA: Diagnosis present

## 2022-11-16 DIAGNOSIS — I119 Hypertensive heart disease without heart failure: Secondary | ICD-10-CM | POA: Diagnosis not present

## 2022-11-16 DIAGNOSIS — I10 Essential (primary) hypertension: Secondary | ICD-10-CM | POA: Diagnosis present

## 2022-11-16 DIAGNOSIS — R404 Transient alteration of awareness: Secondary | ICD-10-CM | POA: Diagnosis not present

## 2022-11-16 DIAGNOSIS — Z79899 Other long term (current) drug therapy: Secondary | ICD-10-CM | POA: Insufficient documentation

## 2022-11-16 DIAGNOSIS — S199XXA Unspecified injury of neck, initial encounter: Secondary | ICD-10-CM | POA: Diagnosis not present

## 2022-11-16 DIAGNOSIS — M8588 Other specified disorders of bone density and structure, other site: Secondary | ICD-10-CM | POA: Diagnosis not present

## 2022-11-16 DIAGNOSIS — R778 Other specified abnormalities of plasma proteins: Secondary | ICD-10-CM | POA: Diagnosis not present

## 2022-11-16 DIAGNOSIS — I517 Cardiomegaly: Secondary | ICD-10-CM | POA: Diagnosis not present

## 2022-11-16 DIAGNOSIS — I499 Cardiac arrhythmia, unspecified: Secondary | ICD-10-CM | POA: Diagnosis not present

## 2022-11-16 DIAGNOSIS — R413 Other amnesia: Secondary | ICD-10-CM | POA: Diagnosis present

## 2022-11-16 DIAGNOSIS — R7989 Other specified abnormal findings of blood chemistry: Secondary | ICD-10-CM | POA: Insufficient documentation

## 2022-11-16 DIAGNOSIS — S0990XA Unspecified injury of head, initial encounter: Secondary | ICD-10-CM | POA: Diagnosis not present

## 2022-11-16 DIAGNOSIS — I6782 Cerebral ischemia: Secondary | ICD-10-CM | POA: Diagnosis not present

## 2022-11-16 DIAGNOSIS — E785 Hyperlipidemia, unspecified: Secondary | ICD-10-CM | POA: Diagnosis present

## 2022-11-16 LAB — BASIC METABOLIC PANEL
Anion gap: 12 (ref 5–15)
BUN: 26 mg/dL — ABNORMAL HIGH (ref 8–23)
CO2: 23 mmol/L (ref 22–32)
Calcium: 9.7 mg/dL (ref 8.9–10.3)
Chloride: 102 mmol/L (ref 98–111)
Creatinine, Ser: 1.2 mg/dL — ABNORMAL HIGH (ref 0.44–1.00)
GFR, Estimated: 41 mL/min — ABNORMAL LOW (ref >60)
Glucose, Bld: 100 mg/dL — ABNORMAL HIGH (ref 70–99)
Potassium: 4.2 mmol/L (ref 3.5–5.1)
Sodium: 137 mmol/L (ref 135–145)

## 2022-11-16 LAB — HEPATIC FUNCTION PANEL
ALT: 27 U/L (ref 0–44)
AST: 34 U/L (ref 15–41)
Albumin: 3.6 g/dL (ref 3.5–5.0)
Alkaline Phosphatase: 47 U/L (ref 38–126)
Bilirubin, Direct: <0.1 mg/dL (ref 0.0–0.2)
Total Bilirubin: 0.4 mg/dL (ref 0.3–1.2)
Total Protein: 6.4 g/dL — ABNORMAL LOW (ref 6.5–8.1)

## 2022-11-16 LAB — CBC
HCT: 39.9 % (ref 36.0–46.0)
Hemoglobin: 13.3 g/dL (ref 12.0–15.0)
MCH: 30.2 pg (ref 26.0–34.0)
MCHC: 33.3 g/dL (ref 30.0–36.0)
MCV: 90.5 fL (ref 80.0–100.0)
Platelets: 192 10*3/uL (ref 150–400)
RBC: 4.41 MIL/uL (ref 3.87–5.11)
RDW: 14.2 % (ref 11.5–15.5)
WBC: 6.9 10*3/uL (ref 4.0–10.5)
nRBC: 0 % (ref 0.0–0.2)

## 2022-11-16 LAB — TROPONIN I (HIGH SENSITIVITY)
Troponin I (High Sensitivity): 36 ng/L — ABNORMAL HIGH (ref <18)
Troponin I (High Sensitivity): 151 ng/L (ref <18)
Troponin I (High Sensitivity): 117 ng/L (ref <18)
Troponin I (High Sensitivity): 101 ng/L (ref <18)

## 2022-11-16 MED ORDER — ACETAMINOPHEN 650 MG RE SUPP: 650.0000 mg | Freq: Four times a day (QID) | RECTAL | Status: DC | PRN

## 2022-11-16 MED ORDER — SODIUM CHLORIDE 0.9% FLUSH
3.0000 mL | Freq: Two times a day (BID) | INTRAVENOUS | Status: DC
  Administered 2022-11-16 – 2022-11-18 (×5): 3 mL via INTRAVENOUS

## 2022-11-16 MED ORDER — DONEPEZIL HCL 10 MG PO TABS
10.0000 mg | ORAL_TABLET | Freq: Every day | ORAL | Status: DC
  Administered 2022-11-16 – 2022-11-17 (×2): 10 mg via ORAL
  Filled 2022-11-16 (×2): qty 1

## 2022-11-16 MED ORDER — ACETAMINOPHEN 325 MG PO TABS
650.0000 mg | ORAL_TABLET | Freq: Four times a day (QID) | ORAL | Status: DC | PRN
  Administered 2022-11-16 – 2022-11-18 (×4): 650 mg via ORAL
  Filled 2022-11-16 (×4): qty 2

## 2022-11-16 MED ORDER — HYDROCHLOROTHIAZIDE 12.5 MG PO TABS
12.5000 mg | ORAL_TABLET | Freq: Every day | ORAL | Status: DC
  Administered 2022-11-17 – 2022-11-18 (×2): 12.5 mg via ORAL
  Filled 2022-11-16 (×2): qty 1

## 2022-11-16 MED ORDER — ROPINIROLE HCL 1 MG PO TABS
2.0000 mg | ORAL_TABLET | Freq: Two times a day (BID) | ORAL | Status: DC
  Administered 2022-11-16 – 2022-11-18 (×4): 2 mg via ORAL
  Filled 2022-11-16 (×3): qty 2
  Filled 2022-11-16: qty 4
  Filled 2022-11-16 (×2): qty 2
  Filled 2022-11-16 (×2): qty 4

## 2022-11-16 MED ORDER — ASPIRIN 325 MG PO TABS
325.0000 mg | ORAL_TABLET | Freq: Every day | ORAL | Status: DC
  Administered 2022-11-16: 325 mg via ORAL
  Filled 2022-11-16 (×2): qty 1

## 2022-11-16 MED ORDER — ATORVASTATIN CALCIUM 10 MG PO TABS
10.0000 mg | ORAL_TABLET | Freq: Every day | ORAL | Status: DC
  Administered 2022-11-17 – 2022-11-18 (×2): 10 mg via ORAL
  Filled 2022-11-16 (×2): qty 1

## 2022-11-16 MED ORDER — POLYETHYLENE GLYCOL 3350 17 G PO PACK: 17.0000 g | PACK | Freq: Every day | ORAL | Status: DC | PRN

## 2022-11-16 MED ORDER — ACETAMINOPHEN 325 MG PO TABS
650.0000 mg | ORAL_TABLET | Freq: Four times a day (QID) | ORAL | Status: DC | PRN
  Administered 2022-11-16: 650 mg via ORAL
  Filled 2022-11-16: qty 2

## 2022-11-16 MED ORDER — SODIUM CHLORIDE 0.9 % IV BOLUS
500.0000 mL | Freq: Once | INTRAVENOUS | Status: AC
  Administered 2022-11-16: 500 mL via INTRAVENOUS

## 2022-11-16 MED ORDER — ENOXAPARIN SODIUM 30 MG/0.3ML IJ SOSY
30.0000 mg | PREFILLED_SYRINGE | INTRAMUSCULAR | Status: DC
  Administered 2022-11-16 – 2022-11-17 (×2): 30 mg via SUBCUTANEOUS
  Filled 2022-11-16 (×2): qty 0.3

## 2022-11-16 NOTE — Progress Notes (Signed)
Orthopedic Tech Progress Note Patient Details:  [name] [birth year] [phone]  Level 2 trauma   Patient ID: Valerie Barker, female   DOB: 08-Feb-1923, 89 y.o.   MRN: [id number]  Donald Pore 11/16/2022, 8:57 AM

## 2022-11-16 NOTE — H&P (Signed)
History and Physical   Valerie Barker ZOX:[id number] DOB: [birth year] DOA: 11/16/2022  PCP: Merri Brunette, MD   Patient coming from: Independent living at [facility]  Chief Complaint: Fall, ?Syncope  HPI: Valerie Barker is a 89 y.o. female with medical history significant of memory impairment, hypertension, hyperlipidemia, stroke, RLS, IBS, incontinence presenting with fall.  Patient resides at a facility and had an unwitnessed fall earlier today.  Due to her memory difficulties she is unclear of the events surrounding the fall.  She has had some intermittent lightheadedness for months as well as some intermittent chest pain for several months.  Not sure if lightheaded today. Reports she fell shortly after getting out of bed this morning; Losing balance with possible light headedness. Does not believe she passed out.  Was seen in the ED 11/02/2022 with some chest pain and fall from losing balance.  Had mild delta troponin from 18-28.  Observation considered but given her age, DNR status, and desire to be discharged she was discharged for outpatient monitoring.  At that time a discussion was had the patient would not wish for invasive intervention, return precautions given.  She denies fevers, chills, shortness of breath, abdominal pain, constipation, diarrhea, nausea, vomiting.  ED Course: Vital signs in the ED notable for initial heart rate read in the 120s but since then has been in the 50s to 70s.  Blood pressure in the 120s to 130s systolic.  Respiratory rate in the 20s to 30s.  Lab workup included BMP with creatinine mildly elevated 1.2 from baseline of 1.  CBC within normal limits.  Troponin uptrending from 36-1 51.  Imaging studies included chest x-ray with cardiomegaly and chronic interstitial lung changes.  Pelvis x-ray showed no acute normality.  CT head showed no acute abnormality.  CT C-spine showed no acute normality but did show degenerative disease.  CT L-spine showed no  acute abnormality but did show moderate stenosis at L4-L5.  Patient received Tylenol and aspirin in the ED.  Cardiology was consulted given possible syncope and unusual ECG with supraventricular bigeminy.  Review of Systems: As per HPI otherwise all other systems reviewed and are negative.  Past Medical History:  Diagnosis Date   Heart disease    Hypertension    IBS (irritable bowel syndrome)    Stroke (HCC) 04/17/2018   around 2011   TIA (transient ischemic attack)     Past Surgical History:  Procedure Laterality Date   ABDOMINAL HYSTERECTOMY  1978   BUNIONECTOMY  2006, 1996   LAPAROSCOPIC SALPINGO OOPHERECTOMY  1988    Social History  reports that she has never smoked. She has never used smokeless tobacco. She reports that she does not drink alcohol and does not use drugs.  Allergies  Allergen Reactions   Duloxetine Other (See Comments)    unknown   Erythromycin    Mirtazapine     Other reaction(s): Other (See Comments) unknown   Trazodone     Other reaction(s): Other (See Comments) unknown   Zolpidem     Other reaction(s): Other (See Comments) unknown   Cyclobenzaprine Anxiety    Unknown   Diazepam Rash    unknown    Family History  Problem Relation Age of Onset   COPD Mother    Schizophrenia Brother    Stroke Father    Breast cancer Neg Hx   Reviewed on admission  Prior to Admission medications   Medication Sig Start Date End Date Taking? Authorizing Provider  atorvastatin (LIPITOR)  10 MG tablet TAKE 1/2 TABLET EVERY EVENING Patient taking differently: Take 10 mg by mouth daily. 12/20/21   Lennette Bihari, MD  Biotin 16109 MCG TABS Take 10,000 mcg by mouth every evening.     [provider]  Calcium Carbonate-Vitamin D (CALCIUM-D PO) Take 1 tablet by mouth every evening. 1200/500    [provider]  Cholecalciferol (VITAMIN D) 2000 UNITS CAPS Take 2,000 Units by mouth every morning.     [provider]  clopidogrel (PLAVIX) 75  MG tablet Take 75 mg by mouth daily.    [provider]  Cyanocobalamin (VITAMIN B-12 PO) Take 2,500 mcg by mouth every morning.     [provider]  donepezil (ARICEPT) 10 MG tablet Take 10 mg by mouth at bedtime. 10/05/22   [provider]  hydrochlorothiazide (HYDRODIURIL) 12.5 MG tablet TAKE 1 OR 2 TABLETS BY MOUTH EVERY DAY Patient taking differently: Take 12.5 mg by mouth See admin instructions. Take 1 tablet daily, alternating with 2 tablets daily 09/19/22   Lennette Bihari, MD  Multiple Vitamin (MULTIVITAMIN) tablet Take 1 tablet by mouth daily.    [provider]  olmesartan (BENICAR) 20 MG tablet Take 1 tablet (20 mg total) by mouth daily. Patient not taking: Reported on 11/02/2022 06/24/20   Lennette Bihari, MD  rOPINIRole (REQUIP) 1 MG tablet Take 1 mg by mouth See admin instructions. 2 tablets in the afternoon, 2 tablets in the evening. 05/26/18   [provider]  trimethoprim (TRIMPEX) 100 MG tablet Take 100 mg by mouth every morning. 09/27/22   [provider]    Physical Exam: Vitals:   11/16/22 1030 11/16/22 1130 11/16/22 1245 11/16/22 1253  BP: (!) 159/68 136/60 (!) 131/55   Pulse: 75 67 (!) 56   Resp: (!) 22 (!) 21 (!) 25   Temp:    98.3 F (36.8 C)  TempSrc:    Oral  SpO2: 100% 98% 98%   Weight:      Height:        Physical Exam Constitutional:      General: She is not in acute distress.    Appearance: Normal appearance.  HENT:     Head: Normocephalic and atraumatic.     Mouth/Throat:     Mouth: Mucous membranes are moist.     Pharynx: Oropharynx is clear.  Eyes:     Extraocular Movements: Extraocular movements intact.     Pupils: Pupils are equal, round, and reactive to light.  Cardiovascular:     Rate and Rhythm: Normal rate. Rhythm irregular.     Pulses: Normal pulses.     Heart sounds: Normal heart sounds.  Pulmonary:     Effort: Pulmonary effort is normal. No respiratory distress.     Breath sounds:  Normal breath sounds.  Abdominal:     General: Bowel sounds are normal. There is no distension.     Palpations: Abdomen is soft.     Tenderness: There is no abdominal tenderness.  Musculoskeletal:        General: No swelling or deformity.  Skin:    General: Skin is warm and dry.  Neurological:     General: No focal deficit present.     Mental Status: Mental status is at baseline.    Labs on Admission: I have personally reviewed following labs and imaging studies  CBC: Recent Labs  Lab 11/16/22 0850  WBC 6.9  HGB 13.3  HCT 39.9  MCV 90.5  PLT  192    Basic Metabolic Panel: Recent Labs  Lab 11/16/22 0850  NA 137  K 4.2  CL 102  CO2 23  GLUCOSE 100*  BUN 26*  CREATININE 1.20*  CALCIUM 9.7    GFR: Estimated Creatinine Clearance: 20.6 mL/min (A) (by C-G formula based on SCr of 1.2 mg/dL (H)).  Liver Function Tests: No results for input(s): "AST", "ALT", "ALKPHOS", "BILITOT", "PROT", "ALBUMIN" in the last 168 hours.  Urine analysis:    Component Value Date/Time   COLORURINE YELLOW 04/17/2018 1340   APPEARANCEUR HAZY (A) 04/17/2018 1340   LABSPEC 1.009 04/17/2018 1340   PHURINE 7.0 04/17/2018 1340   GLUCOSEU NEGATIVE 04/17/2018 1340   HGBUR SMALL (A) 04/17/2018 1340   BILIRUBINUR NEGATIVE 04/17/2018 1340   KETONESUR NEGATIVE 04/17/2018 1340   PROTEINUR NEGATIVE 04/17/2018 1340   UROBILINOGEN 0.2 04/27/2009 1643   NITRITE NEGATIVE 04/17/2018 1340   LEUKOCYTESUR TRACE (A) 04/17/2018 1340    Radiological Exams on Admission: DG Pelvis 1-2 Views  Result Date: 11/16/2022 CLINICAL DATA:  Pain after fall EXAM: PELVIS - 1 VIEW COMPARISON:  11/15/2021 FINDINGS: Osteopenia there is some concentric joint space loss of both hips. Mild degenerative changes of the sacroiliac joints. No fracture or dislocation. Hyperostosis. Overlapping cardiac leads. Diffuse colonic stool. Presumed vascular calcifications in the pelvis. If there is further concern of subtle nondisplaced  injury, additional cross-sectional imaging study may be useful in the setting of osteopenia for higher sensitivity IMPRESSION: Osteopenia.  Degenerative changes. Electronically Signed   By: Karen Kays M.D.   On: 11/16/2022 10:05   DG Chest Portable 1 View  Result Date: 11/16/2022 CLINICAL DATA:  Pain after fall EXAM: PORTABLE CHEST 1 VIEW COMPARISON:  X-ray 11/02/2022 FINDINGS: Stable borderline cardiopericardial silhouette. Calcified aorta. Chronic interstitial changes are again seen. No pneumothorax, effusion or edema. No consolidation. Overlapping cardiac leads. Calcific tendinitis along the right shoulder seen at the edge of the imaging field. The right inferior costophrenic angle is clipped off the edge of the film. IMPRESSION: Enlarged heart.  Chronic interstitial lung changes. Electronically Signed   By: Karen Kays M.D.   On: 11/16/2022 10:03   CT Lumbar Spine Wo Contrast  Result Date: 11/16/2022 CLINICAL DATA:  Back trauma, no prior imaging (Age >= 16y) EXAM: CT LUMBAR SPINE WITHOUT CONTRAST TECHNIQUE: Multidetector CT imaging of the lumbar spine was performed without intravenous contrast administration. Multiplanar CT image reconstructions were also generated. RADIATION DOSE REDUCTION: This exam was performed according to the departmental dose-optimization program which includes automated exposure control, adjustment of the mA and/or kV according to patient size and/or use of iterative reconstruction technique. COMPARISON:  None Available. FINDINGS: Segmentation: 5 lumbar type vertebrae. Alignment: Grade 1 anterolisthesis of L3 on L4 and L4-L5. Vertebrae: No acute fracture or focal pathologic process. Paraspinal and other soft tissues: Aortic atherosclerotic calcifications. Disc levels: There is a least moderate spinal canal narrowing at L4-L5 (series 7, image 93). IMPRESSION: 1. No acute fracture or traumatic listhesis. 2. At least moderate spinal canal narrowing at L4-L5. Aortic  Atherosclerosis (ICD10-I70.0). Electronically Signed   By: Lorenza Cambridge M.D.   On: 11/16/2022 09:51   CT Head Wo Contrast  Result Date: 11/16/2022 CLINICAL DATA:  Head trauma, minor (Age >= 65y); Neck trauma (Age >= 65y) EXAM: CT HEAD WITHOUT CONTRAST CT CERVICAL SPINE WITHOUT CONTRAST TECHNIQUE: Multidetector CT imaging of the head and cervical spine was performed following the standard protocol without intravenous contrast. Multiplanar CT image reconstructions of the cervical spine were also  generated. RADIATION DOSE REDUCTION: This exam was performed according to the departmental dose-optimization program which includes automated exposure control, adjustment of the mA and/or kV according to patient size and/or use of iterative reconstruction technique. COMPARISON:  CT head and C Spine 11/02/22 FINDINGS: CT HEAD FINDINGS Brain: No evidence of acute infarction, hemorrhage, hydrocephalus, extra-axial collection or mass lesion/mass effect. There is sequela moderate to severe chronic microvascular ischemic change. Vascular: No hyperdense vessel or unexpected calcification. Skull: Normal. Negative for fracture or focal lesion. Sinuses/Orbits: No middle ear or mastoid effusion. Paranasal sinuses are notable for mild mucosal thickening in the left sphenoid sinus. Bilateral lens replacement. Orbits are otherwise unremarkable. Other: None. CT CERVICAL SPINE FINDINGS Alignment: Grade 1 anterolisthesis of C3 on C4 and C4 on C5. Skull base and vertebrae: No acute fracture. No primary bone lesion or focal pathologic process. Severe degenerative changes of the left-sided facet joint at C3-C4 (series 9, image 40). Soft tissues and spinal canal: No prevertebral fluid or swelling. No visible canal hematoma. Disc levels:  No CT evidence of high-grade spinal canal stenosis. Upper chest: Negative. Other: No IMPRESSION: 1. No acute intracranial abnormality. 2. No acute fracture or traumatic subluxation of the cervical spine. 3.  Severe degenerative changes of the left-sided facet joint at C3-C4. Electronically Signed   By: Lorenza Cambridge M.D.   On: 11/16/2022 09:40   CT Cervical Spine Wo Contrast  Result Date: 11/16/2022 CLINICAL DATA:  Head trauma, minor (Age >= 65y); Neck trauma (Age >= 65y) EXAM: CT HEAD WITHOUT CONTRAST CT CERVICAL SPINE WITHOUT CONTRAST TECHNIQUE: Multidetector CT imaging of the head and cervical spine was performed following the standard protocol without intravenous contrast. Multiplanar CT image reconstructions of the cervical spine were also generated. RADIATION DOSE REDUCTION: This exam was performed according to the departmental dose-optimization program which includes automated exposure control, adjustment of the mA and/or kV according to patient size and/or use of iterative reconstruction technique. COMPARISON:  CT head and C Spine 11/02/22 FINDINGS: CT HEAD FINDINGS Brain: No evidence of acute infarction, hemorrhage, hydrocephalus, extra-axial collection or mass lesion/mass effect. There is sequela moderate to severe chronic microvascular ischemic change. Vascular: No hyperdense vessel or unexpected calcification. Skull: Normal. Negative for fracture or focal lesion. Sinuses/Orbits: No middle ear or mastoid effusion. Paranasal sinuses are notable for mild mucosal thickening in the left sphenoid sinus. Bilateral lens replacement. Orbits are otherwise unremarkable. Other: None. CT CERVICAL SPINE FINDINGS Alignment: Grade 1 anterolisthesis of C3 on C4 and C4 on C5. Skull base and vertebrae: No acute fracture. No primary bone lesion or focal pathologic process. Severe degenerative changes of the left-sided facet joint at C3-C4 (series 9, image 40). Soft tissues and spinal canal: No prevertebral fluid or swelling. No visible canal hematoma. Disc levels:  No CT evidence of high-grade spinal canal stenosis. Upper chest: Negative. Other: No IMPRESSION: 1. No acute intracranial abnormality. 2. No acute fracture or  traumatic subluxation of the cervical spine. 3. Severe degenerative changes of the left-sided facet joint at C3-C4. Electronically Signed   By: Lorenza Cambridge M.D.   On: 11/16/2022 09:40    EKG: Independently reviewed.  Supraventricular bigeminy at 93 bpm.  Assessment/Plan Principal Problem:   Syncope and collapse Active Problems:   HTN (hypertension)   Restless leg syndrome   Hyperlipidemia   History of CVA (cerebrovascular accident)   Mixed incontinence   OAB (overactive bladder)   Memory difficulty   Fall Syncope > Unwitnessed fall though concern for syncope.  Unclear of the  events surrounding the fall.  Does walk with a walker at baseline. > Has had intermittent chest pain and lightheadedness for several months. > Noted to have mildly elevated creatinine as below but no AKI.  Also noted to have troponin uptrending as below 36-151.  ECG with supraventricular bigeminy.  Unclear if there could be an arrhythmogenic syncope which led to her fall. - Monitor on telemetry overnight - Appreciate cardiology input on arrhythmia as below - Orthostatic vital signs - Echocardiogram - PT/OT eval and treat - Trend renal function and electrolytes - Supportive care  Elevated troponin > Current suspicion is for demand ischemia.  Does have some intermittent episodes of lightheadedness and chest pain for the last several months. > Could be elevated due to arrhythmia given ECG changes as below.  Cardiology consulted in the ED. - On telemetry as above - Trend troponin - Appreciate cardiology recommendations  Elevated creatinine > Creatinine elevated to 1.2 from baseline of around 1.  Not true AKI but possibly some dehydration which could be contributing to his symptoms if there was a syncopal event. - 500 cc IV fluid bolus - Trend renal function and electrolytes  Abnormal ECG > ECG noted to have supraventricular bigeminy and 93 bpm.  Cardiology consulted in the ED. - Appreciate cardiology  recommendations  Hypertension - Continue home hydrochlorothiazide  Hyperlipidemia - Continue home atorvastatin  Memory impairment - Continue home donepezil  History of CVA - Continue atorvastatin - Working with pharmacy to determine if still taking Plavix  RLS - Continue home ropinirole  DVT prophylaxis: Lovenox Code Status:   DNR/DNI Family Communication:  Son, updated by phone.  Disposition Plan:   Patient is from:  Independent living at [facility]  Anticipated DC to:  Same as above  Anticipated DC date:  1 to 2 days  Anticipated DC barriers: None  Consults called:  Cardiology consulted in the ED Admission status:  Observation, telemetry  Severity of Illness: The appropriate patient status for this patient is OBSERVATION. Observation status is judged to be reasonable and necessary in order to provide the required intensity of service to ensure the patient's safety. The patient's presenting symptoms, physical exam findings, and initial radiographic and laboratory data in the context of their medical condition is felt to place them at decreased risk for further clinical deterioration. Furthermore, it is anticipated that the patient will be medically stable for discharge from the hospital within 2 midnights of admission.    Synetta Fail MD [facility]  How to contact the [facility] Attending or Consulting provider 7A - 7P or covering provider during after hours 7P -7A, for this patient?   Check the care team in [facility] and look for a) attending/consulting [facility] provider listed and b) the [facility] team listed Log into [email or web address] and use [place]'s universal password to access. If you do not have the password, please contact the hospital operator. Locate the [facility] provider you are looking for under [facility] and page to a number that you can be directly reached. If you still have difficulty reaching the provider, please page the [facility] (Director on Call) for the  Hospitalists listed on amion for assistance.  11/16/2022, 1:03 PM

## 2022-11-16 NOTE — Consult Note (Addendum)
Cardiology Consultation   Patient ID: Valerie Barker MRN: [id number]; DOB: July 21, 1922  Admit date: 11/16/2022 Date of Consult: 11/16/2022  PCP:  Merri Brunette, MD   [facility] Providers Cardiologist:  Nicki Guadalajara, MD   {   Patient Profile:   Valerie Barker is a 89 y.o. female with a hx of TIA/lacunar infarcts, hypertension, hyperlipidemia, frequent PACs, bradycardia (no beta-blocker), moderate TR who is being seen 11/16/2022 for the evaluation of syncope/elevated troponins at the request of Dr. Alinda Money.  History of Present Illness:   [name] is followed by Dr. Tresa Endo and most recently seen October 25, 2022.  She has history of TIAs dating back to 2014 however no evidence of acute infarctions.  She has been chronically on Plavix and aspirin has been discontinued due to frequent issues with falling.  She has had normal EF throughout the years with some valvular disease noted above.  Also has known history of sinus bradycardia so beta-blockers have been discontinued.  Chronically she has had some degree of shortness of breath however ambulates well with mechanical assistance.  She is a very pleasant lady however does have some memory issues and cognitive deficits.  Today she is accompanied with her care taker.  Patient not able to give great report of her events so most of the information has been taken from son who was called.  Today she has been admitted due to an unwitnessed fall.  She states that she was getting up to go to the restroom and became unbalanced and fell.  Did not have any preceding symptoms or postictal phase.  No muscle weakness.  States that she has had chronic issues with balance.  She has had recent admission as well for fall due to imbalance issues when reaching her walker.  She has no significant complaints right now but has had that she has had some slight dizziness and intermittent chest pain for several months.  The symptoms are very vague and she  is not able to describe her symptoms in depth.  Does not seem to be exertional.  Spoke with son who believes that her chest pain is been more musculoskeletal related.  She has also been complaining of some shortness of breath with long distance ambulation when walking to lunch, per son's report this may have progressed in the past few months however has been chronic.  Cardiology has been asked to see due to elevated troponins and possible syncope however she has had no loss of consciousness and all imaging of her back and head have been negative.  Also has had some PACs/supraventricular bigeminy however this is chronic.  Additionally, patient's caretaker states that she has been taking two 500 mg tablets of Tylenol 3 times a day.  When asked why, patient had whispered that she does not know why she does what she does.  She is also been taking aspirin intermittently.  Unknown amounts.  Not supposed to be on this due to risk of falls.  Normal sinus rhythm, heart rate 93.  Supraventricular bigeminy.  Frequent PACs.  Troponins 36-151   Past Medical History:  Diagnosis Date   Acute urinary retention 04/17/2018   Heart disease    Hypertension    IBS (irritable bowel syndrome)    Stroke (HCC) 04/17/2018   around 2011   TIA (transient ischemic attack)     Past Surgical History:  Procedure Laterality Date   ABDOMINAL HYSTERECTOMY  1978   BUNIONECTOMY  2006, 1996   LAPAROSCOPIC  SALPINGO OOPHERECTOMY  1988     Inpatient Medications: Scheduled Meds:  aspirin  325 mg Oral Daily   [START ON 11/17/2022] atorvastatin  10 mg Oral Daily   donepezil  10 mg Oral QHS   enoxaparin (LOVENOX) injection  30 mg Subcutaneous Q24H   [START ON 11/17/2022] hydrochlorothiazide  12.5 mg Oral Daily   rOPINIRole  1 mg Oral See admin instructions   sodium chloride flush  3 mL Intravenous Q12H   Continuous Infusions:  PRN Meds: acetaminophen **OR** acetaminophen, polyethylene glycol  Allergies:    Allergies   Allergen Reactions   Duloxetine Other (See Comments)    unknown   Erythromycin    Mirtazapine     Other reaction(s): Other (See Comments) unknown   Trazodone     Other reaction(s): Other (See Comments) unknown   Zolpidem     Other reaction(s): Other (See Comments) unknown   Cyclobenzaprine Anxiety    Unknown   Diazepam Rash    unknown    Social History:   Social History   Socioeconomic History   Marital status: Married    Spouse name: Not on file   Number of children: Not on file   Years of education: Not on file   Highest education level: Not on file  Occupational History   Not on file  Tobacco Use   Smoking status: Never   Smokeless tobacco: Never  Vaping Use   Vaping status: Never Used  Substance and Sexual Activity   Alcohol use: No   Drug use: No   Sexual activity: Not Currently  Other Topics Concern   Not on file  Social History Narrative   Lives in [facility] [place].  Education BA degree.  Retired.  3 children.  Son, Valerie Barker.  Caffeine 1=2 cups daily.   Social Determinants of Health   Financial Resource Strain: Not on file  Food Insecurity: Not on file  Transportation Needs: Not on file  Physical Activity: Not on file  Stress: Not on file  Social Connections: Not on file  Intimate Partner Violence: Not on file    Family History:    Family History  Problem Relation Age of Onset   COPD Mother    Schizophrenia Brother    Stroke Father    Breast cancer Neg Hx      ROS:  Please see the history of present illness.   All other ROS reviewed and negative.     Physical Exam/Data:   Vitals:   11/16/22 1245 11/16/22 1253 11/16/22 1434 11/16/22 1555  BP: (!) 131/55  (!) 121/53   Pulse: (!) 56  (!) 48   Resp: (!) 25  12 18   Temp:  98.3 F (36.8 C) 97.7 F (36.5 C) 97.7 F (36.5 C)  TempSrc:  Oral Oral Oral  SpO2: 98%  100%   Weight:      Height:       No intake or output data in the 24 hours ending 11/16/22 1608     11/16/2022    8:49 AM 11/02/2022   11:44 AM 10/25/2022    1:10 PM  Last 3 Weights  Weight (lbs) 125 lb 130 lb 1.1 oz 129 lb 6.4 oz  Weight (kg) 56.7 kg 59 kg 58.695 kg     Body mass index is 25.25 kg/m.  General:  Well nourished, well developed, in no acute distress HEENT: normal Neck: no JVD Vascular: No carotid bruits; Distal pulses 2+ bilaterally Cardiac:  irregular  Lungs:  clear to auscultation bilaterally, no wheezing, rhonchi or rales  Abd: soft, nontender, no hepatomegaly  Ext: no edema Musculoskeletal:  No deformities, BUE and BLE strength normal and equal Skin: warm and dry  Neuro:  CNs 2-12 intact, no focal abnormalities noted Psych:  Normal affect   EKG:  The EKG was personally reviewed and demonstrates:  Normal sinus rhythm, heart rate 93.  Supraventricular bigeminy.  Frequent PACs.   Telemetry:  Telemetry was personally reviewed and demonstrates:   Rhythm frequent PACs  Relevant CV Studies: Echocardiogram 05/17/2022  1. Left ventricular ejection fraction, by estimation, is 70 to 75%. The  left ventricle has hyperdynamic function. The left ventricle has no  regional wall motion abnormalities. Left ventricular diastolic parameters  are consistent with Grade II diastolic  dysfunction (pseudonormalization). Elevated left ventricular end-diastolic  pressure.   2. Right ventricular systolic function is normal. The right ventricular  size is normal. There is moderately elevated pulmonary artery systolic  pressure.   3. Left atrial size was severely dilated.   4. Right atrial size was mildly dilated.   5. The mitral valve is normal in structure. Mild mitral valve  regurgitation. No evidence of mitral stenosis.   6. Tricuspid valve regurgitation is moderate.   7. The aortic valve is tricuspid. Aortic valve regurgitation is not  visualized. No aortic stenosis is present.   8. Pulmonic valve regurgitation is moderate.   9. The inferior vena cava is normal in size with  greater than 50%  respiratory variability, suggesting right atrial pressure of 3 mmHg.   Laboratory Data:  High Sensitivity Troponin:   Recent Labs  Lab 11/02/22 1153 11/02/22 1613 11/16/22 0850 11/16/22 1050  TROPONINIHS 19* 28* 36* 151*     Chemistry Recent Labs  Lab 11/16/22 0850  NA 137  K 4.2  CL 102  CO2 23  GLUCOSE 100*  BUN 26*  CREATININE 1.20*  CALCIUM 9.7  GFRNONAA 41*  ANIONGAP 12    No results for input(s): "PROT", "ALBUMIN", "AST", "ALT", "ALKPHOS", "BILITOT" in the last 168 hours. Lipids No results for input(s): "CHOL", "TRIG", "HDL", "LABVLDL", "LDLCALC", "CHOLHDL" in the last 168 hours.  Hematology Recent Labs  Lab 11/16/22 0850  WBC 6.9  RBC 4.41  HGB 13.3  HCT 39.9  MCV 90.5  MCH 30.2  MCHC 33.3  RDW 14.2  PLT 192   Thyroid No results for input(s): "TSH", "FREET4" in the last 168 hours.  BNPNo results for input(s): "BNP", "PROBNP" in the last 168 hours.  DDimer No results for input(s): "DDIMER" in the last 168 hours.   Radiology/Studies:  DG Pelvis 1-2 Views  Result Date: 11/16/2022 CLINICAL DATA:  Pain after fall EXAM: PELVIS - 1 VIEW COMPARISON:  11/15/2021 FINDINGS: Osteopenia there is some concentric joint space loss of both hips. Mild degenerative changes of the sacroiliac joints. No fracture or dislocation. Hyperostosis. Overlapping cardiac leads. Diffuse colonic stool. Presumed vascular calcifications in the pelvis. If there is further concern of subtle nondisplaced injury, additional cross-sectional imaging study may be useful in the setting of osteopenia for higher sensitivity IMPRESSION: Osteopenia.  Degenerative changes. Electronically Signed   By: Karen Kays M.D.   On: 11/16/2022 10:05   DG Chest Portable 1 View  Result Date: 11/16/2022 CLINICAL DATA:  Pain after fall EXAM: PORTABLE CHEST 1 VIEW COMPARISON:  X-ray 11/02/2022 FINDINGS: Stable borderline cardiopericardial silhouette. Calcified aorta. Chronic interstitial changes  are again seen. No pneumothorax, effusion or edema. No consolidation. Overlapping cardiac leads. Calcific tendinitis  along the right shoulder seen at the edge of the imaging field. The right inferior costophrenic angle is clipped off the edge of the film. IMPRESSION: Enlarged heart.  Chronic interstitial lung changes. Electronically Signed   By: Karen Kays M.D.   On: 11/16/2022 10:03   CT Lumbar Spine Wo Contrast  Result Date: 11/16/2022 CLINICAL DATA:  Back trauma, no prior imaging (Age >= 16y) EXAM: CT LUMBAR SPINE WITHOUT CONTRAST TECHNIQUE: Multidetector CT imaging of the lumbar spine was performed without intravenous contrast administration. Multiplanar CT image reconstructions were also generated. RADIATION DOSE REDUCTION: This exam was performed according to the departmental dose-optimization program which includes automated exposure control, adjustment of the mA and/or kV according to patient size and/or use of iterative reconstruction technique. COMPARISON:  None Available. FINDINGS: Segmentation: 5 lumbar type vertebrae. Alignment: Grade 1 anterolisthesis of L3 on L4 and L4-L5. Vertebrae: No acute fracture or focal pathologic process. Paraspinal and other soft tissues: Aortic atherosclerotic calcifications. Disc levels: There is a least moderate spinal canal narrowing at L4-L5 (series 7, image 93). IMPRESSION: 1. No acute fracture or traumatic listhesis. 2. At least moderate spinal canal narrowing at L4-L5. Aortic Atherosclerosis (ICD10-I70.0). Electronically Signed   By: Lorenza Cambridge M.D.   On: 11/16/2022 09:51   CT Head Wo Contrast  Result Date: 11/16/2022 CLINICAL DATA:  Head trauma, minor (Age >= 65y); Neck trauma (Age >= 65y) EXAM: CT HEAD WITHOUT CONTRAST CT CERVICAL SPINE WITHOUT CONTRAST TECHNIQUE: Multidetector CT imaging of the head and cervical spine was performed following the standard protocol without intravenous contrast. Multiplanar CT image reconstructions of the cervical spine  were also generated. RADIATION DOSE REDUCTION: This exam was performed according to the departmental dose-optimization program which includes automated exposure control, adjustment of the mA and/or kV according to patient size and/or use of iterative reconstruction technique. COMPARISON:  CT head and C Spine 11/02/22 FINDINGS: CT HEAD FINDINGS Brain: No evidence of acute infarction, hemorrhage, hydrocephalus, extra-axial collection or mass lesion/mass effect. There is sequela moderate to severe chronic microvascular ischemic change. Vascular: No hyperdense vessel or unexpected calcification. Skull: Normal. Negative for fracture or focal lesion. Sinuses/Orbits: No middle ear or mastoid effusion. Paranasal sinuses are notable for mild mucosal thickening in the left sphenoid sinus. Bilateral lens replacement. Orbits are otherwise unremarkable. Other: None. CT CERVICAL SPINE FINDINGS Alignment: Grade 1 anterolisthesis of C3 on C4 and C4 on C5. Skull base and vertebrae: No acute fracture. No primary bone lesion or focal pathologic process. Severe degenerative changes of the left-sided facet joint at C3-C4 (series 9, image 40). Soft tissues and spinal canal: No prevertebral fluid or swelling. No visible canal hematoma. Disc levels:  No CT evidence of high-grade spinal canal stenosis. Upper chest: Negative. Other: No IMPRESSION: 1. No acute intracranial abnormality. 2. No acute fracture or traumatic subluxation of the cervical spine. 3. Severe degenerative changes of the left-sided facet joint at C3-C4. Electronically Signed   By: Lorenza Cambridge M.D.   On: 11/16/2022 09:40   CT Cervical Spine Wo Contrast  Result Date: 11/16/2022 CLINICAL DATA:  Head trauma, minor (Age >= 65y); Neck trauma (Age >= 65y) EXAM: CT HEAD WITHOUT CONTRAST CT CERVICAL SPINE WITHOUT CONTRAST TECHNIQUE: Multidetector CT imaging of the head and cervical spine was performed following the standard protocol without intravenous contrast. Multiplanar CT  image reconstructions of the cervical spine were also generated. RADIATION DOSE REDUCTION: This exam was performed according to the departmental dose-optimization program which includes automated exposure control, adjustment of the mA  and/or kV according to patient size and/or use of iterative reconstruction technique. COMPARISON:  CT head and C Spine 11/02/22 FINDINGS: CT HEAD FINDINGS Brain: No evidence of acute infarction, hemorrhage, hydrocephalus, extra-axial collection or mass lesion/mass effect. There is sequela moderate to severe chronic microvascular ischemic change. Vascular: No hyperdense vessel or unexpected calcification. Skull: Normal. Negative for fracture or focal lesion. Sinuses/Orbits: No middle ear or mastoid effusion. Paranasal sinuses are notable for mild mucosal thickening in the left sphenoid sinus. Bilateral lens replacement. Orbits are otherwise unremarkable. Other: None. CT CERVICAL SPINE FINDINGS Alignment: Grade 1 anterolisthesis of C3 on C4 and C4 on C5. Skull base and vertebrae: No acute fracture. No primary bone lesion or focal pathologic process. Severe degenerative changes of the left-sided facet joint at C3-C4 (series 9, image 40). Soft tissues and spinal canal: No prevertebral fluid or swelling. No visible canal hematoma. Disc levels:  No CT evidence of high-grade spinal canal stenosis. Upper chest: Negative. Other: No IMPRESSION: 1. No acute intracranial abnormality. 2. No acute fracture or traumatic subluxation of the cervical spine. 3. Severe degenerative changes of the left-sided facet joint at C3-C4. Electronically Signed   By: Lorenza Cambridge M.D.   On: 11/16/2022 09:40     Assessment and Plan:   Fall Patient with chronic history of falls and balance issues.  States that she got up to use the bathroom and had issues with balance and had fallen and hit her head.  No evidence of syncope or loss of consciousness.  Overall imaging of head and back have been negative.   On  telemetry she does have frequent PACs that are chronic.  Low suspicion for any arrhythmia/cardiogenic cause of her instability.  Presentation and recent admission for same issue seems consistent with balance issues rather than cardiogenic etiologies.  Also noted to have mildly elevated creatinine which may suggest some element of dehydration. Agree with checking orthostatics  Elevated troponins 36-151.  Has vague intermittent complaints of chest pain that seem somewhat atypical.  Not sure if her troponin elevation can be related to underlying cardiac etiology or more attributed to her recent fall.  Previous notes and family members have discuss more conservative measures and deferment of invasive evaluations.  Additionally patient would not be a great cardiac catheterization candidate due to overall fall risk, inability to tolerate DAPT (concern for compliance due to memory impairment).  She is on Plavix chronically for TIAs, aspirin has been discontinued because of her falls.  Will discuss with MD  Echo pending   Tylenol use Patient states that she takes 2 tablets of 500 mg of Tylenol TID for the past several months.  She initially took this for some back and shoulder pain however now seems to be taking routinely.  Will check liver enzymes, further evaluation per primary team.  TIA Chronically on Plavix, statin  Frequent PACs Likely to contribute to her abnormal heart rates.  Do not think this has clinical significance.  Beta-blockers discontinued previously due to bradycardia.  Moderate PR/moderate TR/mild MR Follow-up with echocardiogram.  Also has grade 2 diastolic dysfunction.   Risk Assessment/Risk Scores:   TIMI Risk Score for Unstable Angina or Non-ST Elevation MI:   The patient's TIMI risk score is 5, which indicates a 26% risk of all cause mortality, new or recurrent myocardial infarction or need for urgent revascularization in the next 14 days.{   For questions or updates, please  contact [facility] Please consult [email or web address] for contact info under  Charlynn Grimes, PA-C  11/16/2022 4:08 PM    Patient seen and examined. Agree with assessment and plan.  Ms. Andrica Rowlette is a very pleasant 89 year old female who is well-known to me.  I have been taking care of her for over 20 years.  She has remote history of TIA/lacunar infarcts, as well as a history of hypertension, hyperlipidemia, frequent PACs, has had some balance issues.  She also admits to recent memory and cognitive deficits.  She lives at [facility] and has a caretaker.  I have recently seen her in the office 08/25/2022.  Echo Doppler study February 2020 failure showed hyperdynamic LV function with EF 70 to 75%, grade 2 diastolic dysfunction, severe left atrial lodgment, moderate TR and moderate pulmonic valve regurgitation.  At times she admits to mild shortness of breath.  She denies chest pain.  She does exercise on the stationary bike.  He has been on long-term clopidogrel following her TIA and is no longer on aspirin.  She has been on low-dose lipid-lowering therapy.  This morning, she had gotten up and was about to reach her walker when she tripped and fell landing on the carpet.  Denied any presyncope.  She denied any syncope.  She was unaware of any tachycardia.  She presented to the emergency room initial troponin was minimally increased with subsequent levels at 28 > 36 > 151 > 117.  Presently she feels well.  She has documented frequent PACs and at times has atrial bigeminal rhythm.  CT earlier today shows sinus rhythm with atrial bigeminy at 93 bpm with no ST segment changes.  Blood pressure was stable at 121/53.  Continues unremarkable.  She does not have carotid bruits.  Lungs were clear.  Rhythm was regular with occasional ectopy.  There is a systolic murmur.  Abdomen was soft and nontender.  No edema.  Head CT did not show any acute intracranial abnormality or acute fracture or traumatic  subluxation of the cervical spine.  He does have degenerative changes of the left side and facet joint at C3-4.  She has mild spinal canal narrowing at L4-L5.  Chest x-ray suggested mild cardiomegaly with chronic interstitial lung changes.  Clinically, she is relatively stable.  Cervidil management is indicated.  Depending upon heart rate and blood pressure, she may be a candidate for very low-dose institution of beta-blocker therapy if ectopy increases.   Lennette Bihari, MD, [facility] 11/16/2022 4:33 PM

## 2022-11-16 NOTE — ED Notes (Signed)
ED TO INPATIENT HANDOFF REPORT  ED Nurse Name and Phone #:   S Name/Age/Gender Valerie Barker 89 y.o. female Room/Bed: 003C/003C  Code Status   Code Status: DNR  Home/SNF/Other Skilled nursing facility Patient oriented to: self, place, time, and situation Is this baseline? Yes   Triage Complete: Triage complete  Chief Complaint Syncope and collapse [R55]  Triage Note No notes on file   Allergies Allergies  Allergen Reactions   Duloxetine Other (See Comments)    unknown   Erythromycin    Mirtazapine     Other reaction(s): Other (See Comments) unknown   Trazodone     Other reaction(s): Other (See Comments) unknown   Zolpidem     Other reaction(s): Other (See Comments) unknown   Cyclobenzaprine Anxiety    Unknown   Diazepam Rash    unknown    Level of Care/Admitting Diagnosis ED Disposition     ED Disposition  Admit   Condition  --   Comment  Hospital Area: [facility] [[id number]]  Level of Care: Telemetry Cardiac [[id number]]  May place patient in observation at [facility] or [facility] if equivalent level of care is available:: No  Covid Evaluation: Asymptomatic - no recent exposure (last 10 days) testing not required  Diagnosis: Syncope and collapse [780.2.ICD-9-CM]  Admitting Physician: Synetta Fail [[id number]]  Attending Physician: Synetta Fail [[id number]]          B Medical/Surgery History Past Medical History:  Diagnosis Date   Acute urinary retention 04/17/2018   Heart disease    Hypertension    IBS (irritable bowel syndrome)    Stroke (HCC) 04/17/2018   around 2011   TIA (transient ischemic attack)    Past Surgical History:  Procedure Laterality Date   ABDOMINAL HYSTERECTOMY  1978   BUNIONECTOMY  2006, 1996   LAPAROSCOPIC SALPINGO OOPHERECTOMY  1988     A IV Location/Drains/Wounds Patient Lines/Drains/Airways Status     Active Line/Drains/Airways     Name Placement date Placement time Site Days    Peripheral IV 11/15/21 18 G Anterior;Left;Proximal Forearm 11/15/21  0620  Forearm  366            Intake/Output Last 24 hours No intake or output data in the 24 hours ending 11/16/22 1344  Labs/Imaging Results for orders placed or performed during the hospital encounter of 11/16/22 (from the past 48 hour(s))  CBC     Status: None   Collection Time: 11/16/22  8:50 AM  Result Value Ref Range   WBC 6.9 4.0 - 10.5 K/uL   RBC 4.41 3.87 - 5.11 MIL/uL   Hemoglobin 13.3 12.0 - 15.0 g/dL   HCT 24.2 35.3 - 61.4 %   MCV 90.5 80.0 - 100.0 fL   MCH 30.2 26.0 - 34.0 pg   MCHC 33.3 30.0 - 36.0 g/dL   RDW 43.1 54.0 - 08.6 %   Platelets 192 150 - 400 K/uL   nRBC 0.0 0.0 - 0.2 %    Comment: Performed at [facility], [address], [place], [place] [address]  Basic metabolic panel     Status: Abnormal   Collection Time: 11/16/22  8:50 AM  Result Value Ref Range   Sodium 137 135 - 145 mmol/L   Potassium 4.2 3.5 - 5.1 mmol/L    Comment: HEMOLYSIS AT THIS LEVEL MAY AFFECT RESULT   Chloride 102 98 - 111 mmol/L   CO2 23 22 - 32 mmol/L   Glucose,  Bld 100 (H) 70 - 99 mg/dL    Comment: Glucose reference range applies only to samples taken after fasting for at least 8 hours.   BUN 26 (H) 8 - 23 mg/dL   Creatinine, Ser 2.95 (H) 0.44 - 1.00 mg/dL   Calcium 9.7 8.9 - 28.4 mg/dL   GFR, Estimated 41 (L) >60 mL/min    Comment: (NOTE) Calculated using the CKD-EPI Creatinine Equation (2021)    Anion gap 12 5 - 15    Comment: Performed at [facility], [address] [place], [place] [address]  Troponin I (High Sensitivity)     Status: Abnormal   Collection Time: 11/16/22  8:50 AM  Result Value Ref Range   Troponin I (High Sensitivity) 36 (H) <18 ng/L    Comment: (NOTE) Elevated high sensitivity troponin I (hsTnI) values and significant  changes across serial measurements may suggest ACS but many other  chronic and acute conditions are known to elevate hsTnI results.  Refer to the  "Links" section for chest pain algorithms and additional  guidance. Performed at [facility], [address], [place], [place] [address]   Troponin I (High Sensitivity)     Status: Abnormal   Collection Time: 11/16/22 10:50 AM  Result Value Ref Range   Troponin I (High Sensitivity) 151 (HH) <18 ng/L    Comment: CRITICAL RESULT CALLED TO, READ BACK BY AND VERIFIED WITH Ellender Hose, RN @ 1150 11/16/22 BY SEKDAHL (NOTE) Elevated high sensitivity troponin I (hsTnI) values and significant  changes across serial measurements may suggest ACS but many other  chronic and acute conditions are known to elevate hsTnI results.  Refer to the "Links" section for chest pain algorithms and additional  guidance. Performed at [facility], [address] [place], [place] [address]    DG Pelvis 1-2 Views  Result Date: 11/16/2022 CLINICAL DATA:  Pain after fall EXAM: PELVIS - 1 VIEW COMPARISON:  11/15/2021 FINDINGS: Osteopenia there is some concentric joint space loss of both hips. Mild degenerative changes of the sacroiliac joints. No fracture or dislocation. Hyperostosis. Overlapping cardiac leads. Diffuse colonic stool. Presumed vascular calcifications in the pelvis. If there is further concern of subtle nondisplaced injury, additional cross-sectional imaging study may be useful in the setting of osteopenia for higher sensitivity IMPRESSION: Osteopenia.  Degenerative changes. Electronically Signed   By: Karen Kays M.D.   On: 11/16/2022 10:05   DG Chest Portable 1 View  Result Date: 11/16/2022 CLINICAL DATA:  Pain after fall EXAM: PORTABLE CHEST 1 VIEW COMPARISON:  X-ray 11/02/2022 FINDINGS: Stable borderline cardiopericardial silhouette. Calcified aorta. Chronic interstitial changes are again seen. No pneumothorax, effusion or edema. No consolidation. Overlapping cardiac leads. Calcific tendinitis along the right shoulder seen at the edge of the imaging field. The right inferior costophrenic  angle is clipped off the edge of the film. IMPRESSION: Enlarged heart.  Chronic interstitial lung changes. Electronically Signed   By: Karen Kays M.D.   On: 11/16/2022 10:03   CT Lumbar Spine Wo Contrast  Result Date: 11/16/2022 CLINICAL DATA:  Back trauma, no prior imaging (Age >= 16y) EXAM: CT LUMBAR SPINE WITHOUT CONTRAST TECHNIQUE: Multidetector CT imaging of the lumbar spine was performed without intravenous contrast administration. Multiplanar CT image reconstructions were also generated. RADIATION DOSE REDUCTION: This exam was performed according to the departmental dose-optimization program which includes automated exposure control, adjustment of the mA and/or kV according to patient size and/or use of iterative reconstruction technique. COMPARISON:  None Available. FINDINGS: Segmentation: 5 lumbar type vertebrae. Alignment: Grade 1 anterolisthesis of L3 on L4 and L4-L5. Vertebrae: No acute fracture or focal pathologic process. Paraspinal and other soft tissues: Aortic atherosclerotic calcifications. Disc levels: There is a least moderate spinal canal narrowing at L4-L5 (series 7, image 93). IMPRESSION: 1. No acute fracture or traumatic listhesis. 2. At least moderate spinal canal narrowing at L4-L5. Aortic Atherosclerosis (ICD10-I70.0). Electronically Signed   By: Lorenza Cambridge M.D.   On: 11/16/2022 09:51   CT Head Wo Contrast  Result Date: 11/16/2022 CLINICAL DATA:  Head trauma, minor (Age >= 65y); Neck trauma (Age >= 65y) EXAM: CT HEAD WITHOUT CONTRAST CT CERVICAL SPINE WITHOUT CONTRAST TECHNIQUE: Multidetector CT imaging of the head and cervical spine was performed following the standard protocol without intravenous contrast. Multiplanar CT image reconstructions of the cervical spine were also generated. RADIATION DOSE REDUCTION: This exam was performed according to the departmental dose-optimization program which includes automated exposure control, adjustment of the mA and/or kV according to  patient size and/or use of iterative reconstruction technique. COMPARISON:  CT head and C Spine 11/02/22 FINDINGS: CT HEAD FINDINGS Brain: No evidence of acute infarction, hemorrhage, hydrocephalus, extra-axial collection or mass lesion/mass effect. There is sequela moderate to severe chronic microvascular ischemic change. Vascular: No hyperdense vessel or unexpected calcification. Skull: Normal. Negative for fracture or focal lesion. Sinuses/Orbits: No middle ear or mastoid effusion. Paranasal sinuses are notable for mild mucosal thickening in the left sphenoid sinus. Bilateral lens replacement. Orbits are otherwise unremarkable. Other: None. CT CERVICAL SPINE FINDINGS Alignment: Grade 1 anterolisthesis of C3 on C4 and C4 on C5. Skull base and vertebrae: No acute fracture. No primary bone lesion or focal pathologic process. Severe degenerative changes of the left-sided facet joint at C3-C4 (series 9, image 40). Soft tissues and spinal canal: No prevertebral fluid or swelling. No visible canal hematoma. Disc levels:  No CT evidence of high-grade spinal canal stenosis. Upper chest: Negative. Other: No IMPRESSION: 1. No acute intracranial abnormality. 2. No acute fracture or traumatic subluxation of the cervical spine. 3. Severe degenerative changes of the left-sided facet joint at C3-C4. Electronically Signed   By: Lorenza Cambridge M.D.   On: 11/16/2022 09:40   CT Cervical Spine Wo Contrast  Result Date: 11/16/2022 CLINICAL DATA:  Head trauma, minor (Age >= 65y); Neck trauma (Age >= 65y) EXAM: CT HEAD WITHOUT CONTRAST CT CERVICAL SPINE WITHOUT CONTRAST TECHNIQUE: Multidetector CT imaging of the head and cervical spine was performed following the standard protocol without intravenous contrast. Multiplanar CT image reconstructions of the cervical spine were also generated. RADIATION DOSE REDUCTION: This exam was performed according to the departmental dose-optimization program which includes automated exposure control,  adjustment of the mA and/or kV according to patient size and/or use of iterative reconstruction technique. COMPARISON:  CT head and C Spine 11/02/22 FINDINGS: CT HEAD FINDINGS Brain: No evidence of acute infarction, hemorrhage, hydrocephalus, extra-axial collection or mass lesion/mass effect. There is sequela moderate to severe chronic microvascular ischemic change. Vascular: No hyperdense vessel or unexpected calcification. Skull: Normal. Negative for fracture or focal lesion. Sinuses/Orbits: No middle ear or mastoid effusion. Paranasal sinuses are notable for mild mucosal thickening in the left sphenoid sinus. Bilateral lens replacement. Orbits are otherwise unremarkable. Other: None. CT CERVICAL SPINE FINDINGS Alignment: Grade 1 anterolisthesis of C3 on C4 and C4 on C5. Skull base and vertebrae: No acute fracture. No primary bone lesion or focal pathologic process. Severe degenerative changes of the left-sided facet joint at  C3-C4 (series 9, image 40). Soft tissues and spinal canal: No prevertebral fluid or swelling. No visible canal hematoma. Disc levels:  No CT evidence of high-grade spinal canal stenosis. Upper chest: Negative. Other: No IMPRESSION: 1. No acute intracranial abnormality. 2. No acute fracture or traumatic subluxation of the cervical spine. 3. Severe degenerative changes of the left-sided facet joint at C3-C4. Electronically Signed   By: Lorenza Cambridge M.D.   On: 11/16/2022 09:40    Pending Labs Unresulted Labs (From admission, onward)     Start     Ordered   11/23/22 0500  Creatinine, serum  (enoxaparin (LOVENOX)    CrCl < 30 ml/min)  Once,   R       Comments: while on enoxaparin therapy.    11/16/22 1302   11/17/22 0500  Comprehensive metabolic panel  Tomorrow morning,   R        11/16/22 1302   11/17/22 0500  CBC  Tomorrow morning,   R        11/16/22 1302            Vitals/Pain Today's Vitals   11/16/22 1130 11/16/22 1245 11/16/22 1253 11/16/22 1254  BP: 136/60 (!)  131/55    Pulse: 67 (!) 56    Resp: (!) 21 (!) 25    Temp:   98.3 F (36.8 C)   TempSrc:   Oral   SpO2: 98% 98%    Weight:      Height:      PainSc:    Asleep    Isolation Precautions No active isolations  Medications Medications  aspirin tablet 325 mg (325 mg Oral Given 11/16/22 1235)  atorvastatin (LIPITOR) tablet 10 mg (has no administration in time range)  hydrochlorothiazide (HYDRODIURIL) tablet 12.5 mg (has no administration in time range)  donepezil (ARICEPT) tablet 10 mg (has no administration in time range)  rOPINIRole (REQUIP) tablet 1 mg (has no administration in time range)  sodium chloride flush (NS) 0.9 % injection 3 mL (has no administration in time range)  enoxaparin (LOVENOX) injection 30 mg (has no administration in time range)  acetaminophen (TYLENOL) tablet 650 mg (has no administration in time range)    Or  acetaminophen (TYLENOL) suppository 650 mg (has no administration in time range)  polyethylene glycol (MIRALAX / GLYCOLAX) packet 17 g (has no administration in time range)  sodium chloride 0.9 % bolus 500 mL (has no administration in time range)    Mobility walks with person assist     Focused Assessments Cardiac Assessment Handoff:    Lab Results  Component Value Date   CKTOTAL 101 04/28/2009   CKMB 2.0 04/28/2009   TROPONINI <0.30 01/28/2013   No results found for: "DDIMER" Does the Patient currently have chest pain? No    R Recommendations: See Admitting Provider Note  Report given to:   Additional Notes:

## 2022-11-16 NOTE — ED Provider Notes (Signed)
[place] [facility] PROGRESSIVE CARE Provider Note   CSN: [id number] Arrival date & time: 11/16/22  0841     History  Chief Complaint  Patient presents with   Valerie Barker    Valerie Barker is a 89 y.o. female.  Presented to the emergency department as a trauma activation status post fall on blood thinners.  Patient known to me had similar presentation 2 weeks ago.  Reports fall was unwitnessed, she has a baseline dementia and does not necessarily know why she fell she thinks she lost her balance.  Was complaining of lightheadedness and some chest pain on arrival.  Per caregiver this is seemingly an intermittent and subacute/chronic problem from her with lightheadedness and chest pain.  Complains of back pain.   Fall       Home Medications Prior to Admission medications   Medication Sig Start Date End Date Taking? Authorizing Provider  atorvastatin (LIPITOR) 10 MG tablet TAKE 1/2 TABLET EVERY EVENING Patient taking differently: Take 5 mg by mouth every evening. 12/20/21  Yes Lennette Bihari, MD  Biotin 78295 MCG TABS Take 10,000 mcg by mouth every evening.    Yes [provider]  calcium carbonate (OSCAL) 1500 (600 Ca) MG TABS tablet Take 600 mg of elemental calcium by mouth daily.   Yes [provider]  Cholecalciferol (VITAMIN D) 2000 UNITS CAPS Take 2,000 Units by mouth daily.   Yes [provider]  clopidogrel (PLAVIX) 75 MG tablet Take 75 mg by mouth daily.   Yes [provider]  Cobalamin Combinations (B-12 + FOLIC ACID) 2500-400 MCG TBDP Take 1 tablet by mouth daily.   Yes [provider]  donepezil (ARICEPT) 10 MG tablet Take 10 mg by mouth at bedtime. 10/05/22  Yes [provider]  hydrochlorothiazide (HYDRODIURIL) 12.5 MG tablet TAKE 1 OR 2 TABLETS BY MOUTH EVERY DAY Patient taking differently: Take 12.5-25 mg by mouth See admin instructions. Alternate 12.5 mg (1 tablet) and 25 mg (2 tablets) every other day. 09/19/22  Yes  Lennette Bihari, MD  levothyroxine (SYNTHROID) 25 MCG tablet Take 25-50 mcg by mouth See admin instructions. 25 mcg once daily before breakfast Monday-Saturday. 50 mcg on Sundays.   Yes [provider]  Multiple Vitamins-Minerals (ONE-A-DAY WOMENS 50+) TABS Take 1 tablet by mouth daily.   Yes [provider]  rOPINIRole (REQUIP) 1 MG tablet Take 2 mg by mouth See admin instructions. 2 mg every afternoon and 2 mg every evening 05/26/18  Yes [provider]  trimethoprim (TRIMPEX) 100 MG tablet Take 100 mg by mouth daily. 09/27/22  Yes [provider]      Allergies    Ambien [zolpidem], Cymbalta [duloxetine hcl], Desyrel [trazodone], Erythromycin, Remeron [mirtazapine], Flexeril [cyclobenzaprine], and Valium [diazepam]    Review of Systems   Review of Systems  Physical Exam Updated Vital Signs BP 127/66 (BP Location: Right Arm)   Pulse (!) 59   Temp 97.7 F (36.5 C) (Oral)   Resp 18   Ht 4\' 11"  (1.499 m)   Wt 56.7 kg   SpO2 98%   BMI 25.25 kg/m  Physical Exam Vitals and nursing note reviewed.  Constitutional:      General: She is not in acute distress.    Appearance: Normal appearance. She is not toxic-appearing.  HENT:     Head: Normocephalic and atraumatic.     Nose: Nose normal.     Mouth/Throat:     Mouth: Mucous membranes are moist.  Eyes:  Conjunctiva/sclera: Conjunctivae normal.  Cardiovascular:     Rate and Rhythm: Normal rate.     Pulses: Normal pulses.  Pulmonary:     Effort: Pulmonary effort is normal.     Breath sounds: Normal breath sounds.  Abdominal:     General: Abdomen is flat. There is no distension.     Tenderness: There is no abdominal tenderness. There is no guarding or rebound.  Musculoskeletal:     Cervical back: Normal range of motion. No tenderness.     Comments: Chest wall stable, nontender.  No bony tenderness to extremities.  Has some global weakness, but good grip strength and plantarflexion dorsiflexion.   Normal sensation throughout all extremities.  No midline spinal tenderness.  Neurological:     Mental Status: She is alert. Mental status is at baseline.  Psychiatric:        Mood and Affect: Mood normal.        Behavior: Behavior normal.     ED Results / Procedures / Treatments   Labs (all labs ordered are listed, but only abnormal results are displayed) Labs Reviewed  BASIC METABOLIC PANEL - Abnormal; Notable for the following components:      Result Value   Glucose, Bld 100 (*)    BUN 26 (*)    Creatinine, Ser 1.20 (*)    GFR, Estimated 41 (*)    All other components within normal limits  TROPONIN I (HIGH SENSITIVITY) - Abnormal; Notable for the following components:   Troponin I (High Sensitivity) 36 (*)    All other components within normal limits  TROPONIN I (HIGH SENSITIVITY) - Abnormal; Notable for the following components:   Troponin I (High Sensitivity) 151 (*)    All other components within normal limits  TROPONIN I (HIGH SENSITIVITY) - Abnormal; Notable for the following components:   Troponin I (High Sensitivity) 117 (*)    All other components within normal limits  CBC  HEPATIC FUNCTION PANEL  COMPREHENSIVE METABOLIC PANEL  CBC  TROPONIN I (HIGH SENSITIVITY)    EKG None  Radiology DG Pelvis 1-2 Views  Result Date: 11/16/2022 CLINICAL DATA:  Pain after fall EXAM: PELVIS - 1 VIEW COMPARISON:  11/15/2021 FINDINGS: Osteopenia there is some concentric joint space loss of both hips. Mild degenerative changes of the sacroiliac joints. No fracture or dislocation. Hyperostosis. Overlapping cardiac leads. Diffuse colonic stool. Presumed vascular calcifications in the pelvis. If there is further concern of subtle nondisplaced injury, additional cross-sectional imaging study may be useful in the setting of osteopenia for higher sensitivity IMPRESSION: Osteopenia.  Degenerative changes. Electronically Signed   By: Karen Kays M.D.   On: 11/16/2022 10:05   DG Chest  Portable 1 View  Result Date: 11/16/2022 CLINICAL DATA:  Pain after fall EXAM: PORTABLE CHEST 1 VIEW COMPARISON:  X-ray 11/02/2022 FINDINGS: Stable borderline cardiopericardial silhouette. Calcified aorta. Chronic interstitial changes are again seen. No pneumothorax, effusion or edema. No consolidation. Overlapping cardiac leads. Calcific tendinitis along the right shoulder seen at the edge of the imaging field. The right inferior costophrenic angle is clipped off the edge of the film. IMPRESSION: Enlarged heart.  Chronic interstitial lung changes. Electronically Signed   By: Karen Kays M.D.   On: 11/16/2022 10:03   CT Lumbar Spine Wo Contrast  Result Date: 11/16/2022 CLINICAL DATA:  Back trauma, no prior imaging (Age >= 16y) EXAM: CT LUMBAR SPINE WITHOUT CONTRAST TECHNIQUE: Multidetector CT imaging of the lumbar spine was performed without intravenous contrast administration. Multiplanar CT image reconstructions  were also generated. RADIATION DOSE REDUCTION: This exam was performed according to the departmental dose-optimization program which includes automated exposure control, adjustment of the mA and/or kV according to patient size and/or use of iterative reconstruction technique. COMPARISON:  None Available. FINDINGS: Segmentation: 5 lumbar type vertebrae. Alignment: Grade 1 anterolisthesis of L3 on L4 and L4-L5. Vertebrae: No acute fracture or focal pathologic process. Paraspinal and other soft tissues: Aortic atherosclerotic calcifications. Disc levels: There is a least moderate spinal canal narrowing at L4-L5 (series 7, image 93). IMPRESSION: 1. No acute fracture or traumatic listhesis. 2. At least moderate spinal canal narrowing at L4-L5. Aortic Atherosclerosis (ICD10-I70.0). Electronically Signed   By: Lorenza Cambridge M.D.   On: 11/16/2022 09:51   CT Head Wo Contrast  Result Date: 11/16/2022 CLINICAL DATA:  Head trauma, minor (Age >= 65y); Neck trauma (Age >= 65y) EXAM: CT HEAD WITHOUT CONTRAST  CT CERVICAL SPINE WITHOUT CONTRAST TECHNIQUE: Multidetector CT imaging of the head and cervical spine was performed following the standard protocol without intravenous contrast. Multiplanar CT image reconstructions of the cervical spine were also generated. RADIATION DOSE REDUCTION: This exam was performed according to the departmental dose-optimization program which includes automated exposure control, adjustment of the mA and/or kV according to patient size and/or use of iterative reconstruction technique. COMPARISON:  CT head and C Spine 11/02/22 FINDINGS: CT HEAD FINDINGS Brain: No evidence of acute infarction, hemorrhage, hydrocephalus, extra-axial collection or mass lesion/mass effect. There is sequela moderate to severe chronic microvascular ischemic change. Vascular: No hyperdense vessel or unexpected calcification. Skull: Normal. Negative for fracture or focal lesion. Sinuses/Orbits: No middle ear or mastoid effusion. Paranasal sinuses are notable for mild mucosal thickening in the left sphenoid sinus. Bilateral lens replacement. Orbits are otherwise unremarkable. Other: None. CT CERVICAL SPINE FINDINGS Alignment: Grade 1 anterolisthesis of C3 on C4 and C4 on C5. Skull base and vertebrae: No acute fracture. No primary bone lesion or focal pathologic process. Severe degenerative changes of the left-sided facet joint at C3-C4 (series 9, image 40). Soft tissues and spinal canal: No prevertebral fluid or swelling. No visible canal hematoma. Disc levels:  No CT evidence of high-grade spinal canal stenosis. Upper chest: Negative. Other: No IMPRESSION: 1. No acute intracranial abnormality. 2. No acute fracture or traumatic subluxation of the cervical spine. 3. Severe degenerative changes of the left-sided facet joint at C3-C4. Electronically Signed   By: Lorenza Cambridge M.D.   On: 11/16/2022 09:40   CT Cervical Spine Wo Contrast  Result Date: 11/16/2022 CLINICAL DATA:  Head trauma, minor (Age >= 65y); Neck trauma  (Age >= 65y) EXAM: CT HEAD WITHOUT CONTRAST CT CERVICAL SPINE WITHOUT CONTRAST TECHNIQUE: Multidetector CT imaging of the head and cervical spine was performed following the standard protocol without intravenous contrast. Multiplanar CT image reconstructions of the cervical spine were also generated. RADIATION DOSE REDUCTION: This exam was performed according to the departmental dose-optimization program which includes automated exposure control, adjustment of the mA and/or kV according to patient size and/or use of iterative reconstruction technique. COMPARISON:  CT head and C Spine 11/02/22 FINDINGS: CT HEAD FINDINGS Brain: No evidence of acute infarction, hemorrhage, hydrocephalus, extra-axial collection or mass lesion/mass effect. There is sequela moderate to severe chronic microvascular ischemic change. Vascular: No hyperdense vessel or unexpected calcification. Skull: Normal. Negative for fracture or focal lesion. Sinuses/Orbits: No middle ear or mastoid effusion. Paranasal sinuses are notable for mild mucosal thickening in the left sphenoid sinus. Bilateral lens replacement. Orbits are otherwise unremarkable. Other: None. CT  CERVICAL SPINE FINDINGS Alignment: Grade 1 anterolisthesis of C3 on C4 and C4 on C5. Skull base and vertebrae: No acute fracture. No primary bone lesion or focal pathologic process. Severe degenerative changes of the left-sided facet joint at C3-C4 (series 9, image 40). Soft tissues and spinal canal: No prevertebral fluid or swelling. No visible canal hematoma. Disc levels:  No CT evidence of high-grade spinal canal stenosis. Upper chest: Negative. Other: No IMPRESSION: 1. No acute intracranial abnormality. 2. No acute fracture or traumatic subluxation of the cervical spine. 3. Severe degenerative changes of the left-sided facet joint at C3-C4. Electronically Signed   By: Lorenza Cambridge M.D.   On: 11/16/2022 09:40    Procedures Procedures    Medications Ordered in ED Medications   aspirin tablet 325 mg (325 mg Oral Given 11/16/22 1235)  atorvastatin (LIPITOR) tablet 10 mg (has no administration in time range)  hydrochlorothiazide (HYDRODIURIL) tablet 12.5 mg (has no administration in time range)  donepezil (ARICEPT) tablet 10 mg (has no administration in time range)  rOPINIRole (REQUIP) tablet 2 mg (has no administration in time range)  sodium chloride flush (NS) 0.9 % injection 3 mL (3 mLs Intravenous Given 11/16/22 1451)  enoxaparin (LOVENOX) injection 30 mg (has no administration in time range)  acetaminophen (TYLENOL) tablet 650 mg (has no administration in time range)    Or  acetaminophen (TYLENOL) suppository 650 mg (has no administration in time range)  polyethylene glycol (MIRALAX / GLYCOLAX) packet 17 g (has no administration in time range)  sodium chloride 0.9 % bolus 500 mL (500 mLs Intravenous New Bag/Given 11/16/22 1451)    ED Course/ Medical Decision Making/ A&P Clinical Course as of 11/16/22 1715  Wed Nov 16, 2022  0851 Seen and evaluated on arrival.  Reportedly slipped while getting out of bed, ambulates with walker at baseline.  Larey Seat forward and hit head.  No LOC.  Complains of head fullness/pain as well as some acute on chronic low back pain.  Vitals reported by EMS reassuring.  Trauma activation secondary to her Plavix. [TY]  1021 Per cardiology note 10/25/22: "She underwent an echo Doppler study on May 17, 2022 which revealed hyperdynamic LV function with EF 70 to 75%, grade 2 diastolic dysfunction, severe left atrial enlargement, moderate TR, and moderate pulmonic valve regurgitation" [TY]  1213 Spoke with family regarding elevated troponin and had a long goals of care discussion.  They expressed in no uncertain terms that patient is to be a DNR.  They would not want intubation if it would come to that if her respiratory status was to be compromised.  They want her to be comfortable as possible.  They are agreeable to admission at this point for  patient's elevated troponin.  Consulting cardiology for possible admission. [TY]  1223 Spoke with Dr. Tresa Endo, patient's cardiologist, questioning some arrhythmia possible syncopal event given patient's elevated troponins and ectopy on EKG.  Agrees that observation reasonable, given patient's age and comorbid medical conditions recommending medicine admission with cardiology consult.  Will consult hospitalist. [TY]    Clinical Course User Index [TY] Coral Spikes, DO                                 Medical Decision Making This is a 89 year old female presenting emergency department as a trauma activation for fall.  Unclear circumstances around her fall, patient cannot elaborate on circumstances it was unwitnessed.  She was complaining of  some lightheadedness on arrival.  EKG with no ST segment changes to get ischemia, but appears to have frequent ectopic/PACs.  Her initial troponin is elevated at 36, subsequently elevated to 156.  Labs otherwise reassuring with stable CKD.  No anemia or leukocytosis to suggest systemic infection.  Imaging done without acute pathology CT head, CT cervical spine and CT lumbar spine.  Chest x-ray without pneumothorax or acute pathology.  Pelvis also without acute pathology.  Case discussed with cardiology; see ED course.  Given patient elevation in troponin, ectopies on EKG will admit for syncopal type workup.  I did have a long discussion with son who is POA regarding goals of care they note the patient should be a DNR.  Amount and/or Complexity of Data Reviewed Independent Historian:     Details: See HPI as caregiver notes that patient has complained of some intermittent lightheadedness and chest pain for the past several weeks. External Data Reviewed: labs.    Details: Had mildly elevated from 2 weeks ago Labs: ordered. Radiology: ordered.  Risk Decision regarding hospitalization.          Final Clinical Impression(s) / ED Diagnoses Final diagnoses:   Near syncope  Elevated troponin    Rx / DC Orders ED Discharge Orders     None         Coral Spikes, DO 11/16/22 1715

## 2022-11-17 ENCOUNTER — Observation Stay (HOSPITAL_BASED_OUTPATIENT_CLINIC_OR_DEPARTMENT_OTHER): Payer: Medicare Other

## 2022-11-17 ENCOUNTER — Observation Stay (INDEPENDENT_AMBULATORY_CARE_PROVIDER_SITE_OTHER): Payer: Medicare Other

## 2022-11-17 ENCOUNTER — Other Ambulatory Visit: Payer: Self-pay | Admitting: Cardiology

## 2022-11-17 DIAGNOSIS — I491 Atrial premature depolarization: Secondary | ICD-10-CM

## 2022-11-17 DIAGNOSIS — Z8673 Personal history of transient ischemic attack (TIA), and cerebral infarction without residual deficits: Secondary | ICD-10-CM | POA: Diagnosis not present

## 2022-11-17 DIAGNOSIS — Z7902 Long term (current) use of antithrombotics/antiplatelets: Secondary | ICD-10-CM | POA: Diagnosis not present

## 2022-11-17 DIAGNOSIS — R55 Syncope and collapse: Secondary | ICD-10-CM | POA: Diagnosis not present

## 2022-11-17 DIAGNOSIS — R7989 Other specified abnormal findings of blood chemistry: Secondary | ICD-10-CM | POA: Diagnosis not present

## 2022-11-17 DIAGNOSIS — I493 Ventricular premature depolarization: Secondary | ICD-10-CM

## 2022-11-17 DIAGNOSIS — Z79899 Other long term (current) drug therapy: Secondary | ICD-10-CM | POA: Diagnosis not present

## 2022-11-17 DIAGNOSIS — I10 Essential (primary) hypertension: Secondary | ICD-10-CM | POA: Diagnosis not present

## 2022-11-17 DIAGNOSIS — I119 Hypertensive heart disease without heart failure: Secondary | ICD-10-CM | POA: Diagnosis not present

## 2022-11-17 LAB — ECHOCARDIOGRAM COMPLETE
Area-P 1/2: 3.42 cm2
Height: 59 in
MV M vel: 0.69 m/s
MV Peak grad: 1.9 mmHg
S' Lateral: 2.3 cm
Weight: 2016 [oz_av]

## 2022-11-17 LAB — COMPREHENSIVE METABOLIC PANEL
ALT: 26 U/L (ref 0–44)
AST: 31 U/L (ref 15–41)
Albumin: 3.4 g/dL — ABNORMAL LOW (ref 3.5–5.0)
Alkaline Phosphatase: 50 U/L (ref 38–126)
Anion gap: 8 (ref 5–15)
BUN: 19 mg/dL (ref 8–23)
CO2: 26 mmol/L (ref 22–32)
Calcium: 8.9 mg/dL (ref 8.9–10.3)
Chloride: 104 mmol/L (ref 98–111)
Creatinine, Ser: 1.06 mg/dL — ABNORMAL HIGH (ref 0.44–1.00)
GFR, Estimated: 48 mL/min — ABNORMAL LOW (ref >60)
Glucose, Bld: 97 mg/dL (ref 70–99)
Potassium: 3.4 mmol/L — ABNORMAL LOW (ref 3.5–5.1)
Sodium: 138 mmol/L (ref 135–145)
Total Bilirubin: 0.5 mg/dL (ref 0.3–1.2)
Total Protein: 6.2 g/dL — ABNORMAL LOW (ref 6.5–8.1)

## 2022-11-17 LAB — CBC
HCT: 37.1 % (ref 36.0–46.0)
Hemoglobin: 12.2 g/dL (ref 12.0–15.0)
MCH: 29.3 pg (ref 26.0–34.0)
MCHC: 32.9 g/dL (ref 30.0–36.0)
MCV: 89.2 fL (ref 80.0–100.0)
Platelets: 158 10*3/uL (ref 150–400)
RBC: 4.16 MIL/uL (ref 3.87–5.11)
RDW: 14.3 % (ref 11.5–15.5)
WBC: 5.8 10*3/uL (ref 4.0–10.5)
nRBC: 0 % (ref 0.0–0.2)

## 2022-11-17 LAB — GLUCOSE, CAPILLARY: Glucose-Capillary: 90 mg/dL (ref 70–99)

## 2022-11-17 MED ORDER — ASPIRIN 81 MG PO TBEC
81.0000 mg | DELAYED_RELEASE_TABLET | Freq: Every day | ORAL | Status: DC
  Administered 2022-11-17 – 2022-11-18 (×2): 81 mg via ORAL
  Filled 2022-11-17 (×2): qty 1

## 2022-11-17 NOTE — Care Management Obs Status (Signed)
MEDICARE OBSERVATION STATUS NOTIFICATION   Patient Details  Name: Valerie Barker MRN: [id number] Date of Birth: 12/19/1922   Medicare Observation Status Notification Given:  Yes    Gala Lewandowsky, RN 11/17/2022, 4:43 PM

## 2022-11-17 NOTE — Evaluation (Signed)
Occupational Therapy Evaluation Patient Details Name: Valerie Barker MRN: [id number] DOB: 06/14/1922 Today's Date: 11/17/2022   History of Present Illness Pt is a 89 y.o. female admitted from [facility] on 11/16/22 after unwitnessed fall, concern for syncope. Head and neck imaging negative for acute abnormality. PMH includes HTN, IBS, stroke, TIA.   Clinical Impression   Patient lives in Independent living at ALF and reports that she is Independent in ADLs and has caregivers that completed IADLs for patient.  Patient is mod I using rollator for functional mobility.  Patient completed functional mobility and transfer in room with CGA using RW and min A for clothing mgmt for toileting task and for sit to stand from toilet 2/2 lower commode but able to use grab bar to help assist self to standing position.  Patient would benefit from [facility] to address functional deficits in order for patient to increase overall safety and Independent in ADLs,      If plan is discharge home, recommend the following: Assist for transportation;Assistance with cooking/housework;Direct supervision/assist for medications management;Direct supervision/assist for financial management;A little help with bathing/dressing/bathroom    Functional Status Assessment  Patient has had a recent decline in their functional status and demonstrates the ability to make significant improvements in function in a reasonable and predictable amount of time.  Equipment Recommendations  None recommended by OT    Recommendations for Other Services       Precautions / Restrictions Precautions Precautions: Fall Restrictions Weight Bearing Restrictions: No      Mobility Bed Mobility Overal bed mobility: Needs Assistance Bed Mobility:  (patient sititng in chair upon arrival into room)                Transfers Overall transfer level: Needs assistance Equipment used: Rollator (4 wheels) Transfers: Sit to/from Stand,  Bed to chair/wheelchair/BSC Sit to Stand: Contact guard assist, Supervision (verbal cues to push up from seated surface)     Step pivot transfers: Contact guard assist            Balance Overall balance assessment: Needs assistance Sitting-balance support: No upper extremity supported, Feet supported                                       ADL either performed or assessed with clinical judgement   ADL Overall ADL's : Needs assistance/impaired Eating/Feeding: Set up;Sitting   Grooming: Wash/dry hands;Contact guard assist;Standing   Upper Body Bathing: Minimal assistance;Sitting   Lower Body Bathing: Minimal assistance   Upper Body Dressing : Minimal assistance;Sitting   Lower Body Dressing: Minimal assistance   Toilet Transfer: Contact guard assist;Rolling walker (2 wheels)   Toileting- Clothing Manipulation and Hygiene: Minimal assistance       Functional mobility during ADLs: Contact guard assist;Rolling walker (2 wheels)       Vision Patient Visual Report: No change from baseline       Perception         Praxis         Pertinent Vitals/Pain Pain Assessment Pain Assessment: 0-10 Pain Score: 2  Faces Pain Scale: Hurts a little bit Pain Location: mid back and between shoulder blades Pain Descriptors / Indicators: Constant, Sore Pain Intervention(s): Monitored during session     Extremity/Trunk Assessment Upper Extremity Assessment Upper Extremity Assessment: Generalized weakness   Lower Extremity Assessment Lower Extremity Assessment: Defer to PT evaluation   Cervical / Trunk Assessment  Cervical / Trunk Assessment: Kyphotic   Communication Communication Communication: No apparent difficulties   Cognition Arousal: Alert Behavior During Therapy: WFL for tasks assessed/performed Overall Cognitive Status: History of cognitive impairments - at baseline                                       General Comments  educ  re: POC, activity recommendations, fall risk reduction. 1x bout of BP down to 86/48 (MAP 62) after taking steps to chair, pt asymptomatic and BP maintaining SBP 130s-140s with activity the rest of session. HR 70s-80s    Exercises     Shoulder Instructions      Home Living Family/patient expects to be discharged to:: Private residence Living Arrangements: Alone Available Help at Discharge: Family;Personal care attendant;Available PRN/intermittently Type of Home: Independent living facility Home Access: Level entry;Elevator     Home Layout: One level     Bathroom Shower/Tub: Walk-in shower (with small ledge to enter)   Bathroom Toilet: Handicapped height Bathroom Accessibility: Yes How Accessible: Accessible via walker Home Equipment: Rollator (4 wheels);Transport chair   Additional Comments: lives in apartment at [facility]      Prior Functioning/Environment Prior Level of Function : Needs assist       Physical Assist : ADLs (physical) (asisst for getting in and out of walk in shower)   ADLs (physical): IADLs (caregivers completed IADLs) Mobility Comments: ambulatory with rollator; walks around apartment without issue, but typically pushed to dining hall and for community ambulation in transport chair ADLs Comments: pt reports mod indep with majority of ADLs, intermittent assist from PCA; assist for bathing. pt performs light ADL tasks (typically eats breakfast in her room)        OT Problem List: Decreased strength;Decreased activity tolerance;Decreased safety awareness      OT Treatment/Interventions: Self-care/ADL training;Energy conservation;Patient/family education;Therapeutic exercise;Therapeutic activities    OT Goals(Current goals can be found in the care plan section) Acute Rehab OT Goals OT Goal Formulation: With patient Time For Goal Achievement: 12/01/22 Potential to Achieve Goals: Good  OT Frequency: Min 1X/week    Co-evaluation               AM-PAC OT "6 Clicks" Daily Activity     Outcome Measure Help from another person eating meals?: A Little Help from another person taking care of personal grooming?: A Little Help from another person toileting, which includes using toliet, bedpan, or urinal?: A Little Help from another person bathing (including washing, rinsing, drying)?: A Little Help from another person to put on and taking off regular upper body clothing?: A Little Help from another person to put on and taking off regular lower body clothing?: A Little 6 Click Score: 18   End of Session Equipment Utilized During Treatment: Gait belt;Rolling walker (2 wheels) Nurse Communication: Mobility status  Activity Tolerance: Patient tolerated treatment well Patient left: in chair;with call bell/phone within reach  OT Visit Diagnosis: Muscle weakness (generalized) (M62.81);Unsteadiness on feet (R26.81)                Time: 4132-4401 OT Time Calculation (min): 26 min Charges:  OT General Charges $OT Visit: 1 Visit OT Evaluation $OT Eval Moderate Complexity: 1 Mod OT Treatments $Self Care/Home Management : 8-22 mins  Governor Specking OTL  Denice Paradise 11/17/2022, 12:30 PM

## 2022-11-17 NOTE — Progress Notes (Signed)
  Echocardiogram 2D Echocardiogram has been performed.  Valerie Barker 11/17/2022, 12:09 PM

## 2022-11-17 NOTE — TOC CM/SW Note (Signed)
Transition of Care [facility]) - Inpatient Brief Assessment   Patient Details  Name: Valerie Barker MRN: [id number] Date of Birth: [birth year]  [facility] CM/SW Contact:    Gala Lewandowsky, RN Phone Number: 11/17/2022, 1:08 PM   Clinical Narrative: Patient presented for near syncopal event. Patient is from [facility] with caregiver support. Patient receives PT/OT with [facility] and would like to continue with the services. Case Manager will need to fax orders to [phone] once the orders are completed. Caregiver states she can provide transportation home once stable. Case Manager will continue to follow for transition of care needs as the patient progresses.    Transition of Care Asessment: Insurance and Status: Insurance coverage has been reviewed Patient has primary care physician: Yes  Prior/Current Home Services: No current home services Social Determinants of Health Reivew: SDOH reviewed no interventions necessary Readmission risk has been reviewed: Yes Transition of care needs: transition of care needs identified, TOC will continue to follow

## 2022-11-17 NOTE — Progress Notes (Addendum)
Rounding Note    Patient Name: Valerie Barker Date of Encounter: 11/17/2022  [facility] Cardiologist: Nicki Guadalajara, MD   Subjective   No complaints this morning.   Inpatient Medications    Scheduled Meds:  aspirin  325 mg Oral Daily   atorvastatin  10 mg Oral Daily   donepezil  10 mg Oral QHS   enoxaparin (LOVENOX) injection  30 mg Subcutaneous Q24H   hydrochlorothiazide  12.5 mg Oral Daily   rOPINIRole  2 mg Oral BID   sodium chloride flush  3 mL Intravenous Q12H   Continuous Infusions:  PRN Meds: acetaminophen **OR** acetaminophen, polyethylene glycol   Vital Signs    Vitals:   11/17/22 0623 11/17/22 0625 11/17/22 0627 11/17/22 0629  BP: (!) 113/50 (!) 116/55 118/63 121/64  Pulse: (!) 57 (!) 53 73 (!) 43  Resp:      Temp:      TempSrc:      SpO2: 94% 92% (!) 85% 91%  Weight:      Height:        Intake/Output Summary (Last 24 hours) at 11/17/2022 0937 Last data filed at 11/16/2022 2100 Gross per 24 hour  Intake 780 ml  Output --  Net 780 ml      11/17/2022    3:53 AM 11/16/2022    8:49 AM 11/02/2022   11:44 AM  Last 3 Weights  Weight (lbs) 126 lb 125 lb 130 lb 1.1 oz  Weight (kg) 57.153 kg 56.7 kg 59 kg      Telemetry    Sinus Rhythm, Bigeminy, PACs, Short run of atrial tach - Personally Reviewed  ECG    Sinus Rhythm, 60bpm PVCs - Personally Reviewed  Physical Exam   GEN: No acute distress.   Neck: No JVD Cardiac: RRR, soft systolic murmur, no rubs, or gallops.  Respiratory: Clear to auscultation bilaterally. GI: Soft, nontender, non-distended  MS: No edema; No deformity. Neuro:  Nonfocal  Psych: Normal affect   Labs    High Sensitivity Troponin:   Recent Labs  Lab 11/02/22 1613 11/16/22 0850 11/16/22 1050 11/16/22 1452 11/16/22 1734  TROPONINIHS 28* 36* 151* 117* 101*     Chemistry Recent Labs  Lab 11/16/22 0850 11/16/22 1734 11/17/22 0754  NA 137  --  138  K 4.2  --  3.4*  CL 102  --  104  CO2 23   --  26  GLUCOSE 100*  --  97  BUN 26*  --  19  CREATININE 1.20*  --  1.06*  CALCIUM 9.7  --  8.9  PROT  --  6.4* 6.2*  ALBUMIN  --  3.6 3.4*  AST  --  34 31  ALT  --  27 26  ALKPHOS  --  47 50  BILITOT  --  0.4 0.5  GFRNONAA 41*  --  48*  ANIONGAP 12  --  8    Lipids No results for input(s): "CHOL", "TRIG", "HDL", "LABVLDL", "LDLCALC", "CHOLHDL" in the last 168 hours.  Hematology Recent Labs  Lab 11/16/22 0850 11/17/22 0754  WBC 6.9 5.8  RBC 4.41 4.16  HGB 13.3 12.2  HCT 39.9 37.1  MCV 90.5 89.2  MCH 30.2 29.3  MCHC 33.3 32.9  RDW 14.2 14.3  PLT 192 158   Thyroid No results for input(s): "TSH", "FREET4" in the last 168 hours.  BNPNo results for input(s): "BNP", "PROBNP" in the last 168 hours.  DDimer No results for input(s): "DDIMER" in  the last 168 hours.   Radiology    DG Pelvis 1-2 Views  Result Date: 11/16/2022 CLINICAL DATA:  Pain after fall EXAM: PELVIS - 1 VIEW COMPARISON:  11/15/2021 FINDINGS: Osteopenia there is some concentric joint space loss of both hips. Mild degenerative changes of the sacroiliac joints. No fracture or dislocation. Hyperostosis. Overlapping cardiac leads. Diffuse colonic stool. Presumed vascular calcifications in the pelvis. If there is further concern of subtle nondisplaced injury, additional cross-sectional imaging study may be useful in the setting of osteopenia for higher sensitivity IMPRESSION: Osteopenia.  Degenerative changes. Electronically Signed   By: Karen Kays M.D.   On: 11/16/2022 10:05   DG Chest Portable 1 View  Result Date: 11/16/2022 CLINICAL DATA:  Pain after fall EXAM: PORTABLE CHEST 1 VIEW COMPARISON:  X-ray 11/02/2022 FINDINGS: Stable borderline cardiopericardial silhouette. Calcified aorta. Chronic interstitial changes are again seen. No pneumothorax, effusion or edema. No consolidation. Overlapping cardiac leads. Calcific tendinitis along the right shoulder seen at the edge of the imaging field. The right inferior  costophrenic angle is clipped off the edge of the film. IMPRESSION: Enlarged heart.  Chronic interstitial lung changes. Electronically Signed   By: Karen Kays M.D.   On: 11/16/2022 10:03   CT Lumbar Spine Wo Contrast  Result Date: 11/16/2022 CLINICAL DATA:  Back trauma, no prior imaging (Age >= 16y) EXAM: CT LUMBAR SPINE WITHOUT CONTRAST TECHNIQUE: Multidetector CT imaging of the lumbar spine was performed without intravenous contrast administration. Multiplanar CT image reconstructions were also generated. RADIATION DOSE REDUCTION: This exam was performed according to the departmental dose-optimization program which includes automated exposure control, adjustment of the mA and/or kV according to patient size and/or use of iterative reconstruction technique. COMPARISON:  None Available. FINDINGS: Segmentation: 5 lumbar type vertebrae. Alignment: Grade 1 anterolisthesis of L3 on L4 and L4-L5. Vertebrae: No acute fracture or focal pathologic process. Paraspinal and other soft tissues: Aortic atherosclerotic calcifications. Disc levels: There is a least moderate spinal canal narrowing at L4-L5 (series 7, image 93). IMPRESSION: 1. No acute fracture or traumatic listhesis. 2. At least moderate spinal canal narrowing at L4-L5. Aortic Atherosclerosis (ICD10-I70.0). Electronically Signed   By: Lorenza Cambridge M.D.   On: 11/16/2022 09:51   CT Head Wo Contrast  Result Date: 11/16/2022 CLINICAL DATA:  Head trauma, minor (Age >= 65y); Neck trauma (Age >= 65y) EXAM: CT HEAD WITHOUT CONTRAST CT CERVICAL SPINE WITHOUT CONTRAST TECHNIQUE: Multidetector CT imaging of the head and cervical spine was performed following the standard protocol without intravenous contrast. Multiplanar CT image reconstructions of the cervical spine were also generated. RADIATION DOSE REDUCTION: This exam was performed according to the departmental dose-optimization program which includes automated exposure control, adjustment of the mA and/or kV  according to patient size and/or use of iterative reconstruction technique. COMPARISON:  CT head and C Spine 11/02/22 FINDINGS: CT HEAD FINDINGS Brain: No evidence of acute infarction, hemorrhage, hydrocephalus, extra-axial collection or mass lesion/mass effect. There is sequela moderate to severe chronic microvascular ischemic change. Vascular: No hyperdense vessel or unexpected calcification. Skull: Normal. Negative for fracture or focal lesion. Sinuses/Orbits: No middle ear or mastoid effusion. Paranasal sinuses are notable for mild mucosal thickening in the left sphenoid sinus. Bilateral lens replacement. Orbits are otherwise unremarkable. Other: None. CT CERVICAL SPINE FINDINGS Alignment: Grade 1 anterolisthesis of C3 on C4 and C4 on C5. Skull base and vertebrae: No acute fracture. No primary bone lesion or focal pathologic process. Severe degenerative changes of the left-sided facet joint at C3-C4 (series  9, image 40). Soft tissues and spinal canal: No prevertebral fluid or swelling. No visible canal hematoma. Disc levels:  No CT evidence of high-grade spinal canal stenosis. Upper chest: Negative. Other: No IMPRESSION: 1. No acute intracranial abnormality. 2. No acute fracture or traumatic subluxation of the cervical spine. 3. Severe degenerative changes of the left-sided facet joint at C3-C4. Electronically Signed   By: Lorenza Cambridge M.D.   On: 11/16/2022 09:40   CT Cervical Spine Wo Contrast  Result Date: 11/16/2022 CLINICAL DATA:  Head trauma, minor (Age >= 65y); Neck trauma (Age >= 65y) EXAM: CT HEAD WITHOUT CONTRAST CT CERVICAL SPINE WITHOUT CONTRAST TECHNIQUE: Multidetector CT imaging of the head and cervical spine was performed following the standard protocol without intravenous contrast. Multiplanar CT image reconstructions of the cervical spine were also generated. RADIATION DOSE REDUCTION: This exam was performed according to the departmental dose-optimization program which includes automated  exposure control, adjustment of the mA and/or kV according to patient size and/or use of iterative reconstruction technique. COMPARISON:  CT head and C Spine 11/02/22 FINDINGS: CT HEAD FINDINGS Brain: No evidence of acute infarction, hemorrhage, hydrocephalus, extra-axial collection or mass lesion/mass effect. There is sequela moderate to severe chronic microvascular ischemic change. Vascular: No hyperdense vessel or unexpected calcification. Skull: Normal. Negative for fracture or focal lesion. Sinuses/Orbits: No middle ear or mastoid effusion. Paranasal sinuses are notable for mild mucosal thickening in the left sphenoid sinus. Bilateral lens replacement. Orbits are otherwise unremarkable. Other: None. CT CERVICAL SPINE FINDINGS Alignment: Grade 1 anterolisthesis of C3 on C4 and C4 on C5. Skull base and vertebrae: No acute fracture. No primary bone lesion or focal pathologic process. Severe degenerative changes of the left-sided facet joint at C3-C4 (series 9, image 40). Soft tissues and spinal canal: No prevertebral fluid or swelling. No visible canal hematoma. Disc levels:  No CT evidence of high-grade spinal canal stenosis. Upper chest: Negative. Other: No IMPRESSION: 1. No acute intracranial abnormality. 2. No acute fracture or traumatic subluxation of the cervical spine. 3. Severe degenerative changes of the left-sided facet joint at C3-C4. Electronically Signed   By: Lorenza Cambridge M.D.   On: 11/16/2022 09:40    Cardiac Studies   Echo: pending  Patient Profile     89 y.o. female with a hx of TIA/lacunar infarcts, hypertension, hyperlipidemia, frequent PACs, bradycardia (no beta-blocker), moderate TR who is being seen 11/16/2022 for the evaluation of syncope/elevated troponins at the request of Dr. Alinda Money.   Assessment & Plan    Elevated troponins Fall -- had a mechanical fall from her bed while reaching for her walker. Imaging on admission without acute findings. Orthostatics were negative. hsTn  151>>117>>101. No chest pain. Given her advance with low flat trend and no angina she will be treated conservatively.  -- echo pending  PACs, PVCs -- episodes of bigeminy, PACs and PVCs on telemetry -- with her baseline bradycardia, more freq falls and age hesitant to add BB therapy at this time. She appears asymptomatic   Moderate TR Mild MR -- soft systolic murmur -- repeat echo pending  For questions or updates, please contact [facility] Please consult [email or web address] for contact info under        Signed, Laverda Page, NP  11/17/2022, 9:37 AM     Patient seen and examined. Agree with assessment and plan.  Patient feels well.  She continues to have occasional to frequent PACs.  Orthostatics.  Conservative strategy.  Echo completed.  Final results pending.  Patient  has been previously documented to have hyperdynamic LV function.  Dissipate probable discharge later today.  May consider outpatient 2-week Zio patch monitor.   Lennette Bihari, MD, [facility] 11/17/2022 12:19 PM

## 2022-11-17 NOTE — Evaluation (Signed)
Physical Therapy Evaluation & Discharge Patient Details Name: Valerie Barker MRN: [id number] DOB: Aug 27, 1922 Today's Date: 11/17/2022  History of Present Illness  Pt is a 89 y.o. female admitted from [facility] on 11/16/22 after unwitnessed fall, concern for syncope. Head and neck imaging negative for acute abnormality. PMH includes HTN, IBS, stroke, TIA.   Clinical Impression  Patient evaluated by Physical Therapy with no further acute PT needs identified. PTA, pt household ambulator with rollator, pushed in transport chair to dining hall and for community mobility; pt lives alone at ILF, but has daily PCA assist for ADL/iADLs as needed. Today, pt moving well with rollator and intermittent standby assist for balance; pt denies lightheadedness, endorses chronic middle and upper back pain between shoulder blades, which is improved with heat. All education has been completed and the patient has no further questions. Pt would benefit from HHPT services at ILF if an option. Acute PT is signing off. Thank you for this referral.    Orthostatic BPs Sitting 142/96 (108)  Post-standing transfer to recliner 86/48 (62)  Sitting 5-min 131/82 (95)  Standing 145/67 (92)  Standing after 2-min 145/66 (89)  Post-ambulation 132/102 (116)   HR 70s-80s      If plan is discharge home, recommend the following: A little help with walking and/or transfers;A little help with bathing/dressing/bathroom;Assistance with cooking/housework;Assist for transportation;Direct supervision/assist for medications management;Direct supervision/assist for financial management   Can travel by private vehicle    Yes    Equipment Recommendations None recommended by PT  Recommendations for Other Services   Mobility Specialist   Functional Status Assessment       Precautions / Restrictions Precautions Precautions: Fall Restrictions Weight Bearing Restrictions: No      Mobility  Bed Mobility Overal bed  mobility: Needs Assistance Bed Mobility: Supine to Sit     Supine to sit: Min assist     General bed mobility comments: minA for HHA to elevate trunk    Transfers Overall transfer level: Needs assistance Equipment used: Rollator (4 wheels) Transfers: Sit to/from Stand, Bed to chair/wheelchair/BSC Sit to Stand: Contact guard assist, Supervision   Step pivot transfers: Contact guard assist       General transfer comment: initial CGA for sit>stand and pivotal steps to recliner with rollator, verbal cues to lock rollator; multiple sit<>stands from recliner to rollator at supervision-level    Ambulation/Gait Ambulation/Gait assistance: Contact guard assist, Supervision Gait Distance (Feet): 50 Feet Assistive device: Rollator (4 wheels) Gait Pattern/deviations: Step-through pattern, Decreased stride length, Trunk flexed Gait velocity: Decreased     General Gait Details: slow, guarded gait with rollator and intermittent CGA, especially when taking backwards steps to sit downs (pt states, "I'm not good at walking backwards") with self-corrected instability  Stairs            Wheelchair Mobility     Tilt Bed    Modified Rankin (Stroke Patients Only)       Balance Overall balance assessment: Needs assistance Sitting-balance support: No upper extremity supported, Feet supported Sitting balance-Leahy Scale: Fair     Standing balance support: No upper extremity supported, During functional activity Standing balance-Leahy Scale: Poor Standing balance comment: can static stand without UE support but bracing BLEs against recliner; static and dynamic stability improved with walker                             Pertinent Vitals/Pain Pain Assessment Pain Assessment: Faces Faces Pain Scale:  Hurts a little bit Pain Location: mid back and between shoulder blades Pain Descriptors / Indicators: Constant, Sore Pain Intervention(s): Monitored during session, Heat  applied (heat per pt request)    Home Living Family/patient expects to be discharged to:: Private residence Living Arrangements: Alone Available Help at Discharge: Family;Personal care attendant;Available PRN/intermittently Type of Home: Independent living facility Home Access: Level entry;Elevator       Home Layout: One level Home Equipment: Rollator (4 wheels);Transport chair Additional Comments: lives in apartment at [facility]    Prior Function Prior Level of Function : Needs assist             Mobility Comments: ambulatory with rollator; walks around apartment without issue, but typically pushed to dining hall and for community ambulation in transport chair ADLs Comments: pt reports mod indep with majority of ADLs, intermittent assist from PCA; assist for bathing. pt performs light ADL tasks (typically eats breakfast in her room)     Extremity/Trunk Assessment   Upper Extremity Assessment Upper Extremity Assessment: Generalized weakness    Lower Extremity Assessment Lower Extremity Assessment: Generalized weakness    Cervical / Trunk Assessment Cervical / Trunk Assessment: Kyphotic  Communication   Communication Communication: No apparent difficulties  Cognition Arousal: Alert Behavior During Therapy: WFL for tasks assessed/performed Overall Cognitive Status: History of cognitive impairments - at baseline                                 General Comments: h/o memory impairments. pt pleasant and agreeable, following majority of simple commands well; short-term memory deficits noted, pt aware        General Comments General comments (skin integrity, edema, etc.): educ re: POC, activity recommendations, fall risk reduction. 1x bout of BP down to 86/48 (MAP 62) after taking steps to chair, pt asymptomatic and BP maintaining SBP 130s-140s with activity the rest of session. HR 70s-80s    Exercises     Assessment/Plan    PT Assessment All  further PT needs can be met in the next venue of care  PT Problem List         PT Treatment Interventions      PT Goals (Current goals can be found in the Care Plan section)  Acute Rehab PT Goals Patient Stated Goal: return home today PT Goal Formulation: All assessment and education complete, DC therapy    Frequency       Co-evaluation               AM-PAC PT "6 Clicks" Mobility  Outcome Measure Help needed turning from your back to your side while in a flat bed without using bedrails?: None Help needed moving from lying on your back to sitting on the side of a flat bed without using bedrails?: A Little Help needed moving to and from a bed to a chair (including a wheelchair)?: A Little Help needed standing up from a chair using your arms (e.g., wheelchair or bedside chair)?: A Little Help needed to walk in hospital room?: A Little Help needed climbing 3-5 steps with a railing? : A Lot 6 Click Score: 18    End of Session Equipment Utilized During Treatment: Gait belt Activity Tolerance: Patient tolerated treatment well Patient left: in chair;with call bell/phone within reach;with chair alarm set;with family/visitor present Nurse Communication: Mobility status PT Visit Diagnosis: Other abnormalities of gait and mobility (R26.89)    Time: 3474-2595 PT Time  Calculation (min) (ACUTE ONLY): 30 min   Charges:   PT Evaluation $PT Eval Moderate Complexity: 1 Mod PT Treatments $Therapeutic Activity: 8-22 mins PT General Charges $$ ACUTE PT VISIT: 1 Visit    Ina Homes, PT, DPT Acute Rehabilitation Services  Personal: Secure Chat Rehab Office: [phone]  Malachy Chamber 11/17/2022, 9:20 AM

## 2022-11-17 NOTE — Progress Notes (Signed)
PROGRESS NOTE    Valerie Barker  BMW:[id number] DOB: 1922/07/03 DOA: 11/16/2022 PCP: Merri Brunette, MD  Outpatient Specialists:     Brief Narrative:  Patient is a 89 year old female past medical history significant for memory impairment, hypertension, hyperlipidemia, stroke, restless leg syndrome, irritable bowel syndrome and incontinence.  Patient was admitted with recurrent falls versus syncope.  Patient is a resident of an independent living facility (possible).  On presentation, Vital signs in the ED notable for initial heart rate read in the 120s but since then has been in the 50s to 70s. Blood pressure in the 120s to 130s systolic. Respiratory rate in the 20s to 30s. Lab workup included BMP with creatinine mildly elevated 1.2 from baseline of 1. CBC within normal limits. Troponin uptrending from 36-1 51. Imaging studies included chest x-ray with cardiomegaly and chronic interstitial lung changes. Pelvis x-ray showed no acute normality. CT head showed no acute abnormality. CT C-spine showed no acute normality but did show degenerative disease. CT L-spine showed no acute abnormality but did show moderate stenosis at L4-L5. Patient received Tylenol and aspirin in the ED. Cardiology was consulted given possible syncope and unusual ECG with supraventricular bigeminy.   Cardiology team was consulted to assist with patient's management.  PT OT also consulted.  Likely discharge back to the facility tomorrow with PT OT follow-up.   Assessment & Plan:   Principal Problem:   Near syncope Active Problems:   HTN (hypertension)   Restless leg syndrome   Hyperlipidemia   History of CVA (cerebrovascular accident)   Memory difficulty   Elevated troponin   Fall Syncope > Unwitnessed fall though concern for syncope.  Unclear of the events surrounding the fall.  Does walk with a walker at baseline. > Has had intermittent chest pain and lightheadedness for several months. > Troponin has  remained flat 1 51-1 117-101.  > ECG with supraventricular bigeminy.   -Cardiology team consulted to assist with management.  Cardiology has cleared patient for discharge. -Continue to monitor on telemetry overnight -Patient is not orthostatic. -Echocardiogram is as documented below. -PT OT follow-up on discharge. -Cautious hydration.  No recent urinalysis.  Will check urinalysis (specific gravity). - Trend renal function and electrolytes -Fall precautions. - Supportive care   Elevated troponin > Current suspicion is for demand ischemia.  Does have some intermittent episodes of lightheadedness and chest pain for the last several months. > Could be elevated due to arrhythmia given ECG changes as below.  Cardiology consulted in the ED. - On telemetry as above - Trend troponin - Appreciate cardiology recommendations   Elevated creatinine > Creatinine elevated to 1.2 from baseline of around 1.  Not true AKI but possibly some dehydration which could be contributing to his symptoms if there was a syncopal event. - 500 cc IV fluid bolus - Trend renal function and electrolytes 11/17/2022: Serum creatinine is improving.  Suspect mild volume depletion.   Abnormal ECG > ECG noted to have supraventricular bigeminy and 93 bpm.  Cardiology consulted in the ED. - Appreciate cardiology recommendations 11/17/2022: Cardiology is directing care.  Cardiology has cleared patient for discharge.   Hypertension - Continue home hydrochlorothiazide 11/17/2022: Blood pressure control has improved significantly.   Hyperlipidemia - Continue home atorvastatin   Memory impairment - Continue home donepezil   History of CVA - Continue atorvastatin - Working with pharmacy to determine if still taking Plavix   RLS - Continue home ropinirole     DVT prophylaxis: Subcutaneous Lovenox Code Status: DNR/DNI  Family Communication: Care Disposition Plan: Back to the assisted living facility tomorrow with PT  OT.   Consultants:  Cardiology  Procedures:  Echocardiogram revealed: 1. Left ventricular ejection fraction, by estimation, is 60 to 65%. The  left ventricle has normal function. The left ventricle has no regional  wall motion abnormalities. Left ventricular diastolic parameters are  consistent with Grade I diastolic  dysfunction (impaired relaxation).   2. Right ventricular systolic function is normal. The right ventricular  size is mildly enlarged. There is mildly elevated pulmonary artery  systolic pressure. The estimated right ventricular systolic pressure is  44.8 mmHg.   3. Left atrial size was mildly dilated.   4. Right atrial size was mildly dilated.   5. The mitral valve is normal in structure. Trivial mitral valve  regurgitation. No evidence of mitral stenosis.   6. The aortic valve is tricuspid. There is mild calcification of the  aortic valve. Aortic valve regurgitation is not visualized. No aortic  stenosis is present.   7. The inferior vena cava is normal in size with greater than 50%  respiratory variability, suggesting right atrial pressure of 3 mmHg.   Antimicrobials:  None.   Subjective: No new complaints.  Objective: Vitals:   11/17/22 0625 11/17/22 0627 11/17/22 0629 11/17/22 0806  BP: (!) 116/55 118/63 121/64 (!) 142/94  Pulse: (!) 53 73 (!) 43   Resp:      Temp:      TempSrc:      SpO2: 92% (!) 85% 91% 97%  Weight:      Height:        Intake/Output Summary (Last 24 hours) at 11/17/2022 1649 Last data filed at 11/16/2022 2100 Gross per 24 hour  Intake 280 ml  Output --  Net 280 ml   Filed Weights   11/16/22 0849 11/17/22 0353  Weight: 56.7 kg 57.2 kg    Examination:  General exam: Appears calm and comfortable  Respiratory system: Clear to auscultation.  Cardiovascular system: S1 & S2 heard Gastrointestinal system: Abdomen is soft and nontender.  Central nervous system: Alert and oriented.  Moves all extremities.  Memory  deficit. Extremities: No leg edema.  Data Reviewed: I have personally reviewed following labs and imaging studies  CBC: Recent Labs  Lab 11/16/22 0850 11/17/22 0754  WBC 6.9 5.8  HGB 13.3 12.2  HCT 39.9 37.1  MCV 90.5 89.2  PLT 192 158   Basic Metabolic Panel: Recent Labs  Lab 11/16/22 0850 11/17/22 0754  NA 137 138  K 4.2 3.4*  CL 102 104  CO2 23 26  GLUCOSE 100* 97  BUN 26* 19  CREATININE 1.20* 1.06*  CALCIUM 9.7 8.9   GFR: Estimated Creatinine Clearance: 23.4 mL/min (A) (by C-G formula based on SCr of 1.06 mg/dL (H)). Liver Function Tests: Recent Labs  Lab 11/16/22 1734 11/17/22 0754  AST 34 31  ALT 27 26  ALKPHOS 47 50  BILITOT 0.4 0.5  PROT 6.4* 6.2*  ALBUMIN 3.6 3.4*   No results for input(s): "LIPASE", "AMYLASE" in the last 168 hours. No results for input(s): "AMMONIA" in the last 168 hours. Coagulation Profile: No results for input(s): "INR", "PROTIME" in the last 168 hours. Cardiac Enzymes: No results for input(s): "CKTOTAL", "CKMB", "CKMBINDEX", "TROPONINI" in the last 168 hours. BNP (last 3 results) No results for input(s): "PROBNP" in the last 8760 hours. HbA1C: No results for input(s): "HGBA1C" in the last 72 hours. CBG: Recent Labs  Lab 11/17/22 0640  GLUCAP 90  Lipid Profile: No results for input(s): "CHOL", "HDL", "LDLCALC", "TRIG", "CHOLHDL", "LDLDIRECT" in the last 72 hours. Thyroid Function Tests: No results for input(s): "TSH", "T4TOTAL", "FREET4", "T3FREE", "THYROIDAB" in the last 72 hours. Anemia Panel: No results for input(s): "VITAMINB12", "FOLATE", "FERRITIN", "TIBC", "IRON", "RETICCTPCT" in the last 72 hours. Urine analysis:    Component Value Date/Time   COLORURINE YELLOW 04/17/2018 1340   APPEARANCEUR HAZY (A) 04/17/2018 1340   LABSPEC 1.009 04/17/2018 1340   PHURINE 7.0 04/17/2018 1340   GLUCOSEU NEGATIVE 04/17/2018 1340   HGBUR SMALL (A) 04/17/2018 1340   BILIRUBINUR NEGATIVE 04/17/2018 1340   KETONESUR  NEGATIVE 04/17/2018 1340   PROTEINUR NEGATIVE 04/17/2018 1340   UROBILINOGEN 0.2 04/27/2009 1643   NITRITE NEGATIVE 04/17/2018 1340   LEUKOCYTESUR TRACE (A) 04/17/2018 1340   Sepsis Labs: @LABRCNTIP (procalcitonin:4,lacticidven:4)  )No results found for this or any previous visit (from the past 240 hour(s)).       Radiology Studies: ECHOCARDIOGRAM COMPLETE  Result Date: 11/17/2022    ECHOCARDIOGRAM REPORT   Patient Name:   CHANIN CLOUGHERTY [facility] Date of Exam: 11/17/2022 Medical Rec #:  [id number]             Height:       59.0 in Accession #:    [id number]            Weight:       126.0 lb Date of Birth:  [birth year]             BSA:          1.516 m Patient Age:    89 years              BP:           127/100 mmHg Patient Gender: F                     HR:           63 bpm. Exam Location:  Inpatient Procedure: 2D Echo, Cardiac Doppler and Color Doppler Indications:    Syncope R55  History:        Patient has prior history of Echocardiogram examinations, most                 recent 05/17/2022. TIA and Stroke; Risk Factors:Hypertension,                 Dyslipidemia and Non-Smoker.  Sonographer:    Aron Baba Referring Phys: [id number] Cecille Po MELVIN  Sonographer Comments: Image acquisition challenging due to respiratory motion. IMPRESSIONS  1. Left ventricular ejection fraction, by estimation, is 60 to 65%. The left ventricle has normal function. The left ventricle has no regional wall motion abnormalities. Left ventricular diastolic parameters are consistent with Grade I diastolic dysfunction (impaired relaxation).  2. Right ventricular systolic function is normal. The right ventricular size is mildly enlarged. There is mildly elevated pulmonary artery systolic pressure. The estimated right ventricular systolic pressure is 44.8 mmHg.  3. Left atrial size was mildly dilated.  4. Right atrial size was mildly dilated.  5. The mitral valve is normal in structure. Trivial mitral valve regurgitation. No  evidence of mitral stenosis.  6. The aortic valve is tricuspid. There is mild calcification of the aortic valve. Aortic valve regurgitation is not visualized. No aortic stenosis is present.  7. The inferior vena cava is normal in size with greater than 50% respiratory variability, suggesting right atrial pressure of 3 mmHg. FINDINGS  Left Ventricle: Left ventricular  ejection fraction, by estimation, is 60 to 65%. The left ventricle has normal function. The left ventricle has no regional wall motion abnormalities. The left ventricular internal cavity size was normal in size. There is  no left ventricular hypertrophy. Left ventricular diastolic parameters are consistent with Grade I diastolic dysfunction (impaired relaxation). Right Ventricle: The right ventricular size is mildly enlarged. No increase in right ventricular wall thickness. Right ventricular systolic function is normal. There is mildly elevated pulmonary artery systolic pressure. The tricuspid regurgitant velocity is 2.95 m/s, and with an assumed right atrial pressure of 10 mmHg, the estimated right ventricular systolic pressure is 44.8 mmHg. Left Atrium: Left atrial size was mildly dilated. Right Atrium: Right atrial size was mildly dilated. Pericardium: There is no evidence of pericardial effusion. Mitral Valve: The mitral valve is normal in structure. Trivial mitral valve regurgitation. No evidence of mitral valve stenosis. Tricuspid Valve: The tricuspid valve is normal in structure. Tricuspid valve regurgitation is mild . No evidence of tricuspid stenosis. Aortic Valve: The aortic valve is tricuspid. There is mild calcification of the aortic valve. Aortic valve regurgitation is not visualized. No aortic stenosis is present. Pulmonic Valve: The pulmonic valve was normal in structure. Pulmonic valve regurgitation is trivial. No evidence of pulmonic stenosis. Aorta: The aortic root is normal in size and structure. Venous: The inferior vena cava is  normal in size with greater than 50% respiratory variability, suggesting right atrial pressure of 3 mmHg. IAS/Shunts: No atrial level shunt detected by color flow Doppler.  LEFT VENTRICLE PLAX 2D LVIDd:         3.30 cm   Diastology LVIDs:         2.30 cm   LV e' medial:    4.79 cm/s LV PW:         0.90 cm   LV E/e' medial:  18.5 LV IVS:        0.80 cm   LV e' lateral:   5.55 cm/s LVOT diam:     1.90 cm   LV E/e' lateral: 16.0 LV SV:         52 LV SV Index:   34 LVOT Area:     2.84 cm  RIGHT VENTRICLE RV S prime:     8.16 cm/s TAPSE (M-mode): 1.8 cm LEFT ATRIUM           Index        RIGHT ATRIUM           Index LA diam:      3.70 cm 2.44 cm/m   RA Area:     13.50 cm LA Vol (A2C): 32.4 ml 21.38 ml/m  RA Volume:   32.80 ml  21.64 ml/m LA Vol (A4C): 37.5 ml 24.74 ml/m  AORTIC VALVE             PULMONIC VALVE LVOT Vmax:   91.20 cm/s  PR End Diast Vel: 4.84 msec LVOT Vmean:  57.700 cm/s LVOT VTI:    0.184 m  AORTA Ao Root diam: 3.20 cm MITRAL VALVE               TRICUSPID VALVE MV Area (PHT): 3.42 cm    TR Peak grad:   34.8 mmHg MV Decel Time: 222 msec    TR Vmax:        295.00 cm/s MR Peak grad: 1.9 mmHg MR Vmax:      68.65 cm/s   SHUNTS MV E velocity: 88.80 cm/s  Systemic VTI:  0.18  m MV A velocity: 87.30 cm/s  Systemic Diam: 1.90 cm MV E/A ratio:  1.02 Arvilla Meres MD Electronically signed by Arvilla Meres MD Signature Date/Time: 11/17/2022/1:25:40 PM    Final    DG Pelvis 1-2 Views  Result Date: 11/16/2022 CLINICAL DATA:  Pain after fall EXAM: PELVIS - 1 VIEW COMPARISON:  11/15/2021 FINDINGS: Osteopenia there is some concentric joint space loss of both hips. Mild degenerative changes of the sacroiliac joints. No fracture or dislocation. Hyperostosis. Overlapping cardiac leads. Diffuse colonic stool. Presumed vascular calcifications in the pelvis. If there is further concern of subtle nondisplaced injury, additional cross-sectional imaging study may be useful in the setting of osteopenia for higher  sensitivity IMPRESSION: Osteopenia.  Degenerative changes. Electronically Signed   By: Karen Kays M.D.   On: 11/16/2022 10:05   DG Chest Portable 1 View  Result Date: 11/16/2022 CLINICAL DATA:  Pain after fall EXAM: PORTABLE CHEST 1 VIEW COMPARISON:  X-ray 11/02/2022 FINDINGS: Stable borderline cardiopericardial silhouette. Calcified aorta. Chronic interstitial changes are again seen. No pneumothorax, effusion or edema. No consolidation. Overlapping cardiac leads. Calcific tendinitis along the right shoulder seen at the edge of the imaging field. The right inferior costophrenic angle is clipped off the edge of the film. IMPRESSION: Enlarged heart.  Chronic interstitial lung changes. Electronically Signed   By: Karen Kays M.D.   On: 11/16/2022 10:03   CT Lumbar Spine Wo Contrast  Result Date: 11/16/2022 CLINICAL DATA:  Back trauma, no prior imaging (Age >= 16y) EXAM: CT LUMBAR SPINE WITHOUT CONTRAST TECHNIQUE: Multidetector CT imaging of the lumbar spine was performed without intravenous contrast administration. Multiplanar CT image reconstructions were also generated. RADIATION DOSE REDUCTION: This exam was performed according to the departmental dose-optimization program which includes automated exposure control, adjustment of the mA and/or kV according to patient size and/or use of iterative reconstruction technique. COMPARISON:  None Available. FINDINGS: Segmentation: 5 lumbar type vertebrae. Alignment: Grade 1 anterolisthesis of L3 on L4 and L4-L5. Vertebrae: No acute fracture or focal pathologic process. Paraspinal and other soft tissues: Aortic atherosclerotic calcifications. Disc levels: There is a least moderate spinal canal narrowing at L4-L5 (series 7, image 93). IMPRESSION: 1. No acute fracture or traumatic listhesis. 2. At least moderate spinal canal narrowing at L4-L5. Aortic Atherosclerosis (ICD10-I70.0). Electronically Signed   By: Lorenza Cambridge M.D.   On: 11/16/2022 09:51   CT Head Wo  Contrast  Result Date: 11/16/2022 CLINICAL DATA:  Head trauma, minor (Age >= 65y); Neck trauma (Age >= 65y) EXAM: CT HEAD WITHOUT CONTRAST CT CERVICAL SPINE WITHOUT CONTRAST TECHNIQUE: Multidetector CT imaging of the head and cervical spine was performed following the standard protocol without intravenous contrast. Multiplanar CT image reconstructions of the cervical spine were also generated. RADIATION DOSE REDUCTION: This exam was performed according to the departmental dose-optimization program which includes automated exposure control, adjustment of the mA and/or kV according to patient size and/or use of iterative reconstruction technique. COMPARISON:  CT head and C Spine 11/02/22 FINDINGS: CT HEAD FINDINGS Brain: No evidence of acute infarction, hemorrhage, hydrocephalus, extra-axial collection or mass lesion/mass effect. There is sequela moderate to severe chronic microvascular ischemic change. Vascular: No hyperdense vessel or unexpected calcification. Skull: Normal. Negative for fracture or focal lesion. Sinuses/Orbits: No middle ear or mastoid effusion. Paranasal sinuses are notable for mild mucosal thickening in the left sphenoid sinus. Bilateral lens replacement. Orbits are otherwise unremarkable. Other: None. CT CERVICAL SPINE FINDINGS Alignment: Grade 1 anterolisthesis of C3 on C4 and C4 on C5.  Skull base and vertebrae: No acute fracture. No primary bone lesion or focal pathologic process. Severe degenerative changes of the left-sided facet joint at C3-C4 (series 9, image 40). Soft tissues and spinal canal: No prevertebral fluid or swelling. No visible canal hematoma. Disc levels:  No CT evidence of high-grade spinal canal stenosis. Upper chest: Negative. Other: No IMPRESSION: 1. No acute intracranial abnormality. 2. No acute fracture or traumatic subluxation of the cervical spine. 3. Severe degenerative changes of the left-sided facet joint at C3-C4. Electronically Signed   By: Lorenza Cambridge M.D.    On: 11/16/2022 09:40   CT Cervical Spine Wo Contrast  Result Date: 11/16/2022 CLINICAL DATA:  Head trauma, minor (Age >= 65y); Neck trauma (Age >= 65y) EXAM: CT HEAD WITHOUT CONTRAST CT CERVICAL SPINE WITHOUT CONTRAST TECHNIQUE: Multidetector CT imaging of the head and cervical spine was performed following the standard protocol without intravenous contrast. Multiplanar CT image reconstructions of the cervical spine were also generated. RADIATION DOSE REDUCTION: This exam was performed according to the departmental dose-optimization program which includes automated exposure control, adjustment of the mA and/or kV according to patient size and/or use of iterative reconstruction technique. COMPARISON:  CT head and C Spine 11/02/22 FINDINGS: CT HEAD FINDINGS Brain: No evidence of acute infarction, hemorrhage, hydrocephalus, extra-axial collection or mass lesion/mass effect. There is sequela moderate to severe chronic microvascular ischemic change. Vascular: No hyperdense vessel or unexpected calcification. Skull: Normal. Negative for fracture or focal lesion. Sinuses/Orbits: No middle ear or mastoid effusion. Paranasal sinuses are notable for mild mucosal thickening in the left sphenoid sinus. Bilateral lens replacement. Orbits are otherwise unremarkable. Other: None. CT CERVICAL SPINE FINDINGS Alignment: Grade 1 anterolisthesis of C3 on C4 and C4 on C5. Skull base and vertebrae: No acute fracture. No primary bone lesion or focal pathologic process. Severe degenerative changes of the left-sided facet joint at C3-C4 (series 9, image 40). Soft tissues and spinal canal: No prevertebral fluid or swelling. No visible canal hematoma. Disc levels:  No CT evidence of high-grade spinal canal stenosis. Upper chest: Negative. Other: No IMPRESSION: 1. No acute intracranial abnormality. 2. No acute fracture or traumatic subluxation of the cervical spine. 3. Severe degenerative changes of the left-sided facet joint at C3-C4.  Electronically Signed   By: Lorenza Cambridge M.D.   On: 11/16/2022 09:40        Scheduled Meds:  aspirin EC  81 mg Oral Daily   atorvastatin  10 mg Oral Daily   donepezil  10 mg Oral QHS   enoxaparin (LOVENOX) injection  30 mg Subcutaneous Q24H   hydrochlorothiazide  12.5 mg Oral Daily   rOPINIRole  2 mg Oral BID   sodium chloride flush  3 mL Intravenous Q12H   Continuous Infusions:   LOS: 0 days    Time spent: 35 minutes.    Berton Mount, MD  [facility] Pager #: [phone] 7PM-7AM contact night coverage as above

## 2022-11-17 NOTE — Plan of Care (Signed)
  Problem: Cardiac: Goal: Will achieve and/or maintain adequate cardiac output Outcome: Progressing   Problem: Clinical Measurements: Goal: Ability to maintain clinical measurements within normal limits will improve Outcome: Progressing Goal: Will remain free from infection Outcome: Progressing Goal: Diagnostic test results will improve Outcome: Progressing Goal: Respiratory complications will improve Outcome: Progressing Goal: Cardiovascular complication will be avoided Outcome: Progressing   Problem: Activity: Goal: Risk for activity intolerance will decrease Outcome: Progressing   Problem: Pain Managment: Goal: General experience of comfort will improve Outcome: Progressing   Problem: Safety: Goal: Ability to remain free from injury will improve Outcome: Progressing   Problem: Skin Integrity: Goal: Risk for impaired skin integrity will decrease Outcome: Progressing

## 2022-11-17 NOTE — Progress Notes (Unsigned)
Enrolled patient for a 14 day Zio XT monitor to be mailed to patients home  [place] to read

## 2022-11-18 DIAGNOSIS — R55 Syncope and collapse: Secondary | ICD-10-CM | POA: Diagnosis not present

## 2022-11-18 DIAGNOSIS — R7989 Other specified abnormal findings of blood chemistry: Secondary | ICD-10-CM | POA: Diagnosis not present

## 2022-11-18 DIAGNOSIS — Z7902 Long term (current) use of antithrombotics/antiplatelets: Secondary | ICD-10-CM | POA: Diagnosis not present

## 2022-11-18 DIAGNOSIS — I119 Hypertensive heart disease without heart failure: Secondary | ICD-10-CM | POA: Diagnosis not present

## 2022-11-18 DIAGNOSIS — Z79899 Other long term (current) drug therapy: Secondary | ICD-10-CM | POA: Diagnosis not present

## 2022-11-18 DIAGNOSIS — Z8673 Personal history of transient ischemic attack (TIA), and cerebral infarction without residual deficits: Secondary | ICD-10-CM | POA: Diagnosis not present

## 2022-11-18 LAB — GLUCOSE, CAPILLARY: Glucose-Capillary: 108 mg/dL — ABNORMAL HIGH (ref 70–99)

## 2022-11-18 MED ORDER — POLYETHYLENE GLYCOL 3350 17 G PO PACK: 17.0000 g | PACK | Freq: Every day | ORAL | 0 refills | Status: DC | PRN

## 2022-11-18 MED ORDER — ASPIRIN 81 MG PO TBEC: 81.0000 mg | DELAYED_RELEASE_TABLET | Freq: Every day | ORAL | 12 refills | Status: DC

## 2022-11-18 MED ORDER — HYDROCHLOROTHIAZIDE 12.5 MG PO TABS: 12.5000 mg | ORAL_TABLET | Freq: Every day | ORAL | 1 refills | Status: DC

## 2022-11-18 MED ORDER — ATORVASTATIN CALCIUM 10 MG PO TABS: 10.0000 mg | ORAL_TABLET | Freq: Every day | ORAL | 1 refills | Status: DC

## 2022-11-18 NOTE — TOC CAGE-AID Note (Signed)
Transition of Care [facility]) - CAGE-AID Screening   Patient Details  Name: Valerie Barker MRN: [id number] Date of Birth: September 30, 1922  Transition of [facility] CM/SW Contact:    [name], RN Phone Number: 11/18/2022, 5:47 AM   Clinical Narrative:  Patient denies use of alcohol and illicit substances. Resources not given at this time.  CAGE-AID Screening:    Have You Ever Felt You Ought to Cut Down on Your Drinking or Drug Use?: No Have People Annoyed You By Critizing Your Drinking Or Drug Use?: No Have You Felt Bad Or Guilty About Your Drinking Or Drug Use?: No Have You Ever Had a Drink or Used Drugs First Thing In The Morning to Steady Your Nerves or to Get Rid of a Hangover?: No CAGE-AID Score: 0  Substance Abuse Education Offered: No

## 2022-11-18 NOTE — Progress Notes (Signed)
Patient woke up SOB with sharp pain in left shoulder and mid abdominal area. BP 124/65, HR 71, and 2 L [place] applied. Dr. Lendell Caprice notified and EKG completed.

## 2022-11-18 NOTE — Progress Notes (Signed)
Mobility Specialist Progress Note:    11/18/22 1508  Mobility  Activity Ambulated with assistance in hallway  Level of Assistance Contact guard assist, steadying assist  Assistive Device Front wheel walker  Distance Ambulated (ft) 300 ft  Activity Response Tolerated well  Mobility Referral Yes  $Mobility charge 1 Mobility  Mobility Specialist Start Time (ACUTE ONLY) 1425  Mobility Specialist Stop Time (ACUTE ONLY) 1439  Mobility Specialist Time Calculation (min) (ACUTE ONLY) 14 min   Pt received in chair agreeable to ambulate. Pt required no physical assistance throughout session. Prior to ambulating in the halls pt requested to use the BR, void complete, pericare performed independently. C/o mild back pain otherwise no c/o. Assisted back to chair post ambulation. Call bell and personal belongings in reach. All needs met.  Thompson Grayer Mobility Specialist  Please contact vis Secure Chat or  Rehab Office [phone]

## 2022-11-18 NOTE — Care Management (Signed)
11-18-22 1043 HH orders submitted to [facility] for PT/OT. No further needs identified at this time.

## 2022-11-18 NOTE — Progress Notes (Signed)
Occupational Therapy Treatment Patient Details Name: Infiniti Menaker MRN: [id number] DOB: 09-05-1922 Today's Date: 11/18/2022   History of present illness Pt is a 89 y.o. female admitted from [facility] on 11/16/22 after unwitnessed fall, concern for syncope. Head and neck imaging negative for acute abnormality. PMH includes HTN, IBS, stroke, TIA.   OT comments   Patient sitting EOB upon arrival into room.  Patient completed sit to stand from EOB with CGA with verbal cues to push up from seated surface. completed functional mobility and transfer in room with CGA using RW and min A for clothing mgmt for toileting task and for sit to stand from toilet 2/2 lower commode but able to use grab bar to help assist self to standing position. Patient completed UB grooming task at sink or oral hygiene and hand hygiene with CGA.  Patient completed sit to supine in bed with min A  for lifting Les into bed. Patient would benefit from [facility] to address functional deficits in order for patient to increase overall safety and Independent in AD       If plan is discharge home, recommend the following:  Assist for transportation;Assistance with cooking/housework;Direct supervision/assist for medications management;Direct supervision/assist for financial management;A little help with bathing/dressing/bathroom   Equipment Recommendations  None recommended by OT    Recommendations for Other Services      Precautions / Restrictions Precautions Precautions: Fall Restrictions Weight Bearing Restrictions: No       Mobility Bed Mobility Overal bed mobility: Needs Assistance                  Transfers   Equipment used: Rolling walker (2 wheels) Transfers: Sit to/from Stand, Bed to chair/wheelchair/BSC Sit to Stand: Contact guard assist, Supervision     Step pivot transfers: Contact guard assist           Balance Overall balance assessment: Needs assistance Sitting-balance support: No  upper extremity supported, Feet supported Sitting balance-Leahy Scale: Fair                                     ADL either performed or assessed with clinical judgement   ADL Overall ADL's : Needs assistance/impaired Eating/Feeding: Set up;Sitting   Grooming: Wash/dry hands;Contact guard assist;Standing                   Toilet Transfer: Contact guard assist;Rolling walker (2 wheels)   Toileting- Clothing Manipulation and Hygiene: Minimal assistance Toileting - Clothing Manipulation Details (indicate cue type and reason):  (to assit with donning brieffrom thighs to waist level)     Functional mobility during ADLs: Contact guard assist;Rolling walker (2 wheels)      Extremity/Trunk Assessment Upper Extremity Assessment Upper Extremity Assessment: Generalized weakness            Vision       Perception     Praxis      Cognition Arousal: Alert Behavior During Therapy: WFL for tasks assessed/performed Overall Cognitive Status: History of cognitive impairments - at baseline                                 General Comments: h/o memory impairments. pt pleasant and agreeable, following majority of simple commands well; short-term memory deficits noted, pt aware        Exercises  Shoulder Instructions       General Comments      Pertinent Vitals/ Pain       Pain Assessment Pain Assessment: 0-10 Pain Score: 4  Faces Pain Scale: Hurts little more Pain Location: mid back and between shoulder blades Pain Descriptors / Indicators: Constant, Sore Pain Intervention(s): Monitored during session  Home Living                                          Prior Functioning/Environment              Frequency  Min 1X/week        Progress Toward Goals  OT Goals(current goals can now be found in the care plan section)  Progress towards OT goals: Progressing toward goals  Acute Rehab OT Goals OT Goal  Formulation: With patient Time For Goal Achievement: 12/01/22 Potential to Achieve Goals: Good ADL Goals Pt Will Perform Lower Body Bathing: with supervision Pt Will Perform Upper Body Dressing: Independently Pt Will Transfer to Toilet: with modified independence Pt Will Perform Toileting - Clothing Manipulation and hygiene: with supervision  Plan      Co-evaluation                 AM-PAC OT "6 Clicks" Daily Activity     Outcome Measure   Help from another person eating meals?: A Little Help from another person taking care of personal grooming?: A Little Help from another person toileting, which includes using toliet, bedpan, or urinal?: A Little Help from another person bathing (including washing, rinsing, drying)?: A Little Help from another person to put on and taking off regular upper body clothing?: A Little Help from another person to put on and taking off regular lower body clothing?: A Little 6 Click Score: 18    End of Session Equipment Utilized During Treatment: Gait belt;Rolling walker (2 wheels)  OT Visit Diagnosis: Muscle weakness (generalized) (M62.81);Unsteadiness on feet (R26.81)   Activity Tolerance Patient tolerated treatment well   Patient Left in chair;with call bell/phone within reach   Nurse Communication Mobility status        Time: 1026-1100 OT Time Calculation (min): 34 min  Charges: OT General Charges $OT Visit: 1 Visit OT Treatments $Self Care/Home Management : 23-37 mins  Governor Specking OT/L Denice Paradise 11/18/2022, 2:03 PM

## 2022-11-18 NOTE — Plan of Care (Signed)

## 2022-11-18 NOTE — Discharge Summary (Signed)
Physician Discharge Summary  Patient ID: Valerie Barker MRN: [id number] DOB/AGE: 1923/03/05 89 y.o.  Admit date: 11/16/2022 Discharge date: 11/18/2022  Admission Diagnoses:  Discharge Diagnoses:  Principal Problem:   Near syncope Active Problems:   HTN (hypertension)   Restless leg syndrome   Hyperlipidemia   History of CVA (cerebrovascular accident)   Memory difficulty   Elevated troponin   Discharged Condition: stable  Hospital Course:  Patient is a 89 year old female with past medical history significant for memory impairment, hypertension, hyperlipidemia, stroke, restless leg syndrome, irritable bowel syndrome and incontinence.  Patient was admitted with recurrent falls versus syncope.  Patient is a resident of an independent living facility.  On presentation, Vital signs in the ED were notable for initial heart rate read in the 120s, but improved to the 50s to 70s. Blood pressure in the 120s to 130s systolic. Respiratory rate in the 20s to 30s. Lab workup included BMP with creatinine mildly elevated 1.2 from baseline of 1. CBC within normal limits. Troponin uptrending from 36-1 51. Imaging studies included chest x-ray with cardiomegaly and chronic interstitial lung changes. Pelvis x-ray showed no acute normality. CT head showed no acute abnormality. CT C-spine showed no acute normality but did show degenerative disease. CT L-spine showed no acute abnormality but did show moderate stenosis at L4-L5. Patient received Tylenol and aspirin in the ED. Cardiology was consulted given possible syncope and unusual ECG with supraventricular bigeminy.  Patient has been optimized.  Patient will follow up with PCP and Cardiology team on discharge.   Fall Syncope > Unwitnessed fall though concern for syncope.  Unclear of the events surrounding the fall.  Does walk with a walker at baseline. > Has had intermittent chest pain and lightheadedness for several months. > Troponin has remained flat  1 51-1 117-101.  > ECG with supraventricular bigeminy.   -Cardiology team consulted to assist with management.  Cardiology has cleared patient for discharge. -Continue to monitor on telemetry overnight -Patient is not orthostatic. -Echocardiogram is as documented below. -PT OT follow-up on discharge. -Cautious hydration.  No recent urinalysis.  Will check urinalysis (specific gravity). - Trend renal function and electrolytes -Fall precautions. - Supportive care   Elevated troponin > Current suspicion is for demand ischemia.  Does have some intermittent episodes of lightheadedness and chest pain for the last several months. > Could be elevated due to arrhythmia given ECG changes as below.  Cardiology consulted in the ED. - On telemetry as above - Trend troponin - Appreciate cardiology recommendations   Elevated creatinine > Creatinine elevated to 1.2 from baseline of around 1.  Not true AKI but possibly some dehydration which could be contributing to his symptoms if there was a syncopal event. - 500 cc IV fluid bolus - Trend renal function and electrolytes 11/17/2022: Serum creatinine is improving.  Suspect mild volume depletion.   Abnormal ECG > ECG noted to have supraventricular bigeminy and 93 bpm.  Cardiology consulted in the ED. - Appreciate cardiology recommendations 11/17/2022: Cardiology is directing care.  Cardiology has cleared patient for discharge.   Hypertension - Continue home hydrochlorothiazide 11/17/2022: Blood pressure control has improved significantly.   Hyperlipidemia - Continue home atorvastatin   Memory impairment - Continue home donepezil   History of CVA - Continue atorvastatin - Working with pharmacy to determine if still taking Plavix   RLS - Continue home ropinirole       DVT prophylaxis: Subcutaneous Lovenox Code Status: DNR/DNI Family Communication: Care Disposition Plan: Back to the  assisted living facility tomorrow with PT OT.      Consultants:  Cardiology   Procedures:  Echocardiogram revealed: 1. Left ventricular ejection fraction, by estimation, is 60 to 65%. The  left ventricle has normal function. The left ventricle has no regional  wall motion abnormalities. Left ventricular diastolic parameters are  consistent with Grade I diastolic  dysfunction (impaired relaxation).   2. Right ventricular systolic function is normal. The right ventricular  size is mildly enlarged. There is mildly elevated pulmonary artery  systolic pressure. The estimated right ventricular systolic pressure is  44.8 mmHg.   3. Left atrial size was mildly dilated.   4. Right atrial size was mildly dilated.   5. The mitral valve is normal in structure. Trivial mitral valve  regurgitation. No evidence of mitral stenosis.   6. The aortic valve is tricuspid. There is mild calcification of the  aortic valve. Aortic valve regurgitation is not visualized. No aortic  stenosis is present.   7. The inferior vena cava is normal in size with greater than 50%  respiratory variability, suggesting right atrial pressure of 3 mmHg.       Consults: cardiology  Significant Diagnostic Studies:   Treatments:   Discharge Exam: Blood pressure (!) 141/76, pulse 80, temperature 98 F (36.7 C), temperature source Oral, resp. rate 16, height 4\' 11"  (1.499 m), weight 57.2 kg, SpO2 95%.   Disposition: Discharge disposition: 01-Home or Self Care       Discharge Instructions     Diet - low sodium heart healthy   Complete by: As directed    Increase activity slowly   Complete by: As directed       Allergies as of 11/18/2022       Reactions   Ambien [zolpidem] Other (See Comments)   Unknown reaction   Cymbalta [duloxetine Hcl] Other (See Comments)   Unknown reaction   Desyrel [trazodone] Other (See Comments)   Unknown reaction   Erythromycin Other (See Comments)   Unknown reaction   Remeron [mirtazapine] Other (See Comments)   Unknown  reaction   Flexeril [cyclobenzaprine] Anxiety   Valium [diazepam] Rash        Medication List     STOP taking these medications    B-12 + Folic Acid 2500-400 MCG Tbdp   Biotin 13086 MCG Tabs   trimethoprim 100 MG tablet Commonly known as: TRIMPEX       TAKE these medications    aspirin EC 81 MG tablet Take 1 tablet (81 mg total) by mouth daily. Swallow whole. Start taking on: November 19, 2022   atorvastatin 10 MG tablet Commonly known as: LIPITOR Take 1 tablet (10 mg total) by mouth daily. Start taking on: November 19, 2022 What changed: See the new instructions.   calcium carbonate 1500 (600 Ca) MG Tabs tablet Commonly known as: OSCAL Take 600 mg of elemental calcium by mouth daily.   clopidogrel 75 MG tablet Commonly known as: PLAVIX Take 75 mg by mouth daily.   donepezil 10 MG tablet Commonly known as: ARICEPT Take 10 mg by mouth at bedtime.   hydrochlorothiazide 12.5 MG tablet Commonly known as: HYDRODIURIL Take 1 tablet (12.5 mg total) by mouth daily. Start taking on: November 19, 2022 What changed: See the new instructions.   levothyroxine 25 MCG tablet Commonly known as: SYNTHROID Take 25-50 mcg by mouth See admin instructions. 25 mcg once daily before breakfast Monday-Saturday. 50 mcg on Sundays.   One-A-Day Womens 50+ Tabs Take 1 tablet by  mouth daily.   polyethylene glycol 17 g packet Commonly known as: MIRALAX / GLYCOLAX Take 17 g by mouth daily as needed for mild constipation.   rOPINIRole 1 MG tablet Commonly known as: REQUIP Take 2 mg by mouth See admin instructions. 2 mg every afternoon and 2 mg every evening   Vitamin D 50 MCG (2000 UT) Caps Take 2,000 Units by mouth daily.        Time spent: 35 minutes.   SignedBarnetta Chapel 11/18/2022, 4:12 PM

## 2022-11-21 DIAGNOSIS — I493 Ventricular premature depolarization: Secondary | ICD-10-CM | POA: Diagnosis not present

## 2022-11-22 DIAGNOSIS — M62541 Muscle wasting and atrophy, not elsewhere classified, right hand: Secondary | ICD-10-CM | POA: Diagnosis not present

## 2022-11-22 DIAGNOSIS — R2689 Other abnormalities of gait and mobility: Secondary | ICD-10-CM | POA: Diagnosis not present

## 2022-11-22 DIAGNOSIS — M62542 Muscle wasting and atrophy, not elsewhere classified, left hand: Secondary | ICD-10-CM | POA: Diagnosis not present

## 2022-11-22 DIAGNOSIS — R2681 Unsteadiness on feet: Secondary | ICD-10-CM | POA: Diagnosis not present

## 2022-11-22 DIAGNOSIS — M62512 Muscle wasting and atrophy, not elsewhere classified, left shoulder: Secondary | ICD-10-CM | POA: Diagnosis not present

## 2022-11-22 DIAGNOSIS — M62511 Muscle wasting and atrophy, not elsewhere classified, right shoulder: Secondary | ICD-10-CM | POA: Diagnosis not present

## 2022-11-22 DIAGNOSIS — R296 Repeated falls: Secondary | ICD-10-CM | POA: Diagnosis not present

## 2022-11-23 ENCOUNTER — Telehealth: Payer: Self-pay | Admitting: Cardiovascular Disease

## 2022-11-23 NOTE — Telephone Encounter (Signed)
  Pt's daughter calling, she is requesting to speak with Dr. Landry Dyke nurse

## 2022-11-23 NOTE — Telephone Encounter (Signed)
Spoke with patient daughter per DPR and she stated her mother has been having back pain since she fell last week at [facility]. She states while in the hospital they advised that she does not need to take tylenol and if she does only take one. She states one does not help her pain and she has been up in pain not sleeping well. She would like your recommendations on how to help her. Did advise to contact PCP.

## 2022-11-24 DIAGNOSIS — R296 Repeated falls: Secondary | ICD-10-CM | POA: Diagnosis not present

## 2022-11-24 DIAGNOSIS — R2689 Other abnormalities of gait and mobility: Secondary | ICD-10-CM | POA: Diagnosis not present

## 2022-11-24 DIAGNOSIS — R2681 Unsteadiness on feet: Secondary | ICD-10-CM | POA: Diagnosis not present

## 2022-11-24 NOTE — Telephone Encounter (Signed)
Can try increasing Tylenol to 2 pills at a time to see if this improves matters.

## 2022-11-28 DIAGNOSIS — Z09 Encounter for follow-up examination after completed treatment for conditions other than malignant neoplasm: Secondary | ICD-10-CM | POA: Diagnosis not present

## 2022-11-28 DIAGNOSIS — M791 Myalgia, unspecified site: Secondary | ICD-10-CM | POA: Diagnosis not present

## 2022-11-28 DIAGNOSIS — R42 Dizziness and giddiness: Secondary | ICD-10-CM | POA: Diagnosis not present

## 2022-11-28 NOTE — Telephone Encounter (Signed)
Unable to leave voicemail.

## 2022-11-29 DIAGNOSIS — M62541 Muscle wasting and atrophy, not elsewhere classified, right hand: Secondary | ICD-10-CM | POA: Diagnosis not present

## 2022-11-29 DIAGNOSIS — R296 Repeated falls: Secondary | ICD-10-CM | POA: Diagnosis not present

## 2022-11-29 DIAGNOSIS — M62512 Muscle wasting and atrophy, not elsewhere classified, left shoulder: Secondary | ICD-10-CM | POA: Diagnosis not present

## 2022-11-29 DIAGNOSIS — R2681 Unsteadiness on feet: Secondary | ICD-10-CM | POA: Diagnosis not present

## 2022-11-29 DIAGNOSIS — M62542 Muscle wasting and atrophy, not elsewhere classified, left hand: Secondary | ICD-10-CM | POA: Diagnosis not present

## 2022-11-29 DIAGNOSIS — M62511 Muscle wasting and atrophy, not elsewhere classified, right shoulder: Secondary | ICD-10-CM | POA: Diagnosis not present

## 2022-11-29 DIAGNOSIS — R2689 Other abnormalities of gait and mobility: Secondary | ICD-10-CM | POA: Diagnosis not present

## 2022-11-30 DIAGNOSIS — R2689 Other abnormalities of gait and mobility: Secondary | ICD-10-CM | POA: Diagnosis not present

## 2022-11-30 DIAGNOSIS — R296 Repeated falls: Secondary | ICD-10-CM | POA: Diagnosis not present

## 2022-11-30 DIAGNOSIS — R2681 Unsteadiness on feet: Secondary | ICD-10-CM | POA: Diagnosis not present

## 2022-12-04 ENCOUNTER — Observation Stay (HOSPITAL_COMMUNITY)
Admission: EM | Admit: 2022-12-04 | Discharge: 2022-12-05 | Disposition: A | Discharge status: Home / Self Care | Payer: Medicare Other | Attending: Family Medicine | Admitting: Family Medicine

## 2022-12-04 ENCOUNTER — Emergency Department (HOSPITAL_COMMUNITY): Payer: Medicare Other

## 2022-12-04 ENCOUNTER — Encounter (HOSPITAL_COMMUNITY): Payer: Self-pay | Admitting: Emergency Medicine

## 2022-12-04 ENCOUNTER — Other Ambulatory Visit: Payer: Self-pay

## 2022-12-04 DIAGNOSIS — E039 Hypothyroidism, unspecified: Secondary | ICD-10-CM | POA: Diagnosis not present

## 2022-12-04 DIAGNOSIS — R0602 Shortness of breath: Secondary | ICD-10-CM | POA: Diagnosis not present

## 2022-12-04 DIAGNOSIS — I5030 Unspecified diastolic (congestive) heart failure: Secondary | ICD-10-CM | POA: Insufficient documentation

## 2022-12-04 DIAGNOSIS — R531 Weakness: Principal | ICD-10-CM | POA: Insufficient documentation

## 2022-12-04 DIAGNOSIS — I5032 Chronic diastolic (congestive) heart failure: Secondary | ICD-10-CM | POA: Insufficient documentation

## 2022-12-04 DIAGNOSIS — R404 Transient alteration of awareness: Secondary | ICD-10-CM | POA: Diagnosis not present

## 2022-12-04 DIAGNOSIS — R7989 Other specified abnormal findings of blood chemistry: Secondary | ICD-10-CM | POA: Insufficient documentation

## 2022-12-04 DIAGNOSIS — Z7982 Long term (current) use of aspirin: Secondary | ICD-10-CM | POA: Insufficient documentation

## 2022-12-04 DIAGNOSIS — I493 Ventricular premature depolarization: Secondary | ICD-10-CM | POA: Insufficient documentation

## 2022-12-04 DIAGNOSIS — G2581 Restless legs syndrome: Secondary | ICD-10-CM | POA: Diagnosis present

## 2022-12-04 DIAGNOSIS — I10 Essential (primary) hypertension: Secondary | ICD-10-CM | POA: Insufficient documentation

## 2022-12-04 DIAGNOSIS — R001 Bradycardia, unspecified: Secondary | ICD-10-CM | POA: Diagnosis not present

## 2022-12-04 DIAGNOSIS — N179 Acute kidney failure, unspecified: Secondary | ICD-10-CM | POA: Diagnosis not present

## 2022-12-04 DIAGNOSIS — I499 Cardiac arrhythmia, unspecified: Secondary | ICD-10-CM | POA: Diagnosis not present

## 2022-12-04 DIAGNOSIS — I495 Sick sinus syndrome: Secondary | ICD-10-CM | POA: Diagnosis not present

## 2022-12-04 DIAGNOSIS — Z743 Need for continuous supervision: Secondary | ICD-10-CM | POA: Diagnosis not present

## 2022-12-04 DIAGNOSIS — N189 Chronic kidney disease, unspecified: Secondary | ICD-10-CM | POA: Diagnosis not present

## 2022-12-04 DIAGNOSIS — Z8673 Personal history of transient ischemic attack (TIA), and cerebral infarction without residual deficits: Secondary | ICD-10-CM

## 2022-12-04 DIAGNOSIS — J42 Unspecified chronic bronchitis: Secondary | ICD-10-CM | POA: Diagnosis not present

## 2022-12-04 DIAGNOSIS — R413 Other amnesia: Secondary | ICD-10-CM | POA: Diagnosis not present

## 2022-12-04 DIAGNOSIS — E785 Hyperlipidemia, unspecified: Secondary | ICD-10-CM | POA: Diagnosis present

## 2022-12-04 DIAGNOSIS — I130 Hypertensive heart and chronic kidney disease with heart failure and stage 1 through stage 4 chronic kidney disease, or unspecified chronic kidney disease: Secondary | ICD-10-CM | POA: Insufficient documentation

## 2022-12-04 DIAGNOSIS — I700 Atherosclerosis of aorta: Secondary | ICD-10-CM | POA: Diagnosis not present

## 2022-12-04 DIAGNOSIS — M6281 Muscle weakness (generalized): Secondary | ICD-10-CM | POA: Diagnosis not present

## 2022-12-04 DIAGNOSIS — K589 Irritable bowel syndrome without diarrhea: Secondary | ICD-10-CM | POA: Insufficient documentation

## 2022-12-04 DIAGNOSIS — R410 Disorientation, unspecified: Secondary | ICD-10-CM | POA: Diagnosis not present

## 2022-12-04 DIAGNOSIS — R2689 Other abnormalities of gait and mobility: Secondary | ICD-10-CM | POA: Insufficient documentation

## 2022-12-04 DIAGNOSIS — R000 Tachycardia, unspecified: Secondary | ICD-10-CM | POA: Diagnosis not present

## 2022-12-04 LAB — COMPREHENSIVE METABOLIC PANEL
ALT: 23 U/L (ref 0–44)
AST: 30 U/L (ref 15–41)
Albumin: 3.6 g/dL (ref 3.5–5.0)
Alkaline Phosphatase: 106 U/L (ref 38–126)
Anion gap: 11 (ref 5–15)
BUN: 23 mg/dL (ref 8–23)
CO2: 23 mmol/L (ref 22–32)
Calcium: 9 mg/dL (ref 8.9–10.3)
Chloride: 101 mmol/L (ref 98–111)
Creatinine, Ser: 1.15 mg/dL — ABNORMAL HIGH (ref 0.44–1.00)
GFR, Estimated: 43 mL/min — ABNORMAL LOW (ref >60)
Glucose, Bld: 103 mg/dL — ABNORMAL HIGH (ref 70–99)
Potassium: 4.1 mmol/L (ref 3.5–5.1)
Sodium: 135 mmol/L (ref 135–145)
Total Bilirubin: 0.5 mg/dL (ref 0.3–1.2)
Total Protein: 6.1 g/dL — ABNORMAL LOW (ref 6.5–8.1)

## 2022-12-04 LAB — CBC WITH DIFFERENTIAL/PLATELET
Abs Immature Granulocytes: 0.03 10*3/uL (ref 0.00–0.07)
Basophils Absolute: 0 10*3/uL (ref 0.0–0.1)
Basophils Relative: 1 %
Eosinophils Absolute: 0.1 10*3/uL (ref 0.0–0.5)
Eosinophils Relative: 2 %
HCT: 36.5 % (ref 36.0–46.0)
Hemoglobin: 12 g/dL (ref 12.0–15.0)
Immature Granulocytes: 0 %
Lymphocytes Relative: 27 %
Lymphs Abs: 1.8 10*3/uL (ref 0.7–4.0)
MCH: 29.6 pg (ref 26.0–34.0)
MCHC: 32.9 g/dL (ref 30.0–36.0)
MCV: 89.9 fL (ref 80.0–100.0)
Monocytes Absolute: 0.8 10*3/uL (ref 0.1–1.0)
Monocytes Relative: 12 %
Neutro Abs: 3.9 10*3/uL (ref 1.7–7.7)
Neutrophils Relative %: 58 %
Platelets: 205 10*3/uL (ref 150–400)
RBC: 4.06 MIL/uL (ref 3.87–5.11)
RDW: 14.1 % (ref 11.5–15.5)
WBC: 6.7 10*3/uL (ref 4.0–10.5)
nRBC: 0 % (ref 0.0–0.2)

## 2022-12-04 LAB — MAGNESIUM: Magnesium: 1.9 mg/dL (ref 1.7–2.4)

## 2022-12-04 LAB — BRAIN NATRIURETIC PEPTIDE: B Natriuretic Peptide: 199.2 pg/mL — ABNORMAL HIGH (ref 0.0–100.0)

## 2022-12-04 LAB — TROPONIN I (HIGH SENSITIVITY): Troponin I (High Sensitivity): 10 ng/L (ref <18)

## 2022-12-04 MED ORDER — ROPINIROLE HCL 1 MG PO TABS
1.0000 mg | ORAL_TABLET | Freq: Once | ORAL | Status: AC
  Administered 2022-12-04: 1 mg via ORAL
  Filled 2022-12-04: qty 1

## 2022-12-04 NOTE — ED Triage Notes (Signed)
Patient BIB [facility] c/o weakness, and sob all day.  EMS reports episodes of bradycardia in the high 30s while enroute.  132/90 18 g r hand  CBG 118

## 2022-12-04 NOTE — H&P (Incomplete)
History and Physical    Valerie Barker XNA:[id number] DOB: 1923-03-19 DOA: 12/04/2022  PCP: Merri Brunette, MD   Patient coming from: Nursing home   Chief Complaint:  Chief Complaint  Patient presents with   Weakness   Bradycardia    HPI:  Valerie Barker is a 89 y.o. female with medical history significant for grade 1 diastolic heart failure with preserved EF 60 to 65%,  memory impairment, hypertension, hyperlipidemia, CVA, restless leg syndrome, IBS, and bladder incontinence presented to for nursing home with complaining of generalized weakness and shortness of breath.  Patient also had 1 episode of bradycardia heart rate dropped to 30.   ED Course:  At presentation to ED patient reported to 97.4, heart rate 81, respiratory rate 13, blood pressure 138/57 O2 sat 98% room air. CBC grossly unremarkable. CMP showed sodium 135, potassium 4.1, chloride 101, bicarb 23, blood glucose 103, BUN 23, elevated creatinine 1.15, calcium 9, protein 6.1, GFR 43 anion gap 11. Normal mag 1.9. Troponin 10. Elevated BNP 199. Chest x-ray showing- Stable enlargement of the cardiomediastinal silhouette. Aortic atherosclerotic calcification. Chronic bronchitic changes. No focal consolidation, pleural effusion, or pneumothorax. No displaced rib fractures.  No acute cardiopulmonary process   Review of Systems:  ROS  Past Medical History:  Diagnosis Date   Acute urinary retention 04/17/2018   Heart disease    Hypertension    IBS (irritable bowel syndrome)    Stroke (HCC) 04/17/2018   around 2011   TIA (transient ischemic attack)     Past Surgical History:  Procedure Laterality Date   ABDOMINAL HYSTERECTOMY  1978   BUNIONECTOMY  2006, 1996   LAPAROSCOPIC SALPINGO OOPHERECTOMY  1988     reports that she has never smoked. She has never used smokeless tobacco. She reports that she does not drink alcohol and does not use drugs.  Allergies  Allergen Reactions   Ambien [Zolpidem]  Other (See Comments)    Unknown reaction   Cymbalta [Duloxetine Hcl] Other (See Comments)    Unknown reaction   Desyrel [Trazodone] Other (See Comments)    Unknown reaction   Erythromycin Other (See Comments)    Unknown reaction   Remeron [Mirtazapine] Other (See Comments)    Unknown reaction   Flexeril [Cyclobenzaprine] Anxiety   Valium [Diazepam] Rash    Family History  Problem Relation Age of Onset   COPD Mother    Schizophrenia Brother    Stroke Father    Breast cancer Neg Hx     Prior to Admission medications   Medication Sig Start Date End Date Taking? Authorizing Provider  aspirin EC 81 MG tablet Take 1 tablet (81 mg total) by mouth daily. Swallow whole. 11/19/22  Yes Berton Mount I, MD  atorvastatin (LIPITOR) 10 MG tablet Take 1 tablet (10 mg total) by mouth daily. 11/19/22  Yes Berton Mount I, MD  calcium carbonate (OSCAL) 1500 (600 Ca) MG TABS tablet Take 600 mg of elemental calcium by mouth daily.   Yes [provider]  Cholecalciferol (VITAMIN D) 2000 UNITS CAPS Take 2,000 Units by mouth daily.   Yes [provider]  clopidogrel (PLAVIX) 75 MG tablet Take 75 mg by mouth daily.   Yes [provider]  donepezil (ARICEPT) 10 MG tablet Take 10 mg by mouth at bedtime. 10/05/22  Yes [provider]  hydrochlorothiazide (HYDRODIURIL) 12.5 MG tablet Take 1 tablet (12.5 mg total) by mouth daily. 11/19/22  Yes Barnetta Chapel, MD  levothyroxine (SYNTHROID) 25 MCG tablet  Take 25-50 mcg by mouth See admin instructions. 25 mcg once daily before breakfast Monday-Saturday. 50 mcg on Sundays.   Yes [provider]  polyethylene glycol (MIRALAX / GLYCOLAX) 17 g packet Take 17 g by mouth daily as needed for mild constipation. 11/18/22  Yes Berton Mount I, MD  rOPINIRole (REQUIP) 1 MG tablet Take 2 mg by mouth See admin instructions. 2 mg every afternoon and 2 mg every evening 05/26/18  Yes [provider]  trimethoprim  (TRIMPEX) 100 MG tablet Take 100 mg by mouth every morning. 11/24/22  Yes [provider]  Multiple Vitamins-Minerals (ONE-A-DAY WOMENS 50+) TABS Take 1 tablet by mouth daily.    [provider]     Physical Exam: Vitals:   12/04/22 2010 12/04/22 2014 12/04/22 2314  BP: (!) 138/57  (!) 149/88  Pulse: 81  74  Resp: 13  (!) 21  Temp: (!) 97.4 F (36.3 C)    TempSrc: Oral    SpO2: 98%    Weight:  56.7 kg     Physical Exam   Labs on Admission: I have personally reviewed following labs and imaging studies  CBC: Recent Labs  Lab 12/04/22 2123  WBC 6.7  NEUTROABS 3.9  HGB 12.0  HCT 36.5  MCV 89.9  PLT 205   Basic Metabolic Panel: Recent Labs  Lab 12/04/22 2123  NA 135  K 4.1  CL 101  CO2 23  GLUCOSE 103*  BUN 23  CREATININE 1.15*  CALCIUM 9.0  MG 1.9   GFR: Estimated Creatinine Clearance: 21.5 mL/min (A) (by C-G formula based on SCr of 1.15 mg/dL (H)). Liver Function Tests: Recent Labs  Lab 12/04/22 2123  AST 30  ALT 23  ALKPHOS 106  BILITOT 0.5  PROT 6.1*  ALBUMIN 3.6   No results for input(s): "LIPASE", "AMYLASE" in the last 168 hours. No results for input(s): "AMMONIA" in the last 168 hours. Coagulation Profile: No results for input(s): "INR", "PROTIME" in the last 168 hours. Cardiac Enzymes: Recent Labs  Lab 12/04/22 2123  TROPONINIHS 10   BNP (last 3 results) Recent Labs    12/04/22 2123  BNP 199.2*   HbA1C: No results for input(s): "HGBA1C" in the last 72 hours. CBG: No results for input(s): "GLUCAP" in the last 168 hours. Lipid Profile: No results for input(s): "CHOL", "HDL", "LDLCALC", "TRIG", "CHOLHDL", "LDLDIRECT" in the last 72 hours. Thyroid Function Tests: No results for input(s): "TSH", "T4TOTAL", "FREET4", "T3FREE", "THYROIDAB" in the last 72 hours. Anemia Panel: No results for input(s): "VITAMINB12", "FOLATE", "FERRITIN", "TIBC", "IRON", "RETICCTPCT" in the last 72 hours. Urine analysis:    Component  Value Date/Time   COLORURINE YELLOW 04/17/2018 1340   APPEARANCEUR HAZY (A) 04/17/2018 1340   LABSPEC 1.009 04/17/2018 1340   PHURINE 7.0 04/17/2018 1340   GLUCOSEU NEGATIVE 04/17/2018 1340   HGBUR SMALL (A) 04/17/2018 1340   BILIRUBINUR NEGATIVE 04/17/2018 1340   KETONESUR NEGATIVE 04/17/2018 1340   PROTEINUR NEGATIVE 04/17/2018 1340   UROBILINOGEN 0.2 04/27/2009 1643   NITRITE NEGATIVE 04/17/2018 1340   LEUKOCYTESUR TRACE (A) 04/17/2018 1340    Radiological Exams on Admission: I have personally reviewed images DG Chest Portable 1 View  Result Date: 12/04/2022 CLINICAL DATA:  Weakness and bradycardia EXAM: PORTABLE CHEST 1 VIEW COMPARISON:  Radiograph 11/16/2022 FINDINGS: Stable enlargement of the cardiomediastinal silhouette. Aortic atherosclerotic calcification. Chronic bronchitic changes. No focal consolidation, pleural effusion, or pneumothorax. No displaced rib fractures. IMPRESSION: No acute cardiopulmonary process. Electronically Signed   By: Joselyn Glassman  Stutzman M.D.   On: 12/04/2022 21:39    EKG: My personal interpretation of EKG shows: ***    Assessment/Plan: Active Problems:   * No active hospital problems. *    Assessment and Plan: No notes have been filed under this hospital service. Service: Hospitalist      DVT prophylaxis:  {Blank single:19197::"Lovenox","SQ Heparin","IV heparin gtts","Xarelto","Eliquis","Coumadin","SCDs","***"} Code Status:  {Blank single:19197::"Full Code","DNR with Intubation","DNR/DNI(Do NOT Intubate)","Comfort Care","***"} Diet:  Family Communication:  ***  Disposition Plan:  ***  Consults:  ***  Admission status:   {Blank single:19197::"Observation","Inpatient"}, {Blank single:19197::"Med-Surg","Telemetry bed","Step Down Unit"}  Severity of Illness: {Observation/Inpatient:21159}    Tereasa Coop, MD [facility]  How to contact the [facility] Attending or Consulting provider 7A - 7P or covering provider during after hours 7P -7A,  for this patient.  Check the care team in [facility] and look for a) attending/consulting [facility] provider listed and b) the [facility] team listed Log into [email or web address] and use [facility]'s universal password to access. If you do not have the password, please contact the hospital operator. Locate the [facility] provider you are looking for under [facility] and page to a number that you can be directly reached. If you still have difficulty reaching the provider, please page the [facility] (Director on Call) for the Hospitalists listed on amion for assistance.  12/04/2022, 11:57 PM

## 2022-12-04 NOTE — ED Provider Notes (Incomplete)
Patient is a 89 year old female with multiple medical problems presenting today with abrupt onset around 6 today of feeling like her heart was racing and being short of breath.  She reports when she felt her pulse it was obviously going fast but she could not calculate how fast and could not get her blood pressure cuff to work.  When EMS got there they reported heart rates between 120 but then as low as in the 30s.  She is wearing a Zio patch for the same similar region.  She reports she did not feel like she was going to pass out.  She denied any chest pain.  Patient's labs today without acute findings except for a BNP of 199.  On continuous cardiac monitoring here patient's heart rate has been in the 60s but EKG does show frequent PVCs which is similar to her prior EKG.  Due to patient's erratic heartbeat, living alone will admit for ongoing observation and cardiology to see patient in the morning.

## 2022-12-04 NOTE — ED Provider Notes (Signed)
Valerie Barker Provider Note   CSN: [id number] Arrival date & time: 12/04/22  2011     History {Add pertinent medical, surgical, social history, OB history to HPI:1} Chief Complaint  Patient presents with   Weakness   Bradycardia    Valerie Barker is a 89 y.o. female.   Weakness      Home Medications Prior to Admission medications   Medication Sig Start Date End Date Taking? Authorizing Provider  aspirin EC 81 MG tablet Take 1 tablet (81 mg total) by mouth daily. Swallow whole. 11/19/22   Barnetta Chapel, MD  atorvastatin (LIPITOR) 10 MG tablet Take 1 tablet (10 mg total) by mouth daily. 11/19/22   Barnetta Chapel, MD  calcium carbonate (OSCAL) 1500 (600 Ca) MG TABS tablet Take 600 mg of elemental calcium by mouth daily.    [provider]  Cholecalciferol (VITAMIN D) 2000 UNITS CAPS Take 2,000 Units by mouth daily.    [provider]  clopidogrel (PLAVIX) 75 MG tablet Take 75 mg by mouth daily.    [provider]  donepezil (ARICEPT) 10 MG tablet Take 10 mg by mouth at bedtime. 10/05/22   [provider]  hydrochlorothiazide (HYDRODIURIL) 12.5 MG tablet Take 1 tablet (12.5 mg total) by mouth daily. 11/19/22   Barnetta Chapel, MD  levothyroxine (SYNTHROID) 25 MCG tablet Take 25-50 mcg by mouth See admin instructions. 25 mcg once daily before breakfast Monday-Saturday. 50 mcg on Sundays.    [provider]  Multiple Vitamins-Minerals (ONE-A-DAY WOMENS 50+) TABS Take 1 tablet by mouth daily.    [provider]  polyethylene glycol (MIRALAX / GLYCOLAX) 17 g packet Take 17 g by mouth daily as needed for mild constipation. 11/18/22   Barnetta Chapel, MD  rOPINIRole (REQUIP) 1 MG tablet Take 2 mg by mouth See admin instructions. 2 mg every afternoon and 2 mg every evening 05/26/18   [provider]      Allergies    Ambien [zolpidem], Cymbalta [duloxetine hcl],  Desyrel [trazodone], Erythromycin, Remeron [mirtazapine], Flexeril [cyclobenzaprine], and Valium [diazepam]    Review of Systems   Review of Systems  Neurological:  Positive for weakness.    Physical Exam Updated Vital Signs BP (!) 138/57 (BP Location: Right Arm)   Pulse 81   Temp (!) 97.4 Barker (36.3 C) (Oral)   Resp 13   Wt 56.7 kg   SpO2 98%   BMI 25.25 kg/m  Physical Exam  ED Results / Procedures / Treatments   Labs (all labs ordered are listed, but only abnormal results are displayed) Labs Reviewed - No data to display  EKG None  Radiology No results found.  Procedures Procedures  {Document cardiac monitor, telemetry assessment procedure when appropriate:1}  Medications Ordered in ED Medications - No data to display  ED Course/ Medical Decision Making/ A&P   {   Click here for ABCD2, HEART and other calculatorsREFRESH Note before signing :1}                              Medical Decision Making  ***  {Document critical care time when appropriate:1} {Document review of labs and clinical decision tools ie heart score, Chads2Vasc2 etc:1}  {Document your independent review of radiology images, and any outside records:1} {Document your discussion with family members, caretakers, and with consultants:1} {Document social determinants of health affecting pt's care:1} {Document your decision  making why or why not admission, treatments were needed:1} Final Clinical Impression(s) / ED Diagnoses Final diagnoses:  None    Rx / DC Orders ED Discharge Orders     None

## 2022-12-05 ENCOUNTER — Encounter (HOSPITAL_COMMUNITY): Payer: Self-pay | Admitting: Internal Medicine

## 2022-12-05 DIAGNOSIS — N179 Acute kidney failure, unspecified: Secondary | ICD-10-CM | POA: Diagnosis not present

## 2022-12-05 DIAGNOSIS — I495 Sick sinus syndrome: Secondary | ICD-10-CM

## 2022-12-05 DIAGNOSIS — I493 Ventricular premature depolarization: Secondary | ICD-10-CM | POA: Insufficient documentation

## 2022-12-05 DIAGNOSIS — I10 Essential (primary) hypertension: Secondary | ICD-10-CM | POA: Insufficient documentation

## 2022-12-05 DIAGNOSIS — N189 Chronic kidney disease, unspecified: Secondary | ICD-10-CM

## 2022-12-05 DIAGNOSIS — R001 Bradycardia, unspecified: Secondary | ICD-10-CM | POA: Diagnosis present

## 2022-12-05 DIAGNOSIS — I5030 Unspecified diastolic (congestive) heart failure: Secondary | ICD-10-CM | POA: Insufficient documentation

## 2022-12-05 DIAGNOSIS — E039 Hypothyroidism, unspecified: Secondary | ICD-10-CM | POA: Insufficient documentation

## 2022-12-05 DIAGNOSIS — K589 Irritable bowel syndrome without diarrhea: Secondary | ICD-10-CM | POA: Insufficient documentation

## 2022-12-05 LAB — COMPREHENSIVE METABOLIC PANEL
ALT: 21 U/L (ref 0–44)
AST: 24 U/L (ref 15–41)
Albumin: 3.4 g/dL — ABNORMAL LOW (ref 3.5–5.0)
Alkaline Phosphatase: 96 U/L (ref 38–126)
Anion gap: 8 (ref 5–15)
BUN: 21 mg/dL (ref 8–23)
CO2: 24 mmol/L (ref 22–32)
Calcium: 8.7 mg/dL — ABNORMAL LOW (ref 8.9–10.3)
Chloride: 104 mmol/L (ref 98–111)
Creatinine, Ser: 1.16 mg/dL — ABNORMAL HIGH (ref 0.44–1.00)
GFR, Estimated: 43 mL/min — ABNORMAL LOW (ref >60)
Glucose, Bld: 96 mg/dL (ref 70–99)
Potassium: 3.7 mmol/L (ref 3.5–5.1)
Sodium: 136 mmol/L (ref 135–145)
Total Bilirubin: 0.5 mg/dL (ref 0.3–1.2)
Total Protein: 5.8 g/dL — ABNORMAL LOW (ref 6.5–8.1)

## 2022-12-05 LAB — TSH: TSH: 4.275 u[IU]/mL (ref 0.350–4.500)

## 2022-12-05 LAB — SODIUM, URINE, RANDOM: Sodium, Ur: 113 mmol/L

## 2022-12-05 LAB — CREATININE, URINE, RANDOM: Creatinine, Urine: 70 mg/dL

## 2022-12-05 LAB — CBC
HCT: 35.6 % — ABNORMAL LOW (ref 36.0–46.0)
Hemoglobin: 11.5 g/dL — ABNORMAL LOW (ref 12.0–15.0)
MCH: 29.9 pg (ref 26.0–34.0)
MCHC: 32.3 g/dL (ref 30.0–36.0)
MCV: 92.7 fL (ref 80.0–100.0)
Platelets: 202 10*3/uL (ref 150–400)
RBC: 3.84 MIL/uL — ABNORMAL LOW (ref 3.87–5.11)
RDW: 14.3 % (ref 11.5–15.5)
WBC: 6.9 10*3/uL (ref 4.0–10.5)
nRBC: 0 % (ref 0.0–0.2)

## 2022-12-05 LAB — TROPONIN I (HIGH SENSITIVITY): Troponin I (High Sensitivity): 10 ng/L (ref <18)

## 2022-12-05 MED ORDER — SODIUM CHLORIDE 0.9% FLUSH
3.0000 mL | Freq: Two times a day (BID) | INTRAVENOUS | Status: DC
  Administered 2022-12-05 (×2): 3 mL via INTRAVENOUS

## 2022-12-05 MED ORDER — ONDANSETRON HCL 4 MG/2ML IJ SOLN: 4.0000 mg | Freq: Four times a day (QID) | INTRAMUSCULAR | Status: DC | PRN

## 2022-12-05 MED ORDER — LEVOTHYROXINE SODIUM 25 MCG PO TABS
25.0000 ug | ORAL_TABLET | ORAL | Status: DC
  Administered 2022-12-05: 25 ug via ORAL
  Filled 2022-12-05: qty 1

## 2022-12-05 MED ORDER — LEVOTHYROXINE SODIUM 25 MCG PO TABS: 25.0000 ug | ORAL_TABLET | ORAL | Status: DC

## 2022-12-05 MED ORDER — LACTATED RINGERS IV SOLN: INTRAVENOUS | Status: DC

## 2022-12-05 MED ORDER — ROPINIROLE HCL 1 MG PO TABS
2.0000 mg | ORAL_TABLET | Freq: Two times a day (BID) | ORAL | Status: DC
  Administered 2022-12-05: 2 mg via ORAL
  Filled 2022-12-05: qty 2

## 2022-12-05 MED ORDER — ACETAMINOPHEN 325 MG PO TABS: 650.0000 mg | ORAL_TABLET | Freq: Four times a day (QID) | ORAL | Status: DC | PRN

## 2022-12-05 MED ORDER — ENOXAPARIN SODIUM 30 MG/0.3ML IJ SOSY
30.0000 mg | PREFILLED_SYRINGE | INTRAMUSCULAR | Status: DC
  Administered 2022-12-05: 30 mg via SUBCUTANEOUS
  Filled 2022-12-05: qty 0.3

## 2022-12-05 MED ORDER — ASPIRIN 81 MG PO TBEC
81.0000 mg | DELAYED_RELEASE_TABLET | Freq: Every day | ORAL | Status: DC
  Administered 2022-12-05: 81 mg via ORAL
  Filled 2022-12-05: qty 1

## 2022-12-05 MED ORDER — ACETAMINOPHEN 650 MG RE SUPP: 650.0000 mg | Freq: Four times a day (QID) | RECTAL | Status: DC | PRN

## 2022-12-05 MED ORDER — CLOPIDOGREL BISULFATE 75 MG PO TABS
75.0000 mg | ORAL_TABLET | Freq: Every day | ORAL | Status: DC
  Administered 2022-12-05: 75 mg via ORAL
  Filled 2022-12-05: qty 1

## 2022-12-05 MED ORDER — ATORVASTATIN CALCIUM 10 MG PO TABS
10.0000 mg | ORAL_TABLET | Freq: Every day | ORAL | Status: DC
  Administered 2022-12-05: 10 mg via ORAL
  Filled 2022-12-05: qty 1

## 2022-12-05 MED ORDER — SODIUM CHLORIDE 0.9% FLUSH: 3.0000 mL | INTRAVENOUS | Status: DC | PRN

## 2022-12-05 MED ORDER — SODIUM CHLORIDE 0.9 % IV SOLN: 250.0000 mL | INTRAVENOUS | Status: DC | PRN

## 2022-12-05 MED ORDER — LEVOTHYROXINE SODIUM 50 MCG PO TABS: 50.0000 ug | ORAL_TABLET | ORAL | Status: DC

## 2022-12-05 MED ORDER — DONEPEZIL HCL 10 MG PO TABS: 10.0000 mg | ORAL_TABLET | Freq: Every day | ORAL | Status: DC

## 2022-12-05 MED ORDER — ONDANSETRON HCL 4 MG PO TABS: 4.0000 mg | ORAL_TABLET | Freq: Four times a day (QID) | ORAL | Status: DC | PRN

## 2022-12-05 MED ORDER — SENNOSIDES-DOCUSATE SODIUM 8.6-50 MG PO TABS
1.0000 | ORAL_TABLET | Freq: Every evening | ORAL | Status: DC | PRN
  Filled 2022-12-05: qty 1

## 2022-12-05 NOTE — ED Notes (Signed)
PT at bedside.

## 2022-12-05 NOTE — Evaluation (Signed)
Physical Therapy Evaluation Patient Details Name: Valerie Barker MRN: [id number] DOB: 04-05-1922 Today's Date: 12/05/2022  History of Present Illness  Pt is 89 year old presented to [facility] on  12/04/22 for weakness, SOB, and bradycardia. Pt with AKI on CKD and tachy brady syndrome. PMH - chf, cva, memory impairment, HTN, IBS, stroke, TIA.  Clinical Impression  Pt very close to or at her recent baseline. Able to amb several hundred feet from one ED room to her new ED room. Assist she required primarily due to the environment (very high stretcher, etc) and expect in her own environment she will be back to her baseline. Recommend resuming HHPT when she returns home.       If plan is discharge home, recommend the following: A little help with bathing/dressing/bathroom;Assist for transportation;Assistance with cooking/housework   Can travel by private vehicle        Equipment Recommendations None recommended by PT  Recommendations for Other Services       Functional Status Assessment       Precautions / Restrictions Precautions Precautions: Fall      Mobility  Bed Mobility Overal bed mobility: Needs Assistance Bed Mobility: Supine to Sit, Rolling Rolling: Supervision   Supine to sit: Min assist     General bed mobility comments: Pt used my hand to pull trunk into sitting    Transfers Overall transfer level: Needs assistance Equipment used: Rollator (4 wheels) Transfers: Sit to/from Stand, Bed to chair/wheelchair/BSC Sit to Stand: Contact guard assist   Step pivot transfers: Supervision       General transfer comment: CGA due to pt having to come off high stretcher into stand    Ambulation/Gait Ambulation/Gait assistance: Supervision Gait Distance (Feet): 250 Feet Assistive device: Rollator (4 wheels) Gait Pattern/deviations: Step-through pattern, Decreased stride length, Trunk flexed Gait velocity: decr     General Gait Details: Assist for  safety.  Stairs            Wheelchair Mobility     Tilt Bed    Modified Rankin (Stroke Patients Only)       Balance Overall balance assessment: Needs assistance Sitting-balance support: No upper extremity supported, Feet supported Sitting balance-Leahy Scale: Fair     Standing balance support: Single extremity supported, Bilateral upper extremity supported Standing balance-Leahy Scale: Poor Standing balance comment: UE support                             Pertinent Vitals/Pain Pain Assessment Pain Assessment: Faces Faces Pain Scale: Hurts little more Pain Location: neck Pain Descriptors / Indicators: Constant, Sore Pain Intervention(s): Limited activity within patient's tolerance, Monitored during session    Home Living Family/patient expects to be discharged to:: Private residence Living Arrangements: Alone Available Help at Discharge: Family;Personal care attendant;Available PRN/intermittently Type of Home: Independent living facility Home Access: Level entry;Elevator       Home Layout: One level Home Equipment: Rollator (4 wheels);Transport chair Additional Comments: lives in apartment at [facility]    Prior Function Prior Level of Function : Needs assist       Physical Assist : ADLs (physical) (assist in and out of shower)   ADLs (physical): IADLs (caregivers perform) Mobility Comments: ambulatory with rollator; walks around apartment without issue, but typically pushed to dining hall and for community ambulation in transport chair ADLs Comments: pt reports mod indep with majority of ADLs, intermittent assist from PCA; assist for bathing. pt performs light ADL  tasks (typically eats breakfast in her room)     Extremity/Trunk Assessment   Upper Extremity Assessment Upper Extremity Assessment: Generalized weakness    Lower Extremity Assessment Lower Extremity Assessment: Generalized weakness    Cervical / Trunk  Assessment Cervical / Trunk Assessment: Kyphotic  Communication   Communication Communication: No apparent difficulties  Cognition Arousal: Alert Behavior During Therapy: WFL for tasks assessed/performed Overall Cognitive Status: History of cognitive impairments - at baseline                                 General Comments: mild memory impairment        General Comments      Exercises     Assessment/Plan    PT Assessment All further PT needs can be met in the next venue of care  PT Problem List Decreased strength;Decreased balance;Decreased mobility       PT Treatment Interventions      PT Goals (Current goals can be found in the Care Plan section)  Acute Rehab PT Goals PT Goal Formulation: All assessment and education complete, DC therapy    Frequency       Co-evaluation               AM-PAC PT "6 Clicks" Mobility  Outcome Measure Help needed turning from your back to your side while in a flat bed without using bedrails?: None Help needed moving from lying on your back to sitting on the side of a flat bed without using bedrails?: A Little Help needed moving to and from a bed to a chair (including a wheelchair)?: A Little Help needed standing up from a chair using your arms (e.g., wheelchair or bedside chair)?: A Little Help needed to walk in hospital room?: A Little Help needed climbing 3-5 steps with a railing? : A Lot 6 Click Score: 18    End of Session   Activity Tolerance: Patient tolerated treatment well Patient left: in chair;with call bell/phone within reach;with family/visitor present Nurse Communication: Mobility status PT Visit Diagnosis: Muscle weakness (generalized) (M62.81);Other abnormalities of gait and mobility (R26.89)    Time: 6213-0865 PT Time Calculation (min) (ACUTE ONLY): 42 min   Charges:   PT Evaluation $PT Eval Moderate Complexity: 1 Mod PT Treatments $Gait Training: 23-37 mins PT General Charges $$ ACUTE  PT VISIT: 1 Visit         [facility] PT Acute Rehabilitation Services Office [phone]   Angelina Ok [facility] 12/05/2022, 10:20 AM

## 2022-12-05 NOTE — Care Management (Signed)
Transition of Care [facility]) - Emergency Department Mini Assessment   Patient Details  Name: Valerie Barker MRN: [id number] Date of Birth: 1922-10-01  Transition of [facility]) CM/SW Contact:    Lavenia Atlas, RN Phone Number: 12/05/2022, 1:56 PM   Clinical Narrative: PT recommends HHPT/OT. Per chart review patient has HH PT/OT with [facility]. No TOC needs at this time.   ED Mini Assessment: What brought you to the Emergency Department? : c/o weakness, and sob.  Barriers to Discharge: No Barriers Identified  Barrier interventions: Home health review     Interventions which prevented an admission or readmission: Home Health Consult or Services    Patient Contact and Communications        ,            CMS Medicare.gov Compare Post Acute Care list provided to:: Patient Choice offered to / list presented to : Patient  Admission diagnosis:  AKI (acute kidney injury) (HCC) [N17.9] Bradycardia [R00.1] Patient Active Problem List   Diagnosis Date Noted   Tachycardia-bradycardia syndrome (HCC) 12/05/2022   Acute kidney injury superimposed on chronic kidney disease (HCC) 12/05/2022   Diastolic heart failure with preserved EF 60-65 (HCC) 12/05/2022   Essential hypertension 12/05/2022   IBS (irritable bowel syndrome) 12/05/2022   History of premature ventricular complex 12/05/2022   Hypothyroidism 12/05/2022   Bradycardia 12/05/2022   Near syncope 11/16/2022   Elevated troponin 11/16/2022   Excessive daytime sleepiness 11/20/2019   History of CVA (cerebrovascular accident) 04/17/2018   Memory impairment 12/16/2015   Restless leg syndrome 08/26/2015   Hyperlipidemia 08/26/2015   Sinus bradycardia 02/06/2015   OAB (overactive bladder) 01/14/2015   Lower extremity edema 08/05/2014   Mixed incontinence 07/21/2014   Short-term memory loss 02/01/2014   Palpitation 06/03/2013   HTN (hypertension) 01/28/2013   Arm numbness 01/28/2013   PCP:  Merri Brunette, MD Pharmacy:   [facility] [place], [place] - [address] [place] [address] Phone: [phone] Fax: [phone]

## 2022-12-05 NOTE — ED Notes (Signed)
Pt moved up in bed to eat breakfast. Pt well appearing at this time.

## 2022-12-05 NOTE — Discharge Summary (Signed)
Physician Discharge Summary  Valerie Barker BJY:[id number] DOB: 09/01/1922 DOA: 12/04/2022  PCP: Valerie Brunette, MD  Admit date: 12/04/2022 Discharge date: 12/05/2022 30 Day Unplanned Readmission Risk Score    Flowsheet Row ED from 12/04/2022 in [facility] Emergency Department at [facility]  30 Day Unplanned Readmission Risk Score (%) 16.64 Filed at 12/05/2022 1200       This score is the patient's risk of an unplanned readmission within 30 days of being discharged (0 -100%). The score is based on dignosis, age, lab data, medications, orders, and past utilization.   Low:  0-14.9   Medium: 15-21.9   High: 22-29.9   Extreme: 30 and above          Admitted From: Skilled nursing facility Disposition: Nursing facility  Recommendations for Outpatient Follow-up:  Follow up with PCP in 1-2 weeks Please obtain BMP/CBC in one week Please follow up with your PCP on the following pending results: Unresulted Labs (From admission, onward)    None         Home Health: None Equipment/Devices: None  Discharge Condition: Stable CODE STATUS: DNR Diet recommendation: Cardiac  HPI:  Valerie Barker is a 89 y.o. female with medical history significant for grade 1 diastolic heart failure with preserved EF 60 to 65%,  memory impairment, hypertension, hyperlipidemia, CVA, restless leg syndrome, IBS, and bladder incontinence presented to for nursing home with complaining of generalized weakness and shortness of breath.  Patient also had 1 episode of bradycardia heart rate dropped to 30.  Patient reported around 6 PM today she has abrupt onset of sensation of racing of her heart with shortness of breath.  Per EMS when they found her she found tachycardic to 120 and their heart rate dropped to 30.  Patient is wearing Zio patch for the similar reason however it was taken off yesterday.  Patient denies any presyncope and syncopal episode.  She denies any chest pain.   Per chart review  patient has recent hospital admission 2 weeks ago for fall and syncope.  Workup unremarkable during that time.  Patient also has an 1 episode of AKI that time as well.  EKG showed supraventricular bigeminy and heart rate 93.   ED Course:  At presentation to ED patient reported to 97.4, heart rate 81, respiratory rate 13, blood pressure 138/57 O2 sat 98% room air. CBC grossly unremarkable. CMP showed sodium 135, potassium 4.1, chloride 101, bicarb 23, blood glucose 103, BUN 23, elevated creatinine 1.15, calcium 9, protein 6.1, GFR 43 anion gap 11. Normal mag 1.9. Troponin 10. EKG showing- Sinus rhythm heart rate 66, multiple ventricular premature complex. Elevated BNP 199. Chest x-ray showing- Stable enlargement of the cardiomediastinal silhouette. Aortic atherosclerotic calcification. Chronic bronchitic changes. No focal consolidation, pleural effusion, or pneumothorax. No displaced rib fractures.  No acute cardiopulmonary process.   In the ED patient received ropinirole 1 mg once.   Hospitalist has been consulted for admission and management.  Subjective: Seen and examined.  She had no complaint.  No dizziness, chest pain, palpitation or shortness of breath.  Brief/Interim Summary: Admitted for following problems.  AKI on CKD stage 3A rule out, she had CKD stage IIIb: She was admitted with a diagnosis of AKI on CKD stage IIIa however upon extensive chart review, it sounds like her baseline creatinine runs anywhere from 0.9-1.2 and her creatinine was 1.15 which was well within her baseline.    Concern for tachycardia/bradycardia syndrome History of frequent PVC and PAC -Patient  presented with complain of shortness of breath and palpitation.  She has heart rate up to 110 and EMS found heart rate dropped to 30s. -Patient has history of PVC and PAC. -Cardiogram from 11/17/2022 showed EF 60 to 65%, grade 1 diastolic heart failure.  Troponins were within normal range. EKG showed sinus rhythm  heart rate 66.  Multiple premature ventricular complexes no ST and T wave abnormality.  There was some concern for tachybradycardia syndrome.  She was seen by cardiology.  She already has Zio patch.  Cardiology did not recommend anything else.  She already has follow-up appointment with cardiology as outpatient and cardiology cleared her for discharge.  Patient remains asymptomatic.    History of hypothyroidism Resume PTA Synthroid.   History of CVA -Continue aspirin 81 mg daily, Plavix 75 mg daily and Lipitor 10 mg daily     History of memory impairment - Continue Aricept 100 mg at bedtime     Restless leg syndrome - Continue ropinirole 2 mg in the afternoon and in the night.  Discharge Diagnoses:  Principal Problem:   Acute kidney injury superimposed on chronic kidney disease (HCC) Active Problems:   Tachycardia-bradycardia syndrome (HCC)   Restless leg syndrome   Hyperlipidemia   History of CVA (cerebrovascular accident)   Memory impairment   Diastolic heart failure with preserved EF 60-65 (HCC)   Essential hypertension   IBS (irritable bowel syndrome)   History of premature ventricular complex   Hypothyroidism   Bradycardia    Discharge Instructions   Allergies as of 12/05/2022       Reactions   Ambien [zolpidem] Other (See Comments)   Unknown reaction   Cymbalta [duloxetine Hcl] Other (See Comments)   Unknown reaction   Desyrel [trazodone] Other (See Comments)   Unknown reaction   Erythromycin Other (See Comments)   Unknown reaction   Remeron [mirtazapine] Other (See Comments)   Unknown reaction   Flexeril [cyclobenzaprine] Anxiety   Valium [diazepam] Rash        Medication List     TAKE these medications    aspirin EC 81 MG tablet Take 1 tablet (81 mg total) by mouth daily. Swallow whole.   atorvastatin 10 MG tablet Commonly known as: LIPITOR Take 1 tablet (10 mg total) by mouth daily.   calcium carbonate 1500 (600 Ca) MG Tabs tablet Commonly  known as: OSCAL Take 600 mg of elemental calcium by mouth daily.   clopidogrel 75 MG tablet Commonly known as: PLAVIX Take 75 mg by mouth daily.   donepezil 10 MG tablet Commonly known as: ARICEPT Take 10 mg by mouth at bedtime.   hydrochlorothiazide 12.5 MG tablet Commonly known as: HYDRODIURIL Take 1 tablet (12.5 mg total) by mouth daily.   levothyroxine 25 MCG tablet Commonly known as: SYNTHROID Take 25-50 mcg by mouth See admin instructions. 25 mcg once daily before breakfast Monday-Saturday. 50 mcg on Sundays.   One-A-Day Womens 50+ Tabs Take 1 tablet by mouth daily.   polyethylene glycol 17 g packet Commonly known as: MIRALAX / GLYCOLAX Take 17 g by mouth daily as needed for mild constipation.   rOPINIRole 1 MG tablet Commonly known as: REQUIP Take 2 mg by mouth See admin instructions. 2 mg every afternoon and 2 mg every evening   trimethoprim 100 MG tablet Commonly known as: TRIMPEX Take 100 mg by mouth every morning.   Vitamin D 50 MCG (2000 UT) Caps Take 2,000 Units by mouth daily.        Follow-up  Information     Valerie Brunette, MD Follow up in 1 week(s).   Specialty: Internal Medicine Contact information: [address] [place] [address] [phone]                Allergies  Allergen Reactions   Ambien [Zolpidem] Other (See Comments)    Unknown reaction   Cymbalta [Duloxetine Hcl] Other (See Comments)    Unknown reaction   Desyrel [Trazodone] Other (See Comments)    Unknown reaction   Erythromycin Other (See Comments)    Unknown reaction   Remeron [Mirtazapine] Other (See Comments)    Unknown reaction   Flexeril [Cyclobenzaprine] Anxiety   Valium [Diazepam] Rash    Consultations: Cardiology   Procedures/Studies: DG Chest Portable 1 View  Result Date: 12/04/2022 CLINICAL DATA:  Weakness and bradycardia EXAM: PORTABLE CHEST 1 VIEW COMPARISON:  Radiograph 11/16/2022 FINDINGS: Stable enlargement of the  cardiomediastinal silhouette. Aortic atherosclerotic calcification. Chronic bronchitic changes. No focal consolidation, pleural effusion, or pneumothorax. No displaced rib fractures. IMPRESSION: No acute cardiopulmonary process. Electronically Signed   By: Minerva Fester M.D.   On: 12/04/2022 21:39   ECHOCARDIOGRAM COMPLETE  Result Date: 11/17/2022    ECHOCARDIOGRAM REPORT   Patient Name:   MIALANI LEAKE [facility] Date of Exam: 11/17/2022 Medical Rec #:  [id number]             Height:       59.0 in Accession #:    [id number]            Weight:       126.0 lb Date of Birth:  29-Jul-1922             BSA:          1.516 m Patient Age:    89 years              BP:           127/100 mmHg Patient Gender: F                     HR:           63 bpm. Exam Location:  Inpatient Procedure: 2D Echo, Cardiac Doppler and Color Doppler Indications:    Syncope R55  History:        Patient has prior history of Echocardiogram examinations, most                 recent 05/17/2022. TIA and Stroke; Risk Factors:Hypertension,                 Dyslipidemia and Non-Smoker.  Sonographer:    Aron Baba Referring Phys: [id number] Cecille Po MELVIN  Sonographer Comments: Image acquisition challenging due to respiratory motion. IMPRESSIONS  1. Left ventricular ejection fraction, by estimation, is 60 to 65%. The left ventricle has normal function. The left ventricle has no regional wall motion abnormalities. Left ventricular diastolic parameters are consistent with Grade I diastolic dysfunction (impaired relaxation).  2. Right ventricular systolic function is normal. The right ventricular size is mildly enlarged. There is mildly elevated pulmonary artery systolic pressure. The estimated right ventricular systolic pressure is 44.8 mmHg.  3. Left atrial size was mildly dilated.  4. Right atrial size was mildly dilated.  5. The mitral valve is normal in structure. Trivial mitral valve regurgitation. No evidence of mitral stenosis.  6. The aortic valve  is tricuspid. There is mild calcification of the aortic valve. Aortic valve regurgitation is not visualized. No  aortic stenosis is present.  7. The inferior vena cava is normal in size with greater than 50% respiratory variability, suggesting right atrial pressure of 3 mmHg. FINDINGS  Left Ventricle: Left ventricular ejection fraction, by estimation, is 60 to 65%. The left ventricle has normal function. The left ventricle has no regional wall motion abnormalities. The left ventricular internal cavity size was normal in size. There is  no left ventricular hypertrophy. Left ventricular diastolic parameters are consistent with Grade I diastolic dysfunction (impaired relaxation). Right Ventricle: The right ventricular size is mildly enlarged. No increase in right ventricular wall thickness. Right ventricular systolic function is normal. There is mildly elevated pulmonary artery systolic pressure. The tricuspid regurgitant velocity is 2.95 m/s, and with an assumed right atrial pressure of 10 mmHg, the estimated right ventricular systolic pressure is 44.8 mmHg. Left Atrium: Left atrial size was mildly dilated. Right Atrium: Right atrial size was mildly dilated. Pericardium: There is no evidence of pericardial effusion. Mitral Valve: The mitral valve is normal in structure. Trivial mitral valve regurgitation. No evidence of mitral valve stenosis. Tricuspid Valve: The tricuspid valve is normal in structure. Tricuspid valve regurgitation is mild . No evidence of tricuspid stenosis. Aortic Valve: The aortic valve is tricuspid. There is mild calcification of the aortic valve. Aortic valve regurgitation is not visualized. No aortic stenosis is present. Pulmonic Valve: The pulmonic valve was normal in structure. Pulmonic valve regurgitation is trivial. No evidence of pulmonic stenosis. Aorta: The aortic root is normal in size and structure. Venous: The inferior vena cava is normal in size with greater than 50% respiratory  variability, suggesting right atrial pressure of 3 mmHg. IAS/Shunts: No atrial level shunt detected by color flow Doppler.  LEFT VENTRICLE PLAX 2D LVIDd:         3.30 cm   Diastology LVIDs:         2.30 cm   LV e' medial:    4.79 cm/s LV PW:         0.90 cm   LV E/e' medial:  18.5 LV IVS:        0.80 cm   LV e' lateral:   5.55 cm/s LVOT diam:     1.90 cm   LV E/e' lateral: 16.0 LV SV:         52 LV SV Index:   34 LVOT Area:     2.84 cm  RIGHT VENTRICLE RV S prime:     8.16 cm/s TAPSE (M-mode): 1.8 cm LEFT ATRIUM           Index        RIGHT ATRIUM           Index LA diam:      3.70 cm 2.44 cm/m   RA Area:     13.50 cm LA Vol (A2C): 32.4 ml 21.38 ml/m  RA Volume:   32.80 ml  21.64 ml/m LA Vol (A4C): 37.5 ml 24.74 ml/m  AORTIC VALVE             PULMONIC VALVE LVOT Vmax:   91.20 cm/s  PR End Diast Vel: 4.84 msec LVOT Vmean:  57.700 cm/s LVOT VTI:    0.184 m  AORTA Ao Root diam: 3.20 cm MITRAL VALVE               TRICUSPID VALVE MV Area (PHT): 3.42 cm    TR Peak grad:   34.8 mmHg MV Decel Time: 222 msec    TR Vmax:  295.00 cm/s MR Peak grad: 1.9 mmHg MR Vmax:      68.65 cm/s   SHUNTS MV E velocity: 88.80 cm/s  Systemic VTI:  0.18 m MV A velocity: 87.30 cm/s  Systemic Diam: 1.90 cm MV E/A ratio:  1.02 Arvilla Meres MD Electronically signed by Arvilla Meres MD Signature Date/Time: 11/17/2022/1:25:40 PM    Final    DG Pelvis 1-2 Views  Result Date: 11/16/2022 CLINICAL DATA:  Pain after fall EXAM: PELVIS - 1 VIEW COMPARISON:  11/15/2021 FINDINGS: Osteopenia there is some concentric joint space loss of both hips. Mild degenerative changes of the sacroiliac joints. No fracture or dislocation. Hyperostosis. Overlapping cardiac leads. Diffuse colonic stool. Presumed vascular calcifications in the pelvis. If there is further concern of subtle nondisplaced injury, additional cross-sectional imaging study may be useful in the setting of osteopenia for higher sensitivity IMPRESSION: Osteopenia.  Degenerative  changes. Electronically Signed   By: Karen Kays M.D.   On: 11/16/2022 10:05   DG Chest Portable 1 View  Result Date: 11/16/2022 CLINICAL DATA:  Pain after fall EXAM: PORTABLE CHEST 1 VIEW COMPARISON:  X-ray 11/02/2022 FINDINGS: Stable borderline cardiopericardial silhouette. Calcified aorta. Chronic interstitial changes are again seen. No pneumothorax, effusion or edema. No consolidation. Overlapping cardiac leads. Calcific tendinitis along the right shoulder seen at the edge of the imaging field. The right inferior costophrenic angle is clipped off the edge of the film. IMPRESSION: Enlarged heart.  Chronic interstitial lung changes. Electronically Signed   By: Karen Kays M.D.   On: 11/16/2022 10:03   CT Lumbar Spine Wo Contrast  Result Date: 11/16/2022 CLINICAL DATA:  Back trauma, no prior imaging (Age >= 16y) EXAM: CT LUMBAR SPINE WITHOUT CONTRAST TECHNIQUE: Multidetector CT imaging of the lumbar spine was performed without intravenous contrast administration. Multiplanar CT image reconstructions were also generated. RADIATION DOSE REDUCTION: This exam was performed according to the departmental dose-optimization program which includes automated exposure control, adjustment of the mA and/or kV according to patient size and/or use of iterative reconstruction technique. COMPARISON:  None Available. FINDINGS: Segmentation: 5 lumbar type vertebrae. Alignment: Grade 1 anterolisthesis of L3 on L4 and L4-L5. Vertebrae: No acute fracture or focal pathologic process. Paraspinal and other soft tissues: Aortic atherosclerotic calcifications. Disc levels: There is a least moderate spinal canal narrowing at L4-L5 (series 7, image 93). IMPRESSION: 1. No acute fracture or traumatic listhesis. 2. At least moderate spinal canal narrowing at L4-L5. Aortic Atherosclerosis (ICD10-I70.0). Electronically Signed   By: Lorenza Cambridge M.D.   On: 11/16/2022 09:51   CT Head Wo Contrast  Result Date: 11/16/2022 CLINICAL DATA:   Head trauma, minor (Age >= 65y); Neck trauma (Age >= 65y) EXAM: CT HEAD WITHOUT CONTRAST CT CERVICAL SPINE WITHOUT CONTRAST TECHNIQUE: Multidetector CT imaging of the head and cervical spine was performed following the standard protocol without intravenous contrast. Multiplanar CT image reconstructions of the cervical spine were also generated. RADIATION DOSE REDUCTION: This exam was performed according to the departmental dose-optimization program which includes automated exposure control, adjustment of the mA and/or kV according to patient size and/or use of iterative reconstruction technique. COMPARISON:  CT head and C Spine 11/02/22 FINDINGS: CT HEAD FINDINGS Brain: No evidence of acute infarction, hemorrhage, hydrocephalus, extra-axial collection or mass lesion/mass effect. There is sequela moderate to severe chronic microvascular ischemic change. Vascular: No hyperdense vessel or unexpected calcification. Skull: Normal. Negative for fracture or focal lesion. Sinuses/Orbits: No middle ear or mastoid effusion. Paranasal sinuses are notable for mild mucosal thickening in  the left sphenoid sinus. Bilateral lens replacement. Orbits are otherwise unremarkable. Other: None. CT CERVICAL SPINE FINDINGS Alignment: Grade 1 anterolisthesis of C3 on C4 and C4 on C5. Skull base and vertebrae: No acute fracture. No primary bone lesion or focal pathologic process. Severe degenerative changes of the left-sided facet joint at C3-C4 (series 9, image 40). Soft tissues and spinal canal: No prevertebral fluid or swelling. No visible canal hematoma. Disc levels:  No CT evidence of high-grade spinal canal stenosis. Upper chest: Negative. Other: No IMPRESSION: 1. No acute intracranial abnormality. 2. No acute fracture or traumatic subluxation of the cervical spine. 3. Severe degenerative changes of the left-sided facet joint at C3-C4. Electronically Signed   By: Lorenza Cambridge M.D.   On: 11/16/2022 09:40   CT Cervical Spine Wo  Contrast  Result Date: 11/16/2022 CLINICAL DATA:  Head trauma, minor (Age >= 65y); Neck trauma (Age >= 65y) EXAM: CT HEAD WITHOUT CONTRAST CT CERVICAL SPINE WITHOUT CONTRAST TECHNIQUE: Multidetector CT imaging of the head and cervical spine was performed following the standard protocol without intravenous contrast. Multiplanar CT image reconstructions of the cervical spine were also generated. RADIATION DOSE REDUCTION: This exam was performed according to the departmental dose-optimization program which includes automated exposure control, adjustment of the mA and/or kV according to patient size and/or use of iterative reconstruction technique. COMPARISON:  CT head and C Spine 11/02/22 FINDINGS: CT HEAD FINDINGS Brain: No evidence of acute infarction, hemorrhage, hydrocephalus, extra-axial collection or mass lesion/mass effect. There is sequela moderate to severe chronic microvascular ischemic change. Vascular: No hyperdense vessel or unexpected calcification. Skull: Normal. Negative for fracture or focal lesion. Sinuses/Orbits: No middle ear or mastoid effusion. Paranasal sinuses are notable for mild mucosal thickening in the left sphenoid sinus. Bilateral lens replacement. Orbits are otherwise unremarkable. Other: None. CT CERVICAL SPINE FINDINGS Alignment: Grade 1 anterolisthesis of C3 on C4 and C4 on C5. Skull base and vertebrae: No acute fracture. No primary bone lesion or focal pathologic process. Severe degenerative changes of the left-sided facet joint at C3-C4 (series 9, image 40). Soft tissues and spinal canal: No prevertebral fluid or swelling. No visible canal hematoma. Disc levels:  No CT evidence of high-grade spinal canal stenosis. Upper chest: Negative. Other: No IMPRESSION: 1. No acute intracranial abnormality. 2. No acute fracture or traumatic subluxation of the cervical spine. 3. Severe degenerative changes of the left-sided facet joint at C3-C4. Electronically Signed   By: Lorenza Cambridge M.D.    On: 11/16/2022 09:40     Discharge Exam: Vitals:   12/05/22 1042 12/05/22 1042  BP:  127/63  Pulse:  72  Resp:  19  Temp: 97.7 F (36.5 C)   SpO2:  100%   Vitals:   12/05/22 0700 12/05/22 0900 12/05/22 1042 12/05/22 1042  BP: 135/89 (!) 141/124  127/63  Pulse: 70 83  72  Resp: 18   19  Temp:   97.7 F (36.5 C)   TempSrc:   Oral   SpO2: 95% 97%  100%  Weight:        General: Pt is alert, awake, not in acute distress Cardiovascular: RRR, S1/S2 +, no rubs, no gallops Respiratory: CTA bilaterally, no wheezing, no rhonchi Abdominal: Soft, NT, ND, bowel sounds + Extremities: no edema, no cyanosis    The results of significant diagnostics from this hospitalization (including imaging, microbiology, ancillary and laboratory) are listed below for reference.     Microbiology: No results found for this or any previous visit (from the  past 240 hour(s)).   Labs: BNP (last 3 results) Recent Labs    12/04/22 2123  BNP 199.2*   Basic Metabolic Panel: Recent Labs  Lab 12/04/22 2123 12/05/22 0603  NA 135 136  K 4.1 3.7  CL 101 104  CO2 23 24  GLUCOSE 103* 96  BUN 23 21  CREATININE 1.15* 1.16*  CALCIUM 9.0 8.7*  MG 1.9  --    Liver Function Tests: Recent Labs  Lab 12/04/22 2123 12/05/22 0603  AST 30 24  ALT 23 21  ALKPHOS 106 96  BILITOT 0.5 0.5  PROT 6.1* 5.8*  ALBUMIN 3.6 3.4*   No results for input(s): "LIPASE", "AMYLASE" in the last 168 hours. No results for input(s): "AMMONIA" in the last 168 hours. CBC: Recent Labs  Lab 12/04/22 2123 12/05/22 0603  WBC 6.7 6.9  NEUTROABS 3.9  --   HGB 12.0 11.5*  HCT 36.5 35.6*  MCV 89.9 92.7  PLT 205 202   Cardiac Enzymes: No results for input(s): "CKTOTAL", "CKMB", "CKMBINDEX", "TROPONINI" in the last 168 hours. BNP: Invalid input(s): "POCBNP" CBG: No results for input(s): "GLUCAP" in the last 168 hours. D-Dimer No results for input(s): "DDIMER" in the last 72 hours. Hgb A1c No results for input(s):  "HGBA1C" in the last 72 hours. Lipid Profile No results for input(s): "CHOL", "HDL", "LDLCALC", "TRIG", "CHOLHDL", "LDLDIRECT" in the last 72 hours. Thyroid function studies Recent Labs    12/04/22 2328  TSH 4.275   Anemia work up No results for input(s): "VITAMINB12", "FOLATE", "FERRITIN", "TIBC", "IRON", "RETICCTPCT" in the last 72 hours. Urinalysis    Component Value Date/Time   COLORURINE YELLOW 04/17/2018 1340   APPEARANCEUR HAZY (A) 04/17/2018 1340   LABSPEC 1.009 04/17/2018 1340   PHURINE 7.0 04/17/2018 1340   GLUCOSEU NEGATIVE 04/17/2018 1340   HGBUR SMALL (A) 04/17/2018 1340   BILIRUBINUR NEGATIVE 04/17/2018 1340   KETONESUR NEGATIVE 04/17/2018 1340   PROTEINUR NEGATIVE 04/17/2018 1340   UROBILINOGEN 0.2 04/27/2009 1643   NITRITE NEGATIVE 04/17/2018 1340   LEUKOCYTESUR TRACE (A) 04/17/2018 1340   Sepsis Labs Recent Labs  Lab 12/04/22 2123 12/05/22 0603  WBC 6.7 6.9   Microbiology No results found for this or any previous visit (from the past 240 hour(s)).  FURTHER DISCHARGE INSTRUCTIONS:   Get Medicines reviewed and adjusted: Please take all your medications with you for your next visit with your Primary MD   Laboratory/radiological data: Please request your Primary MD to go over all hospital tests and procedure/radiological results at the follow up, please ask your Primary MD to get all Hospital records sent to his/her office.   In some cases, they will be blood work, cultures and biopsy results pending at the time of your discharge. Please request that your primary care M.D. goes through all the records of your hospital data and follows up on these results.   Also Note the following: If you experience worsening of your admission symptoms, develop shortness of breath, life threatening emergency, suicidal or homicidal thoughts you must seek medical attention immediately by calling 911 or calling your MD immediately  if symptoms less severe.   You must read  complete instructions/literature along with all the possible adverse reactions/side effects for all the Medicines you take and that have been prescribed to you. Take any new Medicines after you have completely understood and accpet all the possible adverse reactions/side effects.    Do not drive when taking Pain medications or sleeping medications (Benzodaizepines)   Do not  take more than prescribed Pain, Sleep and Anxiety Medications. It is not advisable to combine anxiety,sleep and pain medications without talking with your primary care practitioner   Special Instructions: If you have smoked or chewed Tobacco  in the last 2 yrs please stop smoking, stop any regular Alcohol  and or any Recreational drug use.   Wear Seat belts while driving.   Please note: You were cared for by a hospitalist during your hospital stay. Once you are discharged, your primary care physician will handle any further medical issues. Please note that NO REFILLS for any discharge medications will be authorized once you are discharged, as it is imperative that you return to your primary care physician (or establish a relationship with a primary care physician if you do not have one) for your post hospital discharge needs so that they can reassess your need for medications and monitor your lab values  Time coordinating discharge: Over 30 minutes  SIGNED:   Hughie Closs, MD  [facility] 12/05/2022, 12:24 PM *Please note that this is a verbal dictation therefore any spelling or grammatical errors are due to the "Dragon Medical One" system interpretation. If 7PM-7AM, please contact night-coverage [email or web address]

## 2022-12-05 NOTE — Care Management Obs Status (Signed)
MEDICARE OBSERVATION STATUS NOTIFICATION   Patient Details  Name: Valerie Barker MRN: [id number] Date of Birth: October 16, 1922   Medicare Observation Status Notification Given:  Yes    Lavenia Atlas, RN 12/05/2022, 12:55 PM

## 2022-12-05 NOTE — Care Management CC44 (Signed)
Condition Code 44 Documentation Completed  Patient Details  Name: Valerie Barker MRN: [id number] Date of Birth: [birth year]   Condition Code 44 given:  Yes Patient signature on Condition Code 44 notice:  Yes Documentation of 2 MD's agreement:  Yes Code 44 added to claim:  Yes    Lavenia Atlas, RN 12/05/2022, 1:37 PM

## 2022-12-05 NOTE — Consult Note (Addendum)
Cardiology Consultation   Patient ID: Valerie Barker MRN: [id number]; DOB: 1922/09/10  Admit date: 12/04/2022 Date of Consult: 12/05/2022  PCP:  Merri Brunette, MD   [facility] Providers Cardiologist:  Nicki Guadalajara, MD   {  Patient Profile:   Valerie Barker is a 89 y.o. female with a hx of TIA/lacunar infarcts, hypertension, hyperlipidemia, frequent PACs, bradycardia (no beta-blocker), moderate TR  who is being seen 12/05/2022 for the evaluation of possible tachybradycardia syndrome at the request of Dr. Jacqulyn Bath.  History of Present Illness:   Valerie Barker is followed by Dr. Tresa Endo and most recently seen October 25, 2022.  She has history of TIAs dating back to 2014 however no evidence of acute infarctions.  She has been chronically on Plavix. Aspirin has been discontinued due to frequent issues with falling.  She has had normal EF throughout the years with some valvular disease noted above, however based on most recent echocardiogram obtained earlier this month's admission shows trivial TR.  Also has known history of sinus bradycardia so beta-blockers have been discontinued.  Chronically she has had some degree of shortness of breath however ambulates well with mechanical assistance.  She is a very pleasant lady however does have some memory issues and cognitive deficits.  Today she is accompanied with her care taker.    Recently patient was admitted to the hospital on 11/16/2022 due to an unwitnessed fall.  Overall workup was unremarkable and she was discharged on a 2-week cardiac monitor that she has recently completed and will be mailing back to our office.  Of note she does have history of falls in aspirin was discontinued because of this also with concurrent use of Plavix for her TIAs.  She also had mildly elevated troponins that were flat with out any anginal symptoms that was treated conservatively.  Echocardiogram did not show any significant  abnormalities.  Today she presents with her caregiver due to complaints of feeling unwell when awakening yesterday morning.  Per patient yesterday she woke up and stated that she felt "woozy".  She states that she felt her heart palpating and also felt racing heart rates along with a palpation like sensation in her left radial artery.  She also stated that she had a pulsatile feeling in her head. Per caregiver this is a chronic complaint and she often feels palpitations and racing heart rates either immediately after waking up or after a nap.  She denies any accompanying symptoms such as shortness of breath, chest pain, diaphoresis, nausea, peripheral edema.  During one of her episodes yesterday she went to go take her blood pressure however the battery was dead and so patient was worried and called her son who called EMS.  Per EMS she had reported possible episodes of bradycardia in the 30s as well as heart rates rising to the 120s.  Cardiology has been asked to further evaluate this.   Overall lab work unremarkable.  Mildly elevated creatinine that looks stable 1.15.  BNP 199.  EKG without any acute ischemic ST-T wave changes.  Negative chest x-ray.  Past Medical History:  Diagnosis Date   Acute urinary retention 04/17/2018   Heart disease    Hypertension    IBS (irritable bowel syndrome)    Stroke (HCC) 04/17/2018   around 2011   TIA (transient ischemic attack)     Past Surgical History:  Procedure Laterality Date   ABDOMINAL HYSTERECTOMY  1978   BUNIONECTOMY  2006, 1996   LAPAROSCOPIC  SALPINGO OOPHERECTOMY  1988      Inpatient Medications: Scheduled Meds:  aspirin EC  81 mg Oral Daily   atorvastatin  10 mg Oral Daily   clopidogrel  75 mg Oral Daily   [START ON 12/06/2022] donepezil  10 mg Oral QHS   enoxaparin (LOVENOX) injection  30 mg Subcutaneous Q24H   levothyroxine  25 mcg Oral Once per day on Monday Tuesday Wednesday Thursday Friday Saturday   [START ON 12/11/2022]  levothyroxine  50 mcg Oral Q Sun   rOPINIRole  2 mg Oral BID AC   sodium chloride flush  3 mL Intravenous Q12H   Continuous Infusions:  sodium chloride     lactated ringers 75 mL/hr at 12/05/22 0206   PRN Meds: sodium chloride, acetaminophen **OR** acetaminophen, ondansetron **OR** ondansetron (ZOFRAN) IV, senna-docusate, sodium chloride flush  Allergies:    Allergies  Allergen Reactions   Ambien [Zolpidem] Other (See Comments)    Unknown reaction   Cymbalta [Duloxetine Hcl] Other (See Comments)    Unknown reaction   Desyrel [Trazodone] Other (See Comments)    Unknown reaction   Erythromycin Other (See Comments)    Unknown reaction   Remeron [Mirtazapine] Other (See Comments)    Unknown reaction   Flexeril [Cyclobenzaprine] Anxiety   Valium [Diazepam] Rash    Social History:   Social History   Socioeconomic History   Marital status: Married    Spouse name: Not on file   Number of children: Not on file   Years of education: Not on file   Highest education level: Not on file  Occupational History   Not on file  Tobacco Use   Smoking status: Never   Smokeless tobacco: Never  Vaping Use   Vaping status: Never Used  Substance and Sexual Activity   Alcohol use: No   Drug use: No   Sexual activity: Not Currently  Other Topics Concern   Not on file  Social History Narrative   Lives in Independent [facility] at [place].  Education BA degree.  Retired.  3 children.  Son, Combs.  Caffeine 1=2 cups daily.   Social Determinants of Health   Financial Resource Strain: Not on file  Food Insecurity: No Food Insecurity (11/16/2022)   Hunger Vital Sign    Worried About Running Out of Food in the Last Year: Never true    Ran Out of Food in the Last Year: Never true  Transportation Needs: No Transportation Needs (11/16/2022)   PRAPARE - Administrator, Civil Service (Medical): No    Lack of Transportation (Non-Medical): No  Physical Activity: Not on file  Stress:  Not on file  Social Connections: Not on file  Intimate Partner Violence: Not At Risk (11/16/2022)   Humiliation, Afraid, Rape, and Kick questionnaire    Fear of Current or Ex-Partner: No    Emotionally Abused: No    Physically Abused: No    Sexually Abused: No    Family History:    Family History  Problem Relation Age of Onset   COPD Mother    Schizophrenia Brother    Stroke Father    Breast cancer Neg Hx      ROS:  Please see the history of present illness.   All other ROS reviewed and negative.     Physical Exam/Data:   Vitals:   12/05/22 0500 12/05/22 0600 12/05/22 0700 12/05/22 0900  BP: 129/76 132/73 135/89 (!) 141/124  Pulse: 68 (!) 50 70 83  Resp:  18  18   Temp:  97.6 F (36.4 C)    TempSrc:      SpO2: 97% 100% 95% 97%  Weight:       No intake or output data in the 24 hours ending 12/05/22 1038    12/04/2022    8:14 PM 11/17/2022    3:53 AM 11/16/2022    8:49 AM  Last 3 Weights  Weight (lbs) 125 lb 126 lb 125 lb  Weight (kg) 56.7 kg 57.153 kg 56.7 kg     Body mass index is 25.25 kg/m.  General:  Well nourished, well developed, in no acute distress HEENT: normal Neck: no JVD Vascular: No carotid bruits; Distal pulses 2+ bilaterally Cardiac:  normal S1, S2; RRR; no murmur  Lungs:  clear to auscultation bilaterally, no wheezing, rhonchi or rales  Abd: soft, nontender, no hepatomegaly  Ext: no edema Musculoskeletal:  No deformities, BUE and BLE strength normal and equal Skin: warm and dry  Neuro:  CNs 2-12 intact, no focal abnormalities noted Psych:  Normal affect   EKG:  The EKG was personally reviewed and demonstrates: Normal sinus rhythm, heart rate 66.  PVCs.  No significant changes from prior reading. Telemetry:  Telemetry was personally reviewed and demonstrates: Normal sinus rhythm with frequent ectopy.    Relevant CV Studies: Echocardiogram 11/17/2022  1. Left ventricular ejection fraction, by estimation, is 60 to 65%. The  left ventricle has  normal function. The left ventricle has no regional  wall motion abnormalities. Left ventricular diastolic parameters are  consistent with Grade I diastolic  dysfunction (impaired relaxation).   2. Right ventricular systolic function is normal. The right ventricular  size is mildly enlarged. There is mildly elevated pulmonary artery  systolic pressure. The estimated right ventricular systolic pressure is  44.8 mmHg.   3. Left atrial size was mildly dilated.   4. Right atrial size was mildly dilated.   5. The mitral valve is normal in structure. Trivial mitral valve  regurgitation. No evidence of mitral stenosis.   6. The aortic valve is tricuspid. There is mild calcification of the  aortic valve. Aortic valve regurgitation is not visualized. No aortic  stenosis is present.   7. The inferior vena cava is normal in size with greater than 50%  respiratory variability, suggesting right atrial pressure of 3 mmHg.    Laboratory Data:  High Sensitivity Troponin:   Recent Labs  Lab 11/16/22 1050 11/16/22 1452 11/16/22 1734 12/04/22 2123 12/04/22 2328  TROPONINIHS 151* 117* 101* 10 10     Chemistry Recent Labs  Lab 12/04/22 2123 12/05/22 0603  NA 135 136  K 4.1 3.7  CL 101 104  CO2 23 24  GLUCOSE 103* 96  BUN 23 21  CREATININE 1.15* 1.16*  CALCIUM 9.0 8.7*  MG 1.9  --   GFRNONAA 43* 43*  ANIONGAP 11 8    Recent Labs  Lab 12/04/22 2123 12/05/22 0603  PROT 6.1* 5.8*  ALBUMIN 3.6 3.4*  AST 30 24  ALT 23 21  ALKPHOS 106 96  BILITOT 0.5 0.5   Lipids No results for input(s): "CHOL", "TRIG", "HDL", "LABVLDL", "LDLCALC", "CHOLHDL" in the last 168 hours.  Hematology Recent Labs  Lab 12/04/22 2123 12/05/22 0603  WBC 6.7 6.9  RBC 4.06 3.84*  HGB 12.0 11.5*  HCT 36.5 35.6*  MCV 89.9 92.7  MCH 29.6 29.9  MCHC 32.9 32.3  RDW 14.1 14.3  PLT 205 202   Thyroid  Recent Labs  Lab 12/04/22 2328  TSH 4.275    BNP Recent Labs  Lab 12/04/22 2123  BNP 199.2*     DDimer No results for input(s): "DDIMER" in the last 168 hours.   Radiology/Studies:  DG Chest Portable 1 View  Result Date: 12/04/2022 CLINICAL DATA:  Weakness and bradycardia EXAM: PORTABLE CHEST 1 VIEW COMPARISON:  Radiograph 11/16/2022 FINDINGS: Stable enlargement of the cardiomediastinal silhouette. Aortic atherosclerotic calcification. Chronic bronchitic changes. No focal consolidation, pleural effusion, or pneumothorax. No displaced rib fractures. IMPRESSION: No acute cardiopulmonary process. Electronically Signed   By: Minerva Fester M.D.   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  This has not been observed on telemetry or EKGs while here in the hospital.  HR has been 60s-70s here. She does have a 2-week monitor that was placed from her most recent admission for a unwitnessed fall earlier this month that will be sent off soon.  Per caregiver patient this is chronic and frequently has these complaints of palpitations whenever she wakes up.  There have been no other concerning associated symptoms.  I have some suspicion that this may be natural and given to elevated cortisol levels when awakening.  Additionally, she does have a fair amount of ectopy that may also be related to her palpitations.  Currently there is no need for further evaluation inpatient.  We can follow-up on the ZIO monitor results for further recommendations.  Likely would not be a PPM candidate due to age and frailty   History of CVA On Plavix chronically.  Continue statin.  Will discontinue aspirin due to high fall risk and recurrent issues previously.  Valvular disease History of moderate PR/TR.  Most recent echocardiogram shows normal EF 60 to 65% with normal RV function.  Mildly elevated RVSP 44.8.   Trivial MR.  Mild TR.  Trivial PR.  CKD Overall looks stable.   Risk Assessment/Risk Scores:    For questions or updates, please contact [facility] Please consult [email or web address] for contact info under    Signed, Abagail Kitchens, PA-C  12/05/2022 10:38 AM   History and all data above reviewed.  Patient examined.  I agree with the findings as above.  The patient is very pleasant.  She has some mild dementia.  She has almost 24/7 caregivers who keep her from falling.  She gets around in her room with a walker and is prone to lightheadedness when she standsup too quickly.  The patient denies any new symptoms such as chest discomfort, neck or arm discomfort. There has been no new shortness of breath, PND or orthopnea. There have been no reported  presyncope or syncope.     She did have palpitations and EMS was called.  She was reported to have a HR in the 30s but no hypotension.  In the ED she has atrial bigeminy but no sinus brady or pauses.  BPs have been stable.  Labs unremarkable except for mildly elevated creat.  She reports that she otherwise has been doing OK at home eating well and drinking.   The patient exam reveals COR:RRR, frequent ectopy  ,  Lungs: Clear  ,  Abd: Positive bowel sounds, no rebound no guarding, Ext No edema  .  All available labs, radiology testing, previous records reviewed. Agree with documented assessment  and plan.   Arrhythmia:  I suspect the recorded HR of 30s was atrial bigeminy that was not counted correctly.  She has a monitor that she wore recently and turned in and we can follow up on this.  However, I don't think that she has any bradyarrhythmia that will require treatment.  She does likely have some orthostatic symptoms and I talked to her and her caregiver about this.  She needs to practice caution when changing positions and would probably reduce risk with a bedside toilet.  I don' think any any other in patient work up is needed.  She has scheduled out  patient follow up in our office next week and we can see if she has had any recurrent symptoms at that time.    Fayrene Fearing Laresa Oshiro  12:05 PM  12/05/2022

## 2022-12-05 NOTE — Progress Notes (Signed)
OT NOTE  Occupational Therapy Discharge Patient Details Name: Valerie Barker MRN: [id number] DOB: 07-05-1922 Today's Date: 12/05/2022 Time:  -     Patient discharged from OT services secondary to  near baseline. PT [place] communicated to OT staff that patient is near baseline and will screen the evaluation at this time.  .  Please see latest therapy progress note for current level of functioning and progress toward goals.    Progress and discharge plan discussed with patient and/or caregiver: Patient/Caregiver agrees with plan  GO     Mateo Flow 12/05/2022, 11:31 AM

## 2022-12-05 NOTE — ED Provider Notes (Incomplete)
[place] EMERGENCY DEPARTMENT AT [facility] Provider Note   CSN: [id number] Arrival date & time: 12/04/22  2011     History {Add pertinent medical, surgical, social history, OB history to HPI:1} Chief Complaint  Patient presents with  . Weakness  . Bradycardia    Valerie Barker is a 89 y.o. female.   Weakness  Patient reports that earlier today she was at her assisted living facility when she felt her heart began to race.  She began to feel short of breath associated with this.  This was ongoing and that she had associated weakness she called for help.  EMS arrived and her initial heart rate was alternating between the 30s and 120s.  She did have stable blood pressure on their assessment.  She reports that her symptoms have overall resolved but she does have some ongoing weakness.    Home Medications Prior to Admission medications   Medication Sig Start Date End Date Taking? Authorizing Provider  aspirin EC 81 MG tablet Take 1 tablet (81 mg total) by mouth daily. Swallow whole. 11/19/22  Yes Berton Mount I, MD  atorvastatin (LIPITOR) 10 MG tablet Take 1 tablet (10 mg total) by mouth daily. 11/19/22  Yes Berton Mount I, MD  calcium carbonate (OSCAL) 1500 (600 Ca) MG TABS tablet Take 600 mg of elemental calcium by mouth daily.   Yes [provider]  Cholecalciferol (VITAMIN D) 2000 UNITS CAPS Take 2,000 Units by mouth daily.   Yes [provider]  clopidogrel (PLAVIX) 75 MG tablet Take 75 mg by mouth daily.   Yes [provider]  donepezil (ARICEPT) 10 MG tablet Take 10 mg by mouth at bedtime. 10/05/22  Yes [provider]  hydrochlorothiazide (HYDRODIURIL) 12.5 MG tablet Take 1 tablet (12.5 mg total) by mouth daily. 11/19/22  Yes Barnetta Chapel, MD  levothyroxine (SYNTHROID) 25 MCG tablet Take 25-50 mcg by mouth See admin instructions. 25 mcg once daily before breakfast Monday-Saturday. 50 mcg on Sundays.   Yes  [provider]  polyethylene glycol (MIRALAX / GLYCOLAX) 17 g packet Take 17 g by mouth daily as needed for mild constipation. 11/18/22  Yes Berton Mount I, MD  rOPINIRole (REQUIP) 1 MG tablet Take 2 mg by mouth See admin instructions. 2 mg every afternoon and 2 mg every evening 05/26/18  Yes [provider]  trimethoprim (TRIMPEX) 100 MG tablet Take 100 mg by mouth every morning. 11/24/22  Yes [provider]  Multiple Vitamins-Minerals (ONE-A-DAY WOMENS 50+) TABS Take 1 tablet by mouth daily.    [provider]      Allergies    Ambien [zolpidem], Cymbalta [duloxetine hcl], Desyrel [trazodone], Erythromycin, Remeron [mirtazapine], Flexeril [cyclobenzaprine], and Valium [diazepam]    Review of Systems   Review of Systems  Neurological:  Positive for weakness.    Physical Exam Updated Vital Signs BP (!) 146/83   Pulse (!) 110   Temp 97.7 F (36.5 C) (Oral)   Resp 17   Wt 56.7 kg   SpO2 99%   BMI 25.25 kg/m  Physical Exam Vitals and nursing note reviewed.  Constitutional:      General: She is not in acute distress.    Appearance: She is well-developed.  HENT:     Head: Normocephalic and atraumatic.  Eyes:     Conjunctiva/sclera: Conjunctivae normal.  Cardiovascular:     Rate and Rhythm: Normal rate. Rhythm irregular.     Heart sounds: No murmur heard. Pulmonary:  Effort: Pulmonary effort is normal. No respiratory distress.     Breath sounds: Normal breath sounds.  Abdominal:     Palpations: Abdomen is soft.     Tenderness: There is no abdominal tenderness.  Musculoskeletal:        General: No swelling.     Cervical back: Neck supple.     Right lower leg: No edema.     Left lower leg: No edema.  Skin:    General: Skin is warm and dry.     Capillary Refill: Capillary refill takes less than 2 seconds.  Neurological:     Mental Status: She is alert.  Psychiatric:        Mood and Affect: Mood normal.     ED Results /  Procedures / Treatments   Labs (all labs ordered are listed, but only abnormal results are displayed) Labs Reviewed  COMPREHENSIVE METABOLIC PANEL - Abnormal; Notable for the following components:      Result Value   Glucose, Bld 103 (*)    Creatinine, Ser 1.15 (*)    Total Protein 6.1 (*)    GFR, Estimated 43 (*)    All other components within normal limits  BRAIN NATRIURETIC PEPTIDE - Abnormal; Notable for the following components:   B Natriuretic Peptide 199.2 (*)    All other components within normal limits  COMPREHENSIVE METABOLIC PANEL - Abnormal; Notable for the following components:   Creatinine, Ser 1.16 (*)    Calcium 8.7 (*)    Total Protein 5.8 (*)    Albumin 3.4 (*)    GFR, Estimated 43 (*)    All other components within normal limits  CBC - Abnormal; Notable for the following components:   RBC 3.84 (*)    Hemoglobin 11.5 (*)    HCT 35.6 (*)    All other components within normal limits  CBC WITH DIFFERENTIAL/PLATELET  MAGNESIUM  TSH  CREATININE, URINE, RANDOM  SODIUM, URINE, RANDOM  TROPONIN I (HIGH SENSITIVITY)  TROPONIN I (HIGH SENSITIVITY)    EKG EKG Interpretation Date/Time:  Sunday December 04 2022 22:20:30 EDT Ventricular Rate:  66 PR Interval:  139 QRS Duration:  96 QT Interval:  414 QTC Calculation: 434 R Axis:   14  Text Interpretation: Sinus rhythm Multiple ventricular premature complexes No significant change since last tracing Confirmed by Gwyneth Sprout ([id number]) on 12/04/2022 10:39:24 PM  Radiology DG Chest Portable 1 View  Result Date: 12/04/2022 CLINICAL DATA:  Weakness and bradycardia EXAM: PORTABLE CHEST 1 VIEW COMPARISON:  Radiograph 11/16/2022 FINDINGS: Stable enlargement of the cardiomediastinal silhouette. Aortic atherosclerotic calcification. Chronic bronchitic changes. No focal consolidation, pleural effusion, or pneumothorax. No displaced rib fractures. IMPRESSION: No acute cardiopulmonary process. Electronically Signed   By:  Minerva Fester M.D.   On: 12/04/2022 21:39    Procedures Procedures  {Document cardiac monitor, telemetry assessment procedure when appropriate:1}  Medications Ordered in ED Medications  rOPINIRole (REQUIP) tablet 1 mg (1 mg Oral Given 12/04/22 2346)    ED Course/ Medical Decision Making/ A&P   {   Click here for ABCD2, HEART and other calculatorsREFRESH Note before signing :1}                              Medical Decision Making Amount and/or Complexity of Data Reviewed Labs: ordered. Radiology: ordered.  Risk Prescription drug management.   Patient is a 89 year old female with a history of hypertension, hypothyroidism, hyperlipidemia presenting for weakness.  On my initial evaluation, she is afebrile, hemodynamically stable, in no acute distress.  Reports an episode of heart racing with associated weakness that occurred earlier today.  On my exam, her heart rate is irregular but she does have 2+ pulses in all 4 distal extremities.  Presentation is most concerning for potential arrhythmia, EKG obtained.  Will additionally obtain labs to evaluate for electrolyte abnormality, metabolic abnormality, infection.  Less concern for ACS however will obtain troponin.  Results reviewed.  CBC without leukocytosis or anemia.  CMP with overall normal electrolytes, no AKI or anion gap.  Initial troponin 10.  BNP is elevated to 199.  Chest x-ray without acute cardiopulmonary abnormality.  I personally reviewed patient's EKG and do not note ST elevations or depressions to suggest ischemia.  She does have PVCs.  No high-grade conduction block noted.  Given patient's symptomatic tachycardia earlier today as well as newly elevated BNP, feel that she would benefit from admission for further management.  Discussed with admitting team.  Patient admitted without further acute event while under my care in the emergency department.  {Document critical care time when appropriate:1} {Document review of labs  and clinical decision tools ie heart score, Chads2Vasc2 etc:1}  {Document your independent review of radiology images, and any outside records:1} {Document your discussion with family members, caretakers, and with consultants:1} {Document social determinants of health affecting pt's care:1} {Document your decision making why or why not admission, treatments were needed:1} Final Clinical Impression(s) / ED Diagnoses Final diagnoses:  None    Rx / DC Orders ED Discharge Orders     None

## 2022-12-08 ENCOUNTER — Encounter: Payer: Self-pay | Admitting: Nurse Practitioner

## 2022-12-08 ENCOUNTER — Ambulatory Visit: Payer: Medicare Other | Attending: Nurse Practitioner | Admitting: Nurse Practitioner

## 2022-12-08 VITALS — BP 120/62 | HR 74 | Ht 59.0 in | Wt 126.6 lb

## 2022-12-08 DIAGNOSIS — I340 Nonrheumatic mitral (valve) insufficiency: Secondary | ICD-10-CM | POA: Diagnosis not present

## 2022-12-08 DIAGNOSIS — Z8673 Personal history of transient ischemic attack (TIA), and cerebral infarction without residual deficits: Secondary | ICD-10-CM | POA: Diagnosis not present

## 2022-12-08 DIAGNOSIS — I491 Atrial premature depolarization: Secondary | ICD-10-CM | POA: Diagnosis not present

## 2022-12-08 DIAGNOSIS — I071 Rheumatic tricuspid insufficiency: Secondary | ICD-10-CM | POA: Diagnosis not present

## 2022-12-08 DIAGNOSIS — E785 Hyperlipidemia, unspecified: Secondary | ICD-10-CM

## 2022-12-08 DIAGNOSIS — N1832 Chronic kidney disease, stage 3b: Secondary | ICD-10-CM | POA: Diagnosis not present

## 2022-12-08 DIAGNOSIS — I493 Ventricular premature depolarization: Secondary | ICD-10-CM

## 2022-12-08 DIAGNOSIS — I10 Essential (primary) hypertension: Secondary | ICD-10-CM | POA: Diagnosis not present

## 2022-12-08 DIAGNOSIS — I495 Sick sinus syndrome: Secondary | ICD-10-CM

## 2022-12-08 NOTE — Progress Notes (Signed)
Office Visit    Patient Name: Valerie Barker Date of Encounter: 12/08/2022  Primary Care Provider:  Merri Brunette, MD Primary Cardiologist:  Nicki Guadalajara, MD  Chief Complaint    89 year old female with a history of  valvular heart disease, frequent PACs, bradycardia, hypertension, hyperlipidemia, TIA,  hypothyroidism, memory impairment, restless leg syndrome, and CKD stage IIIb who presents for hospital follow-up related to bradycardia.  Past Medical History    Past Medical History:  Diagnosis Date   Acute urinary retention 04/17/2018   Heart disease    Hypertension    IBS (irritable bowel syndrome)    Stroke (HCC) 04/17/2018   around 2011   TIA (transient ischemic attack)    Past Surgical History:  Procedure Laterality Date   ABDOMINAL HYSTERECTOMY  1978   BUNIONECTOMY  2006, 1996   LAPAROSCOPIC SALPINGO OOPHERECTOMY  1988    Allergies  Allergies  Allergen Reactions   Ambien [Zolpidem] Other (See Comments)    Unknown reaction   Cymbalta [Duloxetine Hcl] Other (See Comments)    Unknown reaction   Desyrel [Trazodone] Other (See Comments)    Unknown reaction   Erythromycin Other (See Comments)    Unknown reaction   Remeron [Mirtazapine] Other (See Comments)    Unknown reaction   Flexeril [Cyclobenzaprine] Anxiety   Valium [Diazepam] Rash     Labs/Other Studies Reviewed    The following studies were reviewed today:  Cardiac Studies & Procedures     STRESS TESTS  MYOCARDIAL PERFUSION IMAGING 08/13/2010   ECHOCARDIOGRAM  ECHOCARDIOGRAM COMPLETE 11/17/2022  Narrative ECHOCARDIOGRAM REPORT    Patient Name:   Valerie Barker Date of Exam: 11/17/2022 Medical Rec #:  [id number]             Height:       59.0 in Accession #:    [id number]            Weight:       126.0 lb Date of Birth:  December 01, 1922             BSA:          1.516 m Patient Age:    89 years              BP:           127/100 mmHg Patient Gender: F                     HR:            63 bpm. Exam Location:  Inpatient  Procedure: 2D Echo, Cardiac Doppler and Color Doppler  Indications:    Syncope R55  History:        Patient has prior history of Echocardiogram examinations, most recent 05/17/2022. TIA and Stroke; Risk Factors:Hypertension, Dyslipidemia and Non-Smoker.  Sonographer:    Aron Baba Referring Phys: [id number] Cecille Po MELVIN   Sonographer Comments: Image acquisition challenging due to respiratory motion. IMPRESSIONS   1. Left ventricular ejection fraction, by estimation, is 60 to 65%. The left ventricle has normal function. The left ventricle has no regional wall motion abnormalities. Left ventricular diastolic parameters are consistent with Grade I diastolic dysfunction (impaired relaxation). 2. Right ventricular systolic function is normal. The right ventricular size is mildly enlarged. There is mildly elevated pulmonary artery systolic pressure. The estimated right ventricular systolic pressure is 44.8 mmHg. 3. Left atrial size was mildly dilated. 4. Right atrial size was mildly dilated. 5. The mitral valve is normal  in structure. Trivial mitral valve regurgitation. No evidence of mitral stenosis. 6. The aortic valve is tricuspid. There is mild calcification of the aortic valve. Aortic valve regurgitation is not visualized. No aortic stenosis is present. 7. The inferior vena cava is normal in size with greater than 50% respiratory variability, suggesting right atrial pressure of 3 mmHg.  FINDINGS Left Ventricle: Left ventricular ejection fraction, by estimation, is 60 to 65%. The left ventricle has normal function. The left ventricle has no regional wall motion abnormalities. The left ventricular internal cavity size was normal in size. There is no left ventricular hypertrophy. Left ventricular diastolic parameters are consistent with Grade I diastolic dysfunction (impaired relaxation).  Right Ventricle: The right ventricular size is mildly  enlarged. No increase in right ventricular wall thickness. Right ventricular systolic function is normal. There is mildly elevated pulmonary artery systolic pressure. The tricuspid regurgitant velocity is 2.95 m/s, and with an assumed right atrial pressure of 10 mmHg, the estimated right ventricular systolic pressure is 44.8 mmHg.  Left Atrium: Left atrial size was mildly dilated.  Right Atrium: Right atrial size was mildly dilated.  Pericardium: There is no evidence of pericardial effusion.  Mitral Valve: The mitral valve is normal in structure. Trivial mitral valve regurgitation. No evidence of mitral valve stenosis.  Tricuspid Valve: The tricuspid valve is normal in structure. Tricuspid valve regurgitation is mild . No evidence of tricuspid stenosis.  Aortic Valve: The aortic valve is tricuspid. There is mild calcification of the aortic valve. Aortic valve regurgitation is not visualized. No aortic stenosis is present.  Pulmonic Valve: The pulmonic valve was normal in structure. Pulmonic valve regurgitation is trivial. No evidence of pulmonic stenosis.  Aorta: The aortic root is normal in size and structure.  Venous: The inferior vena cava is normal in size with greater than 50% respiratory variability, suggesting right atrial pressure of 3 mmHg.  IAS/Shunts: No atrial level shunt detected by color flow Doppler.   LEFT VENTRICLE PLAX 2D LVIDd:         3.30 cm   Diastology LVIDs:         2.30 cm   LV e' medial:    4.79 cm/s LV PW:         0.90 cm   LV E/e' medial:  18.5 LV IVS:        0.80 cm   LV e' lateral:   5.55 cm/s LVOT diam:     1.90 cm   LV E/e' lateral: 16.0 LV SV:         52 LV SV Index:   34 LVOT Area:     2.84 cm   RIGHT VENTRICLE RV S prime:     8.16 cm/s TAPSE (M-mode): 1.8 cm  LEFT ATRIUM           Index        RIGHT ATRIUM           Index LA diam:      3.70 cm 2.44 cm/m   RA Area:     13.50 cm LA Vol (A2C): 32.4 ml 21.38 ml/m  RA Volume:   32.80 ml   21.64 ml/m LA Vol (A4C): 37.5 ml 24.74 ml/m AORTIC VALVE             PULMONIC VALVE LVOT Vmax:   91.20 cm/s  PR End Diast Vel: 4.84 msec LVOT Vmean:  57.700 cm/s LVOT VTI:    0.184 m  AORTA Ao Root diam: 3.20 cm  MITRAL  VALVE               TRICUSPID VALVE MV Area (PHT): 3.42 cm    TR Peak grad:   34.8 mmHg MV Decel Time: 222 msec    TR Vmax:        295.00 cm/s MR Peak grad: 1.9 mmHg MR Vmax:      68.65 cm/s   SHUNTS MV E velocity: 88.80 cm/s  Systemic VTI:  0.18 m MV A velocity: 87.30 cm/s  Systemic Diam: 1.90 cm MV E/A ratio:  1.02  Arvilla Meres MD Electronically signed by Arvilla Meres MD Signature Date/Time: 11/17/2022/1:25:40 PM    Final    MONITORS  CARDIAC EVENT MONITOR 05/03/2018  Narrative The patient was monitored from January 30 through June 01, 2018.  The predominant rhythm was sinus rhythm.  The average heart rate was 72 bpm.  The minimum heart rate was sinus bradycardia at 54 bpm which occurred at 12:45 AM, most likely while sleeping.  The maximal heart rate was sinus tachycardia at 116 bpm.  There was one 3 beat atrial run at 120 bpm.  There was a very rare isolated unifocal PVC.  There were no episodes of atrial fibrillation or SVT.  There were no pauses.  There were no episodes of ventricular tachycardia.  Started to warm up little bit sounds actually pretty nice          Recent Labs: 12/04/2022: B Natriuretic Peptide 199.2; Magnesium 1.9; TSH 4.275 12/05/2022: ALT 21; BUN 21; Creatinine, Ser 1.16; Hemoglobin 11.5; Platelets 202; Potassium 3.7; Sodium 136  Recent Lipid Panel    Component Value Date/Time   CHOL 128 04/18/2018 0458   TRIG 67 04/18/2018 0458   HDL 67 04/18/2018 0458   CHOLHDL 1.9 04/18/2018 0458   VLDL 13 04/18/2018 0458   LDLCALC 48 04/18/2018 0458    History of Present Illness    89 year old female with the above past medical history including valvular heart disease, frequent PACs, bradycardia, hypertension, hyperlipidemia,  TIA,  hypothyroidism, memory impairment, restless leg syndrome, and CKD stage IIIb.  Nuclear stress test in 2012 nonischemic.  Echocardiogram at the time showed mild to moderate TR, trace MR, mild aortic sclerosis, normal systolic function.  She has a history of TIA in 2014.  MRI did not show acute intracranial abnormality but did show chronic microvascular ischemic changes.  Repeat echocardiogram in 2020 showed G2 DD, normal EF.  Echocardiogram in 05/2022 showed hyperdynamic LV function, EF 70 to 75%, G2 DD, severe LAE, moderate TR, moderate pulmonic valve regurgitation.  She has a history of bradycardia with frequent ectopy (PACs and PVCs).  Beta-blocker was ultimately discontinued in the setting of bradycardia.  She was last seen in the office on 10/25/2022 and was stable from a cardiac standpoint.  She was evaluated in the ED on 11/02/2022 in the setting of mechanical fall.  CT of the head was negative, chest x-ray was unremarkable.  Troponin was stable, EKG was nonischemic.  She returned to the ED on 11/16/2022 following yet another mechanical fall.  Troponin was mildly elevated, she reported intermittent dizziness and chest pain for several months.  Cardiology was consulted given elevated troponin, concern for possible syncope.  Cardiac telemetry monitoring revealed frequent PACs and PVCs, baseline bradycardia.  Outpatient cardiac monitoring was ordered, results pending.  Repeat echocardiogram showed EF 60 to 65%, normal LV function, no RWMA, G1 DD, normal RV systolic function, with mildly enlarged RV, mildly elevated PASP, mild tricuspid valve regurgitation, trivial mitral  valve regurgitation.  She returned to the ED on 12/04/2022 after she felt "woozy."  She reported palpitations, sensation of her heart racing.  She was noted to have evidence of tachycardia/bradycardia with HR ranging from the 30s to 120s.  Cardiology was consulted.  Follow-up was recommended pending monitor results.  However, it was felt that  she would likely not be a candidate for PPM given advanced age, frailty.  She presents today for follow-up accompanied by her caregiver.  Since her last visit and since her most recent ED visit she has been stable from a cardiac standpoint. She denies any new symptoms or concerns.  Denies any palpitations, dizziness, presyncope, or syncope.  She denies symptoms concerning for angina.  She notes her heart monitor was returned on Tuesday.  Results are still pending.  She notes she does not wish to pursue any aggressive medical treatment at this time, declines any invasive procedures.  Overall, she reports feeling well and denies any new concerns today.  Home Medications    Current Outpatient Medications  Medication Sig Dispense Refill   atorvastatin (LIPITOR) 10 MG tablet Take 1 tablet (10 mg total) by mouth daily. 30 tablet 1   calcium carbonate (OSCAL) 1500 (600 Ca) MG TABS tablet Take 600 mg of elemental calcium by mouth daily.     Cholecalciferol (VITAMIN D) 2000 UNITS CAPS Take 2,000 Units by mouth daily.     clopidogrel (PLAVIX) 75 MG tablet Take 75 mg by mouth daily.     donepezil (ARICEPT) 10 MG tablet Take 10 mg by mouth at bedtime.     hydrochlorothiazide (HYDRODIURIL) 12.5 MG tablet Take 1 tablet (12.5 mg total) by mouth daily. 30 tablet 1   levothyroxine (SYNTHROID) 25 MCG tablet Take 25-50 mcg by mouth See admin instructions. 25 mcg once daily before breakfast Monday-Saturday. 50 mcg on Sundays.     Multiple Vitamins-Minerals (ONE-A-DAY WOMENS 50+) TABS Take 1 tablet by mouth daily.     polyethylene glycol (MIRALAX / GLYCOLAX) 17 g packet Take 17 g by mouth daily as needed for mild constipation. 14 each 0   rOPINIRole (REQUIP) 1 MG tablet Take 2 mg by mouth See admin instructions. 2 mg every afternoon and 2 mg every evening     trimethoprim (TRIMPEX) 100 MG tablet Take 100 mg by mouth every morning.     No current facility-administered medications for this visit.     Review of  Systems    She denies chest pain, palpitations, dyspnea, pnd, orthopnea, n, v, dizziness, syncope, edema, weight gain, or early satiety. All other systems reviewed and are otherwise negative except as noted above.   Physical Exam    VS:  BP 120/62 (BP Location: Left Arm, Patient Position: Sitting, Cuff Size: Normal)   Pulse 74   Ht 4\' 11"  (1.499 m)   Wt 126 lb 9.6 oz (57.4 kg)   SpO2 96%   BMI 25.57 kg/m   GEN: Well nourished, well developed, in no acute distress. HEENT: normal. Neck: Supple, no JVD, carotid bruits, or masses. Cardiac: RRR, no murmurs, rubs, or gallops. No clubbing, cyanosis, edema.  Radials/DP/PT 2+ and equal bilaterally.  Respiratory:  Respirations regular and unlabored, clear to auscultation bilaterally. GI: Soft, nontender, nondistended, BS + x 4. MS: no deformity or atrophy. Skin: warm and dry, no rash. Neuro:  Strength and sensation are intact. Psych: Normal affect.  Accessory Clinical Findings    ECG personally reviewed by me today - EKG Interpretation Date/Time:  Thursday December 08 2022 15:10:57 EDT Ventricular Rate:  74 PR Interval:  142 QRS Duration:  86 QT Interval:  388 QTC Calculation: 430 R Axis:   -1  Text Interpretation: Sinus rhythm with Premature supraventricular complexes Possible Anterior infarct , age undetermined When compared with ECG of 04-Dec-2022 22:20, PREVIOUS ECG IS PRESENT Confirmed by Bernadene Person ([id number]) on 12/08/2022 3:28:53 PM Lab Results  Component Value Date   WBC 6.9 12/05/2022   HGB 11.5 (L) 12/05/2022   HCT 35.6 (L) 12/05/2022   MCV 92.7 12/05/2022   PLT 202 12/05/2022   Lab Results  Component Value Date   CREATININE 1.16 (H) 12/05/2022   BUN 21 12/05/2022   NA 136 12/05/2022   K 3.7 12/05/2022   CL 104 12/05/2022   CO2 24 12/05/2022   Lab Results  Component Value Date   ALT 21 12/05/2022   AST 24 12/05/2022   ALKPHOS 96 12/05/2022   BILITOT 0.5 12/05/2022   Lab Results  Component Value Date   CHOL  128 04/18/2018   HDL 67 04/18/2018   LDLCALC 48 04/18/2018   TRIG 67 04/18/2018   CHOLHDL 1.9 04/18/2018    Lab Results  Component Value Date   HGBA1C 5.6 04/18/2018    Assessment & Plan   1. Tachycardia/bradycardia syndrome/frequent PVCs and PACs: History of bradycardia with frequent ectopy, beta-blocker was previously discontinued.  Recent ED visit with concern for tachycardia-bradycardia syndrome.  Also with recent falls.  14-day ZIO monitor was completed, results still pending.  Recent echo stable as below.  She denies any further palpitations, presyncope or syncope.  She notes she does not wish to pursue any aggressive medical treatment at this time, declines invasive procedures such as pacemaker.  Reviewed ED precautions.  Would recommend she consider palliative care consult/goals of care discussion with her PCP in the near future.   2. Possible syncope/recurrent falls: Patient denies any presyncope or syncope, states that her falls were purely mechanical.  She did hit her head on 1 occasion, CT of the head was unremarkable.  She denies any recurrent falls.  Day ZIO monitor results pending as above, recent echo as below.  Reviewed ED precautions.  3. Valvular heart disease: Most recent echo in 11/2022 showed EF 60 to 65%, normal LV function, no RWMA, G1 DD, normal RV systolic function, with mildly enlarged RV, mildly elevated PASP, mild tricuspid valve regurgitation, trivial mitral valve regurgitation. Euvolemic and well compensated on exam.  As noted above, she is not interested in any aggressive treatment or invasive procedures at this time.   4. Hypertension: BP well controlled. Continue current antihypertensive regimen.   5. Hyperlipidemia: No recent LDL on file.  She is still taking Lipitor.  6. History of TIA: Will stop aspirin as was recommended during recent hospitalization given recent falls.  Continue Plavix.  7. CKD stage IIIb: Creatinine was 1.16 on 12/05/2022, stable.  8.  Disposition:  Follow-up in 3 months with APP, follow-up in 5 to 6 months with Dr. Tresa Endo, sooner if needed.      Joylene Grapes, NP 12/08/2022, 8:46 PM

## 2022-12-08 NOTE — Patient Instructions (Addendum)
Medication Instructions:  Stop Aspirin *If you need a refill on your cardiac medications before your next appointment, please call your pharmacy*   Lab Work: No labs If you have labs (blood work) drawn today and your tests are completely normal, you will receive your results only by: MyChart Message (if you have MyChart) OR A paper copy in the mail If you have any lab test that is abnormal or we need to change your treatment, we will call you to review the results.   Testing/Procedures: No Testing   Follow-Up: At [facility], you and your health needs are our priority.  As part of our continuing mission to provide you with exceptional heart care, we have created designated Provider Care Teams.  These Care Teams include your primary Cardiologist (physician) and Advanced Practice Providers (APPs -  Physician Assistants and Nurse Practitioners) who all work together to provide you with the care you need, when you need it.  We recommend signing up for the patient portal called "MyChart".  Sign up information is provided on this After Visit Summary.  MyChart is used to connect with patients for Virtual Visits (Telemedicine).  Patients are able to view lab/test results, encounter notes, upcoming appointments, etc.  Non-urgent messages can be sent to your provider as well.   To learn more about what you can do with MyChart, go to [email or web address].    Your next appointment:   3 month(s) Bernadene Person NP) 5-6 Months (Dr. Tresa Endo) Provider:   Bernadene Person, NP, Then, Nicki Guadalajara, MD will plan to see you again in 5-6 month(s).    Other Instructions Please contact primary care to discuss Palliative Care.

## 2022-12-12 DIAGNOSIS — I493 Ventricular premature depolarization: Secondary | ICD-10-CM | POA: Diagnosis not present

## 2022-12-20 ENCOUNTER — Telehealth: Payer: Self-pay | Admitting: Cardiovascular Disease

## 2022-12-20 NOTE — Telephone Encounter (Signed)
Pt's daughter Darl Pikes is requesting a callback regarding results from monitor. She stated they still hadn't heard anything. Please advise

## 2022-12-20 NOTE — Telephone Encounter (Signed)
Spoke with daughter susan. Confirmed results are back for zio and I will forward to Dr. Tresa Endo for review. Will follow up with daughter to report Dr. Tresa Endo comments as pt does not use mychart.

## 2022-12-22 ENCOUNTER — Encounter: Payer: Self-pay | Admitting: Podiatry

## 2022-12-22 ENCOUNTER — Ambulatory Visit: Payer: Medicare Other | Admitting: Podiatry

## 2022-12-22 DIAGNOSIS — M79675 Pain in left toe(s): Secondary | ICD-10-CM

## 2022-12-22 DIAGNOSIS — D689 Coagulation defect, unspecified: Secondary | ICD-10-CM | POA: Diagnosis not present

## 2022-12-22 DIAGNOSIS — B351 Tinea unguium: Secondary | ICD-10-CM

## 2022-12-22 DIAGNOSIS — M79674 Pain in right toe(s): Secondary | ICD-10-CM

## 2022-12-22 NOTE — Progress Notes (Signed)
This patient returns to my office for at risk foot care.  This patient requires this care by a professional since this patient will be at risk due to having coagulation defect due to plavix. This patient is unable to cut nails herself since the patient cannot reach hernails.These nails are painful walking and wearing shoes.  This patient presents for at risk foot care today.  She presents to the [facility] with her son and is in a wheelchair.  General Appearance  Alert, conversant and in no acute stress.  Vascular  Dorsalis pedis and posterior tibial  pulses are palpable  bilaterally.  Capillary return is within normal limits  bilaterally. Temperature is within normal limits  bilaterally.  Neurologic  Senn-Weinstein monofilament wire test within normal limits  bilaterally. Muscle power within normal limits bilaterally.  Nails Thick disfigured discolored nails with subungual debris  from hallux to fifth toes bilaterally. No evidence of bacterial infection or drainage bilaterally.  Orthopedic  No limitations of motion  feet .  No crepitus or effusions noted.  No bony pathology or digital deformities noted.  Skin  normotropic skin with no porokeratosis noted bilaterally.  No signs of infections or ulcers noted.     Onychomycosis  Pain in right toes  Pain in left toes  Consent was obtained for treatment procedures.   Mechanical debridement of nails 1-5  bilaterally performed with a nail nipper.  Filed with dremel without incident.    Return office visit   3 months                   Told patient to return for periodic foot care and evaluation due to potential at risk complications.   Helane Gunther DPM

## 2022-12-23 DIAGNOSIS — M62512 Muscle wasting and atrophy, not elsewhere classified, left shoulder: Secondary | ICD-10-CM | POA: Diagnosis not present

## 2022-12-23 DIAGNOSIS — R2681 Unsteadiness on feet: Secondary | ICD-10-CM | POA: Diagnosis not present

## 2022-12-23 DIAGNOSIS — M62511 Muscle wasting and atrophy, not elsewhere classified, right shoulder: Secondary | ICD-10-CM | POA: Diagnosis not present

## 2022-12-23 DIAGNOSIS — M62542 Muscle wasting and atrophy, not elsewhere classified, left hand: Secondary | ICD-10-CM | POA: Diagnosis not present

## 2022-12-23 DIAGNOSIS — M62541 Muscle wasting and atrophy, not elsewhere classified, right hand: Secondary | ICD-10-CM | POA: Diagnosis not present

## 2022-12-23 DIAGNOSIS — R296 Repeated falls: Secondary | ICD-10-CM | POA: Diagnosis not present

## 2022-12-28 DIAGNOSIS — R2689 Other abnormalities of gait and mobility: Secondary | ICD-10-CM | POA: Diagnosis not present

## 2022-12-28 DIAGNOSIS — R2681 Unsteadiness on feet: Secondary | ICD-10-CM | POA: Diagnosis not present

## 2022-12-28 DIAGNOSIS — R296 Repeated falls: Secondary | ICD-10-CM | POA: Diagnosis not present

## 2022-12-28 NOTE — Telephone Encounter (Signed)
Called pt's daughter, Valerie Barker. She states she is none a [place]. She was [facility] previously. Requested they update the form for office use.  Went over her mother's monitor report with her, answered questions. No further questions at this time. They would like a report of this at the next visit.

## 2022-12-28 NOTE — Telephone Encounter (Signed)
Spoke with the son of patient regarding results

## 2022-12-28 NOTE — Telephone Encounter (Signed)
Daughter is calling back looking for an update. Please advise

## 2022-12-29 DIAGNOSIS — R296 Repeated falls: Secondary | ICD-10-CM | POA: Diagnosis not present

## 2022-12-29 DIAGNOSIS — M62541 Muscle wasting and atrophy, not elsewhere classified, right hand: Secondary | ICD-10-CM | POA: Diagnosis not present

## 2022-12-29 DIAGNOSIS — M62511 Muscle wasting and atrophy, not elsewhere classified, right shoulder: Secondary | ICD-10-CM | POA: Diagnosis not present

## 2022-12-29 DIAGNOSIS — M62542 Muscle wasting and atrophy, not elsewhere classified, left hand: Secondary | ICD-10-CM | POA: Diagnosis not present

## 2022-12-29 DIAGNOSIS — M62512 Muscle wasting and atrophy, not elsewhere classified, left shoulder: Secondary | ICD-10-CM | POA: Diagnosis not present

## 2022-12-29 DIAGNOSIS — N3020 Other chronic cystitis without hematuria: Secondary | ICD-10-CM | POA: Diagnosis not present

## 2022-12-29 DIAGNOSIS — R2681 Unsteadiness on feet: Secondary | ICD-10-CM | POA: Diagnosis not present

## 2022-12-30 DIAGNOSIS — R2689 Other abnormalities of gait and mobility: Secondary | ICD-10-CM | POA: Diagnosis not present

## 2022-12-30 DIAGNOSIS — R2681 Unsteadiness on feet: Secondary | ICD-10-CM | POA: Diagnosis not present

## 2022-12-30 DIAGNOSIS — R296 Repeated falls: Secondary | ICD-10-CM | POA: Diagnosis not present

## 2023-01-02 DIAGNOSIS — R2681 Unsteadiness on feet: Secondary | ICD-10-CM | POA: Diagnosis not present

## 2023-01-02 DIAGNOSIS — R296 Repeated falls: Secondary | ICD-10-CM | POA: Diagnosis not present

## 2023-01-02 DIAGNOSIS — R2689 Other abnormalities of gait and mobility: Secondary | ICD-10-CM | POA: Diagnosis not present

## 2023-01-03 DIAGNOSIS — R2681 Unsteadiness on feet: Secondary | ICD-10-CM | POA: Diagnosis not present

## 2023-01-03 DIAGNOSIS — M62541 Muscle wasting and atrophy, not elsewhere classified, right hand: Secondary | ICD-10-CM | POA: Diagnosis not present

## 2023-01-03 DIAGNOSIS — M62511 Muscle wasting and atrophy, not elsewhere classified, right shoulder: Secondary | ICD-10-CM | POA: Diagnosis not present

## 2023-01-03 DIAGNOSIS — R296 Repeated falls: Secondary | ICD-10-CM | POA: Diagnosis not present

## 2023-01-03 DIAGNOSIS — M62512 Muscle wasting and atrophy, not elsewhere classified, left shoulder: Secondary | ICD-10-CM | POA: Diagnosis not present

## 2023-01-03 DIAGNOSIS — M62542 Muscle wasting and atrophy, not elsewhere classified, left hand: Secondary | ICD-10-CM | POA: Diagnosis not present

## 2023-01-04 DIAGNOSIS — R2681 Unsteadiness on feet: Secondary | ICD-10-CM | POA: Diagnosis not present

## 2023-01-04 DIAGNOSIS — R296 Repeated falls: Secondary | ICD-10-CM | POA: Diagnosis not present

## 2023-01-04 DIAGNOSIS — R2689 Other abnormalities of gait and mobility: Secondary | ICD-10-CM | POA: Diagnosis not present

## 2023-01-06 DIAGNOSIS — M62541 Muscle wasting and atrophy, not elsewhere classified, right hand: Secondary | ICD-10-CM | POA: Diagnosis not present

## 2023-01-06 DIAGNOSIS — M62511 Muscle wasting and atrophy, not elsewhere classified, right shoulder: Secondary | ICD-10-CM | POA: Diagnosis not present

## 2023-01-06 DIAGNOSIS — R2681 Unsteadiness on feet: Secondary | ICD-10-CM | POA: Diagnosis not present

## 2023-01-06 DIAGNOSIS — M62512 Muscle wasting and atrophy, not elsewhere classified, left shoulder: Secondary | ICD-10-CM | POA: Diagnosis not present

## 2023-01-06 DIAGNOSIS — M62542 Muscle wasting and atrophy, not elsewhere classified, left hand: Secondary | ICD-10-CM | POA: Diagnosis not present

## 2023-01-10 DIAGNOSIS — M62541 Muscle wasting and atrophy, not elsewhere classified, right hand: Secondary | ICD-10-CM | POA: Diagnosis not present

## 2023-01-10 DIAGNOSIS — R2681 Unsteadiness on feet: Secondary | ICD-10-CM | POA: Diagnosis not present

## 2023-01-10 DIAGNOSIS — M62542 Muscle wasting and atrophy, not elsewhere classified, left hand: Secondary | ICD-10-CM | POA: Diagnosis not present

## 2023-01-10 DIAGNOSIS — M62511 Muscle wasting and atrophy, not elsewhere classified, right shoulder: Secondary | ICD-10-CM | POA: Diagnosis not present

## 2023-01-10 DIAGNOSIS — M62512 Muscle wasting and atrophy, not elsewhere classified, left shoulder: Secondary | ICD-10-CM | POA: Diagnosis not present

## 2023-01-11 DIAGNOSIS — R296 Repeated falls: Secondary | ICD-10-CM | POA: Diagnosis not present

## 2023-01-11 DIAGNOSIS — M62512 Muscle wasting and atrophy, not elsewhere classified, left shoulder: Secondary | ICD-10-CM | POA: Diagnosis not present

## 2023-01-11 DIAGNOSIS — M62511 Muscle wasting and atrophy, not elsewhere classified, right shoulder: Secondary | ICD-10-CM | POA: Diagnosis not present

## 2023-01-11 DIAGNOSIS — M62541 Muscle wasting and atrophy, not elsewhere classified, right hand: Secondary | ICD-10-CM | POA: Diagnosis not present

## 2023-01-11 DIAGNOSIS — R2681 Unsteadiness on feet: Secondary | ICD-10-CM | POA: Diagnosis not present

## 2023-01-11 DIAGNOSIS — M62542 Muscle wasting and atrophy, not elsewhere classified, left hand: Secondary | ICD-10-CM | POA: Diagnosis not present

## 2023-01-11 DIAGNOSIS — R2689 Other abnormalities of gait and mobility: Secondary | ICD-10-CM | POA: Diagnosis not present

## 2023-01-12 ENCOUNTER — Telehealth: Payer: Self-pay | Admitting: Internal Medicine

## 2023-01-16 DIAGNOSIS — Z515 Encounter for palliative care: Secondary | ICD-10-CM | POA: Diagnosis not present

## 2023-01-18 DIAGNOSIS — R2681 Unsteadiness on feet: Secondary | ICD-10-CM | POA: Diagnosis not present

## 2023-01-18 DIAGNOSIS — R2689 Other abnormalities of gait and mobility: Secondary | ICD-10-CM | POA: Diagnosis not present

## 2023-01-18 DIAGNOSIS — R296 Repeated falls: Secondary | ICD-10-CM | POA: Diagnosis not present

## 2023-01-23 ENCOUNTER — Telehealth: Payer: Self-pay | Admitting: Cardiovascular Disease

## 2023-01-23 NOTE — Telephone Encounter (Signed)
*  STAT* If patient is at the pharmacy, call can be transferred to refill team.   1. Which medications need to be refilled? (please list name of each medication and dose if known)  atorvastatin (LIPITOR) 10 MG tablet   hydrochlorothiazide (HYDRODIURIL) 12.5 MG tablet   2. Which pharmacy/location (including street and city if local pharmacy) is medication to be sent to?  [facility] [place], [place] - [address]    3. Do they need a 30 day or 90 day supply? 90

## 2023-01-24 ENCOUNTER — Other Ambulatory Visit: Payer: Self-pay

## 2023-01-24 MED ORDER — ATORVASTATIN CALCIUM 10 MG PO TABS: 10.0000 mg | ORAL_TABLET | Freq: Every day | ORAL | 1 refills | Status: DC

## 2023-01-24 MED ORDER — HYDROCHLOROTHIAZIDE 12.5 MG PO TABS: 12.5000 mg | ORAL_TABLET | Freq: Every day | ORAL | 3 refills | Status: DC

## 2023-01-24 MED ORDER — HYDROCHLOROTHIAZIDE 12.5 MG PO TABS: 12.5000 mg | ORAL_TABLET | Freq: Every day | ORAL | 1 refills | Status: DC

## 2023-01-24 NOTE — Telephone Encounter (Signed)
Refills has been sent to the pharmacy. 

## 2023-01-27 DIAGNOSIS — Z515 Encounter for palliative care: Secondary | ICD-10-CM | POA: Diagnosis not present

## 2023-01-30 DIAGNOSIS — M62511 Muscle wasting and atrophy, not elsewhere classified, right shoulder: Secondary | ICD-10-CM | POA: Diagnosis not present

## 2023-01-30 DIAGNOSIS — M62542 Muscle wasting and atrophy, not elsewhere classified, left hand: Secondary | ICD-10-CM | POA: Diagnosis not present

## 2023-01-30 DIAGNOSIS — M62512 Muscle wasting and atrophy, not elsewhere classified, left shoulder: Secondary | ICD-10-CM | POA: Diagnosis not present

## 2023-01-30 DIAGNOSIS — R2681 Unsteadiness on feet: Secondary | ICD-10-CM | POA: Diagnosis not present

## 2023-01-30 DIAGNOSIS — M62541 Muscle wasting and atrophy, not elsewhere classified, right hand: Secondary | ICD-10-CM | POA: Diagnosis not present

## 2023-02-07 DIAGNOSIS — R2689 Other abnormalities of gait and mobility: Secondary | ICD-10-CM | POA: Diagnosis not present

## 2023-02-07 DIAGNOSIS — R2681 Unsteadiness on feet: Secondary | ICD-10-CM | POA: Diagnosis not present

## 2023-02-07 DIAGNOSIS — R296 Repeated falls: Secondary | ICD-10-CM | POA: Diagnosis not present

## 2023-02-08 DIAGNOSIS — R2689 Other abnormalities of gait and mobility: Secondary | ICD-10-CM | POA: Diagnosis not present

## 2023-02-08 DIAGNOSIS — R2681 Unsteadiness on feet: Secondary | ICD-10-CM | POA: Diagnosis not present

## 2023-02-08 DIAGNOSIS — R296 Repeated falls: Secondary | ICD-10-CM | POA: Diagnosis not present

## 2023-02-09 DIAGNOSIS — M62511 Muscle wasting and atrophy, not elsewhere classified, right shoulder: Secondary | ICD-10-CM | POA: Diagnosis not present

## 2023-02-09 DIAGNOSIS — M62542 Muscle wasting and atrophy, not elsewhere classified, left hand: Secondary | ICD-10-CM | POA: Diagnosis not present

## 2023-02-09 DIAGNOSIS — R2689 Other abnormalities of gait and mobility: Secondary | ICD-10-CM | POA: Diagnosis not present

## 2023-02-09 DIAGNOSIS — M62512 Muscle wasting and atrophy, not elsewhere classified, left shoulder: Secondary | ICD-10-CM | POA: Diagnosis not present

## 2023-02-09 DIAGNOSIS — R2681 Unsteadiness on feet: Secondary | ICD-10-CM | POA: Diagnosis not present

## 2023-02-09 DIAGNOSIS — M62541 Muscle wasting and atrophy, not elsewhere classified, right hand: Secondary | ICD-10-CM | POA: Diagnosis not present

## 2023-02-09 DIAGNOSIS — R296 Repeated falls: Secondary | ICD-10-CM | POA: Diagnosis not present

## 2023-02-14 DIAGNOSIS — M62512 Muscle wasting and atrophy, not elsewhere classified, left shoulder: Secondary | ICD-10-CM | POA: Diagnosis not present

## 2023-02-14 DIAGNOSIS — R2681 Unsteadiness on feet: Secondary | ICD-10-CM | POA: Diagnosis not present

## 2023-02-14 DIAGNOSIS — M62541 Muscle wasting and atrophy, not elsewhere classified, right hand: Secondary | ICD-10-CM | POA: Diagnosis not present

## 2023-02-14 DIAGNOSIS — M62542 Muscle wasting and atrophy, not elsewhere classified, left hand: Secondary | ICD-10-CM | POA: Diagnosis not present

## 2023-02-14 DIAGNOSIS — M62511 Muscle wasting and atrophy, not elsewhere classified, right shoulder: Secondary | ICD-10-CM | POA: Diagnosis not present

## 2023-02-15 DIAGNOSIS — M62541 Muscle wasting and atrophy, not elsewhere classified, right hand: Secondary | ICD-10-CM | POA: Diagnosis not present

## 2023-02-15 DIAGNOSIS — M62512 Muscle wasting and atrophy, not elsewhere classified, left shoulder: Secondary | ICD-10-CM | POA: Diagnosis not present

## 2023-02-15 DIAGNOSIS — R296 Repeated falls: Secondary | ICD-10-CM | POA: Diagnosis not present

## 2023-02-15 DIAGNOSIS — R2681 Unsteadiness on feet: Secondary | ICD-10-CM | POA: Diagnosis not present

## 2023-02-15 DIAGNOSIS — M62511 Muscle wasting and atrophy, not elsewhere classified, right shoulder: Secondary | ICD-10-CM | POA: Diagnosis not present

## 2023-02-15 DIAGNOSIS — R2689 Other abnormalities of gait and mobility: Secondary | ICD-10-CM | POA: Diagnosis not present

## 2023-02-15 DIAGNOSIS — M62542 Muscle wasting and atrophy, not elsewhere classified, left hand: Secondary | ICD-10-CM | POA: Diagnosis not present

## 2023-02-16 DIAGNOSIS — Z515 Encounter for palliative care: Secondary | ICD-10-CM | POA: Diagnosis not present

## 2023-02-17 DIAGNOSIS — R296 Repeated falls: Secondary | ICD-10-CM | POA: Diagnosis not present

## 2023-02-17 DIAGNOSIS — R2681 Unsteadiness on feet: Secondary | ICD-10-CM | POA: Diagnosis not present

## 2023-02-17 DIAGNOSIS — R2689 Other abnormalities of gait and mobility: Secondary | ICD-10-CM | POA: Diagnosis not present

## 2023-02-22 DIAGNOSIS — R2681 Unsteadiness on feet: Secondary | ICD-10-CM | POA: Diagnosis not present

## 2023-02-22 DIAGNOSIS — R2689 Other abnormalities of gait and mobility: Secondary | ICD-10-CM | POA: Diagnosis not present

## 2023-02-22 DIAGNOSIS — R296 Repeated falls: Secondary | ICD-10-CM | POA: Diagnosis not present

## 2023-02-24 DIAGNOSIS — M62541 Muscle wasting and atrophy, not elsewhere classified, right hand: Secondary | ICD-10-CM | POA: Diagnosis not present

## 2023-02-24 DIAGNOSIS — R2689 Other abnormalities of gait and mobility: Secondary | ICD-10-CM | POA: Diagnosis not present

## 2023-02-24 DIAGNOSIS — M62542 Muscle wasting and atrophy, not elsewhere classified, left hand: Secondary | ICD-10-CM | POA: Diagnosis not present

## 2023-02-24 DIAGNOSIS — R2681 Unsteadiness on feet: Secondary | ICD-10-CM | POA: Diagnosis not present

## 2023-02-24 DIAGNOSIS — M62512 Muscle wasting and atrophy, not elsewhere classified, left shoulder: Secondary | ICD-10-CM | POA: Diagnosis not present

## 2023-02-24 DIAGNOSIS — R296 Repeated falls: Secondary | ICD-10-CM | POA: Diagnosis not present

## 2023-02-24 DIAGNOSIS — M62511 Muscle wasting and atrophy, not elsewhere classified, right shoulder: Secondary | ICD-10-CM | POA: Diagnosis not present

## 2023-02-27 DIAGNOSIS — M62541 Muscle wasting and atrophy, not elsewhere classified, right hand: Secondary | ICD-10-CM | POA: Diagnosis not present

## 2023-02-27 DIAGNOSIS — M62542 Muscle wasting and atrophy, not elsewhere classified, left hand: Secondary | ICD-10-CM | POA: Diagnosis not present

## 2023-02-27 DIAGNOSIS — R2681 Unsteadiness on feet: Secondary | ICD-10-CM | POA: Diagnosis not present

## 2023-02-27 DIAGNOSIS — M62511 Muscle wasting and atrophy, not elsewhere classified, right shoulder: Secondary | ICD-10-CM | POA: Diagnosis not present

## 2023-02-27 DIAGNOSIS — M62512 Muscle wasting and atrophy, not elsewhere classified, left shoulder: Secondary | ICD-10-CM | POA: Diagnosis not present

## 2023-02-28 DIAGNOSIS — R296 Repeated falls: Secondary | ICD-10-CM | POA: Diagnosis not present

## 2023-02-28 DIAGNOSIS — R2689 Other abnormalities of gait and mobility: Secondary | ICD-10-CM | POA: Diagnosis not present

## 2023-02-28 DIAGNOSIS — R2681 Unsteadiness on feet: Secondary | ICD-10-CM | POA: Diagnosis not present

## 2023-03-07 DIAGNOSIS — R2681 Unsteadiness on feet: Secondary | ICD-10-CM | POA: Diagnosis not present

## 2023-03-07 DIAGNOSIS — R2689 Other abnormalities of gait and mobility: Secondary | ICD-10-CM | POA: Diagnosis not present

## 2023-03-07 DIAGNOSIS — R296 Repeated falls: Secondary | ICD-10-CM | POA: Diagnosis not present

## 2023-03-08 DIAGNOSIS — R2689 Other abnormalities of gait and mobility: Secondary | ICD-10-CM | POA: Diagnosis not present

## 2023-03-08 DIAGNOSIS — M62542 Muscle wasting and atrophy, not elsewhere classified, left hand: Secondary | ICD-10-CM | POA: Diagnosis not present

## 2023-03-08 DIAGNOSIS — R296 Repeated falls: Secondary | ICD-10-CM | POA: Diagnosis not present

## 2023-03-08 DIAGNOSIS — R2681 Unsteadiness on feet: Secondary | ICD-10-CM | POA: Diagnosis not present

## 2023-03-08 DIAGNOSIS — M62512 Muscle wasting and atrophy, not elsewhere classified, left shoulder: Secondary | ICD-10-CM | POA: Diagnosis not present

## 2023-03-08 DIAGNOSIS — M62511 Muscle wasting and atrophy, not elsewhere classified, right shoulder: Secondary | ICD-10-CM | POA: Diagnosis not present

## 2023-03-08 DIAGNOSIS — M62541 Muscle wasting and atrophy, not elsewhere classified, right hand: Secondary | ICD-10-CM | POA: Diagnosis not present

## 2023-03-09 ENCOUNTER — Ambulatory Visit: Payer: Medicare Other | Admitting: Nurse Practitioner

## 2023-03-15 DIAGNOSIS — M62512 Muscle wasting and atrophy, not elsewhere classified, left shoulder: Secondary | ICD-10-CM | POA: Diagnosis not present

## 2023-03-15 DIAGNOSIS — M62542 Muscle wasting and atrophy, not elsewhere classified, left hand: Secondary | ICD-10-CM | POA: Diagnosis not present

## 2023-03-15 DIAGNOSIS — M62511 Muscle wasting and atrophy, not elsewhere classified, right shoulder: Secondary | ICD-10-CM | POA: Diagnosis not present

## 2023-03-15 DIAGNOSIS — R2681 Unsteadiness on feet: Secondary | ICD-10-CM | POA: Diagnosis not present

## 2023-03-15 DIAGNOSIS — M62541 Muscle wasting and atrophy, not elsewhere classified, right hand: Secondary | ICD-10-CM | POA: Diagnosis not present

## 2023-03-16 ENCOUNTER — Encounter: Payer: Self-pay | Admitting: Podiatry

## 2023-03-16 ENCOUNTER — Ambulatory Visit: Payer: Medicare Other | Admitting: Podiatry

## 2023-03-16 DIAGNOSIS — D689 Coagulation defect, unspecified: Secondary | ICD-10-CM

## 2023-03-16 DIAGNOSIS — M79675 Pain in left toe(s): Secondary | ICD-10-CM

## 2023-03-16 DIAGNOSIS — B351 Tinea unguium: Secondary | ICD-10-CM | POA: Diagnosis not present

## 2023-03-16 DIAGNOSIS — M79674 Pain in right toe(s): Secondary | ICD-10-CM | POA: Diagnosis not present

## 2023-03-16 NOTE — Progress Notes (Signed)
This patient returns to my office for at risk foot care.  This patient requires this care by a professional since this patient will be at risk due to having coagulation defect due to plavix. This patient is unable to cut nails herself since the patient cannot reach hernails.These nails are painful walking and wearing shoes.  This patient presents for at risk foot care today.  She presents to the [facility] with her son and is in a wheelchair.  General Appearance  Alert, conversant and in no acute stress.  Vascular  Dorsalis pedis and posterior tibial  pulses are palpable  bilaterally.  Capillary return is within normal limits  bilaterally. Temperature is within normal limits  bilaterally.  Neurologic  Senn-Weinstein monofilament wire test within normal limits  bilaterally. Muscle power within normal limits bilaterally.  Nails Thick disfigured discolored nails with subungual debris  from hallux to fifth toes bilaterally. No evidence of bacterial infection or drainage bilaterally.  Orthopedic  No limitations of motion  feet .  No crepitus or effusions noted.  No bony pathology or digital deformities noted.  Skin  normotropic skin with no porokeratosis noted bilaterally.  No signs of infections or ulcers noted.     Onychomycosis  Pain in right toes  Pain in left toes  Consent was obtained for treatment procedures.   Mechanical debridement of nails 1-5  bilaterally performed with a nail nipper.  Filed with dremel without incident.    Return office visit   3 months                   Told patient to return for periodic foot care and evaluation due to potential at risk complications.   Helane Gunther DPM

## 2023-03-17 DIAGNOSIS — R2689 Other abnormalities of gait and mobility: Secondary | ICD-10-CM | POA: Diagnosis not present

## 2023-03-17 DIAGNOSIS — R2681 Unsteadiness on feet: Secondary | ICD-10-CM | POA: Diagnosis not present

## 2023-03-17 DIAGNOSIS — R296 Repeated falls: Secondary | ICD-10-CM | POA: Diagnosis not present

## 2023-03-20 ENCOUNTER — Emergency Department (HOSPITAL_COMMUNITY): Payer: Medicare Other

## 2023-03-20 ENCOUNTER — Emergency Department (HOSPITAL_COMMUNITY)
Admission: EM | Admit: 2023-03-20 | Discharge: 2023-03-20 | Disposition: A | Discharge status: Home / Self Care | Payer: Medicare Other | Attending: Emergency Medicine | Admitting: Emergency Medicine

## 2023-03-20 DIAGNOSIS — S40012A Contusion of left shoulder, initial encounter: Secondary | ICD-10-CM | POA: Diagnosis not present

## 2023-03-20 DIAGNOSIS — Z7902 Long term (current) use of antithrombotics/antiplatelets: Secondary | ICD-10-CM | POA: Diagnosis not present

## 2023-03-20 DIAGNOSIS — T1490XA Injury, unspecified, initial encounter: Secondary | ICD-10-CM | POA: Diagnosis not present

## 2023-03-20 DIAGNOSIS — W01198A Fall on same level from slipping, tripping and stumbling with subsequent striking against other object, initial encounter: Secondary | ICD-10-CM | POA: Insufficient documentation

## 2023-03-20 DIAGNOSIS — I10 Essential (primary) hypertension: Secondary | ICD-10-CM | POA: Insufficient documentation

## 2023-03-20 DIAGNOSIS — R29890 Loss of height: Secondary | ICD-10-CM | POA: Diagnosis not present

## 2023-03-20 DIAGNOSIS — M5030 Other cervical disc degeneration, unspecified cervical region: Secondary | ICD-10-CM | POA: Diagnosis not present

## 2023-03-20 DIAGNOSIS — R0989 Other specified symptoms and signs involving the circulatory and respiratory systems: Secondary | ICD-10-CM | POA: Diagnosis not present

## 2023-03-20 DIAGNOSIS — S060X0A Concussion without loss of consciousness, initial encounter: Secondary | ICD-10-CM | POA: Diagnosis not present

## 2023-03-20 DIAGNOSIS — W19XXXA Unspecified fall, initial encounter: Secondary | ICD-10-CM | POA: Diagnosis not present

## 2023-03-20 DIAGNOSIS — H05232 Hemorrhage of left orbit: Secondary | ICD-10-CM

## 2023-03-20 DIAGNOSIS — I700 Atherosclerosis of aorta: Secondary | ICD-10-CM | POA: Diagnosis not present

## 2023-03-20 DIAGNOSIS — S0012XA Contusion of left eyelid and periocular area, initial encounter: Secondary | ICD-10-CM | POA: Diagnosis not present

## 2023-03-20 DIAGNOSIS — S22010A Wedge compression fracture of first thoracic vertebra, initial encounter for closed fracture: Secondary | ICD-10-CM

## 2023-03-20 DIAGNOSIS — R220 Localized swelling, mass and lump, head: Secondary | ICD-10-CM | POA: Diagnosis not present

## 2023-03-20 DIAGNOSIS — Z743 Need for continuous supervision: Secondary | ICD-10-CM | POA: Diagnosis not present

## 2023-03-20 DIAGNOSIS — S22019A Unspecified fracture of first thoracic vertebra, initial encounter for closed fracture: Secondary | ICD-10-CM | POA: Diagnosis not present

## 2023-03-20 DIAGNOSIS — Z043 Encounter for examination and observation following other accident: Secondary | ICD-10-CM | POA: Diagnosis not present

## 2023-03-20 DIAGNOSIS — S161XXA Strain of muscle, fascia and tendon at neck level, initial encounter: Secondary | ICD-10-CM | POA: Diagnosis not present

## 2023-03-20 DIAGNOSIS — Z79899 Other long term (current) drug therapy: Secondary | ICD-10-CM | POA: Insufficient documentation

## 2023-03-20 DIAGNOSIS — S43402A Unspecified sprain of left shoulder joint, initial encounter: Secondary | ICD-10-CM

## 2023-03-20 DIAGNOSIS — I517 Cardiomegaly: Secondary | ICD-10-CM | POA: Diagnosis not present

## 2023-03-20 DIAGNOSIS — M25512 Pain in left shoulder: Secondary | ICD-10-CM | POA: Diagnosis not present

## 2023-03-20 DIAGNOSIS — S0510XA Contusion of eyeball and orbital tissues, unspecified eye, initial encounter: Secondary | ICD-10-CM | POA: Diagnosis not present

## 2023-03-20 DIAGNOSIS — R6889 Other general symptoms and signs: Secondary | ICD-10-CM | POA: Diagnosis not present

## 2023-03-20 DIAGNOSIS — M19012 Primary osteoarthritis, left shoulder: Secondary | ICD-10-CM | POA: Diagnosis not present

## 2023-03-20 DIAGNOSIS — S0990XA Unspecified injury of head, initial encounter: Secondary | ICD-10-CM | POA: Diagnosis present

## 2023-03-20 DIAGNOSIS — M4854XA Collapsed vertebra, not elsewhere classified, thoracic region, initial encounter for fracture: Secondary | ICD-10-CM | POA: Diagnosis not present

## 2023-03-20 MED ORDER — ACETAMINOPHEN 500 MG PO TABS
1000.0000 mg | ORAL_TABLET | Freq: Once | ORAL | Status: AC
  Administered 2023-03-20: 1000 mg via ORAL
  Filled 2023-03-20: qty 2

## 2023-03-20 NOTE — ED Notes (Signed)
Delay in imaging - pt using bedpan.

## 2023-03-20 NOTE — ED Notes (Signed)
Trauma labs and urine sample sent to lab to hold

## 2023-03-20 NOTE — ED Notes (Signed)
Trauma Response Nurse Documentation   Valerie Barker is a 89 y.o. female arriving to [facility] ED via EMS  On clopidogrel 75 mg daily. Trauma was activated as a Level 2 by ED charge RN based on the following trauma criteria Elderly patients > 65 with head trauma on anti-coagulation (excluding ASA).  Patient cleared for CT by Dr. Bebe Shaggy EDP. Pt transported to CT with trauma response nurse present to monitor. RN remained with the patient throughout their absence from the department for clinical observation.   GCS 15.    History   Past Medical History:  Diagnosis Date   Acute urinary retention 04/17/2018   Heart disease    Hypertension    IBS (irritable bowel syndrome)    Stroke (HCC) 04/17/2018   around 2011   TIA (transient ischemic attack)      Past Surgical History:  Procedure Laterality Date   ABDOMINAL HYSTERECTOMY  1978   BUNIONECTOMY  2006, 1996   LAPAROSCOPIC SALPINGO OOPHERECTOMY  1988       Initial Focused Assessment (If applicable, or please see trauma documentation): Alert/oriented female presents via EMS from SNF after a fall from bed, struck in the left eye with rollator type walker. Hematoma to left eye obstructing vision. C/o left shoulder pain. No other deformities noted.  Airway patent, BS clear No obvious uncontrolled hemorrhage GCS 15  CT's Completed:   CT Head, CT Maxillofacial, and CT C-Spine   Interventions:  IV start and trauma lab draw Portable chest and left shoulder XRAY C-collar applied on arrival (no collar with EMS) CT head, cervical spine, maxillofacial Ice to hematoma  Plan for disposition:  Discharge home   Consults completed:  non  Event Summary: Presents via EMS from SNF from a fall, stuck in the left eye with walker. [place] J collar applied on arrival. Hematoma to left eye. Scans unremarkable. D/C  MTP Summary (If applicable): NA  Bedside handoff with ED RN Armond Hang.    Jaciel Diem O Maxamus Colao  Trauma Response RN  Please  call TRN at [phone] for further assistance.

## 2023-03-20 NOTE — ED Provider Notes (Signed)
[place] EMERGENCY DEPARTMENT AT [facility] Provider Note   CSN: [id number] Arrival date & time: 03/20/23  4401     History  Chief Complaint  Patient presents with   FOT    Valerie Barker is a 89 y.o. female.  The history is provided by the patient.  Patient with history of hypertension, previous CVA on Plavix presents after an accidental fall Patient woke up to go to the toilet when she slipped and fell in her rollator fell and hit her head.  She reports swelling around her eye.  No LOC.  She is also having left shoulder pain. No other acute traumatic injuries.  No chest or abdominal pain.  No new back pain    Past Medical History:  Diagnosis Date   Acute urinary retention 04/17/2018   Heart disease    Hypertension    IBS (irritable bowel syndrome)    Stroke (HCC) 04/17/2018   around 2011   TIA (transient ischemic attack)     Home Medications Prior to Admission medications   Medication Sig Start Date End Date Taking? Authorizing Provider  atorvastatin (LIPITOR) 10 MG tablet Take 1 tablet (10 mg total) by mouth daily. 01/24/23   Lennette Bihari, MD  calcium carbonate (OSCAL) 1500 (600 Ca) MG TABS tablet Take 600 mg of elemental calcium by mouth daily.    [provider]  Cholecalciferol (VITAMIN D) 2000 UNITS CAPS Take 2,000 Units by mouth daily.    [provider]  clopidogrel (PLAVIX) 75 MG tablet Take 75 mg by mouth daily.    [provider]  donepezil (ARICEPT) 10 MG tablet Take 10 mg by mouth at bedtime. 10/05/22   [provider]  hydrochlorothiazide (HYDRODIURIL) 12.5 MG tablet Take 1 tablet (12.5 mg total) by mouth daily. 01/24/23   Joylene Grapes, NP  levothyroxine (SYNTHROID) 25 MCG tablet Take 25-50 mcg by mouth See admin instructions. 25 mcg once daily before breakfast Monday-Saturday. 50 mcg on Sundays.    [provider]  Multiple Vitamins-Minerals (ONE-A-DAY WOMENS 50+) TABS Take 1 tablet by  mouth daily.    [provider]  polyethylene glycol (MIRALAX / GLYCOLAX) 17 g packet Take 17 g by mouth daily as needed for mild constipation. 11/18/22   Barnetta Chapel, MD  rOPINIRole (REQUIP) 1 MG tablet Take 2 mg by mouth See admin instructions. 2 mg every afternoon and 2 mg every evening 05/26/18   [provider]  trimethoprim (TRIMPEX) 100 MG tablet Take 100 mg by mouth every morning. 11/24/22   [provider]      Allergies    Ambien [zolpidem], Cymbalta [duloxetine hcl], Desyrel [trazodone], Erythromycin, Remeron [mirtazapine], Flexeril [cyclobenzaprine], and Valium [diazepam]    Review of Systems   Review of Systems  Gastrointestinal:  Negative for vomiting.  Skin:  Positive for color change.    Physical Exam Updated Vital Signs BP (!) 142/93 (BP Location: Right Arm)   Pulse 60   Temp 97.8 F (36.6 C) (Oral)   Resp (!) 30   Ht 1.499 m (4\' 11" )   Wt 57.4 kg   SpO2 99%   BMI 25.56 kg/m  Physical Exam CONSTITUTIONAL: Elderly, no acute distress HEAD: Normocephalic/atraumatic EYES: Large left periorbital hematoma, EOMI/PERRL, no hyphema, no proptosis ENMT: Mucous membranes moist, bruising noted left face, see photo SPINE/BACK:entire spine nontender No bruising/crepitance/stepoffs noted to spine Kyphotic spine CV: S1/S2 noted, no murmurs/rubs/gallops noted LUNGS: Lungs are clear to auscultation bilaterally, no apparent distress  Chest-no tenderness or bruising ABDOMEN: soft, nontender NEURO: Pt is awake/alert/appropriate, moves all extremitiesx4.  No facial droop.   EXTREMITIES: pulses normal/equal, full ROM Pelvis stable All other extremities/joints palpated/ranged and nontender SKIN: warm, see photo    ED Results / Procedures / Treatments   Labs (all labs ordered are listed, but only abnormal results are displayed) Labs Reviewed - No data to display  EKG EKG Interpretation Date/Time:  Monday March 20 2023 03:33:41  EST Ventricular Rate:  75 PR Interval:  146 QRS Duration:  95 QT Interval:  384 QTC Calculation: 429 R Axis:   38  Text Interpretation: Sinus rhythm Confirmed by Zadie Rhine ([id number]) on 03/20/2023 3:39:35 AM  Radiology DG Chest Port 1 View Result Date: 03/20/2023 CLINICAL DATA:  89 year old female status post fall, on blood thinners. Struck face. EXAM: PORTABLE CHEST 1 VIEW COMPARISON:  Portable chest 12/04/2022 and earlier. FINDINGS: Portable AP supine view at 0409 hours. Mildly lower lung volumes. Stable cardiomegaly and mediastinal contours. Calcified aortic atherosclerosis. Allowing for portable technique the lungs are clear. No pneumothorax or pleural effusion. Paucity of bowel gas in the visible abdomen. No acute osseous abnormality identified. IMPRESSION: 1. No acute cardiopulmonary abnormality or acute traumatic injury identified. 2. Stable cardiomegaly. Aortic Atherosclerosis (ICD10-I70.0). Electronically Signed   By: Odessa Fleming M.D.   On: 03/20/2023 05:16   CT CERVICAL SPINE WO CONTRAST Result Date: 03/20/2023 CLINICAL DATA:  89 year old female status post fall, on blood thinners. Struck face. EXAM: CT CERVICAL SPINE WITHOUT CONTRAST TECHNIQUE: Multidetector CT imaging of the cervical spine was performed without intravenous contrast. Multiplanar CT image reconstructions were also generated. RADIATION DOSE REDUCTION: This exam was performed according to the departmental dose-optimization program which includes automated exposure control, adjustment of the mA and/or kV according to patient size and/or use of iterative reconstruction technique. COMPARISON:  CT head and face today.  Cervical spine CT 11/16/2022. FINDINGS: Alignment: Similar exaggerated cervical lordosis. Stable mild degenerative anterolisthesis C3-C4, C4-C5. Cervicothoracic junction alignment is within normal limits. Bilateral posterior element alignment is within normal limits. Skull base and vertebrae: Bone mineralization  is within normal limits for age. Visualized skull base is intact. No atlanto-occipital dissociation. C1 and C2 appear intact and aligned. No acute osseous abnormality identified in the cervical spine. Soft tissues and spinal canal: No prevertebral fluid or swelling. No visible canal hematoma. Face reported separately. Negative visible noncontrast deep soft tissue spaces of the neck. Mild for age calcified cervical carotid atherosclerosis. Disc levels: Cervical spine degeneration including multilevel mild spondylolisthesis. Chronic facet arthropathy on the left at C3-C4 is stable. Mild if any associated cervical spinal stenosis. Upper chest: T1 superior endplate compression fracture is new since August with 10% loss of anterior T1 vertebral height (sagittal image 34). No retropulsion or complicating features. T1 posterior elements appear intact and aligned. Superior T2 endplate appears stable and intact. Large upper lung volumes, mild upper lung scarring appears stable. Negative visible noncontrast superior mediastinum. IMPRESSION: 1. Mild T1 superior endplate compression fracture is new since August and may be acute. 10% loss of height, no retropulsion or complicating features. 2. No acute traumatic injury identified in the cervical spine. Stable cervical spine degeneration. Electronically Signed   By: Odessa Fleming M.D.   On: 03/20/2023 04:39   CT MAXILLOFACIAL WO CONTRAST Result Date: 03/20/2023 CLINICAL DATA:  89 year old female status post fall, on blood thinners. Struck face. EXAM: CT MAXILLOFACIAL WITHOUT CONTRAST TECHNIQUE: Multidetector CT imaging of the maxillofacial structures was performed. Multiplanar CT image reconstructions were  also generated. RADIATION DOSE REDUCTION: This exam was performed according to the departmental dose-optimization program which includes automated exposure control, adjustment of the mA and/or kV according to patient size and/or use of iterative reconstruction technique.  COMPARISON:  Head CT today, head CT 11/16/2022 and earlier. CTA head and neck 04/17/2018. FINDINGS: Osseous: Mandible intact and normally located. No acute dental finding identified. Mild torus mandibularis, normal variant. Bilateral maxilla and zygoma appear symmetric and intact. Pterygoid bones intact. No nasal bone fracture identified. Central skull base appears intact. Cervical spine detailed separately. Orbits: No orbital wall fracture. Large left Peri or hematoma with preseptal space and additional left face involvement, blood products tracking inferiorly through the left premalar space. Underlying left globe and intraorbital soft tissues appear stable, intact. Contralateral right orbit stable and negative. Sinuses: Paranasal sinuses remain well aerated. There is some chronic sinus mucoperiosteal thickening. Tympanic cavities and mastoids are clear. Soft tissues: Lobulated left periorbital, left face premalar superficial soft tissue hematoma is nearly 2 cm in thickness. No soft tissue gas associated. Negative visible noncontrast larynx, pharynx, parapharyngeal spaces, retropharyngeal space, sublingual space, submandibular spaces, masticator and parotid spaces. Limited intracranial: Stable to that reported separately. IMPRESSION: Large left anterior face and periorbital superficial soft tissue hematoma. No underlying orbit or facial fracture. No globe or postseptal orbit injury. Electronically Signed   By: Odessa Fleming M.D.   On: 03/20/2023 04:34   CT HEAD WO CONTRAST Result Date: 03/20/2023 CLINICAL DATA:  89 year old female status post fall, on blood thinners. Struck face. EXAM: CT HEAD WITHOUT CONTRAST TECHNIQUE: Contiguous axial images were obtained from the base of the skull through the vertex without intravenous contrast. RADIATION DOSE REDUCTION: This exam was performed according to the departmental dose-optimization program which includes automated exposure control, adjustment of the mA and/or kV  according to patient size and/or use of iterative reconstruction technique. COMPARISON:  Face CT today. Head CT 11/16/2022. Brain MRI 04/18/2018. FINDINGS: Brain: Cerebral volume remains normal for age. No midline shift, ventriculomegaly, mass effect, evidence of mass lesion, intracranial hemorrhage or evidence of cortically based acute infarction. Confluent chronic bilateral white matter hypodensity. Stable gray-white matter differentiation throughout the brain. Vascular: Calcified atherosclerosis at the skull base. No suspicious intracranial vascular hyperdensity. Skull: Facial bones are detailed separately today. No calvarium fracture identified. Sinuses/Orbits: Visualized paranasal sinuses and mastoids are stable and well aerated. Other: Periorbital, preseptal soft tissue swelling and hematoma up to 1.7 cm in thickness. See face CT reported separately. Superior scalp soft tissues remain normal. IMPRESSION: 1. Large left Periorbital hematoma. See Face CT reported separately. 2. No acute intracranial abnormality. Stable non contrast CT appearance of chronic white matter disease. Electronically Signed   By: Odessa Fleming M.D.   On: 03/20/2023 04:30    Procedures Procedures    Medications Ordered in ED Medications  acetaminophen (TYLENOL) tablet 1,000 mg (1,000 mg Oral Given 03/20/23 0515)    ED Course/ Medical Decision Making/ A&P Clinical Course as of 03/20/23 0522  Mon Mar 20, 2023  0453 No acute traumatic injuries to the head or face.  Patient with large hematoma to the face that is unchanged.  There is no proptosis and she has full range of motion of left eye. I have advised to use ice for swelling [DW]  0453 CT C-spine reveals small compression fracture in thoracic spine, unclear acuity. There is no cervical spine injury. [DW]    Clinical Course User Index [DW] Zadie Rhine, MD  Medical Decision Making Amount and/or Complexity of Data Reviewed Radiology:  ordered.  Risk OTC drugs.   This patient presents to the ED for concern of fall with head injury, this involves an extensive number of treatment options, and is a complaint that carries with it a high risk of complications and morbidity.  The differential diagnosis includes but is not limited to subdural hematoma, subarachnoid hemorrhage, skull fracture, concussion  Comorbidities that complicate the patient evaluation: Patient's presentation is complicated by their history of CVA  Social Determinants of Health: Patient's  poor mobility   increases the complexity of managing their presentation  Additional history obtained: Additional history obtained from EMS  Records reviewed previous admission documents   Imaging Studies ordered: I ordered imaging studies including CT scan head, C-spine, maxillofacial and X-ray chest and left shoulder   I independently visualized and interpreted imaging which showed no acute injury to head/face/C-spine I agree with the radiologist interpretation    Medicines ordered and prescription drug management: I ordered medication including Tylenol for pain Reevaluation of the patient after these medicines showed that the patient    stayed the same   Reevaluation: After the interventions noted above, I reevaluated the patient and found that they have :improved  Complexity of problems addressed: Patient's presentation is most consistent with  acute presentation with potential threat to life or bodily function  Disposition: After consideration of the diagnostic results and the patient's response to treatment,  I feel that the patent would benefit from discharge   .    Patient with mechanical fall at nursing facility.  After falling her rollator hit her head.  Patient is on Plavix which has contributed to the large hematoma around her left eye.  It has not significantly worsened here in the ER. Other than compression fracture which may not be acute, no  other signs of acute traumatic injury. Patient has no signs of chest or abdominal trauma. No signs of any lower extremity trauma Patient safe for discharge back to facility       Final Clinical Impression(s) / ED Diagnoses Final diagnoses:  Concussion without loss of consciousness, initial encounter  Strain of neck muscle, initial encounter  Periorbital hematoma of left eye  Compression fracture of T1 vertebra, initial encounter (HCC)  Sprain of left shoulder, unspecified shoulder sprain type, initial encounter    Rx / DC Orders ED Discharge Orders     None         Zadie Rhine, MD 03/20/23 [phone]

## 2023-03-20 NOTE — ED Triage Notes (Signed)
Pt woke up at around 0300, got up to go to toilet beside bed. Pt had fall and then rollator fell on top of her head. Pt has large hematoma to left eye. Pt is on plavix. Denies LOC. Pt having left shoulder pain. C collar applied.

## 2023-03-23 DIAGNOSIS — Z515 Encounter for palliative care: Secondary | ICD-10-CM | POA: Diagnosis not present

## 2023-04-06 ENCOUNTER — Telehealth: Payer: Self-pay

## 2023-04-06 NOTE — Progress Notes (Signed)
 Transition Care Management Follow-up Telephone Call Date of discharge and from where: Valerie Barker 12/16 How have you been since you were released from the hospital? Patient has been following up with providers that come to her home  Any questions or concerns? No  Items Reviewed: Did the pt receive and understand the discharge instructions provided? Yes  Medications obtained and verified? Yes  Other? No  Any new allergies since your discharge? No  Dietary orders reviewed? No Do you have support at home? Yes     Follow up appointments reviewed:  PCP Hospital f/u appt confirmed? Yes  Scheduled to see  on  @ . Specialist Hospital f/u appt confirmed? No  Scheduled to see  on  @ . Are transportation arrangements needed? No  If their condition worsens, is the pt aware to call PCP or go to the Emergency Dept.? Yes Was the patient provided with contact information for the PCP's office or ED? Yes Was to pt encouraged to call back with questions or concerns? Yes

## 2023-04-19 DIAGNOSIS — Z515 Encounter for palliative care: Secondary | ICD-10-CM | POA: Diagnosis not present

## 2023-04-29 ENCOUNTER — Emergency Department (HOSPITAL_COMMUNITY)
Admission: EM | Admit: 2023-04-29 | Discharge: 2023-04-29 | Disposition: A | Discharge status: Home / Self Care | Payer: Medicare Other | Attending: Emergency Medicine | Admitting: Emergency Medicine

## 2023-04-29 ENCOUNTER — Emergency Department (HOSPITAL_COMMUNITY): Payer: Medicare Other

## 2023-04-29 DIAGNOSIS — M542 Cervicalgia: Secondary | ICD-10-CM | POA: Diagnosis not present

## 2023-04-29 DIAGNOSIS — Z79899 Other long term (current) drug therapy: Secondary | ICD-10-CM | POA: Diagnosis not present

## 2023-04-29 DIAGNOSIS — D689 Coagulation defect, unspecified: Secondary | ICD-10-CM | POA: Diagnosis not present

## 2023-04-29 DIAGNOSIS — M47812 Spondylosis without myelopathy or radiculopathy, cervical region: Secondary | ICD-10-CM | POA: Diagnosis not present

## 2023-04-29 DIAGNOSIS — W19XXXA Unspecified fall, initial encounter: Secondary | ICD-10-CM | POA: Diagnosis not present

## 2023-04-29 DIAGNOSIS — L089 Local infection of the skin and subcutaneous tissue, unspecified: Secondary | ICD-10-CM

## 2023-04-29 DIAGNOSIS — S59901A Unspecified injury of right elbow, initial encounter: Secondary | ICD-10-CM | POA: Diagnosis present

## 2023-04-29 DIAGNOSIS — Z043 Encounter for examination and observation following other accident: Secondary | ICD-10-CM | POA: Diagnosis not present

## 2023-04-29 DIAGNOSIS — S0990XA Unspecified injury of head, initial encounter: Secondary | ICD-10-CM | POA: Diagnosis not present

## 2023-04-29 DIAGNOSIS — Y92000 Kitchen of unspecified non-institutional (private) residence as  the place of occurrence of the external cause: Secondary | ICD-10-CM | POA: Insufficient documentation

## 2023-04-29 DIAGNOSIS — Z7902 Long term (current) use of antithrombotics/antiplatelets: Secondary | ICD-10-CM | POA: Insufficient documentation

## 2023-04-29 DIAGNOSIS — R519 Headache, unspecified: Secondary | ICD-10-CM | POA: Diagnosis not present

## 2023-04-29 DIAGNOSIS — Z743 Need for continuous supervision: Secondary | ICD-10-CM | POA: Diagnosis not present

## 2023-04-29 DIAGNOSIS — M4854XA Collapsed vertebra, not elsewhere classified, thoracic region, initial encounter for fracture: Secondary | ICD-10-CM | POA: Diagnosis not present

## 2023-04-29 DIAGNOSIS — S5001XA Contusion of right elbow, initial encounter: Secondary | ICD-10-CM | POA: Diagnosis not present

## 2023-04-29 DIAGNOSIS — I10 Essential (primary) hypertension: Secondary | ICD-10-CM | POA: Insufficient documentation

## 2023-04-29 DIAGNOSIS — S50311A Abrasion of right elbow, initial encounter: Secondary | ICD-10-CM | POA: Diagnosis not present

## 2023-04-29 LAB — BASIC METABOLIC PANEL
Anion gap: 12 (ref 5–15)
BUN: 24 mg/dL — ABNORMAL HIGH (ref 8–23)
CO2: 22 mmol/L (ref 22–32)
Calcium: 9.7 mg/dL (ref 8.9–10.3)
Chloride: 103 mmol/L (ref 98–111)
Creatinine, Ser: 1.21 mg/dL — ABNORMAL HIGH (ref 0.44–1.00)
GFR, Estimated: 41 mL/min — ABNORMAL LOW (ref >60)
Glucose, Bld: 101 mg/dL — ABNORMAL HIGH (ref 70–99)
Potassium: 3.8 mmol/L (ref 3.5–5.1)
Sodium: 137 mmol/L (ref 135–145)

## 2023-04-29 LAB — CBC WITH DIFFERENTIAL/PLATELET
Abs Immature Granulocytes: 0.04 10*3/uL (ref 0.00–0.07)
Basophils Absolute: 0 10*3/uL (ref 0.0–0.1)
Basophils Relative: 1 %
Eosinophils Absolute: 0.1 10*3/uL (ref 0.0–0.5)
Eosinophils Relative: 2 %
HCT: 36.4 % (ref 36.0–46.0)
Hemoglobin: 12.1 g/dL (ref 12.0–15.0)
Immature Granulocytes: 1 %
Lymphocytes Relative: 25 %
Lymphs Abs: 1.3 10*3/uL (ref 0.7–4.0)
MCH: 31 pg (ref 26.0–34.0)
MCHC: 33.2 g/dL (ref 30.0–36.0)
MCV: 93.3 fL (ref 80.0–100.0)
Monocytes Absolute: 0.6 10*3/uL (ref 0.1–1.0)
Monocytes Relative: 10 %
Neutro Abs: 3.4 10*3/uL (ref 1.7–7.7)
Neutrophils Relative %: 61 %
Platelets: 203 10*3/uL (ref 150–400)
RBC: 3.9 MIL/uL (ref 3.87–5.11)
RDW: 14.4 % (ref 11.5–15.5)
WBC: 5.5 10*3/uL (ref 4.0–10.5)
nRBC: 0 % (ref 0.0–0.2)

## 2023-04-29 LAB — PROTIME-INR
INR: 1 (ref 0.8–1.2)
Prothrombin Time: 13.8 s (ref 11.4–15.2)

## 2023-04-29 MED ORDER — ROPINIROLE HCL 1 MG PO TABS
2.0000 mg | ORAL_TABLET | Freq: Once | ORAL | Status: AC
  Administered 2023-04-29: 2 mg via ORAL
  Filled 2023-04-29: qty 2

## 2023-04-29 MED ORDER — ROPINIROLE HCL 1 MG PO TABS
1.0000 mg | ORAL_TABLET | Freq: Once | ORAL | Status: DC
  Filled 2023-04-29: qty 1

## 2023-04-29 NOTE — Progress Notes (Signed)
Orthopedic Tech Progress Note Patient Details:  Valerie Barker 25-Apr-1922 [id number] Responded to level 2 trauma, not needed at this time  Patient ID: Valerie Barker, female   DOB: Jul 06, 1922, 89 y.o.   MRN: [id number]  Valerie Barker 04/29/2023, 5:01 AM

## 2023-04-29 NOTE — ED Provider Notes (Signed)
  Physical Exam  BP 138/60   Pulse 64   Temp 97.6 F (36.4 C) (Oral)   Resp 16   Ht 4\' 11"  (1.499 m)   Wt 57.4 kg   SpO2 100%   BMI 25.56 kg/m   Physical Exam Vitals and nursing note reviewed.  Constitutional:      General: She is not in acute distress. Cardiovascular:     Pulses: Normal pulses.  Musculoskeletal:        General: Normal range of motion.     Comments: Full flexion/extension at right elbow without discomfort.   Neurological:     Mental Status: She is alert and oriented to person, place, and time.     Procedures  Procedures  ED Course / MDM    Medical Decision Making Amount and/or Complexity of Data Reviewed Labs: ordered. Radiology: ordered.  Risk Prescription drug management.   Mechanical fall, caught foot on walker No LOC, vomiting On thinners - Plavix Head and neck CT negative Pending xray elbow interpretation  Elbow xray negative per radiology interpretation. Patient remains awake and alert, in NAD. Son at bedside. She is felt appropriate for discharge. Son to transport home to [facility].        Elpidio Anis, PA-C 04/29/23 0753    Dione Booze, MD 04/29/23 1610    Dione Booze, MD 04/30/23 [phone]

## 2023-04-29 NOTE — ED Provider Notes (Incomplete)
89 year old female brought in as a level 2 trauma because of a fall at home while taking clopidogrel.  She apparently tripped while going to get some food.  She suffered an injury to her right elbow but denies other injury.  On exam, there is a skin tear present over the right elbow but no other external signs of trauma.  X-rays of the right elbow has been ordered as well as CT of head and cervical spine.

## 2023-04-29 NOTE — ED Notes (Signed)
RN and pt aide assisted pt with getting dressed. Pt son updated on discharge and will take pt back to facility.

## 2023-04-29 NOTE — ED Triage Notes (Signed)
Coming from [place], pt was on kitchen slip and felt. Hit right elbow and head.   On assessment Aox4, report some pain on right elbow, skin tear and hematoma present. Marland Kitchen

## 2023-04-29 NOTE — ED Notes (Signed)
Trauma Response Nurse Documentation   Valerie Barker is a 89 y.o. female arriving to [facility] ED via [facility]  On clopidogrel 75 mg daily. Trauma was activated as a Level 2 by Clelia Schaumann based on the following trauma criteria Elderly patients > 65 with head trauma on anti-coagulation (excluding ASA).  Patient cleared for CT by Dr. Preston Fleeting. Pt transported to CT with trauma response nurse present to monitor. RN remained with the patient throughout their absence from the department for clinical observation.   GCS 15.  Trauma MD Arrival Time: N/A.  History   Past Medical History:  Diagnosis Date   Acute urinary retention 04/17/2018   Heart disease    Hypertension    IBS (irritable bowel syndrome)    Stroke (HCC) 04/17/2018   around 2011   TIA (transient ischemic attack)      Past Surgical History:  Procedure Laterality Date   ABDOMINAL HYSTERECTOMY  1978   BUNIONECTOMY  2006, 1996   LAPAROSCOPIC SALPINGO OOPHERECTOMY  1988       Initial Focused Assessment (If applicable, or please see trauma documentation): Airway-- intact, no visible obstruction Breathing-- spontaneous, unlabored Circulation-- no obvious bleeding noted, skin tear to right elbow, bleeding controlled  CT's Completed:   CT Head and CT C-Spine   Interventions:  See event summary  Plan for disposition:  Unknown at this time.  Consults completed:  none at 0700.  Event Summary: Patient brought in by [facility] from [place]. Patient with a mechanical fall this morning, struck head on floor. Patient arrives alert and oriented x4, GCS 15. Transferred from EMS stretcher to hospital stretcher. Manual BP obtained. Skin tear to right elbow, bleeding controlled. Xray right elbow completed. Patient to CT with primary RN. CT head and c-spine completed.  MTP Summary (If applicable):  N/A  Bedside handoff with ED RN Mikle Bosworth.    [name]  Trauma Response RN  Please call TRN  at [phone] for further assistance.

## 2023-04-29 NOTE — ED Provider Notes (Signed)
[place] EMERGENCY DEPARTMENT AT [facility] Provider Note   CSN: [id number] Arrival date & time: 04/29/23  0447     History  Chief Complaint  Patient presents with   Valerie Barker    Khayla Koppenhaver is a 89 y.o. female who presents via EMS from [employer] for fall on thinners.  Patient states she was going to the kitchen to get something to eat, lost her balance and fell hitting her right elbow and her head on the floor.  No LOC, no nausea vomiting blurry or double vision.  No confusion.  Pain only in the right elbow per patient.  Level 5 caveat due to acuity of presentation upon arrival.  History of hypertension, CVA on Plavix, tachybradycardia syndrome.  HPI     Home Medications Prior to Admission medications   Medication Sig Start Date End Date Taking? Authorizing Provider  atorvastatin (LIPITOR) 10 MG tablet Take 1 tablet (10 mg total) by mouth daily. 01/24/23   Lennette Bihari, MD  calcium carbonate (OSCAL) 1500 (600 Ca) MG TABS tablet Take 600 mg of elemental calcium by mouth daily.    [provider]  Cholecalciferol (VITAMIN D) 2000 UNITS CAPS Take 2,000 Units by mouth daily.    [provider]  clopidogrel (PLAVIX) 75 MG tablet Take 75 mg by mouth daily.    [provider]  donepezil (ARICEPT) 10 MG tablet Take 10 mg by mouth at bedtime. 10/05/22   [provider]  hydrochlorothiazide (HYDRODIURIL) 12.5 MG tablet Take 1 tablet (12.5 mg total) by mouth daily. 01/24/23   Joylene Grapes, NP  levothyroxine (SYNTHROID) 25 MCG tablet Take 25-50 mcg by mouth See admin instructions. 25 mcg once daily before breakfast Monday-Saturday. 50 mcg on Sundays.    [provider]  Multiple Vitamins-Minerals (ONE-A-DAY WOMENS 50+) TABS Take 1 tablet by mouth daily.    [provider]  polyethylene glycol (MIRALAX / GLYCOLAX) 17 g packet Take 17 g by mouth daily as needed for mild constipation. 11/18/22   Barnetta Chapel, MD   rOPINIRole (REQUIP) 1 MG tablet Take 2 mg by mouth See admin instructions. 2 mg every afternoon and 2 mg every evening 05/26/18   [provider]  trimethoprim (TRIMPEX) 100 MG tablet Take 100 mg by mouth every morning. 11/24/22   [provider]      Allergies    Ambien [zolpidem], Cymbalta [duloxetine hcl], Desyrel [trazodone], Erythromycin, Remeron [mirtazapine], Flexeril [cyclobenzaprine], and Valium [diazepam]    Review of Systems   Review of Systems  Musculoskeletal:        R elbow pain    Physical Exam Updated Vital Signs BP 120/63   Pulse 65   Temp 97.6 F (36.4 C) (Oral)   Resp 11   Ht 4\' 11"  (1.499 m)   Wt 57.4 kg   SpO2 98%   BMI 25.56 kg/m  Physical Exam Vitals and nursing note reviewed.  Constitutional:      Appearance: She is not ill-appearing or toxic-appearing.  HENT:     Head: Normocephalic and atraumatic.     Mouth/Throat:     Mouth: Mucous membranes are moist.     Pharynx: No oropharyngeal exudate or posterior oropharyngeal erythema.  Eyes:     General:        Right eye: No discharge.        Left eye: No discharge.     Extraocular Movements: Extraocular movements intact.     Conjunctiva/sclera: Conjunctivae normal.  Pupils: Pupils are equal, round, and reactive to light.  Cardiovascular:     Rate and Rhythm: Normal rate and regular rhythm.     Pulses: Normal pulses.     Heart sounds: Normal heart sounds. No murmur heard. Pulmonary:     Effort: Pulmonary effort is normal. No respiratory distress.     Breath sounds: Normal breath sounds. No wheezing or rales.  Abdominal:     General: There is no distension.     Palpations: Abdomen is soft.     Tenderness: There is no abdominal tenderness. There is no guarding or rebound.  Musculoskeletal:        General: No deformity.     Right shoulder: Normal.     Left shoulder: Normal.     Right upper arm: Normal.     Left upper arm: Normal.     Right elbow: Swelling present. Normal  range of motion.     Left elbow: Normal.     Right forearm: Normal.     Left forearm: Normal.     Right wrist: Normal.     Left wrist: Normal.     Right hand: Normal.     Left hand: Normal.       Arms:     Cervical back: Normal, normal range of motion and neck supple. No spinous process tenderness.     Thoracic back: Normal.     Lumbar back: Normal.     Right hip: Normal.     Left hip: Normal.     Right upper leg: Normal.     Left upper leg: Normal.     Right knee: Normal.     Left knee: Normal.     Right lower leg: Normal. No edema.     Left lower leg: Normal. No edema.     Right ankle: Normal.     Right Achilles Tendon: Normal.     Left ankle: Normal.     Left Achilles Tendon: Normal.     Right foot: Normal.     Left foot: Normal.     Comments: No pelvic instability  Lymphadenopathy:     Cervical: No cervical adenopathy.  Skin:    General: Skin is warm and dry.     Capillary Refill: Capillary refill takes less than 2 seconds.     Findings: Signs of injury present.  Neurological:     General: No focal deficit present.     Mental Status: She is alert and oriented to person, place, and time. Mental status is at baseline.     GCS: GCS eye subscore is 4. GCS verbal subscore is 5. GCS motor subscore is 6.     Cranial Nerves: Cranial nerves 2-12 are intact.     Sensory: Sensation is intact.     Motor: Motor function is intact.  Psychiatric:        Mood and Affect: Mood normal.     ED Results / Procedures / Treatments   Labs (all labs ordered are listed, but only abnormal results are displayed) Labs Reviewed  BASIC METABOLIC PANEL - Abnormal; Notable for the following components:      Result Value   Glucose, Bld 101 (*)    BUN 24 (*)    Creatinine, Ser 1.21 (*)    GFR, Estimated 41 (*)    All other components within normal limits  CBC WITH DIFFERENTIAL/PLATELET  PROTIME-INR    EKG EKG Interpretation Date/Time:  Saturday April 29 2023 05:29:42  EST Ventricular  Rate:  67 PR Interval:  143 QRS Duration:  101 QT Interval:  410 QTC Calculation: 433 R Axis:   39  Text Interpretation: Sinus rhythm Supraventricular bigeminy When compared with ECG of 03/20/2023, Premature atrial complexes are now present Confirmed by Dione Booze ([id number]) on 04/29/2023 5:34:59 AM  Radiology CT CERVICAL SPINE WO CONTRAST Result Date: 04/29/2023 CLINICAL DATA:  89 year old female status post fall in kitchen. Struck head. Pain. EXAM: CT CERVICAL SPINE WITHOUT CONTRAST TECHNIQUE: Multidetector CT imaging of the cervical spine was performed without intravenous contrast. Multiplanar CT image reconstructions were also generated. RADIATION DOSE REDUCTION: This exam was performed according to the departmental dose-optimization program which includes automated exposure control, adjustment of the mA and/or kV according to patient size and/or use of iterative reconstruction technique. COMPARISON:  Head CT today.  Cervical spine CT 03/20/2023. FINDINGS: Alignment: Stable cervical lordosis with multilevel degenerative appearing spondylolisthesis, most pronounced at C3-C4. Stable cervicothoracic junction alignment. Stable posterior element alignment. Skull base and vertebrae: Visualized skull base is intact. No atlanto-occipital dissociation. C1 and C2 appears stable, intact, aligned. No acute osseous abnormality identified. Soft tissues and spinal canal: No prevertebral fluid or swelling. No visible canal hematoma. Disc levels:  Stable.  Chronic severe left C3-C4 facet arthropathy. Upper chest: Mild T1 superior endplate compression fracture is stable. Upper lungs are well aerated. IMPRESSION: 1. No acute traumatic injury identified in the cervical spine. 2. Stable T1 compression fracture noted last month. Electronically Signed   By: Odessa Fleming M.D.   On: 04/29/2023 05:45   CT HEAD WO CONTRAST Result Date: 04/29/2023 CLINICAL DATA:  89 year old female status post fall in kitchen.  Struck head. Pain. EXAM: CT HEAD WITHOUT CONTRAST TECHNIQUE: Contiguous axial images were obtained from the base of the skull through the vertex without intravenous contrast. RADIATION DOSE REDUCTION: This exam was performed according to the departmental dose-optimization program which includes automated exposure control, adjustment of the mA and/or kV according to patient size and/or use of iterative reconstruction technique. COMPARISON:  Brain MRI 04/18/2018.  Head CT 03/20/2023. FINDINGS: Brain: Stable cerebral volume. No midline shift, ventriculomegaly, mass effect, evidence of mass lesion, intracranial hemorrhage or evidence of cortically based acute infarction. Chronic confluent bilateral cerebral white matter hypodensity with deep white matter capsule involvement appears stable. Stable gray-white matter differentiation throughout the brain. Vascular: Extensive Calcified atherosclerosis at the skull base. Chronic intracranial artery tortuosity. No suspicious intracranial vascular hyperdensity. Skull: No acute osseous abnormality identified. Sinuses/Orbits: Chronic sphenoid sinus mucoperiosteal thickening not significantly changed. No layering sinus hemorrhage. Other Visualized paranasal sinuses and mastoids are stable and well aerated. Other: No acute orbit or scalp soft tissue injury identified. Regressed large left periorbital scalp hematoma since last month. IMPRESSION: 1. No acute intracranial abnormality or acute traumatic injury identified. 2. Regressed large left periorbital scalp hematoma since last month. 3. Stable non contrast CT appearance of chronic small vessel disease. Electronically Signed   By: Odessa Fleming M.D.   On: 04/29/2023 05:42    Procedures Procedures    Medications Ordered in ED Medications - No data to display  ED Course/ Medical Decision Making/ A&P                                 Medical Decision Making 89 year old female with mechanical fall on thinners.  Hypertensive  on intake, vital signs otherwise normal.  Cardiopulmonary exam with systolic murmur, otherwise unremarkable, abdominal exam is benign.  GCS  of 15, nonfocal neurologic exam, no deformities to extremities though patient does have some swelling and skin tear to the right elbow as above.  Neurovascularly intact in all extremities.  No evidence of trauma to the chest or abdomen, no pelvic instability.  EDP Dr. Preston Fleeting presented to the bedside during exam.  Amount and/or Complexity of Data Reviewed Labs: ordered.    Details: CBC unremarkable, BMP with baseline creatinine, otherwise unremarkable. Radiology: ordered.    Details: CT head and C-spine without acute traumatic injuries.   Care of this patient signed out to oncoming ED provider S. Upstill, PA-C at time of shift change. All pertinent HPI, physical exam, and laboratory findings were discussed with them prior to my departure. Disposition of patient pending completion of workup, reevaluation, and clinical judgement of oncoming ED provider. Pending elbow films at time of shift change.   This chart was dictated using voice recognition software, Dragon. Despite the best efforts of this provider to proofread and correct errors, errors may still occur which can change documentation meaning.         Final Clinical Impression(s) / ED Diagnoses Final diagnoses:  None    Rx / DC Orders ED Discharge Orders     None         Sherrilee Gilles 04/29/23 2956    Dione Booze, MD 04/29/23 2130    Dione Booze, MD 04/30/23 [phone]

## 2023-04-29 NOTE — Discharge Instructions (Signed)
Take Tylenol as needed for any discomfort or soreness.   Return to the ED with any new concerns at any time.

## 2023-04-29 NOTE — ED Provider Notes (Incomplete)
[place] EMERGENCY DEPARTMENT AT [facility] Provider Note   CSN: [id number] Arrival date & time: 04/29/23  0447     History {Add pertinent medical, surgical, social history, OB history to HPI:1} Chief Complaint  Patient presents with  . Fall    Valerie Barker is a 89 y.o. female.  HPI     Home Medications Prior to Admission medications   Medication Sig Start Date End Date Taking? Authorizing Provider  atorvastatin (LIPITOR) 10 MG tablet Take 1 tablet (10 mg total) by mouth daily. 01/24/23   Lennette Bihari, MD  calcium carbonate (OSCAL) 1500 (600 Ca) MG TABS tablet Take 600 mg of elemental calcium by mouth daily.    [provider]  Cholecalciferol (VITAMIN D) 2000 UNITS CAPS Take 2,000 Units by mouth daily.    [provider]  clopidogrel (PLAVIX) 75 MG tablet Take 75 mg by mouth daily.    [provider]  donepezil (ARICEPT) 10 MG tablet Take 10 mg by mouth at bedtime. 10/05/22   [provider]  hydrochlorothiazide (HYDRODIURIL) 12.5 MG tablet Take 1 tablet (12.5 mg total) by mouth daily. 01/24/23   Joylene Grapes, NP  levothyroxine (SYNTHROID) 25 MCG tablet Take 25-50 mcg by mouth See admin instructions. 25 mcg once daily before breakfast Monday-Saturday. 50 mcg on Sundays.    [provider]  Multiple Vitamins-Minerals (ONE-A-DAY WOMENS 50+) TABS Take 1 tablet by mouth daily.    [provider]  polyethylene glycol (MIRALAX / GLYCOLAX) 17 g packet Take 17 g by mouth daily as needed for mild constipation. 11/18/22   Barnetta Chapel, MD  rOPINIRole (REQUIP) 1 MG tablet Take 2 mg by mouth See admin instructions. 2 mg every afternoon and 2 mg every evening 05/26/18   [provider]  trimethoprim (TRIMPEX) 100 MG tablet Take 100 mg by mouth every morning. 11/24/22   [provider]      Allergies    Ambien [zolpidem], Cymbalta [duloxetine hcl], Desyrel [trazodone], Erythromycin, Remeron  [mirtazapine], Flexeril [cyclobenzaprine], and Valium [diazepam]    Review of Systems   Review of Systems  Physical Exam Updated Vital Signs BP (!) 144/76   Pulse 70   Resp 16   Ht 4\' 11"  (1.499 m)   Wt 57.4 kg   SpO2 98%   BMI 25.56 kg/m  Physical Exam  ED Results / Procedures / Treatments   Labs (all labs ordered are listed, but only abnormal results are displayed) Labs Reviewed - No data to display  EKG None  Radiology No results found.  Procedures Procedures  {Document cardiac monitor, telemetry assessment procedure when appropriate:1}  Medications Ordered in ED Medications - No data to display  ED Course/ Medical Decision Making/ A&P   {   Click here for ABCD2, HEART and other calculatorsREFRESH Note before signing :1}                              Medical Decision Making  ***  {Document critical care time when appropriate:1} {Document review of labs and clinical decision tools ie heart score, Chads2Vasc2 etc:1}  {Document your independent review of radiology images, and any outside records:1} {Document your discussion with family members, caretakers, and with consultants:1} {Document social determinants of health affecting pt's care:1} {Document your decision making why or why not admission, treatments were needed:1} Final Clinical Impression(s) / ED Diagnoses Final diagnoses:  None    Rx /  DC Orders ED Discharge Orders     None

## 2023-05-12 ENCOUNTER — Ambulatory Visit: Payer: Medicare Other | Admitting: Cardiovascular Disease

## 2023-05-24 ENCOUNTER — Encounter (HOSPITAL_COMMUNITY): Payer: Self-pay | Admitting: Pharmacy Technician

## 2023-05-24 ENCOUNTER — Emergency Department (HOSPITAL_COMMUNITY): Payer: Medicare Other

## 2023-05-24 ENCOUNTER — Other Ambulatory Visit: Payer: Self-pay

## 2023-05-24 ENCOUNTER — Emergency Department (HOSPITAL_COMMUNITY)
Admission: EM | Admit: 2023-05-24 | Discharge: 2023-05-24 | Disposition: A | Discharge status: Home / Self Care | Payer: Medicare Other | Attending: Emergency Medicine | Admitting: Emergency Medicine

## 2023-05-24 DIAGNOSIS — R519 Headache, unspecified: Secondary | ICD-10-CM | POA: Diagnosis present

## 2023-05-24 DIAGNOSIS — S0990XA Unspecified injury of head, initial encounter: Secondary | ICD-10-CM

## 2023-05-24 DIAGNOSIS — Z23 Encounter for immunization: Secondary | ICD-10-CM | POA: Insufficient documentation

## 2023-05-24 DIAGNOSIS — S40012A Contusion of left shoulder, initial encounter: Secondary | ICD-10-CM | POA: Diagnosis not present

## 2023-05-24 DIAGNOSIS — G6289 Other specified polyneuropathies: Secondary | ICD-10-CM | POA: Insufficient documentation

## 2023-05-24 DIAGNOSIS — I499 Cardiac arrhythmia, unspecified: Secondary | ICD-10-CM | POA: Diagnosis not present

## 2023-05-24 DIAGNOSIS — S5002XA Contusion of left elbow, initial encounter: Secondary | ICD-10-CM | POA: Insufficient documentation

## 2023-05-24 DIAGNOSIS — Z8673 Personal history of transient ischemic attack (TIA), and cerebral infarction without residual deficits: Secondary | ICD-10-CM | POA: Insufficient documentation

## 2023-05-24 DIAGNOSIS — Z743 Need for continuous supervision: Secondary | ICD-10-CM | POA: Diagnosis not present

## 2023-05-24 DIAGNOSIS — R0689 Other abnormalities of breathing: Secondary | ICD-10-CM | POA: Diagnosis not present

## 2023-05-24 DIAGNOSIS — I10 Essential (primary) hypertension: Secondary | ICD-10-CM | POA: Diagnosis not present

## 2023-05-24 DIAGNOSIS — W19XXXA Unspecified fall, initial encounter: Secondary | ICD-10-CM | POA: Diagnosis not present

## 2023-05-24 DIAGNOSIS — G629 Polyneuropathy, unspecified: Secondary | ICD-10-CM | POA: Diagnosis not present

## 2023-05-24 DIAGNOSIS — M25522 Pain in left elbow: Secondary | ICD-10-CM | POA: Diagnosis not present

## 2023-05-24 DIAGNOSIS — S0083XA Contusion of other part of head, initial encounter: Secondary | ICD-10-CM | POA: Diagnosis not present

## 2023-05-24 DIAGNOSIS — I6529 Occlusion and stenosis of unspecified carotid artery: Secondary | ICD-10-CM | POA: Diagnosis not present

## 2023-05-24 DIAGNOSIS — M79622 Pain in left upper arm: Secondary | ICD-10-CM | POA: Diagnosis not present

## 2023-05-24 DIAGNOSIS — I672 Cerebral atherosclerosis: Secondary | ICD-10-CM | POA: Diagnosis not present

## 2023-05-24 DIAGNOSIS — S199XXA Unspecified injury of neck, initial encounter: Secondary | ICD-10-CM | POA: Diagnosis not present

## 2023-05-24 DIAGNOSIS — W010XXA Fall on same level from slipping, tripping and stumbling without subsequent striking against object, initial encounter: Secondary | ICD-10-CM | POA: Insufficient documentation

## 2023-05-24 DIAGNOSIS — R0682 Tachypnea, not elsewhere classified: Secondary | ICD-10-CM | POA: Diagnosis not present

## 2023-05-24 LAB — CBG MONITORING, ED: Glucose-Capillary: 118 mg/dL — ABNORMAL HIGH (ref 70–99)

## 2023-05-24 MED ORDER — TETANUS-DIPHTH-ACELL PERTUSSIS 5-2.5-18.5 LF-MCG/0.5 IM SUSY
0.5000 mL | PREFILLED_SYRINGE | Freq: Once | INTRAMUSCULAR | Status: AC
  Administered 2023-05-24: 0.5 mL via INTRAMUSCULAR
  Filled 2023-05-24: qty 0.5

## 2023-05-24 MED ORDER — LIDOCAINE 5 % EX PTCH
1.0000 | MEDICATED_PATCH | CUTANEOUS | Status: DC
  Administered 2023-05-24: 1 via TRANSDERMAL
  Filled 2023-05-24: qty 1

## 2023-05-24 NOTE — ED Provider Notes (Signed)
 [place] EMERGENCY DEPARTMENT AT [facility] Provider Note   CSN: [id number] Arrival date & time: 05/24/23  1200     History  Chief Complaint  Patient presents with   Marletta Lor    Valerie Barker is a 89 y.o. female.  89 year old female with history of stroke and hypertension who presents emergency department after a fall.  Patient reports that she was getting up this morning and felt like her feet were numb bilaterally.  Says that it caused her to have a fall.  Landed on her left side.  No LOC.  Does take Plavix.  Unclear when last dose was.  Unsure of her last tetanus shot.       Home Medications Prior to Admission medications   Medication Sig Start Date End Date Taking? Authorizing Provider  atorvastatin (LIPITOR) 10 MG tablet Take 1 tablet (10 mg total) by mouth daily. 01/24/23   Lennette Bihari, MD  calcium carbonate (OSCAL) 1500 (600 Ca) MG TABS tablet Take 600 mg of elemental calcium by mouth daily.    [provider]  Cholecalciferol (VITAMIN D) 2000 UNITS CAPS Take 2,000 Units by mouth daily.    [provider]  clopidogrel (PLAVIX) 75 MG tablet Take 75 mg by mouth daily.    [provider]  donepezil (ARICEPT) 10 MG tablet Take 10 mg by mouth at bedtime. 10/05/22   [provider]  hydrochlorothiazide (HYDRODIURIL) 12.5 MG tablet Take 1 tablet (12.5 mg total) by mouth daily. 01/24/23   Joylene Grapes, NP  levothyroxine (SYNTHROID) 25 MCG tablet Take 25-50 mcg by mouth See admin instructions. 25 mcg once daily before breakfast Monday-Saturday. 50 mcg on Sundays.    [provider]  Multiple Vitamins-Minerals (ONE-A-DAY WOMENS 50+) TABS Take 1 tablet by mouth daily.    [provider]  polyethylene glycol (MIRALAX / GLYCOLAX) 17 g packet Take 17 g by mouth daily as needed for mild constipation. 11/18/22   Barnetta Chapel, MD  rOPINIRole (REQUIP) 1 MG tablet Take 2 mg by mouth See admin instructions. 2 mg  every afternoon and 2 mg every evening 05/26/18   [provider]  trimethoprim (TRIMPEX) 100 MG tablet Take 100 mg by mouth every morning. 11/24/22   [provider]      Allergies    Ambien [zolpidem], Cymbalta [duloxetine hcl], Desyrel [trazodone], Erythromycin, Remeron [mirtazapine], Flexeril [cyclobenzaprine], and Valium [diazepam]    Review of Systems   Review of Systems  Physical Exam Updated Vital Signs BP 100/64   Pulse 93   Temp 97.8 F (36.6 C)   Resp (!) 21   Ht 4\' 11"  (1.499 m)   Wt 56.7 kg   SpO2 94%   BMI 25.25 kg/m  Physical Exam Constitutional:      General: She is not in acute distress.    Appearance: Normal appearance. She is not ill-appearing.  HENT:     Head: Normocephalic.     Comments: Bruising to the left temple    Right Ear: External ear normal.     Left Ear: External ear normal.     Mouth/Throat:     Mouth: Mucous membranes are moist.     Pharynx: Oropharynx is clear.  Eyes:     Extraocular Movements: Extraocular movements intact.     Conjunctiva/sclera: Conjunctivae normal.     Pupils: Pupils are equal, round, and reactive to light.  Neck:     Comments: No C-spine midline tenderness to palpation Cardiovascular:  Rate and Rhythm: Tachycardia present. Rhythm irregular.     Pulses: Normal pulses.     Heart sounds: Normal heart sounds.  Pulmonary:     Effort: Pulmonary effort is normal. No respiratory distress.     Breath sounds: Normal breath sounds.  Abdominal:     General: Abdomen is flat. There is no distension.     Palpations: Abdomen is soft. There is no mass.     Tenderness: There is no abdominal tenderness. There is no guarding.  Musculoskeletal:        General: No deformity. Normal range of motion.     Cervical back: No rigidity or tenderness.     Comments: No tenderness to palpation of midline thoracic or lumbar spine.  No step-offs palpated.  No tenderness to palpation of chest wall.  No bruising noted.  No  tenderness to palpation of bilateral clavicles.  No tenderness to palpation, bruising, or deformities noted of bilateral shoulders, wrists, hips, knees, or ankles.  Bruising of left humerus as well as around left elbow.  No deformity of the left elbow.  Mild tenderness to palpation  Neurological:     General: No focal deficit present.     Mental Status: She is alert and oriented to person, place, and time. Mental status is at baseline.     Cranial Nerves: No cranial nerve deficit.     Sensory: No sensory deficit.     Motor: No weakness.     ED Results / Procedures / Treatments   Labs (all labs ordered are listed, but only abnormal results are displayed) Labs Reviewed  CBG MONITORING, ED - Abnormal; Notable for the following components:      Result Value   Glucose-Capillary 118 (*)    All other components within normal limits    EKG EKG Interpretation Date/Time:  Wednesday May 24 2023 12:16:01 EST Ventricular Rate:  110 PR Interval:  152 QRS Duration:  76 QT Interval:  353 QTC Calculation: 478 R Axis:   45  Text Interpretation: Sinus tachycardia with irregular rate Borderline repolarization abnormality Confirmed by Vonita Moss [phone]) on 05/24/2023 12:56:55 PM  Radiology CT Head Wo Contrast Result Date: 05/24/2023 CLINICAL DATA:  Level 2 fall on blood thinners. Head and neck trauma. EXAM: CT HEAD WITHOUT CONTRAST CT CERVICAL SPINE WITHOUT CONTRAST TECHNIQUE: Multidetector CT imaging of the head and cervical spine was performed following the standard protocol without intravenous contrast. Multiplanar CT image reconstructions of the cervical spine were also generated. RADIATION DOSE REDUCTION: This exam was performed according to the departmental dose-optimization program which includes automated exposure control, adjustment of the mA and/or kV according to patient size and/or use of iterative reconstruction technique. COMPARISON:  CT head and C-spine 04/29/2023 FINDINGS: CT  HEAD FINDINGS Brain: No intracranial hemorrhage, mass effect, or evidence of acute infarct. No hydrocephalus. No extra-axial fluid collection. Generalized cerebral atrophy and chronic small vessel ischemic disease. Vascular: No hyperdense vessel. Intracranial arterial calcification. Skull: No fracture or focal lesion. Sinuses/Orbits: No acute finding. Other: Left face contusion/hematoma. CT CERVICAL SPINE FINDINGS Alignment: No evidence of traumatic malalignment. Chronic anterolisthesis C3 and C4. Skull base and vertebrae: No acute fracture. No primary bone lesion or focal pathologic process. Soft tissues and spinal canal: No prevertebral fluid or swelling. No visible canal hematoma. Disc levels: Unchanged multilevel spondylosis, disc space height loss, degenerative endplate changes and facet arthropathy. No severe spinal canal narrowing. Upper chest: No acute abnormality. Other: Carotid calcification. IMPRESSION: 1. No acute intracranial abnormality. 2. No  acute fracture in the cervical spine. Electronically Signed   By: Minerva Fester M.D.   On: 05/24/2023 13:14   CT Cervical Spine Wo Contrast Result Date: 05/24/2023 CLINICAL DATA:  Level 2 fall on blood thinners. Head and neck trauma. EXAM: CT HEAD WITHOUT CONTRAST CT CERVICAL SPINE WITHOUT CONTRAST TECHNIQUE: Multidetector CT imaging of the head and cervical spine was performed following the standard protocol without intravenous contrast. Multiplanar CT image reconstructions of the cervical spine were also generated. RADIATION DOSE REDUCTION: This exam was performed according to the departmental dose-optimization program which includes automated exposure control, adjustment of the mA and/or kV according to patient size and/or use of iterative reconstruction technique. COMPARISON:  CT head and C-spine 04/29/2023 FINDINGS: CT HEAD FINDINGS Brain: No intracranial hemorrhage, mass effect, or evidence of acute infarct. No hydrocephalus. No extra-axial fluid  collection. Generalized cerebral atrophy and chronic small vessel ischemic disease. Vascular: No hyperdense vessel. Intracranial arterial calcification. Skull: No fracture or focal lesion. Sinuses/Orbits: No acute finding. Other: Left face contusion/hematoma. CT CERVICAL SPINE FINDINGS Alignment: No evidence of traumatic malalignment. Chronic anterolisthesis C3 and C4. Skull base and vertebrae: No acute fracture. No primary bone lesion or focal pathologic process. Soft tissues and spinal canal: No prevertebral fluid or swelling. No visible canal hematoma. Disc levels: Unchanged multilevel spondylosis, disc space height loss, degenerative endplate changes and facet arthropathy. No severe spinal canal narrowing. Upper chest: No acute abnormality. Other: Carotid calcification. IMPRESSION: 1. No acute intracranial abnormality. 2. No acute fracture in the cervical spine. Electronically Signed   By: Minerva Fester M.D.   On: 05/24/2023 13:14   DG Humerus Left Result Date: 05/24/2023 CLINICAL DATA:  Skin tear over  left elbow and pain after fall. EXAM: LEFT HUMERUS - 2+ VIEW; LEFT ELBOW - COMPLETE 3+ VIEW COMPARISON:  Shoulder radiographs 03/20/2023 FINDINGS: Posterior laceration about the elbow. No acute fracture or dislocation in the elbow or humerus. No elbow joint effusion. IMPRESSION: No acute fracture or dislocation. Electronically Signed   By: Minerva Fester M.D.   On: 05/24/2023 13:04   DG Elbow Complete Left Result Date: 05/24/2023 CLINICAL DATA:  Skin tear over  left elbow and pain after fall. EXAM: LEFT HUMERUS - 2+ VIEW; LEFT ELBOW - COMPLETE 3+ VIEW COMPARISON:  Shoulder radiographs 03/20/2023 FINDINGS: Posterior laceration about the elbow. No acute fracture or dislocation in the elbow or humerus. No elbow joint effusion. IMPRESSION: No acute fracture or dislocation. Electronically Signed   By: Minerva Fester M.D.   On: 05/24/2023 13:04    Procedures Procedures    Medications Ordered in  ED Medications  lidocaine (LIDODERM) 5 % 1 patch (1 patch Transdermal Patch Applied 05/24/23 1322)  Tdap (BOOSTRIX) injection 0.5 mL (0.5 mLs Intramuscular Given 05/24/23 1323)    ED Course/ Medical Decision Making/ A&P                                 Medical Decision Making Amount and/or Complexity of Data Reviewed Radiology: ordered.  Risk Prescription drug management.   Valerie Barker is a 89 y.o. female with comorbidities that complicate the patient evaluation including stroke and htn on plavix who presents after a fall   Initial Ddx:  TBI, c-spine injury, mechanical fall, syncope  MDM:  Concerned about possible TBI given the patient's fall and the fact that they are on blood thinners so will obtain a head CT at this time.  With  their age we will also obtain a CT of the C-spine as well.  Appears that this was a result of mechanical fall and did not have any preceding symptoms that would suggest syncope.  No other signs of trauma at this time on physical exam.  May have neuropathy that led to this fall.  Does not complain of any back pain at this point in time though due to spinal cord compression.  Will also obtain x-rays of the left upper extremity to rule out fracture  Plan:  CT head CT C-spine Left upper extremity x-ray  ED Summary/Re-evaluation:  No acute injuries were found on the patient's imaging.  Will have them follow-up with their primary doctor in several days regarding their symptoms and peripheral neuropathy.  Patient was able to ambulate in the emergency department safely.   This patient presents to the ED for concern of complaints listed in HPI, this involves an extensive number of treatment options, and is a complaint that carries with it a high risk of complications and morbidity. Disposition including potential need for admission considered.   Dispo: DC Home. Return precautions discussed including, but not limited to, those listed in the AVS. Allowed pt  time to ask questions which were answered fully prior to dc.  Additional history obtained from EMS Records reviewed Outpatient Clinic Notes I independently reviewed the following imaging with scope of interpretation limited to determining acute life threatening conditions related to emergency care: CT Head and agree with the radiologist interpretation with the following exceptions: none I personally reviewed and interpreted cardiac monitoring: normal sinus rhythm  I personally reviewed and interpreted the pt's EKG: see above for interpretation  I have reviewed the patients home medications and made adjustments as needed Social Determinants of health:  Elderly   Final Clinical Impression(s) / ED Diagnoses Final diagnoses:  Fall, initial encounter  Injury of head, initial encounter  Other polyneuropathy    Rx / DC Orders ED Discharge Orders     None         Rondel Baton, MD 05/24/23 1952

## 2023-05-24 NOTE — Progress Notes (Signed)
 Orthopedic Tech Progress Note Patient Details:  Valerie Barker October 18, 1922 [id number] Level 2 Trauma  Patient ID: Valerie Barker, female   DOB: Aug 19, 1922, 89 y.o.   MRN: [id number]  Smitty Pluck 05/24/2023, 12:27 PM

## 2023-05-24 NOTE — ED Triage Notes (Signed)
 Pt bib ems from [facility]. This morning pt with bil numbness to feet which caused her to trip. Denies LOC, did hit head, has abrasion to L forearm. Pt with some confusion. Takes Plavix.

## 2023-05-24 NOTE — Discharge Instructions (Signed)
 You were seen for your fall and head injury and arm injury in the emergency department.  The CT scan of your head and the x-rays of your arm did not show any serious injuries.  At home, please take Tylenol for your pain and IcyHot.    Check your MyChart online for the results of any tests that had not resulted by the time you left the emergency department.   Follow-up with your primary doctor in 2-3 days regarding your visit.    Return immediately to the emergency department if you experience any of the following: Severe headaches, vomiting, or any other concerning symptoms.    Thank you for visiting our Emergency Department. It was a pleasure taking care of you today.

## 2023-05-24 NOTE — ED Notes (Signed)
 Trauma Response Nurse Documentation   Valerie Barker is a 89 y.o. female arriving to [facility] ED via EMS  On clopidogrel 75 mg daily. Trauma was activated as a Level 2 by Charge RN based on the following trauma criteria Elderly patients > 65 with head trauma on anti-coagulation (excluding ASA).  Patient cleared for CT by Dr. Eloise Harman. Pt transported to CT with trauma response nurse present to monitor. RN remained with the patient throughout their absence from the department for clinical observation.   GCS 15.  History   Past Medical History:  Diagnosis Date   Acute urinary retention 04/17/2018   Heart disease    Hypertension    IBS (irritable bowel syndrome)    Stroke (HCC) 04/17/2018   around 2011   TIA (transient ischemic attack)      Past Surgical History:  Procedure Laterality Date   ABDOMINAL HYSTERECTOMY  1978   BUNIONECTOMY  2006, 1996   LAPAROSCOPIC SALPINGO OOPHERECTOMY  1988       Initial Focused Assessment (If applicable, or please see trauma documentation): Airway - Cler Breathing - Unlabored Circulation - strong radial pulses GCS -- 15   CT's Completed:   CT Head and CT C-Spine   Interventions:  Xrays CT scans   Event Summary: To ED via [facility] - from [facility] --- has her own caregiver also- at the bedside currently. Pt states that her feet were numb and she stumbled and fell and hit her face. Small bruise noted to left cheek, left humerus area.   C/o numbness leaving her feet and lower legs, now having muscle cramps "like charlie horses" in both calves.    Lesle Chris Waleed Dettman  Trauma Response RN  Please call TRN at [phone] for further assistance.

## 2023-05-30 ENCOUNTER — Ambulatory Visit: Payer: Medicare Other | Attending: Cardiovascular Disease | Admitting: Cardiovascular Disease

## 2023-05-30 ENCOUNTER — Encounter: Payer: Self-pay | Admitting: Cardiovascular Disease

## 2023-05-30 DIAGNOSIS — I495 Sick sinus syndrome: Secondary | ICD-10-CM

## 2023-05-30 DIAGNOSIS — Z8673 Personal history of transient ischemic attack (TIA), and cerebral infarction without residual deficits: Secondary | ICD-10-CM | POA: Diagnosis not present

## 2023-05-30 DIAGNOSIS — I491 Atrial premature depolarization: Secondary | ICD-10-CM

## 2023-05-30 DIAGNOSIS — I371 Nonrheumatic pulmonary valve insufficiency: Secondary | ICD-10-CM | POA: Diagnosis not present

## 2023-05-30 DIAGNOSIS — I5189 Other ill-defined heart diseases: Secondary | ICD-10-CM

## 2023-05-30 DIAGNOSIS — I071 Rheumatic tricuspid insufficiency: Secondary | ICD-10-CM | POA: Diagnosis not present

## 2023-05-30 DIAGNOSIS — E785 Hyperlipidemia, unspecified: Secondary | ICD-10-CM

## 2023-05-30 DIAGNOSIS — I10 Essential (primary) hypertension: Secondary | ICD-10-CM | POA: Diagnosis not present

## 2023-05-30 DIAGNOSIS — I340 Nonrheumatic mitral (valve) insufficiency: Secondary | ICD-10-CM

## 2023-05-30 DIAGNOSIS — I493 Ventricular premature depolarization: Secondary | ICD-10-CM

## 2023-05-30 NOTE — Progress Notes (Unsigned)
 Patient ID: Valerie Barker, female   DOB: March 30, 1923, 89 y.o.   MRN: [id number]       HPI: Valerie Barker is a 89 y.o. female who presents to the office for a 7 month cardiology followup evaluation.  Valerie Barker is the wife of my former patient [name] who died on 2015/03/22.  Remotely, Valerie Barker suffered a TIA and has documented old asymptomatic lacunar infarcts. Prior to undergoing urologic surgery at [facility] in 2012 a nuclear perfusion study showed normal perfusion. An echo Doppler study demonstrated mild to moderate TR, trace MR, and mild aortic sclerosis without stenosis. She had normal systolic function. Carotid studies done in 2011 were normal.  She was hospitalized for 2 nights after experiencing an TIA on 01/08/2013. Her symptoms resolved spontaneously. A CT of her head showed mild atrophy with extensive supratentorial small vessel disease without intracranial mass, hemorrhage or acute appearing infarction. An MRI of her brain did not show any acute intracranial findings but did show advanced atrophy with chronic microvascular ischemic changes. MRA did not reveal any intracranial flow reducing lesions. During that hospitalization a f/u echo Doppler study  on 01/29/2013 showed an ejection fraction at 65%. There was mild aortic sclerosis without stenosis. Carotid Doppler study showed minimal plaque with less than 39% reduction. She  was sent home on atorvastatin 10 mg Plavix 75 mg in addition to her valsartan HCT 320/12.5 and zolpidem.  She has had issues with swelling of legs and feet.  She also has continued issues with restless legs.  She has been taking requip 0.5 mg shortly before going to bed, but it takes a while for the medication to work and usually does not completely suffice.  At times she has taken a half a pill if she is to have a daytime nap.  When I last saw her, she was complaining of experiencing a "bobble" sensation.  She underwent carotid duplex  imaging which was normal in November 2016.  She denies any episodes of chest pain.  She is unaware of palpitations.  She admits to being fatigued , but actually feels improved from previously.  At times if she is on her back for certain duration.  She does note transient right arm paresthesias.  She denies associated chest pressure.  She presents for evaluation.  She underwent extensive dental work with her dentist, Transport planner, and endodontist.  She also had eyelid lift surgery at  [facility] in December 2017 and tolerated this well from a cardiovascular standpoint.  When I last saw she felt well was experiencing significant tiredness and fatigue.  At times she noted  some dizziness with movement.  She has restless legs for which she is on requip in addition to gabapentin.  She denied chest pain.  Was unaware of palpitations.  I saw her in May 2019 following an episode of dizziness.  As she was getting off the toilet and was trying to get or toilet paper she bent over into the cabinet and when she stood up she became dizzy lightheaded fell backwards and hit her head.  Since she was on chronic Plavix therapy she presented to the emergency room for evaluation.  No acute abnormality was noted.  However, her pulse in the emergency room was 52 and blood pressure 107/41.  She has been on losartan HCT 100/25 mg, Toprol-XL 12.5 mg and this has been held if her heart rate is less than 55.  I reviewed her emergency room evaluation.  At that time, I elected to wean and discontinue her very low-dose Toprol-XL 12.5 mg.    I saw her in September 2019. Unfortunately her 66 year old sister passed away 4 weeks ago.  She had spoken to her sister on a daily basis.  Valerie Barker continues to live in independent living at [facility].  She denies chest pain.  She is unaware of palpitations.  She has not required use of HCTZ.  Continues to be on valsartan 320 mg for hypertension.  She is tolerating atorvastatin for  hyperlipidemia.   She has continued to live in independent living at [facility].  I last evaluated her in a telemedicine visit in January 2021.  At that time she denied  any chest pain or shortness of breath or palpitations.  She admits to significant fatigability.  She believes she is sleeping a lot of the time.  She does sleep well at night.  She has not been as active as she had in the past due to the COVID-19 pandemic.  She was concerned about the possibility of early Alzheimer's disease since she is starting to notice some short-term memory loss.  She also admits to swelling in her right foot and was given HCTZ 25 mg by Dr. Renne Crigler..   She was evaluated by Azalee Course, PA in April 2021.  At that time she was taking HCTZ 25 mg daily rather than as needed as result of leg swelling.  He was concerned that her dependent edema was due to venous stasis and recommended she change this back to just as needed.  He scheduled her for an echo Doppler study which was done on 08/11/2019 which showed an EF of 60 to 65%.  There is grade 1 diastolic dysfunction.  There were no wall motion abnormalities.  She had mildly increased RV systolic pressure 35 mg.  There was mild aortic sclerosis without stenosis.     She was evaluated by Dr. Richardean Chimera of [facility] neurology.  She had previously seen Dr. Marjory Lies and has been on treatment for restless leg syndrome.  She was evaluated for memory issues but due to complaints of significant fatigability and sleepiness she was referred for a sleep study at their office which was done on 11/03/2019.  This apparently showed mild overall sleep apnea with an AHI of 10.5/h.  AHI during REM sleep was 0.  There was concern that she may have had heart rate irregularity throughout the test and her cyclic apnea seem to correlate with her irregular heart rate and rhythm.  There was concern of whether or not she had atrial fibrillation and cardiology evaluation was advised.  She was  evaluated in the  emergency room after a mechanical fall when she was walking with her walker and tripped between the transition of the hardwood floor on carpet.  She sustained injury to her right thumb and apparently is now wearing a thumb splint for small fracture.  I saw her in September 2021 at which time she denied any chest pain or shortness of breath.  She admits to fatigue and daytime sleepiness.  She was taking hydrochlorothiazide daily, olmesartan 40 mg, and requipfor her restless legs.  She also is on atorvastatin for hyperlipidemia and levothyroxine for hypothyroidism.  She is unaware of any palpitations.  Of note, in the past when very low-dose beta-blocker therapy was instituted she did get bradycardic in the low 50s.  During that evaluation, I reviewed her sleep study and felt she most likely had frequent PACs.  I saw her in December 2021.  She continued to live in independent living at [facility].  She denies any chest pain or significant shortness of breath.  She did experience occasional feet and ankle swelling.  She admitted to being tired frequently but was sleeping well.  With her feet and ankle edema I suggested over the next 3 days she increase her HCT dose to 25 mg and depending upon swelling to take either 12.5 mg and every now and then an extra dose for 25 mg perhaps every third day as needed.  She continued to be on olmesartan 40 mg for blood pressure and was on levothyroxine.  She continued to be on Plavix with her history of TIA and was on ropinirole for her restless leg syndrome.  I saw her on June 24, 2020.  At that time she admitted to being very tired and could not sleep at any time.  She was sleeping well at night.  She has a caregiver in the afternoon from noon until 5 PM.  She also has started physical therapy.  Her physical therapist had noted some mild heart rate irregularity and our office was notified.  She denied any chest pain.  She denied any dizziness.  Her last echo Doppler study  in May 2021 showed an EF of 60 to 65% with grade 1 diastolic dysfunction, mild aortic sclerosis without stenosis and normal right heart pressures.  There was mild concentric LVH.  During that evaluation her blood pressure was low and a repeat by me was 110/60.  I suggested she decrease olmesartan from 40 mg down to 20 mg and continue the HCTZ as prescribed for leg edema.  Her ECG showed isolated PVCs with a ventricular rate at 60.  However with her prior beta-blocker induced bradycardia I did not recommend reinstitution of beta-blockade in the setting of normal LV function.  I saw her in July 2022.  She continues to live in independent living at [facility].  She has a caregiver through [facility] and she states the caregivers have been excellent.  She is able to walk with a walker.  She denies any chest pain.  She does note mild shortness of breath if she tries to walk fast but otherwise feels great.  She is sleeping well.  She denies any dizziness.  She has not had any recent falls.  She is unaware of palpitations.   When I saw her on Aug 25, 2021 she continued to feel well.  She admits to taking frequent naps during the day.  She often goes to bed at midnight and wakes up at 9 AM.  Her caregiver is with her from noon until 5 PM.  Since last July 2022 she fell on 1 occasion and over the last 3 years had 3 falls which often occurred by tripping over her rug.  She sees Dr. Merri Brunette for primary care.  She denies any chest pain or palpitations.  At that evaluation her blood pressure was stable on a regimen of HCTZ 12.5 mg alternating every other day with 25 mg and olmesartan 20 mg daily.  She continued to be on rec quit for restless legs and continue to be on Plavix following her TIA.  She was tolerating low-dose atorvastatin.  Since I saw her, she has been evaluated by Micah Flesher in January and most recently on June 02, 2022.  When seen in January 2024 she had noticed nonexertional chest pain.  She  underwent an echo  Doppler study on May 17, 2022 which revealed hyperdynamic LV function with EF 70 to 75%, grade 2 diastolic dysfunction, severe left atrial enlargement, moderate TR, and moderate pulmonic valve regurgitation.  I last saw her on October 25, 2022 at which time she continued to feel well.  She continues to live in [facility] in the independent living.  She is here with her care worker.  She admits to mild shortness of breath but denies any chest pain.  She tells me 1 month ago she rode a stationary exercise bike for 7 and 8 minutes without difficulty.  She continues to see Dr. Renne Crigler whowill be checking laboratory next month.  She continues to be on HCTZ and olmesartan as noted above.  She is on rec whip for restless legs.  She is on clopidogrel following her TIA and has no longer on aspirin.  She continues to be on atorvastatin with management at one half of the 10 mg pill.   Valerie Barker is here with her care worker today.  She continues to live in independent living.  She has had some falls resulting in areas of ecchymosis on her arms.  She does have some issues with balance.  She denies chest pressure.  At times there is mild shortness of breath if she walks for distance.  She continues to be on clopidogrel with her history of prior TIA.  She is on HCTZ 12.5 mg, and takes levothyroxine 25 mg on Monday through Saturday and 50 mg on Sundays.  She has restless leg syndrome and continues to be on rectal hip.  She is on atorvastatin 10 mg.  She presents for evaluation.  Past Medical History:  Diagnosis Date   Acute urinary retention 04/17/2018   Heart disease    Hypertension    IBS (irritable bowel syndrome)    Stroke (HCC) 04/17/2018   around 2011   TIA (transient ischemic attack)     Past Surgical History:  Procedure Laterality Date   ABDOMINAL HYSTERECTOMY  1978   BUNIONECTOMY  2006, 1996   LAPAROSCOPIC SALPINGO OOPHERECTOMY  1988    Allergies  Allergen Reactions   Ambien  [Zolpidem] Other (See Comments)    Unknown reaction   Cymbalta [Duloxetine Hcl] Other (See Comments)    Unknown reaction   Desyrel [Trazodone] Other (See Comments)    Unknown reaction   Erythromycin Other (See Comments)    Unknown reaction   Remeron [Mirtazapine] Other (See Comments)    Unknown reaction   Flexeril [Cyclobenzaprine] Anxiety   Valium [Diazepam] Rash    Current Outpatient Medications  Medication Sig Dispense Refill   atorvastatin (LIPITOR) 10 MG tablet Take 1 tablet (10 mg total) by mouth daily. 90 tablet 1   calcium carbonate (OSCAL) 1500 (600 Ca) MG TABS tablet Take 600 mg of elemental calcium by mouth daily.     Cholecalciferol (VITAMIN D) 2000 UNITS CAPS Take 2,000 Units by mouth daily.     clopidogrel (PLAVIX) 75 MG tablet Take 75 mg by mouth daily.     donepezil (ARICEPT) 10 MG tablet Take 10 mg by mouth at bedtime.     hydrochlorothiazide (HYDRODIURIL) 12.5 MG tablet Take 1 tablet (12.5 mg total) by mouth daily. 90 tablet 3   levothyroxine (SYNTHROID) 25 MCG tablet Take 25-50 mcg by mouth See admin instructions. 25 mcg once daily before breakfast Monday-Saturday. 50 mcg on Sundays.     Multiple Vitamins-Minerals (ONE-A-DAY WOMENS 50+) TABS Take 1 tablet by mouth daily.  polyethylene glycol (MIRALAX / GLYCOLAX) 17 g packet Take 17 g by mouth daily as needed for mild constipation. 14 each 0   rOPINIRole (REQUIP) 1 MG tablet Take 2 mg by mouth See admin instructions. 2 mg every afternoon and 2 mg every evening     trimethoprim (TRIMPEX) 100 MG tablet Take 100 mg by mouth every morning.     No current facility-administered medications for this visit.    Social History   Socioeconomic History   Marital status: Married    Spouse name: Not on file   Number of children: Not on file   Years of education: Not on file   Highest education level: Not on file  Occupational History   Not on file  Tobacco Use   Smoking status: Never   Smokeless tobacco: Never   Vaping Use   Vaping status: Never Used  Substance and Sexual Activity   Alcohol use: No   Drug use: No   Sexual activity: Not Currently  Other Topics Concern   Not on file  Social History Narrative   Lives in Independent Living at [facility].  Education BA degree.  Retired.  3 children.  Son, Cecil.  Caffeine 1=2 cups daily.   Social Drivers of Corporate investment banker Strain: Not on file  Food Insecurity: No Food Insecurity (11/16/2022)   Hunger Vital Sign    Worried About Running Out of Food in the Last Year: Never true    Ran Out of Food in the Last Year: Never true  Transportation Needs: No Transportation Needs (11/16/2022)   PRAPARE - Administrator, Civil Service (Medical): No    Lack of Transportation (Non-Medical): No  Physical Activity: Not on file  Stress: Not on file  Social Connections: Not on file  Intimate Partner Violence: Not At Risk (11/16/2022)   Humiliation, Afraid, Rape, and Kick questionnaire    Fear of Current or Ex-Partner: No    Emotionally Abused: No    Physically Abused: No    Sexually Abused: No   Socially she is widowed as of December 2016.  Her husband was a former Public house manager of education that [employer] and developed progressive dementia Prior to his death  Family History  Problem Relation Age of Onset   COPD Mother    Schizophrenia Brother    Stroke Father    Breast cancer Neg Hx    ROS General: Negative; No fevers, chills, or night sweats;  HEENT: Negative; No changes in vision or hearing, sinus congestion, difficulty swallowing Pulmonary: Negative; No cough, wheezing, shortness of breath, hemoptysis Cardiovascular: Negative; No chest pain, presyncope, syncope, palpitations Positive for leg swelling  GI: Negative; No nausea, vomiting, diarrhea, or abdominal pain GU: Positive for recurrent urinary incontinence despite her prior urologic surgery; No dysuria, hematuria, or difficulty voiding Musculoskeletal: History of right  thumb fracture Hematologic/Oncology: Negative; no easy bruising, bleeding Endocrine: Negative; no heat/cold intolerance; no diabetes Neuro: Positive for decreased short-term memory and history of TIA; Episode of right arm numbness in July 2017; no changes in balance, headaches Skin: Negative; No rashes or skin lesions Psychiatric: Negative; No behavioral problems, depression Sleep: Positive for restless leg syndrome; positive for daytime sleepiness no snoring,  bruxism, hypnogognic hallucinations, no cataplexy Other comprehensive 14 point system review is negative.   PE BP 129/69   Pulse 63   Ht 4\' 11"  (1.499 m)   Wt 121 lb 9.6 oz (55.2 kg)   SpO2 92%   BMI 24.56 kg/m  Repeat blood pressure by me was 112/68 in the sitting position  Wt Readings from Last 3 Encounters:  05/30/23 121 lb 9.6 oz (55.2 kg)  05/24/23 125 lb (56.7 kg)  04/29/23 126 lb 8.7 oz (57.4 kg)   General: Alert, oriented, no distress.  Skin: Several areas of ecchymosis on her arms HEENT: Normocephalic, atraumatic. Pupils equal round and reactive to light; sclera anicteric; extraocular muscles intact; Nose without nasal septal hypertrophy Mouth/Parynx benign; Mallinpatti scale 3 Neck: No JVD, no carotid bruits; normal carotid upstroke Lungs: clear to ausculatation and percussion; no wheezing or rales Chest wall: without tenderness to palpitation Heart: PMI not displaced, RRR, s1 s2 normal, 1/6 systolic murmur, no diastolic murmur, no rubs, gallops, thrills, or heaves Abdomen: soft, nontender; no hepatosplenomehaly, BS+; abdominal aorta nontender and not dilated by palpation. Back: no CVA tenderness Pulses 2+ Musculoskeletal: full range of motion, normal strength, no joint deformities Extremities: no clubbing cyanosis or edema, Homan's sign negative  Neurologic: grossly nonfocal; Cranial nerves grossly wnl Psychologic: Normal mood and affect     EKG Interpretation Date/Time:  Tuesday May 30 2023  14:02:05 EST Ventricular Rate:  63 PR Interval:  140 QRS Duration:  80 QT Interval:  396 QTC Calculation: 405 R Axis:   7  Text Interpretation: Normal sinus rhythm Cannot rule out Anterior infarct , age undetermined When compared with ECG of 24-May-2023 12:16, PREVIOUS ECG IS PRESENT Confirmed by Nicki Guadalajara ([id number]) on 05/30/2023 3:33:14 PM    October 25, 2022 ECG (independently read by me): Normal sinus rhythm at 69 bpm with PAC and PVC  Aug 25, 2021 ECG (independently read by me):  Sinus rhythm at 73 with PACs  October 19, 2020 ECG (independently read by me): Sinus bradycardia at 55 bpm.  No ectopy.  Normal intervals.  Low voltage precordial leads  June 24, 2020 ECG (independently read by me): Sinus rhythm at 60, PVCs     March 05, 2020 ECG (independently read by me): NSR at 16, PAC in transient trigeminal pattern  September 2021 ECG (independently read by me): Sinus rhythm with frequent PACs, QTc interval 412 ms.  No ST segment changes   September 2019 ECG (independently read by me): Normal sinus rhythm at 61 bpm.  No ectopy.  Normal intervals.  September 2018 ECG (independently read by me): sinus bradycardia 53 bpm.  Normal intervals.  No ST segment changes.  Decreased voltage anterolaterally  January 2018 ECG (independently read by me): Sinus bradycardia 57 bpm.  No ST segment changes.  There was malpositioning of her anterior leads.  May 2017 ECG (independently read by me): Sinus bradycardia 53 bpm.  No ectopy.  Normal intervals.  No ST segment changes.  November 2016 ECG (independently read by me): Sinus bradycardia 58 bpm.  Normal intervals.    October 2015 ECG (and apparently read by me) : Sinus rhythm with frequent PACs and transient atrial bigeminal pattern  Prior November 2014 ECG: Sinus rhythm with occasional PACs. Intervals are normal. Nonspecific T changes.  LABS:    Latest Ref Rng & Units 04/29/2023    5:00 AM 12/05/2022    6:03 AM 12/04/2022    9:23 PM  BMP   Glucose 70 - 99 mg/dL 130  96  865   BUN 8 - 23 mg/dL 24  21  23    Creatinine 0.44 - 1.00 mg/dL 7.84  6.96  2.95   Sodium 135 - 145 mmol/L 137  136  135   Potassium 3.5 - 5.1 mmol/L  3.8  3.7  4.1   Chloride 98 - 111 mmol/L 103  104  101   CO2 22 - 32 mmol/L 22  24  23    Calcium 8.9 - 10.3 mg/dL 9.7  8.7  9.0       Latest Ref Rng & Units 12/05/2022    6:03 AM 12/04/2022    9:23 PM 11/17/2022    7:54 AM  Hepatic Function  Total Protein 6.5 - 8.1 g/dL 5.8  6.1  6.2   Albumin 3.5 - 5.0 g/dL 3.4  3.6  3.4   AST 15 - 41 U/L 24  30  31    ALT 0 - 44 U/L 21  23  26    Alk Phosphatase 38 - 126 U/L 96  106  50   Total Bilirubin 0.3 - 1.2 mg/dL 0.5  0.5  0.5       Latest Ref Rng & Units 04/29/2023    5:00 AM 12/05/2022    6:03 AM 12/04/2022    9:23 PM  CBC  WBC 4.0 - 10.5 K/uL 5.5  6.9  6.7   Hemoglobin 12.0 - 15.0 g/dL 16.1  09.6  04.5   Hematocrit 36.0 - 46.0 % 36.4  35.6  36.5   Platelets 150 - 400 K/uL 203  202  205    Lab Results  Component Value Date   TSH 4.275 12/04/2022     Lipid Panel     Component Value Date/Time   CHOL 128 04/18/2018 0458   TRIG 67 04/18/2018 0458   HDL 67 04/18/2018 0458   CHOLHDL 1.9 04/18/2018 0458   VLDL 13 04/18/2018 0458   LDLCALC 48 04/18/2018 0458     RADIOLOGY: Ct Head (brain) Wo Contrast  01/28/2013   CLINICAL DATA:  Right upper extremity weakness and numbness  EXAM: CT HEAD WITHOUT CONTRAST  TECHNIQUE: Contiguous axial images were obtained from the base of the skull through the vertex without intravenous contrast. Study was obtained within 24 hr of patient's arrival at the emergency department.  COMPARISON:  Brain MRI April 27, 2009 and brain CT April 27, 2009  FINDINGS: There is mild diffuse atrophy. There is no mass, hemorrhage, extra-axial fluid collection, or midline shift. There is extensive small vessel disease throughout the centra semiovale bilaterally, a stable finding. There is no new gray-white compartment lesion. There is no  demonstrable acute infarct. Bony calvarium appears intact. The mastoid air cells are clear.  IMPRESSION: Mild atrophy with extensive supratentorial small vessel disease. No intracranial mass, hemorrhage, or acute appearing infarct.   Electronically Signed   By: Bretta Bang M.D.   On: 01/28/2013 14:42   Mr Brain Wo Contrast  01/28/2013   CLINICAL DATA:  Numbness in the arms. Dry hacking cough. History of hypertension and dyslipidemia.  EXAM: MRI HEAD WITHOUT CONTRAST  MRA HEAD WITHOUT CONTRAST  TECHNIQUE: Multiplanar, multiecho pulse sequences of the brain and surrounding structures were obtained without intravenous contrast. Angiographic images of the head were obtained using MRA technique without contrast.  COMPARISON:  CT 01/28/2013.  FINDINGS: MRI HEAD FINDINGS  The patient was unable to remain motionless for the exam. Small or subtle lesions could be overlooked.  No evidence for acute infarction, hemorrhage, mass lesion, hydrocephalus, or extra-axial fluid. Moderate age-related atrophy. Extensive chronic microvascular ischemic change. Basal ganglia mineralization but no foci of chronic hemorrhage. Flow voids are maintained. No osseous findings. No remote large vessel infarct. Bilateral cataract extraction. No acute sinus or mastoid fluid. Good general agreement  with prior CT.  MRA HEAD FINDINGS  Grossly patent internal carotid arteries, and basilar artery. Vertebrals are codominant. No flow-limiting intracranial stenosis, branch occlusion, or aneurysm is evident on this motion degraded exam.  IMPRESSION: MRI HEAD IMPRESSION  No acute intracranial findings are evident. There is advanced atrophy with chronic microvascular ischemic change.  MRA HEAD IMPRESSION  No intracranial flow reducing lesion is evident.   Electronically Signed   By: Davonna Belling M.D.   On: 01/28/2013 18:47   Mr Maxine Glenn Head/brain Wo Cm  01/28/2013   CLINICAL DATA:  Numbness in the arms. Dry hacking cough. History of hypertension and  dyslipidemia.  EXAM: MRI HEAD WITHOUT CONTRAST  MRA HEAD WITHOUT CONTRAST  TECHNIQUE: Multiplanar, multiecho pulse sequences of the brain and surrounding structures were obtained without intravenous contrast. Angiographic images of the head were obtained using MRA technique without contrast.  COMPARISON:  CT 01/28/2013.  FINDINGS: MRI HEAD FINDINGS  The patient was unable to remain motionless for the exam. Small or subtle lesions could be overlooked.  No evidence for acute infarction, hemorrhage, mass lesion, hydrocephalus, or extra-axial fluid. Moderate age-related atrophy. Extensive chronic microvascular ischemic change. Basal ganglia mineralization but no foci of chronic hemorrhage. Flow voids are maintained. No osseous findings. No remote large vessel infarct. Bilateral cataract extraction. No acute sinus or mastoid fluid. Good general agreement with prior CT.  MRA HEAD FINDINGS  Grossly patent internal carotid arteries, and basilar artery. Vertebrals are codominant. No flow-limiting intracranial stenosis, branch occlusion, or aneurysm is evident on this motion degraded exam.  IMPRESSION: MRI HEAD IMPRESSION  No acute intracranial findings are evident. There is advanced atrophy with chronic microvascular ischemic change.  MRA HEAD IMPRESSION  No intracranial flow reducing lesion is evident.   Electronically Signed   By: Davonna Belling M.D.   On: 01/28/2013 18:47   ECHO: 05/17/2022  1. Left ventricular ejection fraction, by estimation, is 70 to 75%. The  left ventricle has hyperdynamic function. The left ventricle has no  regional wall motion abnormalities. Left ventricular diastolic parameters  are consistent with Grade II diastolic  dysfunction (pseudonormalization). Elevated left ventricular end-diastolic  pressure.   2. Right ventricular systolic function is normal. The right ventricular  size is normal. There is moderately elevated pulmonary artery systolic  pressure.   3. Left atrial size was  severely dilated.   4. Right atrial size was mildly dilated.   5. The mitral valve is normal in structure. Mild mitral valve  regurgitation. No evidence of mitral stenosis.   6. Tricuspid valve regurgitation is moderate.   7. The aortic valve is tricuspid. Aortic valve regurgitation is not  visualized. No aortic stenosis is present.   8. Pulmonic valve regurgitation is moderate.   9. The inferior vena cava is normal in size with greater than 50%  respiratory variability, suggesting right atrial pressure of 3 mmHg.    IMPRESSION:  1. Essential hypertension   2. PVC (premature ventricular contraction)   3. PAC (premature atrial contraction)   4. Hyperlipidemia LDL goal <70   5. History of TIA (transient ischemic attack)   6. Grade II diastolic dysfunction   7. Moderate tricuspid regurgitation   8. Moderate pulmonic regurgitation   9. Mild mitral regurgitation     ASSESSMENT AND PLAN: Valerie Barker is a very pleasant 89 year old Caucasian female who has a history of prior TIA's and remotely had been  diagnosed as having a TIA with old asymptomatic lacunar infarct. On CT, MRI and  MRA studies significant small vessel disease of her brain had been demonstrated.  She has normal systolic function and evidence for aortic sclerosis on echo assessment. She has been Plavix with her TIA history.  At a previous evaluation, she had developed significant sinus bradycardia and ultimately her low-dose Toprol-XL 12.5 mg was weaned and DC'd.  At follow-up evaluation resting pulse was in the 60s.  An echo Doppler study in May 2021 showed EF at 60 to 65%, mild concentric LVH with grade 1 diastolic dysfunction.  Estimated RV systolic pressure was minimally increased at 35 mm.  There was mild aortic sclerosis without stenosis.  She has a history of increasing leg swelling which had improved with HCTZ.  On subsequent evaluations, due to low blood pressure her medical regimen has been reduced.  She continues to be  mobile and is still an independent living at [facility].  However she has had balance issues and has had falls resulting in ecchymosis.  If these continue and may be prudent to discontinue Plavix in the future.  She continues to try to ride a stationary bike.  Her echo Doppler study in February 2024 showed hyperdynamic LV function with EF 70 to 75% and grade 2 diastolic dysfunction.  There was significant dilation of left atrium, mild right atrial dilatation, mild MR and moderate TR.  She has restless legs and has been on allopurinol followed by Dr. Renne Crigler.  She continues to be on levothyroxine for hypothyroidism.  She continues to tolerate low-dose statin therapy with atorvastatin.  I discussed my plans for retirement later this year.  She has been an amazing patient to take care of her over these many years and cried in the office today with my discussion of planned retirement.  It is certainly been a pleasure to take care of her husband and herself for these many years.  I will transition her to the care of Dr. Jodelle Red at our [place] office for follow-up Cardiologic care and she will continue to see Dr. Merri Brunette for primary care.   Lennette Bihari, MD, [facility]  06/01/2023 11:12 AM

## 2023-05-30 NOTE — Patient Instructions (Signed)
 Medication Instructions:  No medication changes were made during today's visit.  *If you need a refill on your cardiac medications before your next appointment, please call your pharmacy*   Lab Work: No labs were ordered during today's visit.  If you have labs (blood work) drawn today and your tests are completely normal, you will receive your results only by: MyChart Message (if you have MyChart) OR A paper copy in the mail If you have any lab test that is abnormal or we need to change your treatment, we will call you to review the results.   Testing/Procedures: No labs were ordered during today's visit.    Follow-Up: At [facility], you and your health needs are our priority.  As part of our continuing mission to provide you with exceptional heart care, we have created designated Provider Care Teams.  These Care Teams include your primary Cardiologist (physician) and Advanced Practice Providers (APPs -  Physician Assistants and Nurse Practitioners) who all work together to provide you with the care you need, when you need it.  We recommend signing up for the patient portal called "MyChart".  Sign up information is provided on this After Visit Summary.  MyChart is used to connect with patients for Virtual Visits (Telemedicine).  Patients are able to view lab/test results, encounter notes, upcoming appointments, etc.  Non-urgent messages can be sent to your provider as well.   To learn more about what you can do with MyChart, go to [email or web address].    Your next appointment:   8 month(s)  Provider:   Jodelle Red, MD    Other Instructions Thank you for choosing [facility]!

## 2023-06-01 ENCOUNTER — Encounter: Payer: Self-pay | Admitting: Cardiovascular Disease

## 2023-06-01 DIAGNOSIS — Z515 Encounter for palliative care: Secondary | ICD-10-CM | POA: Diagnosis not present

## 2023-06-07 DIAGNOSIS — R2689 Other abnormalities of gait and mobility: Secondary | ICD-10-CM | POA: Diagnosis not present

## 2023-06-07 DIAGNOSIS — M542 Cervicalgia: Secondary | ICD-10-CM | POA: Diagnosis not present

## 2023-06-07 DIAGNOSIS — R2681 Unsteadiness on feet: Secondary | ICD-10-CM | POA: Diagnosis not present

## 2023-06-07 DIAGNOSIS — R296 Repeated falls: Secondary | ICD-10-CM | POA: Diagnosis not present

## 2023-06-08 DIAGNOSIS — M6259 Muscle wasting and atrophy, not elsewhere classified, multiple sites: Secondary | ICD-10-CM | POA: Diagnosis not present

## 2023-06-08 DIAGNOSIS — R488 Other symbolic dysfunctions: Secondary | ICD-10-CM | POA: Diagnosis not present

## 2023-06-08 DIAGNOSIS — R2681 Unsteadiness on feet: Secondary | ICD-10-CM | POA: Diagnosis not present

## 2023-06-13 DIAGNOSIS — R2681 Unsteadiness on feet: Secondary | ICD-10-CM | POA: Diagnosis not present

## 2023-06-13 DIAGNOSIS — R488 Other symbolic dysfunctions: Secondary | ICD-10-CM | POA: Diagnosis not present

## 2023-06-13 DIAGNOSIS — R296 Repeated falls: Secondary | ICD-10-CM | POA: Diagnosis not present

## 2023-06-13 DIAGNOSIS — R2689 Other abnormalities of gait and mobility: Secondary | ICD-10-CM | POA: Diagnosis not present

## 2023-06-13 DIAGNOSIS — M6259 Muscle wasting and atrophy, not elsewhere classified, multiple sites: Secondary | ICD-10-CM | POA: Diagnosis not present

## 2023-06-13 DIAGNOSIS — M542 Cervicalgia: Secondary | ICD-10-CM | POA: Diagnosis not present

## 2023-06-14 ENCOUNTER — Ambulatory Visit: Payer: Medicare Other | Admitting: Podiatry

## 2023-06-14 ENCOUNTER — Encounter: Payer: Self-pay | Admitting: Podiatry

## 2023-06-14 DIAGNOSIS — M79674 Pain in right toe(s): Secondary | ICD-10-CM

## 2023-06-14 DIAGNOSIS — B351 Tinea unguium: Secondary | ICD-10-CM | POA: Diagnosis not present

## 2023-06-14 DIAGNOSIS — D689 Coagulation defect, unspecified: Secondary | ICD-10-CM | POA: Diagnosis not present

## 2023-06-14 DIAGNOSIS — M79675 Pain in left toe(s): Secondary | ICD-10-CM

## 2023-06-14 NOTE — Progress Notes (Signed)
This patient returns to my office for at risk foot care.  This patient requires this care by a professional since this patient will be at risk due to having coagulation defect due to plavix. This patient is unable to cut nails herself since the patient cannot reach hernails.These nails are painful walking and wearing shoes.  This patient presents for at risk foot care today.  She presents to the [facility] with her son and is in a wheelchair.  General Appearance  Alert, conversant and in no acute stress.  Vascular  Dorsalis pedis and posterior tibial  pulses are palpable  bilaterally.  Capillary return is within normal limits  bilaterally. Temperature is within normal limits  bilaterally.  Neurologic  Senn-Weinstein monofilament wire test within normal limits  bilaterally. Muscle power within normal limits bilaterally.  Nails Thick disfigured discolored nails with subungual debris  from hallux to fifth toes bilaterally. No evidence of bacterial infection or drainage bilaterally.  Orthopedic  No limitations of motion  feet .  No crepitus or effusions noted.  No bony pathology or digital deformities noted.  Skin  normotropic skin with no porokeratosis noted bilaterally.  No signs of infections or ulcers noted.     Onychomycosis  Pain in right toes  Pain in left toes  Consent was obtained for treatment procedures.   Mechanical debridement of nails 1-5  bilaterally performed with a nail nipper.  Filed with dremel without incident.    Return office visit   3 months                   Told patient to return for periodic foot care and evaluation due to potential at risk complications.   Helane Gunther DPM

## 2023-06-15 DIAGNOSIS — R2689 Other abnormalities of gait and mobility: Secondary | ICD-10-CM | POA: Diagnosis not present

## 2023-06-15 DIAGNOSIS — M542 Cervicalgia: Secondary | ICD-10-CM | POA: Diagnosis not present

## 2023-06-15 DIAGNOSIS — R2681 Unsteadiness on feet: Secondary | ICD-10-CM | POA: Diagnosis not present

## 2023-06-15 DIAGNOSIS — R296 Repeated falls: Secondary | ICD-10-CM | POA: Diagnosis not present

## 2023-06-19 DIAGNOSIS — M542 Cervicalgia: Secondary | ICD-10-CM | POA: Diagnosis not present

## 2023-06-19 DIAGNOSIS — R2681 Unsteadiness on feet: Secondary | ICD-10-CM | POA: Diagnosis not present

## 2023-06-19 DIAGNOSIS — R296 Repeated falls: Secondary | ICD-10-CM | POA: Diagnosis not present

## 2023-06-19 DIAGNOSIS — R2689 Other abnormalities of gait and mobility: Secondary | ICD-10-CM | POA: Diagnosis not present

## 2023-06-20 DIAGNOSIS — R488 Other symbolic dysfunctions: Secondary | ICD-10-CM | POA: Diagnosis not present

## 2023-06-20 DIAGNOSIS — R2681 Unsteadiness on feet: Secondary | ICD-10-CM | POA: Diagnosis not present

## 2023-06-20 DIAGNOSIS — M6259 Muscle wasting and atrophy, not elsewhere classified, multiple sites: Secondary | ICD-10-CM | POA: Diagnosis not present

## 2023-06-21 DIAGNOSIS — I10 Essential (primary) hypertension: Secondary | ICD-10-CM | POA: Diagnosis not present

## 2023-06-21 DIAGNOSIS — E039 Hypothyroidism, unspecified: Secondary | ICD-10-CM | POA: Diagnosis not present

## 2023-06-21 DIAGNOSIS — M810 Age-related osteoporosis without current pathological fracture: Secondary | ICD-10-CM | POA: Diagnosis not present

## 2023-06-22 ENCOUNTER — Emergency Department (HOSPITAL_COMMUNITY)

## 2023-06-22 ENCOUNTER — Encounter (HOSPITAL_COMMUNITY): Payer: Self-pay | Admitting: Emergency Medicine

## 2023-06-22 ENCOUNTER — Inpatient Hospital Stay (HOSPITAL_COMMUNITY)
Admission: EM | Admit: 2023-06-22 | Discharge: 2023-06-27 | DRG: 558 | Disposition: A | Discharge status: Skilled Nursing Facility | Attending: Internal Medicine | Admitting: Internal Medicine

## 2023-06-22 ENCOUNTER — Other Ambulatory Visit: Payer: Self-pay

## 2023-06-22 DIAGNOSIS — E860 Dehydration: Secondary | ICD-10-CM | POA: Diagnosis not present

## 2023-06-22 DIAGNOSIS — M25561 Pain in right knee: Secondary | ICD-10-CM | POA: Diagnosis not present

## 2023-06-22 DIAGNOSIS — I5032 Chronic diastolic (congestive) heart failure: Secondary | ICD-10-CM | POA: Diagnosis not present

## 2023-06-22 DIAGNOSIS — Z8673 Personal history of transient ischemic attack (TIA), and cerebral infarction without residual deficits: Secondary | ICD-10-CM

## 2023-06-22 DIAGNOSIS — R296 Repeated falls: Secondary | ICD-10-CM | POA: Diagnosis not present

## 2023-06-22 DIAGNOSIS — Z79899 Other long term (current) drug therapy: Secondary | ICD-10-CM

## 2023-06-22 DIAGNOSIS — S0083XA Contusion of other part of head, initial encounter: Secondary | ICD-10-CM | POA: Diagnosis present

## 2023-06-22 DIAGNOSIS — R54 Age-related physical debility: Secondary | ICD-10-CM | POA: Diagnosis not present

## 2023-06-22 DIAGNOSIS — Z823 Family history of stroke: Secondary | ICD-10-CM | POA: Diagnosis not present

## 2023-06-22 DIAGNOSIS — M6282 Rhabdomyolysis: Secondary | ICD-10-CM | POA: Diagnosis not present

## 2023-06-22 DIAGNOSIS — Z881 Allergy status to other antibiotic agents status: Secondary | ICD-10-CM | POA: Diagnosis not present

## 2023-06-22 DIAGNOSIS — Z818 Family history of other mental and behavioral disorders: Secondary | ICD-10-CM

## 2023-06-22 DIAGNOSIS — I2510 Atherosclerotic heart disease of native coronary artery without angina pectoris: Secondary | ICD-10-CM | POA: Diagnosis present

## 2023-06-22 DIAGNOSIS — S3993XA Unspecified injury of pelvis, initial encounter: Secondary | ICD-10-CM | POA: Diagnosis not present

## 2023-06-22 DIAGNOSIS — Z7902 Long term (current) use of antithrombotics/antiplatelets: Secondary | ICD-10-CM

## 2023-06-22 DIAGNOSIS — M25571 Pain in right ankle and joints of right foot: Secondary | ICD-10-CM

## 2023-06-22 DIAGNOSIS — Z66 Do not resuscitate: Secondary | ICD-10-CM | POA: Diagnosis not present

## 2023-06-22 DIAGNOSIS — R488 Other symbolic dysfunctions: Secondary | ICD-10-CM | POA: Diagnosis not present

## 2023-06-22 DIAGNOSIS — I5030 Unspecified diastolic (congestive) heart failure: Secondary | ICD-10-CM | POA: Diagnosis not present

## 2023-06-22 DIAGNOSIS — W19XXXA Unspecified fall, initial encounter: Secondary | ICD-10-CM | POA: Diagnosis not present

## 2023-06-22 DIAGNOSIS — R410 Disorientation, unspecified: Secondary | ICD-10-CM | POA: Diagnosis not present

## 2023-06-22 DIAGNOSIS — N1831 Chronic kidney disease, stage 3a: Secondary | ICD-10-CM | POA: Diagnosis not present

## 2023-06-22 DIAGNOSIS — R748 Abnormal levels of other serum enzymes: Secondary | ICD-10-CM

## 2023-06-22 DIAGNOSIS — E039 Hypothyroidism, unspecified: Secondary | ICD-10-CM | POA: Diagnosis present

## 2023-06-22 DIAGNOSIS — M25562 Pain in left knee: Secondary | ICD-10-CM

## 2023-06-22 DIAGNOSIS — S199XXA Unspecified injury of neck, initial encounter: Secondary | ICD-10-CM | POA: Diagnosis not present

## 2023-06-22 DIAGNOSIS — T796XXA Traumatic ischemia of muscle, initial encounter: Secondary | ICD-10-CM | POA: Diagnosis not present

## 2023-06-22 DIAGNOSIS — R2681 Unsteadiness on feet: Secondary | ICD-10-CM | POA: Diagnosis not present

## 2023-06-22 DIAGNOSIS — Y92008 Other place in unspecified non-institutional (private) residence as the place of occurrence of the external cause: Secondary | ICD-10-CM

## 2023-06-22 DIAGNOSIS — Z888 Allergy status to other drugs, medicaments and biological substances status: Secondary | ICD-10-CM

## 2023-06-22 DIAGNOSIS — I130 Hypertensive heart and chronic kidney disease with heart failure and stage 1 through stage 4 chronic kidney disease, or unspecified chronic kidney disease: Secondary | ICD-10-CM | POA: Diagnosis not present

## 2023-06-22 DIAGNOSIS — I10 Essential (primary) hypertension: Secondary | ICD-10-CM | POA: Diagnosis not present

## 2023-06-22 DIAGNOSIS — W1839XA Other fall on same level, initial encounter: Secondary | ICD-10-CM | POA: Diagnosis present

## 2023-06-22 DIAGNOSIS — G629 Polyneuropathy, unspecified: Secondary | ICD-10-CM | POA: Diagnosis not present

## 2023-06-22 DIAGNOSIS — S0990XA Unspecified injury of head, initial encounter: Secondary | ICD-10-CM | POA: Diagnosis not present

## 2023-06-22 DIAGNOSIS — Z825 Family history of asthma and other chronic lower respiratory diseases: Secondary | ICD-10-CM

## 2023-06-22 DIAGNOSIS — G2581 Restless legs syndrome: Secondary | ICD-10-CM | POA: Diagnosis present

## 2023-06-22 DIAGNOSIS — R61 Generalized hyperhidrosis: Secondary | ICD-10-CM | POA: Diagnosis not present

## 2023-06-22 DIAGNOSIS — I672 Cerebral atherosclerosis: Secondary | ICD-10-CM | POA: Diagnosis not present

## 2023-06-22 DIAGNOSIS — S299XXA Unspecified injury of thorax, initial encounter: Secondary | ICD-10-CM | POA: Diagnosis not present

## 2023-06-22 DIAGNOSIS — K581 Irritable bowel syndrome with constipation: Secondary | ICD-10-CM | POA: Diagnosis present

## 2023-06-22 DIAGNOSIS — Z79890 Hormone replacement therapy (postmenopausal): Secondary | ICD-10-CM

## 2023-06-22 DIAGNOSIS — Z743 Need for continuous supervision: Secondary | ICD-10-CM | POA: Diagnosis not present

## 2023-06-22 DIAGNOSIS — M6259 Muscle wasting and atrophy, not elsewhere classified, multiple sites: Secondary | ICD-10-CM | POA: Diagnosis not present

## 2023-06-22 DIAGNOSIS — M25572 Pain in left ankle and joints of left foot: Secondary | ICD-10-CM | POA: Diagnosis not present

## 2023-06-22 DIAGNOSIS — I499 Cardiac arrhythmia, unspecified: Secondary | ICD-10-CM | POA: Diagnosis not present

## 2023-06-22 DIAGNOSIS — Z90710 Acquired absence of both cervix and uterus: Secondary | ICD-10-CM

## 2023-06-22 DIAGNOSIS — M160 Bilateral primary osteoarthritis of hip: Secondary | ICD-10-CM | POA: Diagnosis not present

## 2023-06-22 LAB — CBC
HCT: 40.7 % (ref 36.0–46.0)
Hemoglobin: 13 g/dL (ref 12.0–15.0)
MCH: 30.4 pg (ref 26.0–34.0)
MCHC: 31.9 g/dL (ref 30.0–36.0)
MCV: 95.3 fL (ref 80.0–100.0)
Platelets: 206 10*3/uL (ref 150–400)
RBC: 4.27 MIL/uL (ref 3.87–5.11)
RDW: 13.6 % (ref 11.5–15.5)
WBC: 12.6 10*3/uL — ABNORMAL HIGH (ref 4.0–10.5)
nRBC: 0 % (ref 0.0–0.2)

## 2023-06-22 LAB — URINALYSIS, ROUTINE W REFLEX MICROSCOPIC
Bacteria, UA: NONE SEEN
Bilirubin Urine: NEGATIVE
Glucose, UA: NEGATIVE mg/dL
Ketones, ur: 80 mg/dL — AB
Leukocytes,Ua: NEGATIVE
Nitrite: NEGATIVE
Protein, ur: 100 mg/dL — AB
Specific Gravity, Urine: 1.021 (ref 1.005–1.030)
pH: 5 (ref 5.0–8.0)

## 2023-06-22 LAB — COMPREHENSIVE METABOLIC PANEL
ALT: 32 U/L (ref 0–44)
AST: 52 U/L — ABNORMAL HIGH (ref 15–41)
Albumin: 4.2 g/dL (ref 3.5–5.0)
Alkaline Phosphatase: 51 U/L (ref 38–126)
Anion gap: 16 — ABNORMAL HIGH (ref 5–15)
BUN: 22 mg/dL (ref 8–23)
CO2: 19 mmol/L — ABNORMAL LOW (ref 22–32)
Calcium: 9.7 mg/dL (ref 8.9–10.3)
Chloride: 103 mmol/L (ref 98–111)
Creatinine, Ser: 1.2 mg/dL — ABNORMAL HIGH (ref 0.44–1.00)
GFR, Estimated: 41 mL/min — ABNORMAL LOW (ref >60)
Glucose, Bld: 112 mg/dL — ABNORMAL HIGH (ref 70–99)
Potassium: 3.9 mmol/L (ref 3.5–5.1)
Sodium: 138 mmol/L (ref 135–145)
Total Bilirubin: 0.8 mg/dL (ref 0.0–1.2)
Total Protein: 6.8 g/dL (ref 6.5–8.1)

## 2023-06-22 LAB — PROTIME-INR
INR: 1.1 (ref 0.8–1.2)
Prothrombin Time: 14.7 s (ref 11.4–15.2)

## 2023-06-22 LAB — I-STAT CHEM 8, ED
BUN: 22 mg/dL (ref 8–23)
Calcium, Ion: 0.89 mmol/L — CL (ref 1.15–1.40)
Chloride: 106 mmol/L (ref 98–111)
Creatinine, Ser: 1.1 mg/dL — ABNORMAL HIGH (ref 0.44–1.00)
Glucose, Bld: 110 mg/dL — ABNORMAL HIGH (ref 70–99)
HCT: 35 % — ABNORMAL LOW (ref 36.0–46.0)
Hemoglobin: 11.9 g/dL — ABNORMAL LOW (ref 12.0–15.0)
Potassium: 3.7 mmol/L (ref 3.5–5.1)
Sodium: 139 mmol/L (ref 135–145)
TCO2: 21 mmol/L — ABNORMAL LOW (ref 22–32)

## 2023-06-22 LAB — I-STAT CG4 LACTIC ACID, ED: Lactic Acid, Venous: 3.2 mmol/L (ref 0.5–1.9)

## 2023-06-22 LAB — SAMPLE TO BLOOD BANK

## 2023-06-22 LAB — ETHANOL: Alcohol, Ethyl (B): <10 mg/dL (ref <10)

## 2023-06-22 LAB — CK TOTAL AND CKMB (NOT AT ARMC)
CK, MB: 20.1 ng/mL — ABNORMAL HIGH (ref 0.5–5.0)
Total CK: 1052 U/L — ABNORMAL HIGH (ref 38–234)

## 2023-06-22 MED ORDER — ONDANSETRON HCL 4 MG PO TABS: 4.0000 mg | ORAL_TABLET | Freq: Four times a day (QID) | ORAL | Status: DC | PRN

## 2023-06-22 MED ORDER — ONDANSETRON HCL 4 MG/2ML IJ SOLN: 4.0000 mg | Freq: Four times a day (QID) | INTRAMUSCULAR | Status: DC | PRN

## 2023-06-22 MED ORDER — BISACODYL 5 MG PO TBEC
5.0000 mg | DELAYED_RELEASE_TABLET | Freq: Every day | ORAL | Status: DC | PRN
  Filled 2023-06-22: qty 1

## 2023-06-22 MED ORDER — CLOPIDOGREL BISULFATE 75 MG PO TABS
75.0000 mg | ORAL_TABLET | Freq: Every day | ORAL | Status: DC
  Administered 2023-06-23 – 2023-06-27 (×5): 75 mg via ORAL
  Filled 2023-06-22 (×5): qty 1

## 2023-06-22 MED ORDER — HEPARIN SODIUM (PORCINE) 5000 UNIT/ML IJ SOLN
5000.0000 [IU] | Freq: Three times a day (TID) | INTRAMUSCULAR | Status: DC
  Administered 2023-06-23 – 2023-06-27 (×12): 5000 [IU] via SUBCUTANEOUS
  Filled 2023-06-22 (×13): qty 1

## 2023-06-22 MED ORDER — ROPINIROLE HCL 1 MG PO TABS
2.0000 mg | ORAL_TABLET | ORAL | Status: DC
  Administered 2023-06-22 – 2023-06-26 (×9): 2 mg via ORAL
  Filled 2023-06-22: qty 2
  Filled 2023-06-22 (×7): qty 4
  Filled 2023-06-22: qty 2

## 2023-06-22 MED ORDER — TRIMETHOPRIM 100 MG PO TABS
100.0000 mg | ORAL_TABLET | Freq: Every morning | ORAL | Status: DC
  Administered 2023-06-23 – 2023-06-27 (×5): 100 mg via ORAL
  Filled 2023-06-22 (×5): qty 1

## 2023-06-22 MED ORDER — LEVOTHYROXINE SODIUM 25 MCG PO TABS
25.0000 ug | ORAL_TABLET | ORAL | Status: DC
  Administered 2023-06-23 – 2023-06-27 (×4): 25 ug via ORAL
  Filled 2023-06-22 (×4): qty 1

## 2023-06-22 MED ORDER — ACETAMINOPHEN 650 MG RE SUPP: 650.0000 mg | Freq: Four times a day (QID) | RECTAL | Status: DC | PRN

## 2023-06-22 MED ORDER — SODIUM CHLORIDE 0.9 % IV BOLUS
500.0000 mL | Freq: Once | INTRAVENOUS | Status: AC
  Administered 2023-06-22: 500 mL via INTRAVENOUS

## 2023-06-22 MED ORDER — LEVOTHYROXINE SODIUM 25 MCG PO TABS: 25.0000 ug | ORAL_TABLET | ORAL | Status: DC

## 2023-06-22 MED ORDER — ACETAMINOPHEN 325 MG PO TABS
650.0000 mg | ORAL_TABLET | Freq: Four times a day (QID) | ORAL | Status: DC | PRN
  Administered 2023-06-22 – 2023-06-27 (×8): 650 mg via ORAL
  Filled 2023-06-22 (×8): qty 2

## 2023-06-22 MED ORDER — LEVOTHYROXINE SODIUM 50 MCG PO TABS
50.0000 ug | ORAL_TABLET | ORAL | Status: DC
Start: 2023-06-25 — End: 2023-06-27
  Administered 2023-06-25: 50 ug via ORAL
  Filled 2023-06-22: qty 1

## 2023-06-22 MED ORDER — MELATONIN 3 MG PO TABS
3.0000 mg | ORAL_TABLET | Freq: Every evening | ORAL | Status: DC | PRN
  Administered 2023-06-22 – 2023-06-25 (×3): 3 mg via ORAL
  Filled 2023-06-22 (×3): qty 1

## 2023-06-22 MED ORDER — LACTATED RINGERS IV SOLN: INTRAVENOUS | Status: DC

## 2023-06-22 NOTE — ED Triage Notes (Signed)
 Pt BIB by [facility] from unwitnessed fall. On Plavix, No signs or symptoms of head injury. Pt alert and oriented to baseline on arrival. Dr. Rush Landmark at bedside.

## 2023-06-22 NOTE — ED Notes (Signed)
 RN contacted Aundra Millet (daughter) to update on pt's admission status.

## 2023-06-22 NOTE — H&P (Signed)
 History and Physical    Patient: Valerie Barker ZOX:[id number] DOB: 1922-10-14 DOA: 06/22/2023 DOS: the patient was seen and examined on 06/22/2023 PCP: Merri Brunette, MD  Patient coming from: Home  Chief Complaint:  Chief Complaint  Patient presents with   Leafy Half   HPI: Valerie Barker is a 89 y.o. female with medical history significant for Presents after a fall at home.  The patient was at its [place].  The staff there does rounds every 2 hours.  The patient was seen at 8:00 at baseline.  At 10:00 she was found facedown on the ground.  The patient says because of the numbness in both of her feet sometimes she cannot get traction.  She says she felt herself falling and tried to catch herself but was not able to.  She fell on the left side of her face.  She has some redness on that side of her face which she says is not new.  She says that redness actually came from her last fall.  She says at the age of 58 she is fortunate because she does not deal with a lot of pain. Her biggest trouble at this point really is the numbness in both of her feet that leads to her frequent falls. She says the numbness has never been worked up and as far she knows she has not seen a neurologist. Her workup in the emergency department included standard care for level 2 trauma.  The patient does take Plavix at baseline.  She did have a CT scan of her C-spine and head and several Xrays.  No fractures are reported.   Review of Systems: As mentioned in the history of present illness. All other systems reviewed and are negative. Past Medical History:  Diagnosis Date   Acute urinary retention 04/17/2018   Heart disease    Hypertension    IBS (irritable bowel syndrome)    Stroke (HCC) 04/17/2018   around 2011   TIA (transient ischemic attack)    Past Surgical History:  Procedure Laterality Date   ABDOMINAL HYSTERECTOMY  1978   BUNIONECTOMY  2006, 1996   LAPAROSCOPIC SALPINGO OOPHERECTOMY  1988    Social History:  reports that she has never smoked. She has never used smokeless tobacco. She reports that she does not drink alcohol and does not use drugs.  Allergies  Allergen Reactions   Ambien [Zolpidem] Other (See Comments)    Unknown reaction   Cymbalta [Duloxetine Hcl] Other (See Comments)    Unknown reaction   Desyrel [Trazodone] Other (See Comments)    Unknown reaction   Erythromycin Other (See Comments)    Unknown reaction   Remeron [Mirtazapine] Other (See Comments)    Unknown reaction   Flexeril [Cyclobenzaprine] Anxiety   Valium [Diazepam] Rash    Family History  Problem Relation Age of Onset   COPD Mother    Schizophrenia Brother    Stroke Father    Breast cancer Neg Hx     Prior to Admission medications   Medication Sig Start Date End Date Taking? Authorizing Provider  atorvastatin (LIPITOR) 10 MG tablet Take 1 tablet (10 mg total) by mouth daily. 01/24/23   Lennette Bihari, MD  calcium carbonate (OSCAL) 1500 (600 Ca) MG TABS tablet Take 600 mg of elemental calcium by mouth daily.    [provider]  Cholecalciferol (VITAMIN D) 2000 UNITS CAPS Take 2,000 Units by mouth daily.    [provider]  clopidogrel (  PLAVIX) 75 MG tablet Take 75 mg by mouth daily.    [provider]  donepezil (ARICEPT) 10 MG tablet Take 10 mg by mouth at bedtime. 10/05/22   [provider]  hydrochlorothiazide (HYDRODIURIL) 12.5 MG tablet Take 1 tablet (12.5 mg total) by mouth daily. 01/24/23   Joylene Grapes, NP  levothyroxine (SYNTHROID) 25 MCG tablet Take 25-50 mcg by mouth See admin instructions. 25 mcg once daily before breakfast Monday-Saturday. 50 mcg on Sundays.    [provider]  Multiple Vitamins-Minerals (ONE-A-DAY WOMENS 50+) TABS Take 1 tablet by mouth daily.    [provider]  polyethylene glycol (MIRALAX / GLYCOLAX) 17 g packet Take 17 g by mouth daily as needed for mild constipation. 11/18/22   Barnetta Chapel, MD   rOPINIRole (REQUIP) 1 MG tablet Take 2 mg by mouth See admin instructions. 2 mg every afternoon and 2 mg every evening 05/26/18   [provider]  trimethoprim (TRIMPEX) 100 MG tablet Take 100 mg by mouth every morning. 11/24/22   [provider]    Physical Exam: Vitals:   06/22/23 1730 06/22/23 1745 06/22/23 1845 06/22/23 1928  BP: (!) 107/48 (!) 117/52 113/61   Pulse: 75 76 77   Resp: 20 20 (!) 25   Temp:      TempSrc:      SpO2: 100% 99% 98% 100%  Weight:      Height:       Physical Exam:  General: No acute distress, frail elderly HEENT: Normocephalic, atraumatic, Pupils surgically irregular, left face red areA Cardiovascular: Normal rate and rhythm. Distal pulses intact. Pulmonary: Normal pulmonary effort, normal breath sounds Gastrointestinal: Nondistended abdomen, soft, non-tender, normoactive bowel sounds Musculoskeletal:No lower ext edema, Left foot in boot Skin: Skin is warm and dry. Neuro: No focal deficits noted, AAOx3. PSYCH: Attentive and cooperative  Data Reviewed:  Results for orders placed or performed during the hospital encounter of 06/22/23 (from the past 24 hours)  Comprehensive metabolic panel     Status: Abnormal   Collection Time: 06/22/23  1:00 PM  Result Value Ref Range   Sodium 138 135 - 145 mmol/L   Potassium 3.9 3.5 - 5.1 mmol/L   Chloride 103 98 - 111 mmol/L   CO2 19 (L) 22 - 32 mmol/L   Glucose, Bld 112 (H) 70 - 99 mg/dL   BUN 22 8 - 23 mg/dL   Creatinine, Ser 1.61 (H) 0.44 - 1.00 mg/dL   Calcium 9.7 8.9 - 09.6 mg/dL   Total Protein 6.8 6.5 - 8.1 g/dL   Albumin 4.2 3.5 - 5.0 g/dL   AST 52 (H) 15 - 41 U/L   ALT 32 0 - 44 U/L   Alkaline Phosphatase 51 38 - 126 U/L   Total Bilirubin 0.8 0.0 - 1.2 mg/dL   GFR, Estimated 41 (L) >60 mL/min   Anion gap 16 (H) 5 - 15  CBC     Status: Abnormal   Collection Time: 06/22/23  1:00 PM  Result Value Ref Range   WBC 12.6 (H) 4.0 - 10.5 K/uL   RBC 4.27 3.87 - 5.11 MIL/uL    Hemoglobin 13.0 12.0 - 15.0 g/dL   HCT 04.5 40.9 - 81.1 %   MCV 95.3 80.0 - 100.0 fL   MCH 30.4 26.0 - 34.0 pg   MCHC 31.9 30.0 - 36.0 g/dL   RDW 91.4 78.2 - 95.6 %   Platelets 206 150 - 400 K/uL   nRBC 0.0 0.0 -  0.2 %  CK total and CKMB     Status: Abnormal   Collection Time: 06/22/23  1:00 PM  Result Value Ref Range   Total CK 1,052 (H) 38 - 234 U/L   CK, MB 20.1 (H) 0.5 - 5.0 ng/mL  Sample to Blood Bank     Status: None   Collection Time: 06/22/23  1:15 PM  Result Value Ref Range   Blood Bank Specimen SAMPLE AVAILABLE FOR TESTING    Sample Expiration      06/25/2023,2359 Performed at [facility], [address] [place], [place] [address]   I-Stat Lactic Acid, ED     Status: Abnormal   Collection Time: 06/22/23  1:25 PM  Result Value Ref Range   Lactic Acid, Venous 3.2 (HH) 0.5 - 1.9 mmol/L   Comment NOTIFIED PHYSICIAN   Ethanol     Status: None   Collection Time: 06/22/23  1:57 PM  Result Value Ref Range   Alcohol, Ethyl (B) <10 <10 mg/dL  Protime-INR     Status: None   Collection Time: 06/22/23  2:00 PM  Result Value Ref Range   Prothrombin Time 14.7 11.4 - 15.2 seconds   INR 1.1 0.8 - 1.2  I-Stat Chem 8, ED     Status: Abnormal   Collection Time: 06/22/23  2:09 PM  Result Value Ref Range   Sodium 139 135 - 145 mmol/L   Potassium 3.7 3.5 - 5.1 mmol/L   Chloride 106 98 - 111 mmol/L   BUN 22 8 - 23 mg/dL   Creatinine, Ser 5.78 (H) 0.44 - 1.00 mg/dL   Glucose, Bld 469 (H) 70 - 99 mg/dL   Calcium, Ion 6.29 (LL) 1.15 - 1.40 mmol/L   TCO2 21 (L) 22 - 32 mmol/L   Hemoglobin 11.9 (L) 12.0 - 15.0 g/dL   HCT 52.8 (L) 41.3 - 24.4 %   Comment NOTIFIED PHYSICIAN   Urinalysis, Routine w reflex microscopic -Urine, Clean Catch     Status: Abnormal   Collection Time: 06/22/23  4:36 PM  Result Value Ref Range   Color, Urine YELLOW YELLOW   APPearance CLEAR CLEAR   Specific Gravity, Urine 1.021 1.005 - 1.030   pH 5.0 5.0 - 8.0   Glucose, UA NEGATIVE NEGATIVE mg/dL    Hgb urine dipstick LARGE (A) NEGATIVE   Bilirubin Urine NEGATIVE NEGATIVE   Ketones, ur 80 (A) NEGATIVE mg/dL   Protein, ur 010 (A) NEGATIVE mg/dL   Nitrite NEGATIVE NEGATIVE   Leukocytes,Ua NEGATIVE NEGATIVE   RBC / HPF 0-5 0 - 5 RBC/hpf   WBC, UA 0-5 0 - 5 WBC/hpf   Bacteria, UA NONE SEEN NONE SEEN   Squamous Epithelial / HPF 0-5 0 - 5 /HPF   Mucus PRESENT    Hyaline Casts, UA PRESENT      Assessment and Plan: S/p Fall Mild rhabdomyolysis Chronic numbess in both feet causing recurrent falls  - Iv fluids - PT/OT  - Boot for comfort   Advance Care Planning:   Code Status: Do not attempt resuscitation (DNR) - Comfort care the patient tells me her son Lorin Picket is her healthcare power of attorney.  She says her daughter Tamela Oddi however would be more available and helpful.  She wants to be DNR she says she has that already put into her paperwork.  She said she is almost 32 and has lived a great life.  Consults: none  Family Communication: none  Severity of Illness: The appropriate patient status  for this patient is OBSERVATION. Observation status is judged to be reasonable and necessary in order to provide the required intensity of service to ensure the patient's safety. The patient's presenting symptoms, physical exam findings, and initial radiographic and laboratory data in the context of their medical condition is felt to place them at decreased risk for further clinical deterioration. Furthermore, it is anticipated that the patient will be medically stable for discharge from the hospital within 2 midnights of admission.   Author: Buena Irish, MD 06/22/2023 7:35 PM  For on call review [email or web address].

## 2023-06-22 NOTE — ED Notes (Signed)
 Cm 8 flagged results to marcus m.rn by at

## 2023-06-22 NOTE — ED Notes (Signed)
 CCMD called for cardiac monitoring.

## 2023-06-22 NOTE — ED Notes (Signed)
 Trauma Response Nurse Documentation  Clifford Coudriet is a 89 y.o. female arriving to [facility] ED via EMS  On clopidogrel 75 mg daily. Trauma was activated as a Level 2 based on the following trauma criteria Elderly patients > 65 with head trauma on anti-coagulation (excluding ASA).  Patient cleared for CT by Dr. Rush Landmark. Pt transported to CT with trauma response nurse present to monitor. RN remained with the patient throughout their absence from the department for clinical observation. GCS 15.  History   Past Medical History:  Diagnosis Date   Acute urinary retention 04/17/2018   Heart disease    Hypertension    IBS (irritable bowel syndrome)    Stroke (HCC) 04/17/2018   around 2011   TIA (transient ischemic attack)      Past Surgical History:  Procedure Laterality Date   ABDOMINAL HYSTERECTOMY  1978   BUNIONECTOMY  2006, 1996   LAPAROSCOPIC SALPINGO OOPHERECTOMY  1988     Initial Focused Assessment (If applicable, or please see trauma documentation): Patient A&Ox4, GCS 15, PERR 3 Airway intact, bilateral breath sounds Pulses 2+  CT's Completed:   CT Head and CT C-Spine   Interventions:  IV, labs 500cc NS  Plan for disposition:  Admission to floor   Event Summary: Patient to ED after a fall on Plavix. Imaging was ordered and revealed no traumatic injury. Patient with elevated CK, potential rhabdo. Will be admitted to medical team for management.  Bedside handoff with ED RN Berna Spare.    Jill Side Jejuan Scala  Trauma Response RN  Please call TRN at [phone] for further assistance.

## 2023-06-22 NOTE — ED Provider Notes (Signed)
 [place] EMERGENCY DEPARTMENT AT [facility] Provider Note   CSN: [id number] Arrival date & time: 06/22/23  1244     History  Chief Complaint  Patient presents with   Fall    FOT    Valerie Barker is a 89 y.o. female.  The history is provided by the patient, the EMS personnel, medical records and a caregiver. No language interpreter was used.  Fall This is a new problem. The current episode started 3 to 5 hours ago. The problem occurs rarely. The problem has not changed since onset.Associated symptoms include headaches. Pertinent negatives include no chest pain, no abdominal pain and no shortness of breath. Nothing aggravates the symptoms. Nothing relieves the symptoms. She has tried nothing for the symptoms. The treatment provided no relief.       Home Medications Prior to Admission medications   Medication Sig Start Date End Date Taking? Authorizing Provider  atorvastatin (LIPITOR) 10 MG tablet Take 1 tablet (10 mg total) by mouth daily. 01/24/23   Lennette Bihari, MD  calcium carbonate (OSCAL) 1500 (600 Ca) MG TABS tablet Take 600 mg of elemental calcium by mouth daily.    [provider]  Cholecalciferol (VITAMIN D) 2000 UNITS CAPS Take 2,000 Units by mouth daily.    [provider]  clopidogrel (PLAVIX) 75 MG tablet Take 75 mg by mouth daily.    [provider]  donepezil (ARICEPT) 10 MG tablet Take 10 mg by mouth at bedtime. 10/05/22   [provider]  hydrochlorothiazide (HYDRODIURIL) 12.5 MG tablet Take 1 tablet (12.5 mg total) by mouth daily. 01/24/23   Joylene Grapes, NP  levothyroxine (SYNTHROID) 25 MCG tablet Take 25-50 mcg by mouth See admin instructions. 25 mcg once daily before breakfast Monday-Saturday. 50 mcg on Sundays.    [provider]  Multiple Vitamins-Minerals (ONE-A-DAY WOMENS 50+) TABS Take 1 tablet by mouth daily.    [provider]  polyethylene glycol (MIRALAX / GLYCOLAX) 17 g  packet Take 17 g by mouth daily as needed for mild constipation. 11/18/22   Barnetta Chapel, MD  rOPINIRole (REQUIP) 1 MG tablet Take 2 mg by mouth See admin instructions. 2 mg every afternoon and 2 mg every evening 05/26/18   [provider]  trimethoprim (TRIMPEX) 100 MG tablet Take 100 mg by mouth every morning. 11/24/22   [provider]      Allergies    Ambien [zolpidem], Cymbalta [duloxetine hcl], Desyrel [trazodone], Erythromycin, Remeron [mirtazapine], Flexeril [cyclobenzaprine], and Valium [diazepam]    Review of Systems   Review of Systems  Constitutional:  Negative for chills, fatigue and fever.  HENT:  Negative for congestion.   Eyes:  Negative for visual disturbance.  Respiratory:  Negative for cough, chest tightness, shortness of breath and wheezing.   Cardiovascular:  Negative for chest pain, palpitations and leg swelling.  Gastrointestinal:  Negative for abdominal pain, constipation, diarrhea, nausea and vomiting.  Genitourinary:  Negative for dysuria and flank pain.  Musculoskeletal:  Positive for neck pain. Negative for back pain and neck stiffness.  Skin:  Positive for color change and wound. Negative for rash.  Neurological:  Positive for headaches. Negative for seizures, weakness, light-headedness and numbness.  Psychiatric/Behavioral:  Negative for agitation and confusion.   All other systems reviewed and are negative.   Physical Exam Updated Vital Signs There were no vitals taken for this visit. Physical Exam Vitals and nursing note reviewed.  Constitutional:      General:  She is not in acute distress.    Appearance: She is well-developed. She is not ill-appearing, toxic-appearing or diaphoretic.  HENT:     Head:     Comments: Bruising and redness on left forehead and left face.  Unclear if this is new or not    Nose: No congestion or rhinorrhea.     Mouth/Throat:     Mouth: Mucous membranes are moist.     Pharynx: No oropharyngeal  exudate or posterior oropharyngeal erythema.  Eyes:     Extraocular Movements: Extraocular movements intact.     Conjunctiva/sclera: Conjunctivae normal.     Pupils: Pupils are equal, round, and reactive to light.  Neck:     Comments: In C collar Cardiovascular:     Rate and Rhythm: Normal rate and regular rhythm.     Pulses: Normal pulses.     Heart sounds: No murmur heard. Pulmonary:     Effort: Pulmonary effort is normal. No respiratory distress.     Breath sounds: Normal breath sounds. No wheezing, rhonchi or rales.  Chest:     Chest wall: No tenderness.  Abdominal:     General: Abdomen is flat.     Palpations: Abdomen is soft.     Tenderness: There is no abdominal tenderness. There is no guarding or rebound.  Musculoskeletal:        General: No swelling or tenderness.     Right lower leg: No edema.     Left lower leg: No edema.     Comments: In her knees to both knees and both ankles with abrasions on knees.  Some bruising.  Has intact pulses and has sensation and strength in the feet.  Skin:    General: Skin is warm and dry.     Capillary Refill: Capillary refill takes less than 2 seconds.     Findings: Bruising and erythema present.  Neurological:     General: No focal deficit present.     Mental Status: She is alert.     Sensory: No sensory deficit.     Motor: No weakness.  Psychiatric:        Mood and Affect: Mood normal.     ED Results / Procedures / Treatments   Labs (all labs ordered are listed, but only abnormal results are displayed) Labs Reviewed  COMPREHENSIVE METABOLIC PANEL - Abnormal; Notable for the following components:      Result Value   CO2 19 (*)    Glucose, Bld 112 (*)    Creatinine, Ser 1.20 (*)    AST 52 (*)    GFR, Estimated 41 (*)    Anion gap 16 (*)    All other components within normal limits  CBC - Abnormal; Notable for the following components:   WBC 12.6 (*)    All other components within normal limits  CK TOTAL AND CKMB (NOT  AT [facility]) - Abnormal; Notable for the following components:   Total CK 1,052 (*)    CK, MB 20.1 (*)    All other components within normal limits  I-STAT CHEM 8, ED - Abnormal; Notable for the following components:   Creatinine, Ser 1.10 (*)    Glucose, Bld 110 (*)    Calcium, Ion 0.89 (*)    TCO2 21 (*)    Hemoglobin 11.9 (*)    HCT 35.0 (*)    All other components within normal limits  I-STAT CG4 LACTIC ACID, ED - Abnormal; Notable for the following components:   Lactic  Acid, Venous 3.2 (*)    All other components within normal limits  ETHANOL  PROTIME-INR  URINALYSIS, ROUTINE W REFLEX MICROSCOPIC  SAMPLE TO BLOOD BANK    EKG None  Radiology DG Ankle 2 Views Right Result Date: 06/22/2023 CLINICAL DATA:  Bilateral ankle pain after fall. EXAM: RIGHT ANKLE - 2 VIEW COMPARISON:  None Available. FINDINGS: There is no evidence of fracture, dislocation, or joint effusion. There is no evidence of arthropathy or other focal bone abnormality. Soft tissues are unremarkable. IMPRESSION: Negative. Electronically Signed   By: Lupita Raider M.D.   On: 06/22/2023 15:44   DG Ankle 2 Views Left Result Date: 06/22/2023 CLINICAL DATA:  Bilateral ankle pain after fall. EXAM: LEFT ANKLE - 2 VIEW COMPARISON:  None Available. FINDINGS: There is no evidence of fracture, dislocation, or joint effusion. There is no evidence of arthropathy or other focal bone abnormality. Soft tissues are unremarkable. IMPRESSION: Negative. Electronically Signed   By: Lupita Raider M.D.   On: 06/22/2023 15:43   DG Knee 1-2 Views Right Result Date: 06/22/2023 CLINICAL DATA:  Bilateral knee pain after fall. EXAM: RIGHT KNEE - 1-2 VIEW COMPARISON:  None Available. FINDINGS: No evidence of fracture, dislocation, or joint effusion. No evidence of arthropathy or other focal bone abnormality. Soft tissues are unremarkable. IMPRESSION: Negative. Electronically Signed   By: Lupita Raider M.D.   On: 06/22/2023 15:42   DG Knee 1-2  Views Left Result Date: 06/22/2023 CLINICAL DATA:  Bilateral knee pain after fall. EXAM: LEFT KNEE - 1-2 VIEW COMPARISON:  None Available. FINDINGS: No evidence of fracture, dislocation, or joint effusion. No evidence of arthropathy or other focal bone abnormality. Soft tissues are unremarkable. IMPRESSION: Negative. Electronically Signed   By: Lupita Raider M.D.   On: 06/22/2023 15:41   CT HEAD WO CONTRAST Result Date: 06/22/2023 CLINICAL DATA:  Head trauma, moderate-severe; Polytrauma, blunt. Fall. On Plavix. EXAM: CT HEAD WITHOUT CONTRAST CT CERVICAL SPINE WITHOUT CONTRAST TECHNIQUE: Multidetector CT imaging of the head and cervical spine was performed following the standard protocol without intravenous contrast. Multiplanar CT image reconstructions of the cervical spine were also generated. RADIATION DOSE REDUCTION: This exam was performed according to the departmental dose-optimization program which includes automated exposure control, adjustment of the mA and/or kV according to patient size and/or use of iterative reconstruction technique. COMPARISON:  CT scan head and cervical spine from 05/24/2023. FINDINGS: CT HEAD FINDINGS Brain: No evidence of acute infarction, hemorrhage, hydrocephalus, extra-axial collection or mass lesion/mass effect. There is bilateral periventricular hypodensity, which is non-specific but most likely seen in the settings of microvascular ischemic changes. Moderate in extent. Otherwise normal appearance of brain parenchyma. Ventricles are normal. Cerebral volume is age appropriate. Vascular: No hyperdense vessel or unexpected calcification. Intracranial arteriosclerosis. Skull: Normal. Negative for fracture or focal lesion. Sinuses/Orbits: No acute finding. Other: Visualized mastoid air cells are unremarkable. No mastoid effusion. CT CERVICAL SPINE FINDINGS Alignment: Normal. There is grade 1 anterolisthesis of C3 over C4 and C4 over C5, likely degenerative. There is minimal  retrolisthesis of C5 over C6, also likely degenerative. This examination does not assess for ligamentous injury or stability. Skull base and vertebrae: No acute fracture. No primary bone lesion or focal pathologic process. Soft tissues and spinal canal: No prevertebral fluid or swelling. No visible canal hematoma. Disc levels: Mild multilevel reduced intervertebral disc height. There is vacuum phenomena at C6-7 level. Minimal facet arthropathy and marginal osteophyte formation. Upper chest: There are minimal ground-glass opacities  in the left lung apex, nonspecific but likely sequela of prior infection or inflammation. Other: None. IMPRESSION: 1. No acute intracranial abnormality. 2. No acute osseous injury or traumatic listhesis of the cervical spine. 3. Multilevel degenerative changes of the cervical spine. Electronically Signed   By: Jules Schick M.D.   On: 06/22/2023 14:17   CT CERVICAL SPINE WO CONTRAST Result Date: 06/22/2023 CLINICAL DATA:  Head trauma, moderate-severe; Polytrauma, blunt. Fall. On Plavix. EXAM: CT HEAD WITHOUT CONTRAST CT CERVICAL SPINE WITHOUT CONTRAST TECHNIQUE: Multidetector CT imaging of the head and cervical spine was performed following the standard protocol without intravenous contrast. Multiplanar CT image reconstructions of the cervical spine were also generated. RADIATION DOSE REDUCTION: This exam was performed according to the departmental dose-optimization program which includes automated exposure control, adjustment of the mA and/or kV according to patient size and/or use of iterative reconstruction technique. COMPARISON:  CT scan head and cervical spine from 05/24/2023. FINDINGS: CT HEAD FINDINGS Brain: No evidence of acute infarction, hemorrhage, hydrocephalus, extra-axial collection or mass lesion/mass effect. There is bilateral periventricular hypodensity, which is non-specific but most likely seen in the settings of microvascular ischemic changes. Moderate in extent.  Otherwise normal appearance of brain parenchyma. Ventricles are normal. Cerebral volume is age appropriate. Vascular: No hyperdense vessel or unexpected calcification. Intracranial arteriosclerosis. Skull: Normal. Negative for fracture or focal lesion. Sinuses/Orbits: No acute finding. Other: Visualized mastoid air cells are unremarkable. No mastoid effusion. CT CERVICAL SPINE FINDINGS Alignment: Normal. There is grade 1 anterolisthesis of C3 over C4 and C4 over C5, likely degenerative. There is minimal retrolisthesis of C5 over C6, also likely degenerative. This examination does not assess for ligamentous injury or stability. Skull base and vertebrae: No acute fracture. No primary bone lesion or focal pathologic process. Soft tissues and spinal canal: No prevertebral fluid or swelling. No visible canal hematoma. Disc levels: Mild multilevel reduced intervertebral disc height. There is vacuum phenomena at C6-7 level. Minimal facet arthropathy and marginal osteophyte formation. Upper chest: There are minimal ground-glass opacities in the left lung apex, nonspecific but likely sequela of prior infection or inflammation. Other: None. IMPRESSION: 1. No acute intracranial abnormality. 2. No acute osseous injury or traumatic listhesis of the cervical spine. 3. Multilevel degenerative changes of the cervical spine. Electronically Signed   By: Jules Schick M.D.   On: 06/22/2023 14:17   DG Pelvis Portable Result Date: 06/22/2023 CLINICAL DATA:  Trauma. EXAM: PORTABLE PELVIS 1-2 VIEWS COMPARISON:  11/16/2022. FINDINGS: Pelvis is intact with normal and symmetric sacroiliac joints. No acute fracture or dislocation. No aggressive osseous lesion. Visualized sacral arcuate lines are unremarkable. There are changes of chronic pubic symphisitis. There are mild degenerative changes of bilateral hip joints characterized by mild-to-moderate joint space narrowing (right > left) and osteophytosis of the superior acetabulum. No  radiopaque foreign bodies. IMPRESSION: No acute osseous abnormality of the pelvis. Electronically Signed   By: Jules Schick M.D.   On: 06/22/2023 14:10   DG Chest Port 1 View Result Date: 06/22/2023 CLINICAL DATA:  Trauma.  Fall. EXAM: PORTABLE CHEST 1 VIEW COMPARISON:  03/20/2023. FINDINGS: Bilateral lung fields are clear. Bilateral costophrenic angles are clear. Normal cardio-mediastinal silhouette. No acute osseous abnormalities. The soft tissues are within normal limits. IMPRESSION: No active disease. Electronically Signed   By: Jules Schick M.D.   On: 06/22/2023 14:09    Procedures Procedures    Medications Ordered in ED Medications  sodium chloride 0.9 % bolus 500 mL (has no administration in time range)  ED Course/ Medical Decision Making/ A&P                                 Medical Decision Making Amount and/or Complexity of Data Reviewed Labs: ordered. Radiology: ordered.   Casandra Dallaire is a 89 y.o. female with a past medical history significant for hypertension, previous stroke on Plavix, irritable bowel syndrome, tachybradycardia syndrome, CKD, diastolic heart disease, and hypothyroidism who presents as a level 2 trauma for fall on thinners.  According to patient and EMS, patient was reportedly seen up and awake around 8 AM this morning.  Several hours later she did not answer her phone multiple times by family and caregiver saw her about 1130 and found her laying facedown on the ground.  Is unclear how many hours patient was on the ground for.  Patient does not member exactly what happened but said her feet got tangled up and she fell.  She is denying significant chest pain or shortness of breath.  Denies fevers, chills or cough.  Denies any nausea, vomiting, constipation, diarrhea, or urinary changes.  Caregiver said she has not noticed any recent infectious symptoms but EMS said they were concerned for possible UTI.  Patient was placed in a c-collar as she was  complaining of some mild headache and neck pain and is primarily having pain in both knees and both ankles.  On exam, lungs clear.  Chest nontender, abdomen nontender.  Patient moving all extremities.  Intact sensation strength throughout.  Symmetric smile.  Clear speech.  Patient does have abrasions and bruising on both knees and some tenderness to both ankles.  She has bruising on her face that she says is not new.  She is in a collar.  Otherwise back nontender chest and abdomen nontender.  Patient will get CT of the head and neck and will get x-ray of her chest and pelvis.  Will get x-rays of both knees and both ankles.  Will get some screening labs including CK as we do not know how long she was down for.  If workup reassuring, patient will likely be stable for discharge back to her facility.  Anticipate reassessment after workup.  Collar was removed after CT head and neck were reassuring.  X-rays did not show acute fracture, given the amount of left ankle pain she is having I suspect a sprain.  Will give a cam walker.  Workup began to return.  Patient does have a leukocytosis and has elevated CK.  Suspect a degree of rhabdo and dehydration.  Given the EMS concern for UTI and caregiver saying that she has had UTIs in the past, I am concerned about this.  Patient is amenable to getting in and out catheterization.  Family is concerned about the patient given the rhabdo, possible UTI, fall, and now the ankle pain limiting her mobility.  Anticipate patient may need admission for rehydration.  Will transfer care to Dr. Elayne Snare while she awaits the urinalysis and reassessment.  Care transferred in stable condition.         Final Clinical Impression(s) / ED Diagnoses Final diagnoses:  Fall, initial encounter  Contusion of face, initial encounter  Acute pain of both knees  Acute bilateral ankle pain  Elevated CK      Clinical Impression: 1. Fall, initial encounter   2. Contusion of  face, initial encounter   3. Acute pain of both knees   4. Acute  bilateral ankle pain   5. Elevated CK     Disposition: Care transferred to oncoming team to await urinalysis and reassessment.  Anticipate admission for possible rehydration in the setting of rhabdo and mobility changes with the ankle sprain.  This note was prepared with assistance of Conservation officer, historic buildings. Occasional wrong-word or sound-a-like substitutions may have occurred due to the inherent limitations of voice recognition software.      Jerman Tinnon, Canary Brim, MD 06/22/23 [phone]

## 2023-06-22 NOTE — ED Notes (Signed)
 Lactic acid flagged results to marcus m. Rn by at

## 2023-06-22 NOTE — Progress Notes (Signed)
 Orthopedic Tech Progress Note Patient Details:  Valerie Barker 05-25-1922 [id number]  Ortho Devices Type of Ortho Device: CAM walker Ortho Device/Splint Location: LLE Ortho Device/Splint Interventions: Application, Adjustment   Post Interventions Patient Tolerated: Fair Instructions Provided: Adjustment of device  Danija Gosa E Kitiara Hintze 06/22/2023, 4:33 PM

## 2023-06-22 NOTE — ED Provider Notes (Signed)
  Physical Exam  BP (!) 108/48 (BP Location: Right Arm)   Pulse 67   Temp 98.3 F (36.8 C)   Resp 20   Ht 4\' 11"  (1.499 m)   Wt 55 kg   SpO2 100%   BMI 24.49 kg/m   Physical Exam Vitals and nursing note reviewed.  HENT:     Head: Normocephalic and atraumatic.  Eyes:     Pupils: Pupils are equal, round, and reactive to light.  Cardiovascular:     Rate and Rhythm: Normal rate and regular rhythm.  Pulmonary:     Effort: Pulmonary effort is normal.     Breath sounds: Normal breath sounds.  Abdominal:     Palpations: Abdomen is soft.     Tenderness: There is no abdominal tenderness.  Skin:    General: Skin is warm and dry.  Neurological:     Mental Status: She is alert.  Psychiatric:        Mood and Affect: Mood normal.     Procedures  Procedures  ED Course / MDM   Clinical Course as of 06/22/23 2350  Thu Jun 22, 2023  1808 UA shows no UTI.  Patient still remains quite weak with pain in both feet.  Given elevation in CK will admit to medicine given concern for potential rhabdo with prolonged downtime.  Discussed with admitting hospitalist who accepts patient for admission [MP]    Clinical Course User Index [MP] Royanne Foots, DO   Medical Decision Making I, Estelle June DO, have assumed care of this patient from the previous provider pending urinalysis, reevaluation and disposition  Amount and/or Complexity of Data Reviewed Labs: ordered. Radiology: ordered.  Risk Decision regarding hospitalization.          Royanne Foots, DO 06/22/23 2350

## 2023-06-23 DIAGNOSIS — Z7401 Bed confinement status: Secondary | ICD-10-CM | POA: Diagnosis not present

## 2023-06-23 DIAGNOSIS — R54 Age-related physical debility: Secondary | ICD-10-CM | POA: Diagnosis present

## 2023-06-23 DIAGNOSIS — I2510 Atherosclerotic heart disease of native coronary artery without angina pectoris: Secondary | ICD-10-CM | POA: Diagnosis present

## 2023-06-23 DIAGNOSIS — K581 Irritable bowel syndrome with constipation: Secondary | ICD-10-CM | POA: Diagnosis present

## 2023-06-23 DIAGNOSIS — I10 Essential (primary) hypertension: Secondary | ICD-10-CM | POA: Diagnosis not present

## 2023-06-23 DIAGNOSIS — Z818 Family history of other mental and behavioral disorders: Secondary | ICD-10-CM | POA: Diagnosis not present

## 2023-06-23 DIAGNOSIS — R55 Syncope and collapse: Secondary | ICD-10-CM | POA: Diagnosis not present

## 2023-06-23 DIAGNOSIS — Z66 Do not resuscitate: Secondary | ICD-10-CM | POA: Diagnosis present

## 2023-06-23 DIAGNOSIS — N1831 Chronic kidney disease, stage 3a: Secondary | ICD-10-CM | POA: Diagnosis present

## 2023-06-23 DIAGNOSIS — Z79899 Other long term (current) drug therapy: Secondary | ICD-10-CM | POA: Diagnosis not present

## 2023-06-23 DIAGNOSIS — Z79890 Hormone replacement therapy (postmenopausal): Secondary | ICD-10-CM | POA: Diagnosis not present

## 2023-06-23 DIAGNOSIS — M6281 Muscle weakness (generalized): Secondary | ICD-10-CM | POA: Diagnosis not present

## 2023-06-23 DIAGNOSIS — I495 Sick sinus syndrome: Secondary | ICD-10-CM | POA: Diagnosis not present

## 2023-06-23 DIAGNOSIS — Z888 Allergy status to other drugs, medicaments and biological substances status: Secondary | ICD-10-CM | POA: Diagnosis not present

## 2023-06-23 DIAGNOSIS — Z881 Allergy status to other antibiotic agents status: Secondary | ICD-10-CM | POA: Diagnosis not present

## 2023-06-23 DIAGNOSIS — Z825 Family history of asthma and other chronic lower respiratory diseases: Secondary | ICD-10-CM | POA: Diagnosis not present

## 2023-06-23 DIAGNOSIS — I5032 Chronic diastolic (congestive) heart failure: Secondary | ICD-10-CM | POA: Diagnosis present

## 2023-06-23 DIAGNOSIS — R001 Bradycardia, unspecified: Secondary | ICD-10-CM | POA: Diagnosis not present

## 2023-06-23 DIAGNOSIS — E785 Hyperlipidemia, unspecified: Secondary | ICD-10-CM | POA: Diagnosis not present

## 2023-06-23 DIAGNOSIS — R296 Repeated falls: Secondary | ICD-10-CM | POA: Diagnosis present

## 2023-06-23 DIAGNOSIS — I5030 Unspecified diastolic (congestive) heart failure: Secondary | ICD-10-CM | POA: Diagnosis not present

## 2023-06-23 DIAGNOSIS — I130 Hypertensive heart and chronic kidney disease with heart failure and stage 1 through stage 4 chronic kidney disease, or unspecified chronic kidney disease: Secondary | ICD-10-CM | POA: Diagnosis present

## 2023-06-23 DIAGNOSIS — R531 Weakness: Secondary | ICD-10-CM | POA: Diagnosis not present

## 2023-06-23 DIAGNOSIS — R278 Other lack of coordination: Secondary | ICD-10-CM | POA: Diagnosis not present

## 2023-06-23 DIAGNOSIS — W1839XA Other fall on same level, initial encounter: Secondary | ICD-10-CM | POA: Diagnosis present

## 2023-06-23 DIAGNOSIS — Y92008 Other place in unspecified non-institutional (private) residence as the place of occurrence of the external cause: Secondary | ICD-10-CM | POA: Diagnosis not present

## 2023-06-23 DIAGNOSIS — R2681 Unsteadiness on feet: Secondary | ICD-10-CM | POA: Diagnosis not present

## 2023-06-23 DIAGNOSIS — Z90710 Acquired absence of both cervix and uterus: Secondary | ICD-10-CM | POA: Diagnosis not present

## 2023-06-23 DIAGNOSIS — M6282 Rhabdomyolysis: Secondary | ICD-10-CM

## 2023-06-23 DIAGNOSIS — E860 Dehydration: Secondary | ICD-10-CM | POA: Diagnosis present

## 2023-06-23 DIAGNOSIS — K589 Irritable bowel syndrome without diarrhea: Secondary | ICD-10-CM | POA: Diagnosis not present

## 2023-06-23 DIAGNOSIS — G629 Polyneuropathy, unspecified: Secondary | ICD-10-CM | POA: Diagnosis present

## 2023-06-23 DIAGNOSIS — E039 Hypothyroidism, unspecified: Secondary | ICD-10-CM | POA: Diagnosis not present

## 2023-06-23 DIAGNOSIS — Z7902 Long term (current) use of antithrombotics/antiplatelets: Secondary | ICD-10-CM | POA: Diagnosis not present

## 2023-06-23 DIAGNOSIS — R2689 Other abnormalities of gait and mobility: Secondary | ICD-10-CM | POA: Diagnosis not present

## 2023-06-23 DIAGNOSIS — Z8673 Personal history of transient ischemic attack (TIA), and cerebral infarction without residual deficits: Secondary | ICD-10-CM | POA: Diagnosis not present

## 2023-06-23 DIAGNOSIS — N189 Chronic kidney disease, unspecified: Secondary | ICD-10-CM | POA: Diagnosis not present

## 2023-06-23 DIAGNOSIS — Z743 Need for continuous supervision: Secondary | ICD-10-CM | POA: Diagnosis not present

## 2023-06-23 DIAGNOSIS — G2581 Restless legs syndrome: Secondary | ICD-10-CM | POA: Diagnosis present

## 2023-06-23 DIAGNOSIS — Z823 Family history of stroke: Secondary | ICD-10-CM | POA: Diagnosis not present

## 2023-06-23 DIAGNOSIS — S0083XA Contusion of other part of head, initial encounter: Secondary | ICD-10-CM | POA: Diagnosis present

## 2023-06-23 LAB — CBC WITH DIFFERENTIAL/PLATELET
Abs Immature Granulocytes: 0.01 10*3/uL (ref 0.00–0.07)
Basophils Absolute: 0 10*3/uL (ref 0.0–0.1)
Basophils Relative: 0 %
Eosinophils Absolute: 0.3 10*3/uL (ref 0.0–0.5)
Eosinophils Relative: 4 %
HCT: 33.3 % — ABNORMAL LOW (ref 36.0–46.0)
Hemoglobin: 11.2 g/dL — ABNORMAL LOW (ref 12.0–15.0)
Immature Granulocytes: 0 %
Lymphocytes Relative: 19 %
Lymphs Abs: 1.3 10*3/uL (ref 0.7–4.0)
MCH: 31.1 pg (ref 26.0–34.0)
MCHC: 33.6 g/dL (ref 30.0–36.0)
MCV: 92.5 fL (ref 80.0–100.0)
Monocytes Absolute: 0.8 10*3/uL (ref 0.1–1.0)
Monocytes Relative: 11 %
Neutro Abs: 4.5 10*3/uL (ref 1.7–7.7)
Neutrophils Relative %: 66 %
Platelets: 173 10*3/uL (ref 150–400)
RBC: 3.6 MIL/uL — ABNORMAL LOW (ref 3.87–5.11)
RDW: 13.7 % (ref 11.5–15.5)
WBC: 6.9 10*3/uL (ref 4.0–10.5)
nRBC: 0 % (ref 0.0–0.2)

## 2023-06-23 LAB — COMPREHENSIVE METABOLIC PANEL
ALT: 43 U/L (ref 0–44)
AST: 113 U/L — ABNORMAL HIGH (ref 15–41)
Albumin: 3.2 g/dL — ABNORMAL LOW (ref 3.5–5.0)
Alkaline Phosphatase: 44 U/L (ref 38–126)
Anion gap: 10 (ref 5–15)
BUN: 20 mg/dL (ref 8–23)
CO2: 21 mmol/L — ABNORMAL LOW (ref 22–32)
Calcium: 8.8 mg/dL — ABNORMAL LOW (ref 8.9–10.3)
Chloride: 103 mmol/L (ref 98–111)
Creatinine, Ser: 1.02 mg/dL — ABNORMAL HIGH (ref 0.44–1.00)
GFR, Estimated: 50 mL/min — ABNORMAL LOW (ref >60)
Glucose, Bld: 170 mg/dL — ABNORMAL HIGH (ref 70–99)
Potassium: 4.4 mmol/L (ref 3.5–5.1)
Sodium: 134 mmol/L — ABNORMAL LOW (ref 135–145)
Total Bilirubin: 0.8 mg/dL (ref 0.0–1.2)
Total Protein: 5.4 g/dL — ABNORMAL LOW (ref 6.5–8.1)

## 2023-06-23 LAB — CK
Total CK: 4661 U/L — ABNORMAL HIGH (ref 38–234)
Total CK: 3616 U/L — ABNORMAL HIGH (ref 38–234)

## 2023-06-23 LAB — CBC
HCT: 35.5 % — ABNORMAL LOW (ref 36.0–46.0)
Hemoglobin: 12 g/dL (ref 12.0–15.0)
MCH: 30.9 pg (ref 26.0–34.0)
MCHC: 33.8 g/dL (ref 30.0–36.0)
MCV: 91.5 fL (ref 80.0–100.0)
Platelets: 171 10*3/uL (ref 150–400)
RBC: 3.88 MIL/uL (ref 3.87–5.11)
RDW: 13.8 % (ref 11.5–15.5)
WBC: 9.1 10*3/uL (ref 4.0–10.5)
nRBC: 0 % (ref 0.0–0.2)

## 2023-06-23 LAB — LACTIC ACID, PLASMA: Lactic Acid, Venous: 1.2 mmol/L (ref 0.5–1.9)

## 2023-06-23 LAB — MAGNESIUM: Magnesium: 1.6 mg/dL — ABNORMAL LOW (ref 1.7–2.4)

## 2023-06-23 LAB — TSH: TSH: 5.468 u[IU]/mL — ABNORMAL HIGH (ref 0.350–4.500)

## 2023-06-23 MED ORDER — LACTATED RINGERS IV SOLN: INTRAVENOUS | Status: DC

## 2023-06-23 MED ORDER — MAGNESIUM SULFATE 2 GM/50ML IV SOLN
2.0000 g | Freq: Once | INTRAVENOUS | Status: AC
  Administered 2023-06-23: 2 g via INTRAVENOUS
  Filled 2023-06-23: qty 50

## 2023-06-23 NOTE — Plan of Care (Signed)

## 2023-06-23 NOTE — Plan of Care (Signed)
  Problem: Education: Goal: Knowledge of General Education information will improve Description: Including pain rating scale, medication(s)/side effects and non-pharmacologic comfort measures Outcome: Progressing   Problem: Health Behavior/Discharge Planning: Goal: Ability to manage health-related needs will improve Outcome: Progressing   Problem: Activity: Goal: Risk for activity intolerance will decrease Outcome: Progressing   Problem: Coping: Goal: Level of anxiety will decrease Outcome: Progressing   Problem: Nutrition: Goal: Adequate nutrition will be maintained Outcome: Progressing   Problem: Elimination: Goal: Will not experience complications related to bowel motility Outcome: Progressing Goal: Will not experience complications related to urinary retention Outcome: Progressing   Problem: Pain Managment: Goal: General experience of comfort will improve and/or be controlled Outcome: Progressing   Problem: Safety: Goal: Ability to remain free from injury will improve Outcome: Progressing   Problem: Skin Integrity: Goal: Risk for impaired skin integrity will decrease Outcome: Progressing

## 2023-06-23 NOTE — Progress Notes (Signed)
 Pt arrived to the unit  via stretcher. Pt wearing Cam boot on LLE. Pt c/o pain in L ankle and back when moved. Skin is red and blanchable within gluteal cleft. Pt has large bony spinous process protruding that is pink but blanchable. Mepilex applied to sacral area  and back. Pt does have abdominal hernia when coughing. Pt c/o constipation stating "It is a regular issue for me". Pt received prn dulcolax in ED. External catheter placed and pt is DTV. Pt unable to state when last BM was.  Pt is HOH and does not have hearing aids. Pt is wearing elbow sleeves with pads "for my falls". L eye is bruised and swollen as is left upper lip. Pt states she hit a tooth but no obvious damage noted.  IVF infusing. Pt encouraged to drink. Pt requested chicken broth and ice water. Pt provided with both. Falls precautions in place. Pt educated on calling for assistance. Pt verbalized understanding.

## 2023-06-23 NOTE — Plan of Care (Signed)

## 2023-06-23 NOTE — Progress Notes (Signed)
 [facility] Progress Note Patient: Valerie Barker ZOX:[id number] DOB: 1922/08/11 DOA: 06/22/2023  DOS: the patient was seen and examined on 06/23/2023  Brief Hospital Course: PMH of HTN, IBS, CVA, hypothyroidism, chronic constipation present to the hospital with complaints of fall. Fall is unwitnessed.  Patient reports that this is secondary to chronic numbness in bilateral feet. Found to have rhabdomyolysis.  Currently being treated. Assessment and Plan: Unwitnessed fall. Etiology not clear. Patient reports that she has chronic numbness and does not have an obstruction. Patient has 24/7 caregivers. No other focal deficits on my evaluation. No evidence of syncope.  CT head and C-spine unremarkable. Echocardiogram in August 2024 as well as long-term Holter monitor in September also unremarkable. PT OT consulted.  Will monitor.  Nontraumatic rhabdomyolysis. CK level was 1000 other, while patient. Worsened to 4000.  Now trending down to 3000. Continue aggressive hydration and monitor.  Concern for UTI. Recurrent UTI. Currently does not have antibiotic potential and less likely UTI. Will monitor.  Continue trimethoprim.  Chronic neuropathy. Per patient that is the cause of her fall. Recheck labs in the morning for vitamin. Outpatient follow-up with neurology recommended.  Chronic constipation. For now we will monitor but  CKD stage IIIa. Baseline serum creatinine around 1.1. Currently improving to that baseline level for Monitor.  Elevated AST. In the setting of CK elevation. For now we will monitor.  Lactic acid elevation. Likely from dehydration. Treated with hydration. Monitor.  Hypothyroidism.  Send continuing Synthroid.  HTN. Hydrochlorothiazide on hold for  CAD. On Plavix. Monitor for now.  Restless leg syndrome. On Requip. Continue.   Principal Problem:   Rhabdomyolysis Active Problems:   Diastolic heart failure with preserved EF 60-65  (HCC)   Essential hypertension   Hypothyroidism      Subjective: No nausea no vomiting no fever no chills.  Unchanged chronic numbness.  Physical Exam: General: in Mild distress, No Rash Cardiovascular: S1 and S2 Present, No Murmur Respiratory: Good respiratory effort, Bilateral Air entry present. No Crackles, No wheezes Abdomen: Bowel Sound present, No tenderness Extremities: No edema glovelike numbness involving bilateral feet. Neuro: Alert and oriented x3, no new focal deficit  Data Reviewed: I have Reviewed nursing notes, Vitals, and Lab results. Since last encounter, pertinent lab results CBC and BMP   . I have ordered test including CBC BMP lactic acid, B12 and folic acid.  Marland Kitchen   Disposition: Status is: Inpatient Remains inpatient appropriate because: Monitor for improvement in CK level.  heparin injection 5,000 Units Start: 06/23/23 0600   Family Communication: Caregiver was at bedside. Level of care: Med-Surg   Vitals:   06/22/23 2248 06/23/23 0814 06/23/23 1308 06/23/23 1503  BP: (!) 108/48 125/61 (!) 126/52 (!) 126/55  Pulse: 67 85 82 63  Resp: 20 18 15 18   Temp: 98.3 F (36.8 C) (!) 97.5 F (36.4 C) 97.9 F (36.6 C) 97.6 F (36.4 C)  TempSrc: Oral  Oral   SpO2: 100% 96% 95% 94%  Weight:      Height:         Author: Lynden Oxford, MD 06/23/2023 7:45 PM  Please look on [email or web address] to find out who is on call.

## 2023-06-24 ENCOUNTER — Other Ambulatory Visit: Payer: Self-pay | Admitting: Cardiovascular Disease

## 2023-06-24 DIAGNOSIS — M6282 Rhabdomyolysis: Secondary | ICD-10-CM | POA: Diagnosis not present

## 2023-06-24 LAB — CALCIUM, IONIZED: Calcium, Ionized, Serum: 5 mg/dL (ref 4.5–5.6)

## 2023-06-24 LAB — BASIC METABOLIC PANEL
Anion gap: 11 (ref 5–15)
BUN: 14 mg/dL (ref 8–23)
CO2: 22 mmol/L (ref 22–32)
Calcium: 8.7 mg/dL — ABNORMAL LOW (ref 8.9–10.3)
Chloride: 107 mmol/L (ref 98–111)
Creatinine, Ser: 0.98 mg/dL (ref 0.44–1.00)
GFR, Estimated: 52 mL/min — ABNORMAL LOW (ref >60)
Glucose, Bld: 94 mg/dL (ref 70–99)
Potassium: 3.7 mmol/L (ref 3.5–5.1)
Sodium: 140 mmol/L (ref 135–145)

## 2023-06-24 LAB — CBC
HCT: 36.9 % (ref 36.0–46.0)
Hemoglobin: 12.1 g/dL (ref 12.0–15.0)
MCH: 30.7 pg (ref 26.0–34.0)
MCHC: 32.8 g/dL (ref 30.0–36.0)
MCV: 93.7 fL (ref 80.0–100.0)
Platelets: 149 10*3/uL — ABNORMAL LOW (ref 150–400)
RBC: 3.94 MIL/uL (ref 3.87–5.11)
RDW: 13.5 % (ref 11.5–15.5)
WBC: 6.5 10*3/uL (ref 4.0–10.5)
nRBC: 0 % (ref 0.0–0.2)

## 2023-06-24 LAB — CK
Total CK: 2435 U/L — ABNORMAL HIGH (ref 38–234)
Total CK: 2107 U/L — ABNORMAL HIGH (ref 38–234)

## 2023-06-24 LAB — FOLATE: Folate: 29 ng/mL (ref >5.9)

## 2023-06-24 LAB — VITAMIN B12: Vitamin B-12: 1511 pg/mL — ABNORMAL HIGH (ref 180–914)

## 2023-06-24 LAB — MAGNESIUM: Magnesium: 2 mg/dL (ref 1.7–2.4)

## 2023-06-24 NOTE — Evaluation (Signed)
 Physical Therapy Evaluation Patient Details Name: Valerie Barker MRN: [id number] DOB: 1922/09/06 Today's Date: 06/24/2023  History of Present Illness  Pt is 89 yo female PMH of HTN, IBS, CVA, hypothyroidism, chronic constipation present to the hospital with complaints of fall.  Clinical Impression  Pt presents with admitting diagnosis above. Co-treat with OT. Pt today was able to stand multiple times from recliner with +2 Min A however was unable to take steps forward without losing balance and fallign backwards into chair. Pt reports a very high fear of falling. PTA pt required at least 1 person assist for all mobility and ADLs per OT conservation with son. Patient will benefit from continued inpatient follow up therapy, <3 hours/day. PT will continue to follow.         If plan is discharge home, recommend the following: Two people to help with walking and/or transfers;A lot of help with bathing/dressing/bathroom;Assistance with cooking/housework;Direct supervision/assist for medications management;Assist for transportation;Help with stairs or ramp for entrance   Can travel by private vehicle   No    Equipment Recommendations Rolling walker (2 wheels);Wheelchair (measurements PT);Wheelchair cushion (measurements PT);BSC/3in1  Recommendations for Other Services       Functional Status Assessment Patient has had a recent decline in their functional status and demonstrates the ability to make significant improvements in function in a reasonable and predictable amount of time.     Precautions / Restrictions Precautions Precautions: Fall      Mobility  Bed Mobility               General bed mobility comments: up in chair. per nursing she needed +2 assist this morning.    Transfers Overall transfer level: Needs assistance Equipment used: Rolling walker (2 wheels), Rollator (4 wheels) Transfers: Sit to/from Stand Sit to Stand: Min assist, +2 physical assistance            General transfer comment: Able to stand from recliner with min A +2. Multiple trials to take a step were unsuccessful; trialed with walker and then rollator. Uncontrolled descent each attempt at walking.    Ambulation/Gait               General Gait Details: Unable to take steps without falling back into chair  Stairs            Wheelchair Mobility     Tilt Bed    Modified Rankin (Stroke Patients Only)       Balance Overall balance assessment: Needs assistance, History of Falls Sitting-balance support: Bilateral upper extremity supported, Feet supported Sitting balance-Leahy Scale: Fair     Standing balance support: Bilateral upper extremity supported, During functional activity, Reliant on assistive device for balance Standing balance-Leahy Scale: Poor Standing balance comment: poor static standing balance, zero dynamic standing balance                             Pertinent Vitals/Pain Pain Assessment Pain Assessment: Faces Faces Pain Scale: Hurts a little bit Pain Location: unspecified Pain Descriptors / Indicators: Discomfort    Home Living Family/patient expects to be discharged to:: Skilled nursing facility                   Additional Comments: lives in apartment at [facility]    Prior Function Prior Level of Function : Needs assist       Physical Assist : ADLs (physical)   ADLs (physical): IADLs Mobility Comments: walks with +  1 assist ADLs Comments: Personal aide 8am-6pm     Extremity/Trunk Assessment   Upper Extremity Assessment Upper Extremity Assessment: Generalized weakness    Lower Extremity Assessment Lower Extremity Assessment: Generalized weakness    Cervical / Trunk Assessment Cervical / Trunk Assessment: Kyphotic  Communication   Communication Communication: Impaired Factors Affecting Communication: Hearing impaired    Cognition Arousal: Alert Behavior During Therapy: WFL for tasks  assessed/performed, Anxious (Pt endorsed anxiety in standing position, fear of falling.)                             Following commands: Intact       Cueing Cueing Techniques: Verbal cues     General Comments General comments (skin integrity, edema, etc.): VSS. OT spoke with family over phone.    Exercises     Assessment/Plan    PT Assessment Patient needs continued PT services  PT Problem List Decreased strength;Decreased range of motion;Decreased activity tolerance;Decreased balance;Decreased mobility;Decreased coordination;Decreased knowledge of use of DME;Decreased safety awareness;Decreased knowledge of precautions;Cardiopulmonary status limiting activity       PT Treatment Interventions DME instruction;Gait training;Stair training;Functional mobility training;Therapeutic activities;Therapeutic exercise;Neuromuscular re-education;Balance training;Patient/family education    PT Goals (Current goals can be found in the Care Plan section)  Acute Rehab PT Goals Patient Stated Goal: to go home PT Goal Formulation: With patient Time For Goal Achievement: 07/08/23 Potential to Achieve Goals: Fair    Frequency Min 2X/week     Co-evaluation PT/OT/SLP Co-Evaluation/Treatment: Yes Reason for Co-Treatment: For patient/therapist safety;To address functional/ADL transfers PT goals addressed during session: Mobility/safety with mobility;Balance;Proper use of DME OT goals addressed during session: ADL's and self-care;Proper use of Adaptive equipment and DME       AM-PAC PT "6 Clicks" Mobility  Outcome Measure Help needed turning from your back to your side while in a flat bed without using bedrails?: A Little Help needed moving from lying on your back to sitting on the side of a flat bed without using bedrails?: A Little Help needed moving to and from a bed to a chair (including a wheelchair)?: A Lot Help needed standing up from a chair using your arms (e.g.,  wheelchair or bedside chair)?: A Lot Help needed to walk in hospital room?: Total Help needed climbing 3-5 steps with a railing? : Total 6 Click Score: 12    End of Session Equipment Utilized During Treatment: Gait belt Activity Tolerance: Patient limited by fatigue Patient left: in chair;with call bell/phone within reach;with chair alarm set Nurse Communication: Mobility status PT Visit Diagnosis: Other abnormalities of gait and mobility (R26.89)    Time: 1137-1207 PT Time Calculation (min) (ACUTE ONLY): 30 min   Charges:   PT Evaluation $PT Eval Moderate Complexity: 1 Mod   PT General Charges $$ ACUTE PT VISIT: 1 Visit         Shela Nevin, PT, DPT Acute Rehab Services [phone]   Gladys Damme 06/24/2023, 4:35 PM

## 2023-06-24 NOTE — Progress Notes (Signed)
 [facility] Progress Note Patient: Valerie Barker UVO:[id number] DOB: 03-Jul-1922 DOA: 06/22/2023  DOS: the patient was seen and examined on 06/24/2023  Brief Hospital Course: PMH of HTN, IBS, CVA, hypothyroidism, chronic constipation present to the hospital with complaints of fall. Fall is unwitnessed.  Patient reports that this is secondary to chronic numbness in bilateral feet. Found to have rhabdomyolysis.  Given IV fluid.  Assessment and Plan: Unwitnessed fall. Etiology not clear. Patient reports that she has chronic numbness and does not have an obstruction. Patient has 24/7 caregivers. No other focal deficits on my evaluation. No evidence of syncope.  CT head and C-spine unremarkable. Echocardiogram in August 2024 as well as long-term Holter monitor in September also unremarkable. PT OT consulted.  Will monitor.  Nontraumatic rhabdomyolysis. CK level was 1000 other, while patient. Worsened to 4000.  Now trending down to 3000. Treated with aggressive IV hydration. Now appears to have some mild volume overload therefore I will be holding hydration and monitor CK level.  Concern for UTI. Recurrent UTI. Currently does not have antibiotic potential and less likely UTI. Will monitor.  Continue trimethoprim.  Chronic neuropathy. Per patient that is the cause of her fall. B12 was severely low in the past currently normal.  Folic acid also normal. PT OT recommending SNF. Outpatient follow-up with neurology recommended.  Chronic constipation. For now we will monitor but  CKD stage IIIa. Baseline serum creatinine around 1.1. Currently improving to that baseline level for Monitor.  Elevated AST. In the setting of CK elevation. For now we will monitor.  Lactic acid elevation. Likely from dehydration. Treated with hydration. Monitor.  Hypothyroidism.  Send continuing Synthroid.  HTN. Hydrochlorothiazide on hold for  CAD. On Plavix. Monitor for  now.  Restless leg syndrome. On Requip. Continue.    Subjective: No acute complaint.  No nausea no vomiting no fever no chills.  Reports left hip continued pain with improving.  Physical Exam: S1 and S2 present. Basal crackles. Bowel sound present. Trace edema.  Data Reviewed: I have Reviewed nursing notes, Vitals, and Lab results. Reviewed CBC and BMP.  Reordered CBC BMP CK and B12 and folic acid.  Disposition: Status is: Inpatient Remains inpatient appropriate because: Await placement.  heparin injection 5,000 Units Start: 06/23/23 0600   Family Communication: Caregiver was at bedside. Level of care: Med-Surg   Vitals:   06/23/23 1956 06/24/23 0435 06/24/23 0810 06/24/23 1700  BP: (!) 128/59 131/87 (!) 148/63 (!) 148/81  Pulse: 67 62 65 64  Resp: 18 15 17 16   Temp: 98 F (36.7 C) 97.7 F (36.5 C)    TempSrc: Oral Oral    SpO2: 98% 97%  98%  Weight:      Height:         Author: Lynden Oxford, MD 06/24/2023 5:18 PM  Please look on [email or web address] to find out who is on call.

## 2023-06-24 NOTE — Progress Notes (Addendum)
 OT Cancellation Note  Patient Details Name: Valerie Barker MRN: [id number] DOB: 1922-08-29   Cancelled Treatment:    Reason Eval/Treat Not Completed: Other (comment). Pt just got up to chair with assist of aide and nurse and is about to eat breakfast. Plan to reattempt at a later time.   Raynald Kemp, OT Acute Rehabilitation Services Office: [phone]   Pilar Grammes 06/24/2023, 9:57 AM

## 2023-06-24 NOTE — Evaluation (Addendum)
 Occupational Therapy Evaluation Patient Details Name: Valerie Barker MRN: [id number] DOB: 08/12/1922 Today's Date: 06/24/2023   History of Present Illness   Pt is 89 yo female PMH of HTN, IBS, CVA, hypothyroidism, chronic constipation present to the hospital with complaints of fall.     Clinical Impressions Pt admitted with the above diagnoses and presents with below problem list. Pt will benefit from continued acute OT to address the below listed deficits and maximize independence with basic ADLs prior to d/c. Per pt and conversation with son Valerie Barker via phone, pt needed assist of one person for ADLs and functional mobility PTA. Today pt, needs +2 assist with LB ADLs and sit<>stand transfers. Pt was able to take steps to access recliner from bed earlier this morning with assist of 2 people. However, during OT/PT session she was unable to take a singular step without uncontrolled descent into chair. Pt may benefit from [facility] for in/out of bed transfers. Recommend a higher level of available assistance at d/c.       If plan is discharge home, recommend the following:   Two people to help with walking and/or transfers;Two people to help with bathing/dressing/bathroom     Functional Status Assessment   Patient has had a recent decline in their functional status and demonstrates the ability to make significant improvements in function in a reasonable and predictable amount of time.     Equipment Recommendations   Other (comment) (defer to next venue)     Recommendations for Other Services         Precautions/Restrictions   Precautions Precautions: Fall     Mobility Bed Mobility               General bed mobility comments: up in chair. per nursing she needed +2 assist this morning.    Transfers Overall transfer level: Needs assistance Equipment used: Rolling walker (2 wheels), Rollator (4 wheels) Transfers: Sit to/from Stand Sit to Stand: Min  assist, +2 physical assistance           General transfer comment: Able to stand from recliner with min A +2. Multiple trials to take a step were unsuccessful; trialed with walker and then rollator. Uncontrolled descent each attempt at walking.      Balance Overall balance assessment: Needs assistance, History of Falls Sitting-balance support: Bilateral upper extremity supported, Feet supported Sitting balance-Leahy Scale: Fair     Standing balance support: Bilateral upper extremity supported, During functional activity, Reliant on assistive device for balance Standing balance-Leahy Scale: Poor Standing balance comment: poor static standing balance, zero dynamic standing balance                           ADL either performed or assessed with clinical judgement   ADL Overall ADL's : Needs assistance/impaired Eating/Feeding: Set up;Sitting   Grooming: Minimal assistance;Sitting   Upper Body Bathing: Moderate assistance;Sitting   Lower Body Bathing: Moderate assistance;+2 for physical assistance;Sit to/from stand   Upper Body Dressing : Set up;Sitting   Lower Body Dressing: Moderate assistance;+2 for physical assistance;Sit to/from stand                 General ADL Comments: Pt able to stand with min assist +2, unable to walk away from chair d/t uncontrolled descent with attempts at taking a step. Trialed with rolling walker and rollator.     Vision Baseline Vision/History: 1 Wears glasses       Perception  Praxis         Pertinent Vitals/Pain Pain Assessment Pain Assessment: Faces Faces Pain Scale: Hurts a little bit Pain Location: unspecified Pain Descriptors / Indicators: Discomfort Pain Intervention(s): Monitored during session     Extremity/Trunk Assessment Upper Extremity Assessment Upper Extremity Assessment: Generalized weakness   Lower Extremity Assessment Lower Extremity Assessment: Defer to PT evaluation   Cervical /  Trunk Assessment Cervical / Trunk Assessment: Kyphotic   Communication Communication Communication: Impaired Factors Affecting Communication: Hearing impaired   Cognition Arousal: Alert Behavior During Therapy: WFL for tasks assessed/performed, Anxious (Pt endorsed anxiety in standing position, fear of falling.) Cognition: No apparent impairments             OT - Cognition Comments: some slow processing                 Following commands: Intact       Cueing  General Comments   Cueing Techniques: Verbal cues  Spoke with son Valerie Barker via phone to update on pt's current mobility status and considerations with d/c planning.   Exercises     Shoulder Instructions      Home Living Family/patient expects to be discharged to:: Skilled nursing facility                                 Additional Comments: lives in apartment at [facility]      Prior Functioning/Environment Prior Level of Function : Needs assist       Physical Assist : ADLs (physical)   ADLs (physical): IADLs Mobility Comments: walks with +1 assist ADLs Comments: Personal aide 8am-6pm    OT Problem List: Decreased strength;Decreased activity tolerance;Impaired balance (sitting and/or standing);Decreased knowledge of precautions;Decreased knowledge of use of DME or AE;Pain   OT Treatment/Interventions: Self-care/ADL training;Therapeutic exercise;DME and/or AE instruction;Balance training;Patient/family education;Therapeutic activities      OT Goals(Current goals can be found in the care plan section)   Acute Rehab OT Goals Patient Stated Goal: back to her apartment OT Goal Formulation: With patient/family Time For Goal Achievement: 07/08/23 Potential to Achieve Goals: Fair   OT Frequency:  Min 2X/week    Co-evaluation PT/OT/SLP Co-Evaluation/Treatment: Yes Reason for Co-Treatment: For patient/therapist safety;To address functional/ADL transfers PT goals addressed  during session: Mobility/safety with mobility;Balance;Proper use of DME OT goals addressed during session: ADL's and self-care;Proper use of Adaptive equipment and DME      AM-PAC OT "6 Clicks" Daily Activity     Outcome Measure Help from another person eating meals?: A Little Help from another person taking care of personal grooming?: A Little Help from another person toileting, which includes using toliet, bedpan, or urinal?: Total Help from another person bathing (including washing, rinsing, drying)?: Total Help from another person to put on and taking off regular upper body clothing?: A Lot Help from another person to put on and taking off regular lower body clothing?: Total 6 Click Score: 11   End of Session Equipment Utilized During Treatment: Rolling walker (2 wheels);Rollator (4 wheels) Nurse Communication: Mobility status;Need for lift equipment;Other (comment) (may need Stedy for getting her back to bed from chair)  Activity Tolerance: Patient limited by fatigue;Patient tolerated treatment well Patient left: in chair;with call bell/phone within reach;with chair alarm set  OT Visit Diagnosis: Unsteadiness on feet (R26.81);Muscle weakness (generalized) (M62.81);History of falling (Z91.81);Pain                Time:  11:40 -12:10 OT Time Calculation (min): 30 min Charges:  OT General Charges $OT Visit: 1 Visit OT Evaluation $OT Eval Moderate Complexity: 1 Mod  Raynald Kemp, OT Acute Rehabilitation Services Office: [phone]   Pilar Grammes 06/24/2023, 12:47 PM

## 2023-06-25 DIAGNOSIS — M6282 Rhabdomyolysis: Secondary | ICD-10-CM | POA: Diagnosis not present

## 2023-06-25 LAB — CBC
HCT: 34.8 % — ABNORMAL LOW (ref 36.0–46.0)
Hemoglobin: 11.6 g/dL — ABNORMAL LOW (ref 12.0–15.0)
MCH: 30.8 pg (ref 26.0–34.0)
MCHC: 33.3 g/dL (ref 30.0–36.0)
MCV: 92.3 fL (ref 80.0–100.0)
Platelets: 152 10*3/uL (ref 150–400)
RBC: 3.77 MIL/uL — ABNORMAL LOW (ref 3.87–5.11)
RDW: 13.7 % (ref 11.5–15.5)
WBC: 6.4 10*3/uL (ref 4.0–10.5)
nRBC: 0 % (ref 0.0–0.2)

## 2023-06-25 LAB — MAGNESIUM: Magnesium: 1.8 mg/dL (ref 1.7–2.4)

## 2023-06-25 LAB — BASIC METABOLIC PANEL
Anion gap: 8 (ref 5–15)
BUN: 13 mg/dL (ref 8–23)
CO2: 25 mmol/L (ref 22–32)
Calcium: 8.5 mg/dL — ABNORMAL LOW (ref 8.9–10.3)
Chloride: 104 mmol/L (ref 98–111)
Creatinine, Ser: 0.98 mg/dL (ref 0.44–1.00)
GFR, Estimated: 52 mL/min — ABNORMAL LOW (ref >60)
Glucose, Bld: 97 mg/dL (ref 70–99)
Potassium: 4.1 mmol/L (ref 3.5–5.1)
Sodium: 137 mmol/L (ref 135–145)

## 2023-06-25 LAB — CK: Total CK: 866 U/L — ABNORMAL HIGH (ref 38–234)

## 2023-06-25 MED ORDER — DONEPEZIL HCL 10 MG PO TABS
10.0000 mg | ORAL_TABLET | Freq: Every day | ORAL | Status: DC
  Administered 2023-06-25 – 2023-06-26 (×2): 10 mg via ORAL
  Filled 2023-06-25 (×2): qty 1

## 2023-06-25 NOTE — TOC Initial Note (Addendum)
 Transition of Care [facility]) - Initial/Assessment Note    Patient Details  Name: Valerie Barker MRN: [id number] Date of Birth: 1922/04/09  Transition of Care [facility]) CM/SW Contact:    Helene Kelp, LCSW Phone Number: 06/25/2023, 2:14 PM  Clinical Narrative:                 CSW followed-up disposition recommendations (SNF placement).  CSW completed initial TOC work-up/assessment as noted by the following below.   CSW attempted to speak with the patient to review SNF referral process per clinical recommendations and assessed the pt's SNF preference. The patient was a sleep at the time of the effort.   The CSW contacted the patient's natural support (son; Lorin Picket & daughter: Tamela Oddi) and reviewed the clinical recommendations and assess SNF preference. The natural supports expressed no SNF preference.   Note: The patient's adult children expressed an alternative to having the patient discharge to the LTAC [facility] will be able to take her, and she receive Rehab there.  The family is leaning towards a rehab facility, after processing with them about the referral process, Please follow-up with the family regarding which facilities will take the patient.    CSW referral efforts to support the patient's disposition FL2:  PASRR:  SNF referrals:   TOC Disposition follow-up needs  Please provide the patient or natural support with bed-offer updates.  Please continue with SNF placement efforts. Please update the clinical team to SNF placement efforts:  No other needs identified by this Clinical research associate currently. Patient needs and current disposition to be followed by    Expected Discharge Plan: Skilled Nursing Facility Barriers to Discharge: Continued Medical Work up   Patient Goals and CMS Choice       [place] ownership interest in [facility].provided to:: Adult Children    Expected Discharge Plan and Services     Post Acute Care Choice: Skilled Nursing  Facility Living arrangements for the past 2 months: Single Family Home                                      Prior Living Arrangements/Services Living arrangements for the past 2 months: Single Family Home Lives with:: Self   Do you feel safe going back to the place where you live?: Yes      Need for Family Participation in Patient Care: Yes (Comment) Care giver support system in place?: Yes (comment)   Criminal Activity/Legal Involvement Pertinent to Current Situation/Hospitalization: No - Comment as needed  Activities of Daily Living   ADL Screening (condition at time of admission) Independently performs ADLs?: No Does the patient have a NEW difficulty with bathing/dressing/toileting/self-feeding that is expected to last >3 days?: Yes (Initiates electronic notice to provider for possible OT consult) Does the patient have a NEW difficulty with getting in/out of bed, walking, or climbing stairs that is expected to last >3 days?: Yes (Initiates electronic notice to provider for possible PT consult) Does the patient have a NEW difficulty with communication that is expected to last >3 days?: No Is the patient deaf or have difficulty hearing?: Yes Does the patient have difficulty seeing, even when wearing glasses/contacts?: No Does the patient have difficulty concentrating, remembering, or making decisions?: Yes  Permission Sought/Granted Permission sought to share information with : Case Manager, Magazine features editor, Family Supports Permission granted to share information with : Yes, Verbal Permission Granted  Share Information with NAME: Son: Dariella Gillihan & Daughter: Clemencia Course & Daughter: Philip Aspen           Emotional Assessment       Orientation: : Oriented to Self, Oriented to Place, Oriented to Situation Alcohol / Substance Use: Not Applicable Psych Involvement: No (comment)  Admission diagnosis:  Rhabdomyolysis [M62.82] Elevated CK  [R74.8] Contusion of face, initial encounter [S00.83XA] Fall, initial encounter [W19.XXXA] Acute pain of both knees [M25.561, M25.562] Acute bilateral ankle pain [M25.571, M25.572] Patient Active Problem List   Diagnosis Date Noted   Rhabdomyolysis 06/22/2023   Tachycardia-bradycardia syndrome (HCC) 12/05/2022   Acute kidney injury superimposed on chronic kidney disease (HCC) 12/05/2022   Diastolic heart failure with preserved EF 60-65 (HCC) 12/05/2022   Essential hypertension 12/05/2022   IBS (irritable bowel syndrome) 12/05/2022   History of premature ventricular complex 12/05/2022   Hypothyroidism 12/05/2022   Bradycardia 12/05/2022   Near syncope 11/16/2022   Elevated troponin 11/16/2022   Excessive daytime sleepiness 11/20/2019   History of CVA (cerebrovascular accident) 04/17/2018   Memory impairment 12/16/2015   Restless leg syndrome 08/26/2015   Hyperlipidemia 08/26/2015   Sinus bradycardia 02/06/2015   OAB (overactive bladder) 01/14/2015   Lower extremity edema 08/05/2014   Mixed incontinence 07/21/2014   Short-term memory loss 02/01/2014   Palpitation 06/03/2013   HTN (hypertension) 01/28/2013   Arm numbness 01/28/2013   PCP:  Merri Brunette, MD Pharmacy:   [facility] [place], [place] - [address] [place] [address] Phone: [phone] Fax: [phone]     Social Drivers of Health (SDOH) Social History: SDOH Screenings   Food Insecurity: No Food Insecurity (06/23/2023)  Housing: Low Risk  (06/23/2023)  Transportation Needs: No Transportation Needs (06/23/2023)  Utilities: Not At Risk (06/23/2023)  Social Connections: Moderately Isolated (06/23/2023)  Tobacco Use: Low Risk  (06/22/2023)   SDOH Interventions:     Readmission Risk Interventions    12/05/2022    1:46 PM  Readmission Risk Prevention Plan  Transportation Screening Complete  PCP or Specialist Appt within 5-7 Days Complete  Home  Care Screening Complete  Medication Review (RN CM) Complete

## 2023-06-25 NOTE — Progress Notes (Signed)
 [facility] Progress Note Patient: Valerie Barker ZOX:[id number] DOB: [birth year] DOA: 06/22/2023  DOS: the patient was seen and examined on 06/25/2023  Brief Hospital Course: PMH of HTN, IBS, CVA, hypothyroidism, chronic constipation present to the hospital with complaints of fall. Fall is unwitnessed.  Patient reports that this is secondary to chronic numbness in bilateral feet. Found to have rhabdomyolysis.  Given IV fluid.  Assessment and Plan: Unwitnessed fall. Etiology not clear. Patient reports that she has chronic numbness and does not have an obstruction. Patient has 24/7 caregivers. No other focal deficits on my evaluation. No evidence of syncope.  CT head and C-spine unremarkable. Echocardiogram in August 2024 as well as long-term Holter monitor in September also unremarkable. PT OT consulted.  Recommended SNF.  Will monitor.  Currently does not have 24/7 assistance at her ALF.  Nontraumatic rhabdomyolysis. CK level was 1000 other, while patient. Worsened to 4000.  Now trending down Treated with aggressive IV hydration.  Concern for UTI. Recurrent UTI. Currently does not have antibiotic potential and less likely UTI. Will monitor.  Continue trimethoprim.  Chronic neuropathy. Per patient that is the cause of her fall. B12 was severely low in the past currently normal.  Folic acid also normal. PT OT recommending SNF. Outpatient follow-up with neurology recommended.  Chronic constipation. For now we will monitor but  CKD stage IIIa. Baseline serum creatinine around 1.1. Currently improving to that baseline level for Monitor.  Elevated AST. In the setting of CK elevation. For now we will monitor.  Lactic acid elevation. Likely from dehydration. Treated with hydration. Monitor.  Hypothyroidism.  Send continuing Synthroid.  HTN. Hydrochlorothiazide on hold for  CAD. On Plavix. Monitor for now.  Restless leg syndrome. On Requip. Continue.     Subjective: Improving liquid, unchanged numbness.  Improving shortness of breath.  Physical Exam: No edema. No crackles. S1-S2 present.  Data Reviewed: I have Reviewed nursing notes, Vitals, and Lab results. CBC and BMP.  Disposition: Status is: Inpatient Remains inpatient appropriate because: Await placement.  heparin injection 5,000 Units Start: 06/23/23 0600   Family Communication: Discussed with son on the phone. Level of care: Med-Surg   Vitals:   06/24/23 1937 06/25/23 0500 06/25/23 0854 06/25/23 1442  BP: (!) 150/63 129/60 (!) 160/68 (!) 143/60  Pulse: 68  61 68  Resp: 18 16 18 18   Temp: 98 F (36.7 C) 97.8 F (36.6 C) 98.6 F (37 C) 98.2 F (36.8 C)  TempSrc: Oral Oral    SpO2: 94% 95% 93% 91%  Weight:      Height:         Author: Lynden Oxford, MD 06/25/2023 8:03 PM  Please look on [email or web address] to find out who is on call.

## 2023-06-25 NOTE — NC FL2 (Signed)
 Valerie Barker [facility] [place] [facility] LEVEL OF CARE FORM     IDENTIFICATION  Patient Name: Valerie Barker Birthdate: 1922-05-12 Sex: female Admission Date (Current Location): 06/22/2023  [facility] and [place] Number:  Producer, television/film/video and Address:  [facility], [address], [place], [place] [address]      Provider Number: [id number]  Attending Physician Name and Address:  Rolly Salter, MD  Relative Name and Phone Number:       Current Level of Care: SNF Recommended Level of Care: Skilled Nursing Facility Prior Approval Number:    Date Approved/Denied: 06/25/23 PASRR Number: [id number]  Discharge Plan: SNF    Current Diagnoses: Patient Active Problem List   Diagnosis Date Noted   Rhabdomyolysis 06/22/2023   Tachycardia-bradycardia syndrome (HCC) 12/05/2022   Acute kidney injury superimposed on chronic kidney disease (HCC) 12/05/2022   Diastolic heart failure with preserved EF 60-65 (HCC) 12/05/2022   Essential hypertension 12/05/2022   IBS (irritable bowel syndrome) 12/05/2022   History of premature ventricular complex 12/05/2022   Hypothyroidism 12/05/2022   Bradycardia 12/05/2022   Near syncope 11/16/2022   Elevated troponin 11/16/2022   Excessive daytime sleepiness 11/20/2019   History of CVA (cerebrovascular accident) 04/17/2018   Memory impairment 12/16/2015   Restless leg syndrome 08/26/2015   Hyperlipidemia 08/26/2015   Sinus bradycardia 02/06/2015   OAB (overactive bladder) 01/14/2015   Lower extremity edema 08/05/2014   Mixed incontinence 07/21/2014   Short-term memory loss 02/01/2014   Palpitation 06/03/2013   HTN (hypertension) 01/28/2013   Arm numbness 01/28/2013    Orientation RESPIRATION BLADDER Height & Weight     Self, Situation, Place  Normal Incontinent Weight: 102 lb 15.3 oz (46.7 kg) Height:  4\' 11"  (149.9 cm)  BEHAVIORAL SYMPTOMS/MOOD NEUROLOGICAL BOWEL NUTRITION STATUS      Continent Diet  AMBULATORY STATUS  COMMUNICATION OF NEEDS Skin   Limited Assist Verbally                         Personal Care Assistance Level of Assistance  Bathing, Dressing Bathing Assistance: Limited assistance   Dressing Assistance: Limited assistance     Functional Limitations Info  Sight, Hearing Sight Info: Impaired Hearing Info: Impaired      SPECIAL CARE FACTORS FREQUENCY  PT (By licensed PT), OT (By licensed OT)     PT Frequency: 5x OT Frequency: 3x            Contractures      Additional Factors Info  Code Status, Allergies Code Status Info: DNR Allergies Info: Ambien (Zolpidem), Cymbalta (Duloxetine Hcl), Desyrel (Trazodone), Erythromycin, Remeron (Mirtazapine), Flexeril (Cyclobenzaprine), Valium (Diazepam)           Current Medications (06/25/2023):  This is the current hospital active medication list Current Facility-Administered Medications  Medication Dose Route Frequency Provider Last Rate Last Admin   acetaminophen (TYLENOL) tablet 650 mg  650 mg Oral Q6H PRN Buena Irish, MD   650 mg at 06/25/23 1253   Or   acetaminophen (TYLENOL) suppository 650 mg  650 mg Rectal Q6H PRN Buena Irish, MD       bisacodyl (DULCOLAX) EC tablet 5 mg  5 mg Oral Daily PRN Buena Irish, MD       clopidogrel (PLAVIX) tablet 75 mg  75 mg Oral Daily Buena Irish, MD   75 mg at 06/25/23 1010   donepezil (ARICEPT) tablet 10 mg  10 mg Oral QHS Rolly Salter, MD  heparin injection 5,000 Units  5,000 Units Subcutaneous Q8H Buena Irish, MD   5,000 Units at 06/25/23 1254   levothyroxine (SYNTHROID) tablet 25 mcg  25 mcg Oral Once per day on Monday Tuesday Wednesday Thursday Friday Saturday Buena Irish, MD   25 mcg at 06/24/23 1610   levothyroxine (SYNTHROID) tablet 50 mcg  50 mcg Oral Every Sunday Buena Irish, MD   50 mcg at 06/25/23 0501   melatonin tablet 3 mg  3 mg Oral QHS PRN Buena Irish, MD   3 mg at 06/24/23 2226   ondansetron (ZOFRAN) tablet  4 mg  4 mg Oral Q6H PRN Buena Irish, MD       Or   ondansetron [facility]) injection 4 mg  4 mg Intravenous Q6H PRN Buena Irish, MD       rOPINIRole (REQUIP) tablet 2 mg  2 mg Oral 2 times per day Buena Irish, MD   2 mg at 06/25/23 1253   trimethoprim (TRIMPEX) tablet 100 mg  100 mg Oral q morning Buena Irish, MD   100 mg at 06/25/23 1010     Discharge Medications: Please see discharge summary for a list of discharge medications.  Relevant Imaging Results:  Relevant Lab Results:   Additional Information SS#: [id number]  Valerie Kelp, LCSW

## 2023-06-26 DIAGNOSIS — M6282 Rhabdomyolysis: Secondary | ICD-10-CM | POA: Diagnosis not present

## 2023-06-26 NOTE — TOC Progression Note (Addendum)
 Transition of Care [facility]) - Progression Note    Patient Details  Name: Valerie Barker MRN: [id number] Date of Birth: [birth year]  Transition of Care [facility]) CM/SW Contact  Lorri Frederick, LCSW Phone Number: 06/26/2023, 10:21 AM  Clinical Narrative:   CSW spoke with daughter Tamela Oddi, who confirmed they do want to pursue SNF.  [facility] first choices.  CSW reached out to those facilities to review.   1145: Both [facility] and [facility] have offered beds.  Son Lorin Picket, daughter Tamela Oddi both in room now, they are informed, they want to stop by both places before choosing.   1400: TC son Lorin Picket: they want to accept offer at [facility].    SNF auth request submitted in [place].    Expected Discharge Plan: Skilled Nursing Facility Barriers to Discharge: Continued Medical Work up  Expected Discharge Plan and Services     Post Acute Care Choice: Skilled Nursing Facility Living arrangements for the past 2 months: Single Family Home                                       Social Determinants of Health (SDOH) Interventions SDOH Screenings   Food Insecurity: No Food Insecurity (06/23/2023)  Housing: Low Risk  (06/23/2023)  Transportation Needs: No Transportation Needs (06/23/2023)  Utilities: Not At Risk (06/23/2023)  Social Connections: Moderately Isolated (06/23/2023)  Tobacco Use: Low Risk  (06/22/2023)    Readmission Risk Interventions    12/05/2022    1:46 PM  Readmission Risk Prevention Plan  Transportation Screening Complete  PCP or Specialist Appt within 5-7 Days Complete  Home Care Screening Complete  Medication Review (RN CM) Complete

## 2023-06-26 NOTE — Progress Notes (Signed)
 [facility] PROGRESS NOTE  Patient: Valerie Barker ZOX:[id number]   PCP: Merri Brunette, MD DOB: 19-Jan-1923   DOA: 06/22/2023   DOS: 06/26/2023    Subjective: No nausea no vomiting no fever no chills.  No other acute complaint.  Objective:  Vitals:   06/25/23 2008 06/26/23 0517 06/26/23 0809 06/26/23 1326  BP: 134/79 138/75 135/64 134/84  Pulse: 66 70 (!) 56 80  Resp: 16 16 17 15   Temp: 98.3 F (36.8 C) 97.9 F (36.6 C) 98.4 F (36.9 C) 99.1 F (37.3 C)  TempSrc:   Oral Oral  SpO2: 97% 96% 98% 95%  Weight:      Height:       Clear to auscultation. No edema. S1-S2 present.  Assessment and plan: Nontraumatic rhabdomyolysis. Currently stable. Awaiting placement.  Author: Lynden Oxford, MD [facility] 06/26/2023 7:02 PM   If 7PM-7AM, please contact night-coverage at [email or web address]

## 2023-06-26 NOTE — Care Management Important Message (Signed)
 Important Message  Patient Details  Name: Valerie Barker MRN: [id number] Date of Birth: 1922-06-15   Important Message Given:  Yes - Medicare IM     [name] 06/26/2023, 12:42 PM

## 2023-06-27 DIAGNOSIS — I495 Sick sinus syndrome: Secondary | ICD-10-CM | POA: Diagnosis not present

## 2023-06-27 DIAGNOSIS — R278 Other lack of coordination: Secondary | ICD-10-CM | POA: Diagnosis not present

## 2023-06-27 DIAGNOSIS — N189 Chronic kidney disease, unspecified: Secondary | ICD-10-CM | POA: Diagnosis not present

## 2023-06-27 DIAGNOSIS — R2681 Unsteadiness on feet: Secondary | ICD-10-CM | POA: Diagnosis not present

## 2023-06-27 DIAGNOSIS — R2689 Other abnormalities of gait and mobility: Secondary | ICD-10-CM | POA: Diagnosis not present

## 2023-06-27 DIAGNOSIS — M6282 Rhabdomyolysis: Secondary | ICD-10-CM | POA: Diagnosis not present

## 2023-06-27 DIAGNOSIS — I5030 Unspecified diastolic (congestive) heart failure: Secondary | ICD-10-CM | POA: Diagnosis not present

## 2023-06-27 DIAGNOSIS — R001 Bradycardia, unspecified: Secondary | ICD-10-CM | POA: Diagnosis not present

## 2023-06-27 DIAGNOSIS — M6281 Muscle weakness (generalized): Secondary | ICD-10-CM | POA: Diagnosis not present

## 2023-06-27 DIAGNOSIS — E785 Hyperlipidemia, unspecified: Secondary | ICD-10-CM | POA: Diagnosis not present

## 2023-06-27 DIAGNOSIS — R55 Syncope and collapse: Secondary | ICD-10-CM | POA: Diagnosis not present

## 2023-06-27 DIAGNOSIS — K589 Irritable bowel syndrome without diarrhea: Secondary | ICD-10-CM | POA: Diagnosis not present

## 2023-06-27 DIAGNOSIS — I5032 Chronic diastolic (congestive) heart failure: Secondary | ICD-10-CM | POA: Diagnosis not present

## 2023-06-27 DIAGNOSIS — R531 Weakness: Secondary | ICD-10-CM | POA: Diagnosis not present

## 2023-06-27 DIAGNOSIS — E039 Hypothyroidism, unspecified: Secondary | ICD-10-CM | POA: Diagnosis not present

## 2023-06-27 DIAGNOSIS — Z743 Need for continuous supervision: Secondary | ICD-10-CM | POA: Diagnosis not present

## 2023-06-27 DIAGNOSIS — I10 Essential (primary) hypertension: Secondary | ICD-10-CM | POA: Diagnosis not present

## 2023-06-27 DIAGNOSIS — Z7401 Bed confinement status: Secondary | ICD-10-CM | POA: Diagnosis not present

## 2023-06-27 MED ORDER — ATORVASTATIN CALCIUM 10 MG PO TABS: 10.0000 mg | ORAL_TABLET | Freq: Every day | ORAL | Status: AC

## 2023-06-27 NOTE — Discharge Instructions (Signed)
 Please follow-up with Primary Care Physician in 1-2 weeks.  It is imperative that you return to your primary care physician (or establish a relationship with a primary care physician if you do not have one) for your care needs and to authorize any refills for discharge medications or discuss any pending lab results.   Please request your primary care physician to go over all Hospital Tests and Procedure/Radiological results at the follow up.  Please get all Hospital records sent to your PCP by signing hospital release before you go home.   DO NOT DRIVE or operating heavy machinery, perform activities at heights, swimming or participation in water activities or provide baby sitting services; until you have been seen by Primary Care Physician and are cleared to do such activities.   Do not take more than prescribed Pain, Sleep and Anxiety Medications.   Once you are discharged, your primary care physician will help you with any further medical issues.  You Must read complete instructions/literature along with all the possible adverse reactions/side effects for all the Medicines you take and that have been prescribed to you. Take any new Medicines after you have completely understood and accept all the possible adverse reactions/side effects.

## 2023-06-27 NOTE — Discharge Summary (Signed)
 Physician Discharge Summary   Patient: Valerie Barker MRN: [id number] DOB: Aug 08, 1922  Admit date:     06/22/2023  Discharge date: 06/27/23  Discharge Physician: Lynden Oxford  PCP: Merri Brunette, MD  Recommendations at discharge: Follow-up with PCP in 1 week.   Follow-up Information     Merri Brunette, MD. Schedule an appointment as soon as possible for a visit in 2 week(s).   Specialty: Internal Medicine Contact information: [address] [place] [address] [place] [address] [phone]                Discharge Diagnoses: Principal Problem:   Rhabdomyolysis Active Problems:   Diastolic heart failure with preserved EF 60-65 [facility])   Essential hypertension   Hypothyroidism    Hospital Course: PMH of HTN, IBS, CVA, hypothyroidism, chronic constipation present to the hospital with complaints of fall. Fall is unwitnessed.  Patient reports that this is secondary to chronic numbness in bilateral feet. Found to have rhabdomyolysis.  Given IV fluid.   Assessment and Plan: Unwitnessed fall. Etiology not clear. Patient reports that she has chronic numbness and does not have an obstruction. Patient has 24/7 caregivers. No other focal deficits on my evaluation. No evidence of syncope.  CT head and C-spine unremarkable. Echocardiogram in August 2024 as well as long-term Holter monitor in September also unremarkable. PT OT recommended SNF.   Nontraumatic rhabdomyolysis. CK level was 1000 other, while patient. Worsened to 4000.  Now trending down Treated with aggressive IV hydration. Resume Lipitor next week.   Concern for UTI. Recurrent UTI. Currently does not have antibiotic indication and less likely UTI. Continue trimethoprim.   Chronic neuropathy. Per patient that is the cause of her fall. B12 was severely low in the past currently normal.  Folic acid also normal. PT OT recommending SNF. Outpatient follow-up with neurology recommended.   Chronic  constipation. Continue bowel regimen.   CKD stage IIIa. Baseline serum creatinine around 1.1. Currently improving to that baseline level for Monitor.   Elevated AST. In the setting of CK elevation. For now we will monitor.   Lactic acid elevation. Likely from dehydration. Treated with hydration. Monitor.   Hypothyroidism.  Continue Synthroid.   HTN. Hydrochlorothiazide on hold since the patient is on trimethoprim for UTI suppression and at risk for dehydration.   CAD. On Plavix. Monitor for now.   Restless leg syndrome. On Requip. Continue.   Consultants:  None  Procedures performed:  None  DISCHARGE MEDICATION: Allergies as of 06/27/2023       Reactions   Ambien [zolpidem] Other (See Comments)   Unknown reaction   Cymbalta [duloxetine Hcl] Other (See Comments)   Unknown reaction   Desyrel [trazodone] Other (See Comments)   Unknown reaction   Erythromycin Other (See Comments)   Unknown reaction   Remeron [mirtazapine] Other (See Comments)   Unknown reaction   Flexeril [cyclobenzaprine] Anxiety   Valium [diazepam] Rash        Medication List     STOP taking these medications    hydrochlorothiazide 12.5 MG tablet Commonly known as: HYDRODIURIL       TAKE these medications    acetaminophen 500 MG tablet Commonly known as: TYLENOL Take 500 mg by mouth daily as needed for mild pain (pain score 1-3) or moderate pain (pain score 4-6).   atorvastatin 10 MG tablet Commonly known as: LIPITOR Take 1 tablet (10 mg total) by mouth daily. Start taking on: July 04, 2023 What changed: These instructions start on July 04, 2023. If you are unsure what to do until then, ask your doctor or other care provider.   B-12 + Folic Acid 2500-400 MCG Tbdp Take 1 tablet by mouth daily.   Biotin 78295 MCG Tabs Take 1 tablet by mouth every evening.   CALCIUM-VITAMIN D PO Take 1 capsule by mouth every evening. 600-500   clopidogrel 75 MG tablet Commonly known  as: PLAVIX Take 75 mg by mouth daily.   donepezil 10 MG tablet Commonly known as: ARICEPT Take 10 mg by mouth at bedtime.   levothyroxine 25 MCG tablet Commonly known as: SYNTHROID Take 25-50 mcg by mouth See admin instructions. 25 mcg once daily before breakfast Monday-Saturday. 50 mcg on Sundays.   One-A-Day Womens 50+ Tabs Take 1 tablet by mouth daily.   rOPINIRole 1 MG tablet Commonly known as: REQUIP Take 2 mg by mouth See admin instructions. Take 2 mg every evening and take 2 mg by mouth at bedtime   trimethoprim 100 MG tablet Commonly known as: TRIMPEX Take 100 mg by mouth every morning.   Vitamin D 50 MCG (2000 UT) Caps Take 2,000 Units by mouth daily.       Disposition: SNF Diet recommendation: Regular diet  Discharge Exam: Vitals:   06/26/23 1326 06/26/23 2000 06/27/23 0500 06/27/23 0732  BP: 134/84 (!) 147/70 (!) 142/66 (!) 162/64  Pulse: 80 78 68 66  Resp: 15 16 18    Temp: 99.1 F (37.3 C) 98.1 F (36.7 C) 97.6 F (36.4 C) (!) 97.5 F (36.4 C)  TempSrc: Oral Oral Oral Oral  SpO2: 95% 91% 90% 95%  Weight:      Height:       General: Appear in no distress; no visible Abnormal Neck Mass Or lumps, Conjunctiva normal Cardiovascular: S1 and S2 Present, no Murmur, Respiratory: good respiratory effort, Bilateral Air entry present and CTA, no Crackles, no wheezes Abdomen: Bowel Sound present, Non tender  Extremities: no Pedal edema Neurology: alert and oriented to place and person  Filed Weights   06/22/23 1301 06/22/23 2248  Weight: 55 kg 46.7 kg   Condition at discharge: stable  The results of significant diagnostics from this hospitalization (including imaging, microbiology, ancillary and laboratory) are listed below for reference.   Imaging Studies: DG Ankle 2 Views Right Result Date: 06/22/2023 CLINICAL DATA:  Bilateral ankle pain after fall. EXAM: RIGHT ANKLE - 2 VIEW COMPARISON:  None Available. FINDINGS: There is no evidence of fracture,  dislocation, or joint effusion. There is no evidence of arthropathy or other focal bone abnormality. Soft tissues are unremarkable. IMPRESSION: Negative. Electronically Signed   By: Lupita Raider M.D.   On: 06/22/2023 15:44   DG Ankle 2 Views Left Result Date: 06/22/2023 CLINICAL DATA:  Bilateral ankle pain after fall. EXAM: LEFT ANKLE - 2 VIEW COMPARISON:  None Available. FINDINGS: There is no evidence of fracture, dislocation, or joint effusion. There is no evidence of arthropathy or other focal bone abnormality. Soft tissues are unremarkable. IMPRESSION: Negative. Electronically Signed   By: Lupita Raider M.D.   On: 06/22/2023 15:43   DG Knee 1-2 Views Right Result Date: 06/22/2023 CLINICAL DATA:  Bilateral knee pain after fall. EXAM: RIGHT KNEE - 1-2 VIEW COMPARISON:  None Available. FINDINGS: No evidence of fracture, dislocation, or joint effusion. No evidence of arthropathy or other focal bone abnormality. Soft tissues are unremarkable. IMPRESSION: Negative. Electronically Signed   By: Lupita Raider M.D.   On: 06/22/2023 15:42   DG Knee 1-2  Views Left Result Date: 06/22/2023 CLINICAL DATA:  Bilateral knee pain after fall. EXAM: LEFT KNEE - 1-2 VIEW COMPARISON:  None Available. FINDINGS: No evidence of fracture, dislocation, or joint effusion. No evidence of arthropathy or other focal bone abnormality. Soft tissues are unremarkable. IMPRESSION: Negative. Electronically Signed   By: Lupita Raider M.D.   On: 06/22/2023 15:41   CT HEAD WO CONTRAST Result Date: 06/22/2023 CLINICAL DATA:  Head trauma, moderate-severe; Polytrauma, blunt. Fall. On Plavix. EXAM: CT HEAD WITHOUT CONTRAST CT CERVICAL SPINE WITHOUT CONTRAST TECHNIQUE: Multidetector CT imaging of the head and cervical spine was performed following the standard protocol without intravenous contrast. Multiplanar CT image reconstructions of the cervical spine were also generated. RADIATION DOSE REDUCTION: This exam was performed according  to the departmental dose-optimization program which includes automated exposure control, adjustment of the mA and/or kV according to patient size and/or use of iterative reconstruction technique. COMPARISON:  CT scan head and cervical spine from 05/24/2023. FINDINGS: CT HEAD FINDINGS Brain: No evidence of acute infarction, hemorrhage, hydrocephalus, extra-axial collection or mass lesion/mass effect. There is bilateral periventricular hypodensity, which is non-specific but most likely seen in the settings of microvascular ischemic changes. Moderate in extent. Otherwise normal appearance of brain parenchyma. Ventricles are normal. Cerebral volume is age appropriate. Vascular: No hyperdense vessel or unexpected calcification. Intracranial arteriosclerosis. Skull: Normal. Negative for fracture or focal lesion. Sinuses/Orbits: No acute finding. Other: Visualized mastoid air cells are unremarkable. No mastoid effusion. CT CERVICAL SPINE FINDINGS Alignment: Normal. There is grade 1 anterolisthesis of C3 over C4 and C4 over C5, likely degenerative. There is minimal retrolisthesis of C5 over C6, also likely degenerative. This examination does not assess for ligamentous injury or stability. Skull base and vertebrae: No acute fracture. No primary bone lesion or focal pathologic process. Soft tissues and spinal canal: No prevertebral fluid or swelling. No visible canal hematoma. Disc levels: Mild multilevel reduced intervertebral disc height. There is vacuum phenomena at C6-7 level. Minimal facet arthropathy and marginal osteophyte formation. Upper chest: There are minimal ground-glass opacities in the left lung apex, nonspecific but likely sequela of prior infection or inflammation. Other: None. IMPRESSION: 1. No acute intracranial abnormality. 2. No acute osseous injury or traumatic listhesis of the cervical spine. 3. Multilevel degenerative changes of the cervical spine. Electronically Signed   By: Jules Schick M.D.   On:  06/22/2023 14:17   CT CERVICAL SPINE WO CONTRAST Result Date: 06/22/2023 CLINICAL DATA:  Head trauma, moderate-severe; Polytrauma, blunt. Fall. On Plavix. EXAM: CT HEAD WITHOUT CONTRAST CT CERVICAL SPINE WITHOUT CONTRAST TECHNIQUE: Multidetector CT imaging of the head and cervical spine was performed following the standard protocol without intravenous contrast. Multiplanar CT image reconstructions of the cervical spine were also generated. RADIATION DOSE REDUCTION: This exam was performed according to the departmental dose-optimization program which includes automated exposure control, adjustment of the mA and/or kV according to patient size and/or use of iterative reconstruction technique. COMPARISON:  CT scan head and cervical spine from 05/24/2023. FINDINGS: CT HEAD FINDINGS Brain: No evidence of acute infarction, hemorrhage, hydrocephalus, extra-axial collection or mass lesion/mass effect. There is bilateral periventricular hypodensity, which is non-specific but most likely seen in the settings of microvascular ischemic changes. Moderate in extent. Otherwise normal appearance of brain parenchyma. Ventricles are normal. Cerebral volume is age appropriate. Vascular: No hyperdense vessel or unexpected calcification. Intracranial arteriosclerosis. Skull: Normal. Negative for fracture or focal lesion. Sinuses/Orbits: No acute finding. Other: Visualized mastoid air cells are unremarkable. No mastoid effusion. CT  CERVICAL SPINE FINDINGS Alignment: Normal. There is grade 1 anterolisthesis of C3 over C4 and C4 over C5, likely degenerative. There is minimal retrolisthesis of C5 over C6, also likely degenerative. This examination does not assess for ligamentous injury or stability. Skull base and vertebrae: No acute fracture. No primary bone lesion or focal pathologic process. Soft tissues and spinal canal: No prevertebral fluid or swelling. No visible canal hematoma. Disc levels: Mild multilevel reduced intervertebral  disc height. There is vacuum phenomena at C6-7 level. Minimal facet arthropathy and marginal osteophyte formation. Upper chest: There are minimal ground-glass opacities in the left lung apex, nonspecific but likely sequela of prior infection or inflammation. Other: None. IMPRESSION: 1. No acute intracranial abnormality. 2. No acute osseous injury or traumatic listhesis of the cervical spine. 3. Multilevel degenerative changes of the cervical spine. Electronically Signed   By: Jules Schick M.D.   On: 06/22/2023 14:17   DG Pelvis Portable Result Date: 06/22/2023 CLINICAL DATA:  Trauma. EXAM: PORTABLE PELVIS 1-2 VIEWS COMPARISON:  11/16/2022. FINDINGS: Pelvis is intact with normal and symmetric sacroiliac joints. No acute fracture or dislocation. No aggressive osseous lesion. Visualized sacral arcuate lines are unremarkable. There are changes of chronic pubic symphisitis. There are mild degenerative changes of bilateral hip joints characterized by mild-to-moderate joint space narrowing (right > left) and osteophytosis of the superior acetabulum. No radiopaque foreign bodies. IMPRESSION: No acute osseous abnormality of the pelvis. Electronically Signed   By: Jules Schick M.D.   On: 06/22/2023 14:10   DG Chest Port 1 View Result Date: 06/22/2023 CLINICAL DATA:  Trauma.  Fall. EXAM: PORTABLE CHEST 1 VIEW COMPARISON:  03/20/2023. FINDINGS: Bilateral lung fields are clear. Bilateral costophrenic angles are clear. Normal cardio-mediastinal silhouette. No acute osseous abnormalities. The soft tissues are within normal limits. IMPRESSION: No active disease. Electronically Signed   By: Jules Schick M.D.   On: 06/22/2023 14:09    Microbiology: Results for orders placed or performed during the hospital encounter of 04/17/18  Culture, blood (routine x 2)     Status: None   Collection Time: 04/17/18  6:40 PM   Specimen: BLOOD LEFT ARM  Result Value Ref Range Status   Specimen Description BLOOD LEFT ARM  Final    Special Requests   Final    BOTTLES DRAWN AEROBIC ONLY Blood Culture results may not be optimal due to an inadequate volume of blood received in culture bottles   Culture   Final    NO GROWTH 5 DAYS Performed at [facility], [address] [place], [place] [address]    Report Status 04/22/2018 FINAL  Final  Culture, blood (routine x 2)     Status: None   Collection Time: 04/17/18  6:50 PM   Specimen: BLOOD RIGHT HAND  Result Value Ref Range Status   Specimen Description BLOOD RIGHT HAND  Final   Special Requests   Final    BOTTLES DRAWN AEROBIC ONLY Blood Culture adequate volume   Culture   Final    NO GROWTH 5 DAYS Performed at [facility], [address] [place], [place] [address]    Report Status 04/22/2018 FINAL  Final   Labs: CBC: Recent Labs  Lab 06/22/23 1300 06/22/23 1409 06/23/23 0005 06/23/23 1201 06/24/23 0415 06/25/23 0535  WBC 12.6*  --  9.1 6.9 6.5 6.4  NEUTROABS  --   --   --  4.5  --   --   HGB 13.0 11.9* 12.0 11.2* 12.1 11.6*  HCT 40.7 35.0* 35.5* 33.3* 36.9 34.8*  MCV 95.3  --  91.5 92.5 93.7 92.3  PLT 206  --  171 173 149* 152   Basic Metabolic Panel: Recent Labs  Lab 06/22/23 1300 06/22/23 1409 06/23/23 0944 06/24/23 0415 06/25/23 0535  NA 138 139 134* 140 137  K 3.9 3.7 4.4 3.7 4.1  CL 103 106 103 107 104  CO2 19*  --  21* 22 25  GLUCOSE 112* 110* 170* 94 97  BUN 22 22 20 14 13   CREATININE 1.20* 1.10* 1.02* 0.98 0.98  CALCIUM 9.7  --  8.8* 8.7* 8.5*  MG  --   --  1.6* 2.0 1.8   Liver Function Tests: Recent Labs  Lab 06/22/23 1300 06/23/23 0944  AST 52* 113*  ALT 32 43  ALKPHOS 51 44  BILITOT 0.8 0.8  PROT 6.8 5.4*  ALBUMIN 4.2 3.2*   CBG: No results for input(s): "GLUCAP" in the last 168 hours.  Discharge time spent: greater than 30 minutes.  Author: Lynden Oxford, MD  [facility]

## 2023-06-27 NOTE — TOC Transition Note (Signed)
 Transition of Care [facility]) - Discharge Note   Patient Details  Name: Valerie Barker MRN: [id number] Date of Birth: May 10, 1922  [facility] CM/SW Contact:  Lorri Frederick, LCSW Phone Number: 06/27/2023, 10:28 AM   Clinical Narrative:   Pt discharging to [facility], room 611.  RN call report to [phone].  PTAR called [phone].    Final next level of care: Skilled Nursing Facility Barriers to Discharge: Barriers Resolved   Patient Goals and CMS Choice       [place] ownership interest in [facility].provided to:: Adult Children    Discharge Placement              Patient chooses bed at: [facility] Patient to be transferred to facility by: PTAR Name of family member notified: son Lorin Picket Patient and family notified of of transfer: 06/27/23  Discharge Plan and Services Additional resources added to the After Visit Summary for       Post Acute Care Choice: Skilled Nursing Facility                               Social Drivers of Health (SDOH) Interventions SDOH Screenings   Food Insecurity: No Food Insecurity (06/23/2023)  Housing: Low Risk  (06/23/2023)  Transportation Needs: No Transportation Needs (06/23/2023)  Utilities: Not At Risk (06/23/2023)  Social Connections: Moderately Isolated (06/23/2023)  Tobacco Use: Low Risk  (06/22/2023)     Readmission Risk Interventions    12/05/2022    1:46 PM  Readmission Risk Prevention Plan  Transportation Screening Complete  PCP or Specialist Appt within 5-7 Days Complete  Home Care Screening Complete  Medication Review (RN CM) Complete

## 2023-06-27 NOTE — Progress Notes (Addendum)
 Physical Therapy Treatment Patient Details Name: Valerie Barker MRN: [id number] DOB: 1923-02-07 Today's Date: 06/27/2023   History of Present Illness Pt is 89 yo female with PMH of HTN, IBS, CVA, hypothyroidism, chronic constipation present to the hospital with complaints of fall.    PT Comments  Pt received in supine, agreeable to therapy session and with good participation and tolerance for transfer training at bedside using Stedy sit to stand lift. Pt needing up to minA (+2 safety) for sit<>stand x 3 trials and standing tolerance limited due to multiple episodes of urinary incontinence. Pt assisted to don lower body briefs/pants due to impending DC and needing up to modA for bed mobility with use of hospital bed features. Poor seated balance while scooting to EOB needing intermittent min/modA to correct and frequent safety cues. Pt remains at a high risk of falls at this time. Patient will benefit from continued inpatient follow up therapy, <3 hours/day    If plan is discharge home, recommend the following: Two people to help with walking and/or transfers;A lot of help with bathing/dressing/bathroom;Assistance with cooking/housework;Direct supervision/assist for medications management;Assist for transportation;Help with stairs or ramp for entrance   Can travel by private vehicle     No  Equipment Recommendations  Rolling walker (2 wheels);Wheelchair (measurements PT);Wheelchair cushion (measurements PT);BSC/3in1 (youth height RW)    Recommendations for Other Services       Precautions / Restrictions Precautions Precautions: Fall Recall of Precautions/Restrictions: Impaired Precaution/Restrictions Comments: cues to use BUE for support sitting on hospital bed and to utilize bed features PRN Restrictions Weight Bearing Restrictions Per Provider Order: Yes LLE Weight Bearing Per Provider Order: Weight bearing as tolerated     Mobility  Bed Mobility Overal bed mobility: Needs  Assistance Bed Mobility: Rolling, Sidelying to Sit, Sit to Sidelying Rolling: Min assist, Used rails Sidelying to sit: Mod assist, HOB elevated, Used rails     Sit to sidelying: Min assist, +2 for physical assistance, Used rails General bed mobility comments: Cues for safety and sequencing, pt with heavy use of hospital bed features and uses bed with rails to sit up at her ALF. Pt with c/o mid-back pain with rolling to her R on x2 attempts when pt being assisted to pull up pants, cues for log roll but pain still present; extra pillow support behind cx spine/upper back seems to help reduce her pain.    Transfers Overall transfer level: Needs assistance Equipment used: Rolling walker (2 wheels), Rollator (4 wheels) Transfers: Sit to/from Stand Sit to Stand: Min assist, +2 physical assistance, From elevated surface, Via lift equipment           General transfer comment: Able to stand from elevated bed x3 trials to [facility], on third trial pt able to stand ~2.5 mins while donning briefs and pants; on first 2 trials, pt with urinary incontinence and bed pan pulled under her hips for pt to use while sitting EOB (Stedy hand rails in front of her for support/safety). Purewick removed during process as pt imminently discharging. Pt totalA for peri-care standing in [place] with personal care assistant present in room and assisting her while PTA assists with standing balance/safety. Transfer via Lift Equipment: Stedy  Ambulation/Gait               General Gait Details: Limited due to pt urinary incontinence (briefs donned static standing) then arrival of transport team to her room.   Stairs  Wheelchair Mobility     Tilt Bed    Modified Rankin (Stroke Patients Only)       Balance Overall balance assessment: Needs assistance, History of Falls Sitting-balance support: Bilateral upper extremity supported, Feet supported Sitting balance-Leahy Scale: Fair     Standing  balance support: Bilateral upper extremity supported, During functional activity, Reliant on assistive device for balance Standing balance-Leahy Scale: Poor Standing balance comment: poor static standing balance, zero dynamic standing balance (using Stedy)                            Communication Communication Communication: Impaired Factors Affecting Communication: Hearing impaired  Cognition Arousal: Alert Behavior During Therapy: WFL for tasks assessed/performed, Anxious (Pt endorsed anxiety in standing position, fear of falling.)   PT - Cognitive impairments: Sequencing, Problem solving, Safety/Judgement                       PT - Cognition Comments: pt needs cues for safer body mechanics and reassurance due to pt expressed fear of falls. Pt tends to panic and lean backward (while sitting EOB) but able to improve balance with BUE support when cued. Following commands: Intact      Cueing Cueing Techniques: Verbal cues  Exercises      General Comments General comments (skin integrity, edema, etc.): VSS per chart review, no acute s/sx distress other than c/o mid-back pain while rolling; pt very kyphotic posture and forward head/rounded shoulders; briefs and poise pad donned before pt assisted to don her pants, as pt had multiple episodes of urinary incontinence while standing in [place]. TotalA for lower body dressing sitting EOB/standing.      Pertinent Vitals/Pain Pain Assessment Pain Assessment: Faces Faces Pain Scale: Hurts even more Pain Location: mid-back with log roll to R when adjusting pt's clothing to pull pants over her hips Pain Descriptors / Indicators: Discomfort, Grimacing, Guarding, Moaning Pain Intervention(s): Limited activity within patient's tolerance, Monitored during session, Repositioned (transport personnel notified; pt in care of transport team at end of session)    Home Living                          Prior Function             PT Goals (current goals can now be found in the care plan section) Acute Rehab PT Goals Patient Stated Goal: to be able to move better so I can go home PT Goal Formulation: With patient Time For Goal Achievement: 07/08/23 Progress towards PT goals: Progressing toward goals    Frequency    Min 2X/week      PT Plan      Co-evaluation              AM-PAC PT "6 Clicks" Mobility   Outcome Measure  Help needed turning from your back to your side while in a flat bed without using bedrails?: A Little Help needed moving from lying on your back to sitting on the side of a flat bed without using bedrails?: A Lot Help needed moving to and from a bed to a chair (including a wheelchair)?: Total Help needed standing up from a chair using your arms (e.g., wheelchair or bedside chair)?: A Lot Help needed to walk in hospital room?: Total Help needed climbing 3-5 steps with a railing? : Total 6 Click Score: 10    End of Session Equipment Utilized  During Treatment: Gait belt Activity Tolerance: Patient tolerated treatment well;Other (comment) (transport team arrival) Patient left: with call bell/phone within reach;in bed;Other (comment);with family/visitor present;with nursing/sitter in room (transport team employee and pt's PCA present in the room) Nurse Communication: Mobility status;Other (comment) (+ urinary incont and mid-back pain) PT Visit Diagnosis: Other abnormalities of gait and mobility (R26.89)     Time: 1610-9604 PT Time Calculation (min) (ACUTE ONLY): 26 min  Charges:    $Therapeutic Activity: 23-37 mins PT General Charges $$ ACUTE PT VISIT: 1 Visit                     Markita Stcharles P., PTA Acute Rehabilitation Services Secure Chat Preferred 9a-5:30pm Office: [phone]    Dorathy Kinsman [facility] 06/27/2023, 11:26 AM

## 2023-06-27 NOTE — Progress Notes (Signed)
 [facility] called to give report, message left with call back number.

## 2023-06-27 NOTE — TOC Progression Note (Addendum)
 Transition of Care [facility]) - Progression Note    Patient Details  Name: Valerie Barker MRN: [id number] Date of Birth: 12-22-1922  Transition of Care [facility]) CM/SW Contact  Lorri Frederick, LCSW Phone Number: 06/27/2023, 8:46 AM  Clinical Narrative:    SNF auth approved in [place]: [id number], 3 days: 3/25-3/27.  MD informed.  CSW confirmed with [facility] that they can receive pt today.  Expected Discharge Plan: Skilled Nursing Facility Barriers to Discharge: Continued Medical Work up  Expected Discharge Plan and Services     Post Acute Care Choice: Skilled Nursing Facility Living arrangements for the past 2 months: Single Family Home                                       Social Determinants of Health (SDOH) Interventions SDOH Screenings   Food Insecurity: No Food Insecurity (06/23/2023)  Housing: Low Risk  (06/23/2023)  Transportation Needs: No Transportation Needs (06/23/2023)  Utilities: Not At Risk (06/23/2023)  Social Connections: Moderately Isolated (06/23/2023)  Tobacco Use: Low Risk  (06/22/2023)    Readmission Risk Interventions    12/05/2022    1:46 PM  Readmission Risk Prevention Plan  Transportation Screening Complete  PCP or Specialist Appt within 5-7 Days Complete  Home Care Screening Complete  Medication Review (RN CM) Complete

## 2023-06-27 NOTE — Progress Notes (Signed)
 [facility] here to transport Valerie Barker to [facility], packet with AVS taken, all personal belonging taken, DNR form taken. Attempted to call [facility] again to give report unable to reach staff.

## 2023-06-29 DIAGNOSIS — I10 Essential (primary) hypertension: Secondary | ICD-10-CM | POA: Diagnosis not present

## 2023-06-29 DIAGNOSIS — I495 Sick sinus syndrome: Secondary | ICD-10-CM | POA: Diagnosis not present

## 2023-06-29 DIAGNOSIS — E039 Hypothyroidism, unspecified: Secondary | ICD-10-CM | POA: Diagnosis not present

## 2023-06-29 DIAGNOSIS — E785 Hyperlipidemia, unspecified: Secondary | ICD-10-CM | POA: Diagnosis not present

## 2023-06-30 ENCOUNTER — Other Ambulatory Visit: Payer: Self-pay | Admitting: Internal Medicine

## 2023-06-30 MED ORDER — PREGABALIN 25 MG PO CAPS: 25.0000 mg | ORAL_CAPSULE | Freq: Two times a day (BID) | ORAL | 0 refills | Status: AC

## 2023-07-06 DIAGNOSIS — I10 Essential (primary) hypertension: Secondary | ICD-10-CM | POA: Diagnosis not present

## 2023-07-06 DIAGNOSIS — I495 Sick sinus syndrome: Secondary | ICD-10-CM | POA: Diagnosis not present

## 2023-07-06 DIAGNOSIS — I5032 Chronic diastolic (congestive) heart failure: Secondary | ICD-10-CM | POA: Diagnosis not present

## 2023-07-18 DIAGNOSIS — M542 Cervicalgia: Secondary | ICD-10-CM | POA: Diagnosis not present

## 2023-07-18 DIAGNOSIS — R2681 Unsteadiness on feet: Secondary | ICD-10-CM | POA: Diagnosis not present

## 2023-07-18 DIAGNOSIS — R2689 Other abnormalities of gait and mobility: Secondary | ICD-10-CM | POA: Diagnosis not present

## 2023-07-27 DIAGNOSIS — R2681 Unsteadiness on feet: Secondary | ICD-10-CM | POA: Diagnosis not present

## 2023-07-27 DIAGNOSIS — M542 Cervicalgia: Secondary | ICD-10-CM | POA: Diagnosis not present

## 2023-07-27 DIAGNOSIS — R2689 Other abnormalities of gait and mobility: Secondary | ICD-10-CM | POA: Diagnosis not present

## 2023-07-28 DIAGNOSIS — R2689 Other abnormalities of gait and mobility: Secondary | ICD-10-CM | POA: Diagnosis not present

## 2023-07-28 DIAGNOSIS — M542 Cervicalgia: Secondary | ICD-10-CM | POA: Diagnosis not present

## 2023-07-28 DIAGNOSIS — R2681 Unsteadiness on feet: Secondary | ICD-10-CM | POA: Diagnosis not present

## 2023-07-29 DIAGNOSIS — R2681 Unsteadiness on feet: Secondary | ICD-10-CM | POA: Diagnosis not present

## 2023-07-29 DIAGNOSIS — R293 Abnormal posture: Secondary | ICD-10-CM | POA: Diagnosis not present

## 2023-07-29 DIAGNOSIS — R296 Repeated falls: Secondary | ICD-10-CM | POA: Diagnosis not present

## 2023-07-29 DIAGNOSIS — M25512 Pain in left shoulder: Secondary | ICD-10-CM | POA: Diagnosis not present

## 2023-08-01 DIAGNOSIS — R2681 Unsteadiness on feet: Secondary | ICD-10-CM | POA: Diagnosis not present

## 2023-08-01 DIAGNOSIS — M542 Cervicalgia: Secondary | ICD-10-CM | POA: Diagnosis not present

## 2023-08-01 DIAGNOSIS — R2689 Other abnormalities of gait and mobility: Secondary | ICD-10-CM | POA: Diagnosis not present

## 2023-08-04 DIAGNOSIS — M25512 Pain in left shoulder: Secondary | ICD-10-CM | POA: Diagnosis not present

## 2023-08-04 DIAGNOSIS — R293 Abnormal posture: Secondary | ICD-10-CM | POA: Diagnosis not present

## 2023-08-04 DIAGNOSIS — R296 Repeated falls: Secondary | ICD-10-CM | POA: Diagnosis not present

## 2023-08-04 DIAGNOSIS — R2681 Unsteadiness on feet: Secondary | ICD-10-CM | POA: Diagnosis not present

## 2023-08-07 DIAGNOSIS — R296 Repeated falls: Secondary | ICD-10-CM | POA: Diagnosis not present

## 2023-08-07 DIAGNOSIS — R293 Abnormal posture: Secondary | ICD-10-CM | POA: Diagnosis not present

## 2023-08-07 DIAGNOSIS — M542 Cervicalgia: Secondary | ICD-10-CM | POA: Diagnosis not present

## 2023-08-07 DIAGNOSIS — R2689 Other abnormalities of gait and mobility: Secondary | ICD-10-CM | POA: Diagnosis not present

## 2023-08-07 DIAGNOSIS — R2681 Unsteadiness on feet: Secondary | ICD-10-CM | POA: Diagnosis not present

## 2023-08-07 DIAGNOSIS — M25512 Pain in left shoulder: Secondary | ICD-10-CM | POA: Diagnosis not present

## 2023-08-08 DIAGNOSIS — R2689 Other abnormalities of gait and mobility: Secondary | ICD-10-CM | POA: Diagnosis not present

## 2023-08-08 DIAGNOSIS — M542 Cervicalgia: Secondary | ICD-10-CM | POA: Diagnosis not present

## 2023-08-08 DIAGNOSIS — R2681 Unsteadiness on feet: Secondary | ICD-10-CM | POA: Diagnosis not present

## 2023-08-09 DIAGNOSIS — R296 Repeated falls: Secondary | ICD-10-CM | POA: Diagnosis not present

## 2023-08-09 DIAGNOSIS — R2681 Unsteadiness on feet: Secondary | ICD-10-CM | POA: Diagnosis not present

## 2023-08-09 DIAGNOSIS — M25512 Pain in left shoulder: Secondary | ICD-10-CM | POA: Diagnosis not present

## 2023-08-09 DIAGNOSIS — M542 Cervicalgia: Secondary | ICD-10-CM | POA: Diagnosis not present

## 2023-08-09 DIAGNOSIS — R293 Abnormal posture: Secondary | ICD-10-CM | POA: Diagnosis not present

## 2023-08-09 DIAGNOSIS — R2689 Other abnormalities of gait and mobility: Secondary | ICD-10-CM | POA: Diagnosis not present

## 2023-08-10 DIAGNOSIS — R2681 Unsteadiness on feet: Secondary | ICD-10-CM | POA: Diagnosis not present

## 2023-08-10 DIAGNOSIS — Z961 Presence of intraocular lens: Secondary | ICD-10-CM | POA: Diagnosis not present

## 2023-08-10 DIAGNOSIS — H538 Other visual disturbances: Secondary | ICD-10-CM | POA: Diagnosis not present

## 2023-08-10 DIAGNOSIS — M25512 Pain in left shoulder: Secondary | ICD-10-CM | POA: Diagnosis not present

## 2023-08-10 DIAGNOSIS — R296 Repeated falls: Secondary | ICD-10-CM | POA: Diagnosis not present

## 2023-08-10 DIAGNOSIS — R293 Abnormal posture: Secondary | ICD-10-CM | POA: Diagnosis not present

## 2023-08-15 DIAGNOSIS — R293 Abnormal posture: Secondary | ICD-10-CM | POA: Diagnosis not present

## 2023-08-15 DIAGNOSIS — R296 Repeated falls: Secondary | ICD-10-CM | POA: Diagnosis not present

## 2023-08-15 DIAGNOSIS — M25512 Pain in left shoulder: Secondary | ICD-10-CM | POA: Diagnosis not present

## 2023-08-15 DIAGNOSIS — R2681 Unsteadiness on feet: Secondary | ICD-10-CM | POA: Diagnosis not present

## 2023-08-16 DIAGNOSIS — M542 Cervicalgia: Secondary | ICD-10-CM | POA: Diagnosis not present

## 2023-08-16 DIAGNOSIS — R296 Repeated falls: Secondary | ICD-10-CM | POA: Diagnosis not present

## 2023-08-16 DIAGNOSIS — R2689 Other abnormalities of gait and mobility: Secondary | ICD-10-CM | POA: Diagnosis not present

## 2023-08-16 DIAGNOSIS — R293 Abnormal posture: Secondary | ICD-10-CM | POA: Diagnosis not present

## 2023-08-16 DIAGNOSIS — M25512 Pain in left shoulder: Secondary | ICD-10-CM | POA: Diagnosis not present

## 2023-08-16 DIAGNOSIS — R2681 Unsteadiness on feet: Secondary | ICD-10-CM | POA: Diagnosis not present

## 2023-08-17 DIAGNOSIS — Z8673 Personal history of transient ischemic attack (TIA), and cerebral infarction without residual deficits: Secondary | ICD-10-CM | POA: Diagnosis not present

## 2023-08-17 DIAGNOSIS — I10 Essential (primary) hypertension: Secondary | ICD-10-CM | POA: Diagnosis not present

## 2023-08-17 DIAGNOSIS — G4700 Insomnia, unspecified: Secondary | ICD-10-CM | POA: Diagnosis not present

## 2023-08-17 DIAGNOSIS — Z0000 Encounter for general adult medical examination without abnormal findings: Secondary | ICD-10-CM | POA: Diagnosis not present

## 2023-08-17 DIAGNOSIS — M810 Age-related osteoporosis without current pathological fracture: Secondary | ICD-10-CM | POA: Diagnosis not present

## 2023-08-18 DIAGNOSIS — R2689 Other abnormalities of gait and mobility: Secondary | ICD-10-CM | POA: Diagnosis not present

## 2023-08-18 DIAGNOSIS — R2681 Unsteadiness on feet: Secondary | ICD-10-CM | POA: Diagnosis not present

## 2023-08-18 DIAGNOSIS — M542 Cervicalgia: Secondary | ICD-10-CM | POA: Diagnosis not present

## 2023-08-21 DIAGNOSIS — Z515 Encounter for palliative care: Secondary | ICD-10-CM | POA: Diagnosis not present

## 2023-08-21 DIAGNOSIS — R2689 Other abnormalities of gait and mobility: Secondary | ICD-10-CM | POA: Diagnosis not present

## 2023-08-21 DIAGNOSIS — R2681 Unsteadiness on feet: Secondary | ICD-10-CM | POA: Diagnosis not present

## 2023-08-21 DIAGNOSIS — M542 Cervicalgia: Secondary | ICD-10-CM | POA: Diagnosis not present

## 2023-08-22 DIAGNOSIS — R296 Repeated falls: Secondary | ICD-10-CM | POA: Diagnosis not present

## 2023-08-22 DIAGNOSIS — R293 Abnormal posture: Secondary | ICD-10-CM | POA: Diagnosis not present

## 2023-08-22 DIAGNOSIS — R2681 Unsteadiness on feet: Secondary | ICD-10-CM | POA: Diagnosis not present

## 2023-08-22 DIAGNOSIS — M25512 Pain in left shoulder: Secondary | ICD-10-CM | POA: Diagnosis not present

## 2023-08-23 DIAGNOSIS — M25512 Pain in left shoulder: Secondary | ICD-10-CM | POA: Diagnosis not present

## 2023-08-23 DIAGNOSIS — R296 Repeated falls: Secondary | ICD-10-CM | POA: Diagnosis not present

## 2023-08-23 DIAGNOSIS — R293 Abnormal posture: Secondary | ICD-10-CM | POA: Diagnosis not present

## 2023-08-23 DIAGNOSIS — R2681 Unsteadiness on feet: Secondary | ICD-10-CM | POA: Diagnosis not present

## 2023-08-24 DIAGNOSIS — R293 Abnormal posture: Secondary | ICD-10-CM | POA: Diagnosis not present

## 2023-08-24 DIAGNOSIS — R296 Repeated falls: Secondary | ICD-10-CM | POA: Diagnosis not present

## 2023-08-24 DIAGNOSIS — R2681 Unsteadiness on feet: Secondary | ICD-10-CM | POA: Diagnosis not present

## 2023-08-24 DIAGNOSIS — M25512 Pain in left shoulder: Secondary | ICD-10-CM | POA: Diagnosis not present

## 2023-08-25 DIAGNOSIS — M542 Cervicalgia: Secondary | ICD-10-CM | POA: Diagnosis not present

## 2023-08-25 DIAGNOSIS — R2681 Unsteadiness on feet: Secondary | ICD-10-CM | POA: Diagnosis not present

## 2023-08-25 DIAGNOSIS — R2689 Other abnormalities of gait and mobility: Secondary | ICD-10-CM | POA: Diagnosis not present

## 2023-08-29 DIAGNOSIS — R293 Abnormal posture: Secondary | ICD-10-CM | POA: Diagnosis not present

## 2023-08-29 DIAGNOSIS — M25512 Pain in left shoulder: Secondary | ICD-10-CM | POA: Diagnosis not present

## 2023-08-29 DIAGNOSIS — R2681 Unsteadiness on feet: Secondary | ICD-10-CM | POA: Diagnosis not present

## 2023-08-29 DIAGNOSIS — R296 Repeated falls: Secondary | ICD-10-CM | POA: Diagnosis not present

## 2023-08-30 DIAGNOSIS — R2689 Other abnormalities of gait and mobility: Secondary | ICD-10-CM | POA: Diagnosis not present

## 2023-08-30 DIAGNOSIS — R2681 Unsteadiness on feet: Secondary | ICD-10-CM | POA: Diagnosis not present

## 2023-08-30 DIAGNOSIS — M542 Cervicalgia: Secondary | ICD-10-CM | POA: Diagnosis not present

## 2023-09-01 DIAGNOSIS — R296 Repeated falls: Secondary | ICD-10-CM | POA: Diagnosis not present

## 2023-09-01 DIAGNOSIS — M25512 Pain in left shoulder: Secondary | ICD-10-CM | POA: Diagnosis not present

## 2023-09-01 DIAGNOSIS — R293 Abnormal posture: Secondary | ICD-10-CM | POA: Diagnosis not present

## 2023-09-01 DIAGNOSIS — R2681 Unsteadiness on feet: Secondary | ICD-10-CM | POA: Diagnosis not present

## 2023-09-04 DIAGNOSIS — R2689 Other abnormalities of gait and mobility: Secondary | ICD-10-CM | POA: Diagnosis not present

## 2023-09-04 DIAGNOSIS — R2681 Unsteadiness on feet: Secondary | ICD-10-CM | POA: Diagnosis not present

## 2023-09-04 DIAGNOSIS — M542 Cervicalgia: Secondary | ICD-10-CM | POA: Diagnosis not present

## 2023-09-05 DIAGNOSIS — M25512 Pain in left shoulder: Secondary | ICD-10-CM | POA: Diagnosis not present

## 2023-09-05 DIAGNOSIS — R293 Abnormal posture: Secondary | ICD-10-CM | POA: Diagnosis not present

## 2023-09-05 DIAGNOSIS — R296 Repeated falls: Secondary | ICD-10-CM | POA: Diagnosis not present

## 2023-09-05 DIAGNOSIS — R2681 Unsteadiness on feet: Secondary | ICD-10-CM | POA: Diagnosis not present

## 2023-09-06 DIAGNOSIS — R2681 Unsteadiness on feet: Secondary | ICD-10-CM | POA: Diagnosis not present

## 2023-09-06 DIAGNOSIS — M542 Cervicalgia: Secondary | ICD-10-CM | POA: Diagnosis not present

## 2023-09-06 DIAGNOSIS — R2689 Other abnormalities of gait and mobility: Secondary | ICD-10-CM | POA: Diagnosis not present

## 2023-09-07 DIAGNOSIS — M25512 Pain in left shoulder: Secondary | ICD-10-CM | POA: Diagnosis not present

## 2023-09-07 DIAGNOSIS — R2681 Unsteadiness on feet: Secondary | ICD-10-CM | POA: Diagnosis not present

## 2023-09-07 DIAGNOSIS — R296 Repeated falls: Secondary | ICD-10-CM | POA: Diagnosis not present

## 2023-09-07 DIAGNOSIS — R293 Abnormal posture: Secondary | ICD-10-CM | POA: Diagnosis not present

## 2023-09-08 DIAGNOSIS — R2681 Unsteadiness on feet: Secondary | ICD-10-CM | POA: Diagnosis not present

## 2023-09-08 DIAGNOSIS — R293 Abnormal posture: Secondary | ICD-10-CM | POA: Diagnosis not present

## 2023-09-08 DIAGNOSIS — M25512 Pain in left shoulder: Secondary | ICD-10-CM | POA: Diagnosis not present

## 2023-09-08 DIAGNOSIS — R296 Repeated falls: Secondary | ICD-10-CM | POA: Diagnosis not present

## 2023-09-12 DIAGNOSIS — R293 Abnormal posture: Secondary | ICD-10-CM | POA: Diagnosis not present

## 2023-09-12 DIAGNOSIS — R296 Repeated falls: Secondary | ICD-10-CM | POA: Diagnosis not present

## 2023-09-12 DIAGNOSIS — M25512 Pain in left shoulder: Secondary | ICD-10-CM | POA: Diagnosis not present

## 2023-09-12 DIAGNOSIS — R2681 Unsteadiness on feet: Secondary | ICD-10-CM | POA: Diagnosis not present

## 2023-09-13 DIAGNOSIS — R2681 Unsteadiness on feet: Secondary | ICD-10-CM | POA: Diagnosis not present

## 2023-09-13 DIAGNOSIS — M542 Cervicalgia: Secondary | ICD-10-CM | POA: Diagnosis not present

## 2023-09-13 DIAGNOSIS — R2689 Other abnormalities of gait and mobility: Secondary | ICD-10-CM | POA: Diagnosis not present

## 2023-09-13 DIAGNOSIS — R296 Repeated falls: Secondary | ICD-10-CM | POA: Diagnosis not present

## 2023-09-13 DIAGNOSIS — R293 Abnormal posture: Secondary | ICD-10-CM | POA: Diagnosis not present

## 2023-09-13 DIAGNOSIS — M25512 Pain in left shoulder: Secondary | ICD-10-CM | POA: Diagnosis not present

## 2023-09-14 ENCOUNTER — Encounter: Payer: Self-pay | Admitting: Podiatry

## 2023-09-14 ENCOUNTER — Ambulatory Visit: Admitting: Podiatry

## 2023-09-14 DIAGNOSIS — R293 Abnormal posture: Secondary | ICD-10-CM | POA: Diagnosis not present

## 2023-09-14 DIAGNOSIS — M79674 Pain in right toe(s): Secondary | ICD-10-CM

## 2023-09-14 DIAGNOSIS — D689 Coagulation defect, unspecified: Secondary | ICD-10-CM

## 2023-09-14 DIAGNOSIS — M25512 Pain in left shoulder: Secondary | ICD-10-CM | POA: Diagnosis not present

## 2023-09-14 DIAGNOSIS — B351 Tinea unguium: Secondary | ICD-10-CM

## 2023-09-14 DIAGNOSIS — M79675 Pain in left toe(s): Secondary | ICD-10-CM | POA: Diagnosis not present

## 2023-09-14 DIAGNOSIS — R2681 Unsteadiness on feet: Secondary | ICD-10-CM | POA: Diagnosis not present

## 2023-09-14 DIAGNOSIS — R296 Repeated falls: Secondary | ICD-10-CM | POA: Diagnosis not present

## 2023-09-14 NOTE — Progress Notes (Signed)
This patient returns to my office for at risk foot care.  This patient requires this care by a professional since this patient will be at risk due to having coagulation defect due to plavix. This patient is unable to cut nails herself since the patient cannot reach hernails.These nails are painful walking and wearing shoes.  This patient presents for at risk foot care today.  She presents to the [facility] with her son and is in a wheelchair.  General Appearance  Alert, conversant and in no acute stress.  Vascular  Dorsalis pedis and posterior tibial  pulses are palpable  bilaterally.  Capillary return is within normal limits  bilaterally. Temperature is within normal limits  bilaterally.  Neurologic  Senn-Weinstein monofilament wire test within normal limits  bilaterally. Muscle power within normal limits bilaterally.  Nails Thick disfigured discolored nails with subungual debris  from hallux to fifth toes bilaterally. No evidence of bacterial infection or drainage bilaterally.  Orthopedic  No limitations of motion  feet .  No crepitus or effusions noted.  No bony pathology or digital deformities noted.  Skin  normotropic skin with no porokeratosis noted bilaterally.  No signs of infections or ulcers noted.     Onychomycosis  Pain in right toes  Pain in left toes  Consent was obtained for treatment procedures.   Mechanical debridement of nails 1-5  bilaterally performed with a nail nipper.  Filed with dremel without incident.    Return office visit   3 months                   Told patient to return for periodic foot care and evaluation due to potential at risk complications.   Helane Gunther DPM

## 2023-09-18 DIAGNOSIS — R2681 Unsteadiness on feet: Secondary | ICD-10-CM | POA: Diagnosis not present

## 2023-09-18 DIAGNOSIS — R2689 Other abnormalities of gait and mobility: Secondary | ICD-10-CM | POA: Diagnosis not present

## 2023-09-18 DIAGNOSIS — M542 Cervicalgia: Secondary | ICD-10-CM | POA: Diagnosis not present

## 2023-09-19 DIAGNOSIS — R296 Repeated falls: Secondary | ICD-10-CM | POA: Diagnosis not present

## 2023-09-19 DIAGNOSIS — R2681 Unsteadiness on feet: Secondary | ICD-10-CM | POA: Diagnosis not present

## 2023-09-19 DIAGNOSIS — R293 Abnormal posture: Secondary | ICD-10-CM | POA: Diagnosis not present

## 2023-09-19 DIAGNOSIS — M25512 Pain in left shoulder: Secondary | ICD-10-CM | POA: Diagnosis not present

## 2023-09-22 DIAGNOSIS — R2681 Unsteadiness on feet: Secondary | ICD-10-CM | POA: Diagnosis not present

## 2023-09-22 DIAGNOSIS — R2689 Other abnormalities of gait and mobility: Secondary | ICD-10-CM | POA: Diagnosis not present

## 2023-09-22 DIAGNOSIS — M542 Cervicalgia: Secondary | ICD-10-CM | POA: Diagnosis not present

## 2023-09-25 DIAGNOSIS — R2681 Unsteadiness on feet: Secondary | ICD-10-CM | POA: Diagnosis not present

## 2023-09-25 DIAGNOSIS — R296 Repeated falls: Secondary | ICD-10-CM | POA: Diagnosis not present

## 2023-09-25 DIAGNOSIS — R293 Abnormal posture: Secondary | ICD-10-CM | POA: Diagnosis not present

## 2023-09-25 DIAGNOSIS — M25512 Pain in left shoulder: Secondary | ICD-10-CM | POA: Diagnosis not present

## 2023-09-26 DIAGNOSIS — R296 Repeated falls: Secondary | ICD-10-CM | POA: Diagnosis not present

## 2023-09-26 DIAGNOSIS — R293 Abnormal posture: Secondary | ICD-10-CM | POA: Diagnosis not present

## 2023-09-26 DIAGNOSIS — R2689 Other abnormalities of gait and mobility: Secondary | ICD-10-CM | POA: Diagnosis not present

## 2023-09-26 DIAGNOSIS — R2681 Unsteadiness on feet: Secondary | ICD-10-CM | POA: Diagnosis not present

## 2023-09-26 DIAGNOSIS — M542 Cervicalgia: Secondary | ICD-10-CM | POA: Diagnosis not present

## 2023-09-26 DIAGNOSIS — M25512 Pain in left shoulder: Secondary | ICD-10-CM | POA: Diagnosis not present

## 2023-09-27 DIAGNOSIS — Z515 Encounter for palliative care: Secondary | ICD-10-CM | POA: Diagnosis not present

## 2023-09-28 DIAGNOSIS — R2681 Unsteadiness on feet: Secondary | ICD-10-CM | POA: Diagnosis not present

## 2023-09-28 DIAGNOSIS — R2689 Other abnormalities of gait and mobility: Secondary | ICD-10-CM | POA: Diagnosis not present

## 2023-09-28 DIAGNOSIS — M542 Cervicalgia: Secondary | ICD-10-CM | POA: Diagnosis not present

## 2023-09-29 DIAGNOSIS — R296 Repeated falls: Secondary | ICD-10-CM | POA: Diagnosis not present

## 2023-09-29 DIAGNOSIS — R293 Abnormal posture: Secondary | ICD-10-CM | POA: Diagnosis not present

## 2023-09-29 DIAGNOSIS — R2681 Unsteadiness on feet: Secondary | ICD-10-CM | POA: Diagnosis not present

## 2023-09-29 DIAGNOSIS — M25512 Pain in left shoulder: Secondary | ICD-10-CM | POA: Diagnosis not present

## 2023-10-03 DIAGNOSIS — M542 Cervicalgia: Secondary | ICD-10-CM | POA: Diagnosis not present

## 2023-10-03 DIAGNOSIS — R2689 Other abnormalities of gait and mobility: Secondary | ICD-10-CM | POA: Diagnosis not present

## 2023-10-03 DIAGNOSIS — R2681 Unsteadiness on feet: Secondary | ICD-10-CM | POA: Diagnosis not present

## 2023-10-04 DIAGNOSIS — R293 Abnormal posture: Secondary | ICD-10-CM | POA: Diagnosis not present

## 2023-10-04 DIAGNOSIS — M25512 Pain in left shoulder: Secondary | ICD-10-CM | POA: Diagnosis not present

## 2023-10-04 DIAGNOSIS — R2681 Unsteadiness on feet: Secondary | ICD-10-CM | POA: Diagnosis not present

## 2023-10-04 DIAGNOSIS — R296 Repeated falls: Secondary | ICD-10-CM | POA: Diagnosis not present

## 2023-10-05 DIAGNOSIS — R2689 Other abnormalities of gait and mobility: Secondary | ICD-10-CM | POA: Diagnosis not present

## 2023-10-05 DIAGNOSIS — M542 Cervicalgia: Secondary | ICD-10-CM | POA: Diagnosis not present

## 2023-10-05 DIAGNOSIS — R2681 Unsteadiness on feet: Secondary | ICD-10-CM | POA: Diagnosis not present

## 2023-10-06 DIAGNOSIS — R296 Repeated falls: Secondary | ICD-10-CM | POA: Diagnosis not present

## 2023-10-06 DIAGNOSIS — R293 Abnormal posture: Secondary | ICD-10-CM | POA: Diagnosis not present

## 2023-10-06 DIAGNOSIS — M25512 Pain in left shoulder: Secondary | ICD-10-CM | POA: Diagnosis not present

## 2023-10-06 DIAGNOSIS — R2681 Unsteadiness on feet: Secondary | ICD-10-CM | POA: Diagnosis not present

## 2023-10-16 DIAGNOSIS — R296 Repeated falls: Secondary | ICD-10-CM | POA: Diagnosis not present

## 2023-10-16 DIAGNOSIS — R293 Abnormal posture: Secondary | ICD-10-CM | POA: Diagnosis not present

## 2023-10-16 DIAGNOSIS — M25512 Pain in left shoulder: Secondary | ICD-10-CM | POA: Diagnosis not present

## 2023-10-16 DIAGNOSIS — R2681 Unsteadiness on feet: Secondary | ICD-10-CM | POA: Diagnosis not present

## 2023-10-17 DIAGNOSIS — R296 Repeated falls: Secondary | ICD-10-CM | POA: Diagnosis not present

## 2023-10-17 DIAGNOSIS — R293 Abnormal posture: Secondary | ICD-10-CM | POA: Diagnosis not present

## 2023-10-17 DIAGNOSIS — M25512 Pain in left shoulder: Secondary | ICD-10-CM | POA: Diagnosis not present

## 2023-10-17 DIAGNOSIS — R2681 Unsteadiness on feet: Secondary | ICD-10-CM | POA: Diagnosis not present

## 2023-10-17 DIAGNOSIS — R2689 Other abnormalities of gait and mobility: Secondary | ICD-10-CM | POA: Diagnosis not present

## 2023-10-17 DIAGNOSIS — M542 Cervicalgia: Secondary | ICD-10-CM | POA: Diagnosis not present

## 2023-10-18 DIAGNOSIS — M25512 Pain in left shoulder: Secondary | ICD-10-CM | POA: Diagnosis not present

## 2023-10-18 DIAGNOSIS — R293 Abnormal posture: Secondary | ICD-10-CM | POA: Diagnosis not present

## 2023-10-18 DIAGNOSIS — R296 Repeated falls: Secondary | ICD-10-CM | POA: Diagnosis not present

## 2023-10-18 DIAGNOSIS — R2681 Unsteadiness on feet: Secondary | ICD-10-CM | POA: Diagnosis not present

## 2023-10-23 DIAGNOSIS — R2681 Unsteadiness on feet: Secondary | ICD-10-CM | POA: Diagnosis not present

## 2023-10-23 DIAGNOSIS — R2689 Other abnormalities of gait and mobility: Secondary | ICD-10-CM | POA: Diagnosis not present

## 2023-10-23 DIAGNOSIS — M542 Cervicalgia: Secondary | ICD-10-CM | POA: Diagnosis not present

## 2023-10-31 DIAGNOSIS — M542 Cervicalgia: Secondary | ICD-10-CM | POA: Diagnosis not present

## 2023-10-31 DIAGNOSIS — R2681 Unsteadiness on feet: Secondary | ICD-10-CM | POA: Diagnosis not present

## 2023-10-31 DIAGNOSIS — R2689 Other abnormalities of gait and mobility: Secondary | ICD-10-CM | POA: Diagnosis not present

## 2023-10-31 DIAGNOSIS — Z515 Encounter for palliative care: Secondary | ICD-10-CM | POA: Diagnosis not present

## 2023-11-09 DIAGNOSIS — R2681 Unsteadiness on feet: Secondary | ICD-10-CM | POA: Diagnosis not present

## 2023-11-09 DIAGNOSIS — M542 Cervicalgia: Secondary | ICD-10-CM | POA: Diagnosis not present

## 2023-11-09 DIAGNOSIS — R2689 Other abnormalities of gait and mobility: Secondary | ICD-10-CM | POA: Diagnosis not present

## 2023-11-16 DIAGNOSIS — R2689 Other abnormalities of gait and mobility: Secondary | ICD-10-CM | POA: Diagnosis not present

## 2023-11-16 DIAGNOSIS — M542 Cervicalgia: Secondary | ICD-10-CM | POA: Diagnosis not present

## 2023-11-16 DIAGNOSIS — R2681 Unsteadiness on feet: Secondary | ICD-10-CM | POA: Diagnosis not present

## 2023-11-28 DIAGNOSIS — M542 Cervicalgia: Secondary | ICD-10-CM | POA: Diagnosis not present

## 2023-11-28 DIAGNOSIS — R2681 Unsteadiness on feet: Secondary | ICD-10-CM | POA: Diagnosis not present

## 2023-11-28 DIAGNOSIS — R2689 Other abnormalities of gait and mobility: Secondary | ICD-10-CM | POA: Diagnosis not present

## 2023-12-07 DIAGNOSIS — R2681 Unsteadiness on feet: Secondary | ICD-10-CM | POA: Diagnosis not present

## 2023-12-07 DIAGNOSIS — M542 Cervicalgia: Secondary | ICD-10-CM | POA: Diagnosis not present

## 2023-12-07 DIAGNOSIS — R2689 Other abnormalities of gait and mobility: Secondary | ICD-10-CM | POA: Diagnosis not present

## 2023-12-14 ENCOUNTER — Ambulatory Visit: Admitting: Podiatry

## 2023-12-14 DIAGNOSIS — R2689 Other abnormalities of gait and mobility: Secondary | ICD-10-CM | POA: Diagnosis not present

## 2023-12-14 DIAGNOSIS — R2681 Unsteadiness on feet: Secondary | ICD-10-CM | POA: Diagnosis not present

## 2023-12-14 DIAGNOSIS — M542 Cervicalgia: Secondary | ICD-10-CM | POA: Diagnosis not present

## 2023-12-21 DIAGNOSIS — M542 Cervicalgia: Secondary | ICD-10-CM | POA: Diagnosis not present

## 2023-12-21 DIAGNOSIS — R2689 Other abnormalities of gait and mobility: Secondary | ICD-10-CM | POA: Diagnosis not present

## 2023-12-21 DIAGNOSIS — R2681 Unsteadiness on feet: Secondary | ICD-10-CM | POA: Diagnosis not present

## 2023-12-22 DIAGNOSIS — R2689 Other abnormalities of gait and mobility: Secondary | ICD-10-CM | POA: Diagnosis not present

## 2023-12-22 DIAGNOSIS — M542 Cervicalgia: Secondary | ICD-10-CM | POA: Diagnosis not present

## 2023-12-22 DIAGNOSIS — R2681 Unsteadiness on feet: Secondary | ICD-10-CM | POA: Diagnosis not present

## 2023-12-26 DIAGNOSIS — M542 Cervicalgia: Secondary | ICD-10-CM | POA: Diagnosis not present

## 2023-12-26 DIAGNOSIS — R2689 Other abnormalities of gait and mobility: Secondary | ICD-10-CM | POA: Diagnosis not present

## 2023-12-26 DIAGNOSIS — R2681 Unsteadiness on feet: Secondary | ICD-10-CM | POA: Diagnosis not present

## 2024-01-03 DIAGNOSIS — I5032 Chronic diastolic (congestive) heart failure: Secondary | ICD-10-CM | POA: Diagnosis not present

## 2024-01-03 DIAGNOSIS — R531 Weakness: Secondary | ICD-10-CM | POA: Diagnosis not present

## 2024-01-03 DIAGNOSIS — I130 Hypertensive heart and chronic kidney disease with heart failure and stage 1 through stage 4 chronic kidney disease, or unspecified chronic kidney disease: Secondary | ICD-10-CM | POA: Diagnosis not present

## 2024-01-03 DIAGNOSIS — N1832 Chronic kidney disease, stage 3b: Secondary | ICD-10-CM | POA: Diagnosis not present

## 2024-01-24 ENCOUNTER — Encounter (HOSPITAL_BASED_OUTPATIENT_CLINIC_OR_DEPARTMENT_OTHER): Payer: Self-pay

## 2024-01-26 ENCOUNTER — Encounter (HOSPITAL_BASED_OUTPATIENT_CLINIC_OR_DEPARTMENT_OTHER): Payer: Self-pay | Admitting: Cardiology

## 2024-01-26 ENCOUNTER — Ambulatory Visit (HOSPITAL_BASED_OUTPATIENT_CLINIC_OR_DEPARTMENT_OTHER): Admitting: Cardiology

## 2024-01-26 VITALS — BP 108/68 | HR 70 | Ht 59.0 in | Wt 122.2 lb

## 2024-01-26 DIAGNOSIS — I10 Essential (primary) hypertension: Secondary | ICD-10-CM

## 2024-01-26 DIAGNOSIS — Z8673 Personal history of transient ischemic attack (TIA), and cerebral infarction without residual deficits: Secondary | ICD-10-CM

## 2024-01-26 DIAGNOSIS — Z9181 History of falling: Secondary | ICD-10-CM | POA: Diagnosis not present

## 2024-01-26 DIAGNOSIS — R079 Chest pain, unspecified: Secondary | ICD-10-CM

## 2024-01-26 NOTE — Progress Notes (Signed)
 Cardiology Office Note:  .   Date:  01/26/2024  ID:  Valerie Barker, DOB Apr 08, 1922, MRN [id number] PCP: Clarice Nottingham, MD  [facility] Providers Cardiologist:  Shelda Bruckner, MD {  History of Present Illness: .   Valerie Barker is a 89 y.o. female with PMH TIA, history of falls, drug induced bradycardia, hypertension. She was previously followed by Dr. Burnard and established care with me on 01/26/24.  Pertinent CV history: Monitor 2024 with frequent brief pSVT. Echo 11/2022 with EF 60-65%, G1DD, RVSP mildly elevated at 45, aortic valve calcification without stenosis.  Today: Since she last saw Dr. Burnard, she was admitted 06/2023 for rhabdomyolysis after a fall. At that time her hydrochlorothiazide  was stopped, but on her current medication list she is taking hydrochlorothiazide  12.5 mg alternating with hydrochlorothiazide  25 mg every other day. She is also on atorvastatin  and clopidogrel  with her history of TIA.  Here with her aide Sherrilyn. No recent falls. Doesn't walk alone.  She is having intermittent chest pain. Difficult for her to determine how often, but she has SLNG PRN. She often presses on the center of her chest when this happens and she feels better. Lasts about 10 minutes. No associated symptoms. Pain isn't severe but is noticeable. Discussed stress test vs conservative management. Discussed trial of GI meds  ROS: Denies other chest pain, shortness of breath at rest or with normal exertion. No PND, orthopnea, LE edema or unexpected weight gain. No syncope or palpitations. ROS otherwise negative except as noted.   Studies Reviewed: SABRA    EKG:       Physical Exam:   VS:  BP 108/68 (BP Location: Right Arm, Patient Position: Sitting, Cuff Size: Normal)   Pulse 70   Ht 4' 11 (1.499 m)   Wt 122 lb 3.2 oz (55.4 kg)   SpO2 98%   BMI 24.68 kg/m    Wt Readings from Last 3 Encounters:  01/26/24 122 lb 3.2 oz (55.4 kg)  06/22/23 102 lb 15.3 oz (46.7 kg)   05/30/23 121 lb 9.6 oz (55.2 kg)    GEN: Well nourished, well developed in no acute distress HEENT: Normal, moist mucous membranes NECK: No JVD CARDIAC: regular rhythm, normal S1 and S2, no rubs or gallops. 1/6 systolic murmur. VASCULAR: Radial and DP pulses 2+ bilaterally. No carotid bruits RESPIRATORY:  Clear to auscultation without rales, wheezing or rhonchi  ABDOMEN: Soft, non-tender, non-distended MUSCULOSKELETAL:  Ambulates independently SKIN: Warm and dry, no edema NEUROLOGIC:  Alert and oriented x 3. No focal neuro deficits noted. PSYCHIATRIC:  Normal affect    ASSESSMENT AND PLAN: .    Chest pain -atypical components such as changing with external palpation, but cannot completely determine from history/exam alone -discussed trials of OTC meds to determine if there is a GI component -discussed options including monitoring clinically and pursuing cardiac testing. After shared decision making, will hold on testing for now -reviewed red flag warning signs that need immediate medical attention  History of TIA Frequent falls, at risk for future falls -on clopidogrel , statin -denies recent falls but had several in the last year. Avoid hypotension as below  Hypertension -well controlled on current hydrochlorothiazide  pattern (alternating 12.5-15 mg doses by day) -would avoid overtreatment--if she becomes lightheaded would cut back or stop  Dispo: 3 most or sooner as needed  Signed, Shelda Bruckner, MD   Shelda Bruckner, MD, PhD, [facility]  [facility]  [employer]  Heart & Vascular at [facility]  at [facility] [address] [place], [place] [address] [phone]

## 2024-01-26 NOTE — Patient Instructions (Addendum)
 See if burping, or taking something like mylanta or antacid to see if this helps the chest discomfort.  Medication Instructions:  Your physician recommends that you continue on your current medications as directed. Please refer to the Current Medication list given to you today.  *If you need a refill on your cardiac medications before your next appointment, please call your pharmacy*  Lab Work: none If you have labs (blood work) drawn today and your tests are completely normal, you will receive your results only by: MyChart Message (if you have MyChart) OR A paper copy in the mail If you have any lab test that is abnormal or we need to change your treatment, we will call you to review the results.  Testing/Procedures: none  Follow-Up: At [facility], you and your health needs are our priority.  As part of our continuing mission to provide you with exceptional heart care, our providers are all part of one team.  This team includes your primary Cardiologist (physician) and Advanced Practice Providers or APPs (Physician Assistants and Nurse Practitioners) who all work together to provide you with the care you need, when you need it.  Your next appointment:   April 24, 2024 at 1:20  Provider:   Shelda Bruckner, MD    We recommend signing up for the patient portal called MyChart.  Sign up information is provided on this After Visit Summary.  MyChart is used to connect with patients for Virtual Visits (Telemedicine).  Patients are able to view lab/test results, encounter notes, upcoming appointments, etc.  Non-urgent messages can be sent to your provider as well.   To learn more about what you can do with MyChart, go to [email or web address].   Other Instructions

## 2024-01-31 ENCOUNTER — Other Ambulatory Visit: Payer: Self-pay | Admitting: Nurse Practitioner

## 2024-02-04 ENCOUNTER — Encounter (HOSPITAL_BASED_OUTPATIENT_CLINIC_OR_DEPARTMENT_OTHER): Payer: Self-pay | Admitting: Cardiology

## 2024-04-24 ENCOUNTER — Ambulatory Visit (HOSPITAL_BASED_OUTPATIENT_CLINIC_OR_DEPARTMENT_OTHER): Admitting: Cardiology

## 2024-04-24 ENCOUNTER — Encounter (HOSPITAL_BASED_OUTPATIENT_CLINIC_OR_DEPARTMENT_OTHER): Payer: Self-pay | Admitting: Cardiology

## 2024-04-24 VITALS — BP 132/70 | HR 67 | Ht 59.0 in | Wt 126.5 lb

## 2024-04-24 DIAGNOSIS — R0789 Other chest pain: Secondary | ICD-10-CM

## 2024-04-24 DIAGNOSIS — Z8673 Personal history of transient ischemic attack (TIA), and cerebral infarction without residual deficits: Secondary | ICD-10-CM

## 2024-04-24 DIAGNOSIS — I10 Essential (primary) hypertension: Secondary | ICD-10-CM | POA: Diagnosis not present

## 2024-04-24 DIAGNOSIS — Z9181 History of falling: Secondary | ICD-10-CM

## 2024-04-24 NOTE — Patient Instructions (Signed)
 Medication Instructions:  Your physician recommends that you continue on your current medications as directed. Please refer to the Current Medication list given to you today.  *If you need a refill on your cardiac medications before your next appointment, please call your pharmacy*  Lab Work: NONE  Testing/Procedures: NONE  Follow-Up: At [facility], you and your health needs are our priority.  As part of our continuing mission to provide you with exceptional heart care, we have created designated Provider Care Teams.  These Care Teams include your primary Cardiologist (physician) and Advanced Practice Providers (APPs -  Physician Assistants and Nurse Practitioners) who all work together to provide you with the care you need, when you need it.  We recommend signing up for the patient portal called MyChart.  Sign up information is provided on this After Visit Summary.  MyChart is used to connect with patients for Virtual Visits (Telemedicine).  Patients are able to view lab/test results, encounter notes, upcoming appointments, etc.  Non-urgent messages can be sent to your provider as well.   To learn more about what you can do with MyChart, go to [email or web address].    Your next appointment:   6 month(s)  The format for your next appointment:   In Person  Provider:   Dr Lonni, Rosaline RAMAN NP, or Reche ORN NP

## 2024-04-24 NOTE — Progress Notes (Signed)
" °  Cardiology Office Note:  .   Date:  04/24/2024  ID:  Valerie Barker, DOB [birth year], MRN [id number] PCP: Clarice Nottingham, MD  [facility] Providers Cardiologist:  Shelda Bruckner, MD {  History of Present Illness: .   Valerie Barker is a 89 y.o. female with PMH TIA, history of falls, drug induced bradycardia, hypertension. She was previously followed by Dr. Burnard and established care with me on 01/26/24.  Pertinent CV history: Monitor 2024 with frequent brief pSVT. Echo 11/2022 with EF 60-65%, G1DD, RVSP mildly elevated at 45, aortic valve calcification without stenosis.  Today: Here with her aide today. Feels that everything is going well. No falls but doesn't walk unassisted. Hasn't had hardly any chest pain, had an episode about two days ago with mild chest pressure, felt better with pushing on her chest. Reviewed when to use nitroglycerin.  ROS: Denies other chest pain, shortness of breath at rest or with normal exertion. No PND, orthopnea, LE edema or unexpected weight gain. No syncope or palpitations. ROS otherwise negative except as noted.   Studies Reviewed: SABRA    EKG:       Physical Exam:   VS:  BP 132/70   Pulse 67   Ht 4' 11 (1.499 m)   Wt 126 lb 8 oz (57.4 kg)   SpO2 97%   BMI 25.55 kg/m    Wt Readings from Last 3 Encounters:  04/24/24 126 lb 8 oz (57.4 kg)  01/26/24 122 lb 3.2 oz (55.4 kg)  06/22/23 102 lb 15.3 oz (46.7 kg)    GEN: Well nourished, well developed in no acute distress HEENT: Normal, moist mucous membranes NECK: No JVD CARDIAC: regular rhythm with intermittent ectopy, normal S1 and S2, no rubs or gallops. 1/6 systolic murmur. VASCULAR: Radial and DP pulses 2+ bilaterally. No carotid bruits RESPIRATORY:  Clear to auscultation without rales, wheezing or rhonchi  ABDOMEN: Soft, non-tender, non-distended MUSCULOSKELETAL:  In wheelchair, moves all 4 limbs independently. SKIN: Warm and dry, no edema NEUROLOGIC:  Alert and  oriented x 3. No focal neuro deficits noted. PSYCHIATRIC:  Normal affect    ASSESSMENT AND PLAN: .    Chest pain -atypical components such as changing with external palpation, but cannot completely determine from history/exam alone -reviewed when to use nitroglycerin -we have previously discussed testing if symptoms progress. She would like to avoid all testing if possible, unless symptoms become severe. Agree with conservative management given her age. -reviewed red flag warning signs that need immediate medical attention  History of TIA Frequent falls, at risk for future falls -on clopidogrel , statin -denies recent falls. Avoid hypotension as below  Hypertension -well controlled on current hydrochlorothiazide  pattern (alternating 12.5-15 mg doses by day) -would avoid overtreatment--if she becomes lightheaded would cut back or stop  Dispo: 6 mos or sooner as needed  Signed, Shelda Bruckner, MD   Shelda Bruckner, MD, PhD, [facility] [place]  [facility]  [place]  Heart & Vascular at [facility] at [facility] [address], [address] [place], [place] [address] [phone]   "
